# Patient Record
Sex: Male | Born: 1960 | Race: Black or African American | Hispanic: No | Marital: Married | Smoking: Never smoker
Health system: Southern US, Community
[De-identification: ages and names within clinical notes are randomized; demographics above are authoritative.]

## PROBLEM LIST (undated history)

## (undated) DIAGNOSIS — E785 Hyperlipidemia, unspecified: Secondary | ICD-10-CM

## (undated) DIAGNOSIS — R079 Chest pain, unspecified: Secondary | ICD-10-CM

## (undated) DIAGNOSIS — I5032 Chronic diastolic (congestive) heart failure: Secondary | ICD-10-CM

## (undated) DIAGNOSIS — N2581 Secondary hyperparathyroidism of renal origin: Secondary | ICD-10-CM

## (undated) DIAGNOSIS — J189 Pneumonia, unspecified organism: Secondary | ICD-10-CM

## (undated) DIAGNOSIS — I10 Essential (primary) hypertension: Secondary | ICD-10-CM

## (undated) DIAGNOSIS — J8489 Other specified interstitial pulmonary diseases: Secondary | ICD-10-CM

## (undated) DIAGNOSIS — G4733 Obstructive sleep apnea (adult) (pediatric): Secondary | ICD-10-CM

## (undated) DIAGNOSIS — D638 Anemia in other chronic diseases classified elsewhere: Secondary | ICD-10-CM

## (undated) DIAGNOSIS — N186 End stage renal disease: Secondary | ICD-10-CM

## (undated) DIAGNOSIS — E109 Type 1 diabetes mellitus without complications: Secondary | ICD-10-CM

## (undated) HISTORY — PX: OTHER SURGICAL HISTORY: SHX169

## (undated) HISTORY — PX: SHOULDER SURGERY: SHX246

## (undated) HISTORY — DX: Obstructive sleep apnea (adult) (pediatric): G47.33

## (undated) HISTORY — DX: End stage renal disease: N18.6

## (undated) HISTORY — DX: Hyperlipidemia, unspecified: E78.5

## (undated) HISTORY — PX: KNEE SURGERY: SHX244

## (undated) HISTORY — PX: GALLBLADDER SURGERY: SHX652

## (undated) HISTORY — PX: LUNG BIOPSY: SHX232

---

## 2004-04-21 ENCOUNTER — Other Ambulatory Visit: Payer: Self-pay

## 2006-03-08 ENCOUNTER — Emergency Department: Payer: Self-pay | Admitting: Emergency Medicine

## 2007-06-08 ENCOUNTER — Ambulatory Visit: Payer: Self-pay | Admitting: Internal Medicine

## 2007-06-13 ENCOUNTER — Emergency Department: Payer: Self-pay | Admitting: Internal Medicine

## 2007-06-15 ENCOUNTER — Inpatient Hospital Stay: Payer: Self-pay | Admitting: Surgery

## 2007-06-21 ENCOUNTER — Emergency Department: Payer: Self-pay | Admitting: Emergency Medicine

## 2008-06-15 ENCOUNTER — Ambulatory Visit: Payer: Self-pay | Admitting: Gastroenterology

## 2008-06-16 ENCOUNTER — Ambulatory Visit: Payer: Self-pay | Admitting: Gastroenterology

## 2008-12-13 ENCOUNTER — Observation Stay: Payer: Self-pay | Admitting: Internal Medicine

## 2009-01-09 ENCOUNTER — Ambulatory Visit: Payer: Self-pay | Admitting: Orthopedic Surgery

## 2009-12-20 ENCOUNTER — Observation Stay: Payer: Self-pay | Admitting: Internal Medicine

## 2010-09-06 ENCOUNTER — Emergency Department: Payer: Self-pay | Admitting: Emergency Medicine

## 2011-01-23 ENCOUNTER — Ambulatory Visit: Payer: Self-pay | Admitting: Sports Medicine

## 2011-11-25 ENCOUNTER — Ambulatory Visit: Payer: Self-pay | Admitting: Vascular Surgery

## 2011-11-25 LAB — BASIC METABOLIC PANEL
Anion Gap: 5 — ABNORMAL LOW (ref 7–16)
BUN: 54 mg/dL — ABNORMAL HIGH (ref 7–18)
Calcium, Total: 9.3 mg/dL (ref 8.5–10.1)
Co2: 28 mmol/L (ref 21–32)
EGFR (African American): 20 — ABNORMAL LOW
EGFR (Non-African Amer.): 16 — ABNORMAL LOW
Glucose: 239 mg/dL — ABNORMAL HIGH (ref 65–99)
Osmolality: 296 (ref 275–301)
Potassium: 4.7 mmol/L (ref 3.5–5.1)

## 2011-11-25 LAB — CBC
HGB: 12.2 g/dL — ABNORMAL LOW (ref 13.0–18.0)
MCH: 27.6 pg (ref 26.0–34.0)
MCV: 86 fL (ref 80–100)
RBC: 4.43 10*6/uL (ref 4.40–5.90)

## 2011-12-02 ENCOUNTER — Ambulatory Visit: Payer: Self-pay | Admitting: Vascular Surgery

## 2011-12-02 LAB — POTASSIUM: Potassium: 4.1 mmol/L (ref 3.5–5.1)

## 2011-12-07 ENCOUNTER — Emergency Department: Payer: Self-pay | Admitting: *Deleted

## 2011-12-07 LAB — CBC WITH DIFFERENTIAL/PLATELET
Basophil #: 0 10*3/uL (ref 0.0–0.1)
Basophil %: 0.1 %
Eosinophil #: 0.1 10*3/uL (ref 0.0–0.7)
HGB: 11.9 g/dL — ABNORMAL LOW (ref 13.0–18.0)
Lymphocyte %: 9.8 %
MCHC: 32.6 g/dL (ref 32.0–36.0)
MCV: 86 fL (ref 80–100)
Monocyte %: 3.4 %
Neutrophil #: 8.9 10*3/uL — ABNORMAL HIGH (ref 1.4–6.5)
Neutrophil %: 85.3 %
RBC: 4.25 10*6/uL — ABNORMAL LOW (ref 4.40–5.90)
RDW: 17 % — ABNORMAL HIGH (ref 11.5–14.5)

## 2011-12-07 LAB — COMPREHENSIVE METABOLIC PANEL
Alkaline Phosphatase: 145 U/L — ABNORMAL HIGH (ref 50–136)
BUN: 51 mg/dL — ABNORMAL HIGH (ref 7–18)
Bilirubin,Total: 0.2 mg/dL (ref 0.2–1.0)
Chloride: 103 mmol/L (ref 98–107)
Co2: 27 mmol/L (ref 21–32)
EGFR (African American): 23 — ABNORMAL LOW
Potassium: 5.1 mmol/L (ref 3.5–5.1)
SGOT(AST): 23 U/L (ref 15–37)
SGPT (ALT): 26 U/L
Sodium: 140 mmol/L (ref 136–145)
Total Protein: 7.1 g/dL (ref 6.4–8.2)

## 2011-12-13 LAB — CULTURE, BLOOD (SINGLE)

## 2012-05-04 ENCOUNTER — Ambulatory Visit: Payer: Self-pay | Admitting: Vascular Surgery

## 2012-06-11 ENCOUNTER — Ambulatory Visit: Payer: Self-pay | Admitting: Vascular Surgery

## 2012-08-12 ENCOUNTER — Ambulatory Visit: Payer: Self-pay | Admitting: Sports Medicine

## 2012-10-26 ENCOUNTER — Inpatient Hospital Stay: Payer: Self-pay | Admitting: Internal Medicine

## 2012-10-26 LAB — CBC WITH DIFFERENTIAL/PLATELET
Basophil %: 0.9 %
Eosinophil #: 0.2 10*3/uL (ref 0.0–0.7)
Lymphocyte #: 1.7 10*3/uL (ref 1.0–3.6)
Lymphocyte %: 14.7 %
MCH: 29.1 pg (ref 26.0–34.0)
Monocyte %: 3.6 %
Neutrophil #: 9 10*3/uL — ABNORMAL HIGH (ref 1.4–6.5)
RBC: 3.69 10*6/uL — ABNORMAL LOW (ref 4.40–5.90)
WBC: 11.4 10*3/uL — ABNORMAL HIGH (ref 3.8–10.6)

## 2012-10-26 LAB — URINALYSIS, COMPLETE
Bacteria: NONE SEEN
Bilirubin,UR: NEGATIVE
Glucose,UR: >=500 mg/dL (ref 0–75)
Hyaline Cast: 1
Leukocyte Esterase: NEGATIVE
Ph: 6 (ref 4.5–8.0)
RBC,UR: NONE SEEN /HPF (ref 0–5)
Specific Gravity: 1.013 (ref 1.003–1.030)
Squamous Epithelial: NONE SEEN
WBC UR: <1 /HPF (ref 0–5)

## 2012-10-26 LAB — COMPREHENSIVE METABOLIC PANEL
Albumin: 3.7 g/dL (ref 3.4–5.0)
Alkaline Phosphatase: 197 U/L — ABNORMAL HIGH (ref 50–136)
Anion Gap: 13 (ref 7–16)
BUN: 68 mg/dL — ABNORMAL HIGH (ref 7–18)
Co2: 19 mmol/L — ABNORMAL LOW (ref 21–32)
Creatinine: 5.31 mg/dL — ABNORMAL HIGH (ref 0.60–1.30)
Glucose: 778 mg/dL (ref 65–99)
Osmolality: 316 (ref 275–301)
Potassium: 5.8 mmol/L — ABNORMAL HIGH (ref 3.5–5.1)
SGOT(AST): 19 U/L (ref 15–37)
SGPT (ALT): 25 U/L (ref 12–78)

## 2012-10-26 LAB — BASIC METABOLIC PANEL
Anion Gap: 7 (ref 7–16)
BUN: 72 mg/dL — ABNORMAL HIGH (ref 7–18)
BUN: 74 mg/dL — ABNORMAL HIGH (ref 7–18)
Calcium, Total: 9 mg/dL (ref 8.5–10.1)
Calcium, Total: 8.9 mg/dL (ref 8.5–10.1)
Chloride: 105 mmol/L (ref 98–107)
Co2: 19 mmol/L — ABNORMAL LOW (ref 21–32)
Co2: 25 mmol/L (ref 21–32)
Creatinine: 5.14 mg/dL — ABNORMAL HIGH (ref 0.60–1.30)
EGFR (African American): 14 — ABNORMAL LOW
EGFR (African American): 14 — ABNORMAL LOW
EGFR (Non-African Amer.): 12 — ABNORMAL LOW
EGFR (Non-African Amer.): 12 — ABNORMAL LOW
Glucose: 186 mg/dL — ABNORMAL HIGH (ref 65–99)
Potassium: 5.6 mmol/L — ABNORMAL HIGH (ref 3.5–5.1)
Potassium: 4.8 mmol/L (ref 3.5–5.1)
Sodium: 137 mmol/L (ref 136–145)

## 2012-10-26 LAB — LIPASE, BLOOD: Lipase: 114 U/L (ref 73–393)

## 2012-10-27 LAB — BASIC METABOLIC PANEL
Anion Gap: 9 (ref 7–16)
BUN: 73 mg/dL — ABNORMAL HIGH (ref 7–18)
Calcium, Total: 8.6 mg/dL (ref 8.5–10.1)
EGFR (African American): 15 — ABNORMAL LOW
EGFR (Non-African Amer.): 13 — ABNORMAL LOW
Glucose: 227 mg/dL — ABNORMAL HIGH (ref 65–99)
Osmolality: 301 (ref 275–301)
Potassium: 4.8 mmol/L (ref 3.5–5.1)

## 2013-01-18 ENCOUNTER — Ambulatory Visit: Payer: Self-pay | Admitting: Vascular Surgery

## 2013-04-19 ENCOUNTER — Ambulatory Visit: Payer: Self-pay | Admitting: Vascular Surgery

## 2013-10-18 DIAGNOSIS — E119 Type 2 diabetes mellitus without complications: Secondary | ICD-10-CM | POA: Diagnosis not present

## 2013-11-01 DIAGNOSIS — K5289 Other specified noninfective gastroenteritis and colitis: Secondary | ICD-10-CM | POA: Diagnosis not present

## 2013-11-01 DIAGNOSIS — R509 Fever, unspecified: Secondary | ICD-10-CM | POA: Diagnosis not present

## 2013-11-01 DIAGNOSIS — R197 Diarrhea, unspecified: Secondary | ICD-10-CM | POA: Diagnosis not present

## 2013-11-01 DIAGNOSIS — R112 Nausea with vomiting, unspecified: Secondary | ICD-10-CM | POA: Diagnosis not present

## 2013-11-10 DIAGNOSIS — R439 Unspecified disturbances of smell and taste: Secondary | ICD-10-CM | POA: Diagnosis not present

## 2013-11-10 DIAGNOSIS — E669 Obesity, unspecified: Secondary | ICD-10-CM | POA: Diagnosis not present

## 2013-11-10 DIAGNOSIS — R262 Difficulty in walking, not elsewhere classified: Secondary | ICD-10-CM | POA: Diagnosis not present

## 2013-11-10 DIAGNOSIS — R51 Headache: Secondary | ICD-10-CM | POA: Diagnosis not present

## 2013-12-10 ENCOUNTER — Emergency Department: Payer: Self-pay | Admitting: Emergency Medicine

## 2013-12-10 DIAGNOSIS — G4730 Sleep apnea, unspecified: Secondary | ICD-10-CM | POA: Diagnosis not present

## 2013-12-10 DIAGNOSIS — I10 Essential (primary) hypertension: Secondary | ICD-10-CM | POA: Diagnosis not present

## 2013-12-10 DIAGNOSIS — N189 Chronic kidney disease, unspecified: Secondary | ICD-10-CM | POA: Diagnosis not present

## 2013-12-10 DIAGNOSIS — Z9889 Other specified postprocedural states: Secondary | ICD-10-CM | POA: Diagnosis not present

## 2013-12-10 DIAGNOSIS — Z794 Long term (current) use of insulin: Secondary | ICD-10-CM | POA: Diagnosis not present

## 2013-12-10 DIAGNOSIS — E1142 Type 2 diabetes mellitus with diabetic polyneuropathy: Secondary | ICD-10-CM | POA: Diagnosis not present

## 2013-12-10 DIAGNOSIS — I509 Heart failure, unspecified: Secondary | ICD-10-CM | POA: Diagnosis not present

## 2013-12-10 DIAGNOSIS — J029 Acute pharyngitis, unspecified: Secondary | ICD-10-CM | POA: Diagnosis not present

## 2013-12-10 DIAGNOSIS — Z8249 Family history of ischemic heart disease and other diseases of the circulatory system: Secondary | ICD-10-CM | POA: Diagnosis not present

## 2013-12-10 DIAGNOSIS — I2510 Atherosclerotic heart disease of native coronary artery without angina pectoris: Secondary | ICD-10-CM | POA: Diagnosis not present

## 2013-12-10 DIAGNOSIS — J019 Acute sinusitis, unspecified: Secondary | ICD-10-CM | POA: Diagnosis not present

## 2013-12-10 DIAGNOSIS — R509 Fever, unspecified: Secondary | ICD-10-CM | POA: Diagnosis not present

## 2013-12-10 DIAGNOSIS — Z833 Family history of diabetes mellitus: Secondary | ICD-10-CM | POA: Diagnosis not present

## 2013-12-10 DIAGNOSIS — E1149 Type 2 diabetes mellitus with other diabetic neurological complication: Secondary | ICD-10-CM | POA: Diagnosis not present

## 2013-12-10 DIAGNOSIS — Z9089 Acquired absence of other organs: Secondary | ICD-10-CM | POA: Diagnosis not present

## 2013-12-11 ENCOUNTER — Emergency Department: Payer: Self-pay | Admitting: Emergency Medicine

## 2013-12-11 DIAGNOSIS — Z992 Dependence on renal dialysis: Secondary | ICD-10-CM | POA: Diagnosis not present

## 2013-12-11 DIAGNOSIS — J029 Acute pharyngitis, unspecified: Secondary | ICD-10-CM | POA: Diagnosis not present

## 2013-12-11 DIAGNOSIS — I2510 Atherosclerotic heart disease of native coronary artery without angina pectoris: Secondary | ICD-10-CM | POA: Diagnosis not present

## 2013-12-11 DIAGNOSIS — E119 Type 2 diabetes mellitus without complications: Secondary | ICD-10-CM | POA: Diagnosis not present

## 2013-12-11 DIAGNOSIS — I10 Essential (primary) hypertension: Secondary | ICD-10-CM | POA: Diagnosis not present

## 2013-12-11 DIAGNOSIS — N186 End stage renal disease: Secondary | ICD-10-CM | POA: Diagnosis not present

## 2013-12-11 LAB — COMPREHENSIVE METABOLIC PANEL
ALT: 170 U/L — AB (ref 12–78)
AST: 159 U/L — AB (ref 15–37)
Albumin: 2.5 g/dL — ABNORMAL LOW (ref 3.4–5.0)
Alkaline Phosphatase: 308 U/L — ABNORMAL HIGH
Anion Gap: 10 (ref 7–16)
BUN: 38 mg/dL — AB (ref 7–18)
Bilirubin,Total: 1.1 mg/dL — ABNORMAL HIGH (ref 0.2–1.0)
CALCIUM: 8.6 mg/dL (ref 8.5–10.1)
CO2: 28 mmol/L (ref 21–32)
Chloride: 93 mmol/L — ABNORMAL LOW (ref 98–107)
Creatinine: 7.35 mg/dL — ABNORMAL HIGH (ref 0.60–1.30)
EGFR (Non-African Amer.): 8 — ABNORMAL LOW
GFR CALC AF AMER: 9 — AB
GLUCOSE: 375 mg/dL — AB (ref 65–99)
OSMOLALITY: 287 (ref 275–301)
Potassium: 4.2 mmol/L (ref 3.5–5.1)
Sodium: 131 mmol/L — ABNORMAL LOW (ref 136–145)
Total Protein: 7 g/dL (ref 6.4–8.2)

## 2013-12-11 LAB — CBC WITH DIFFERENTIAL/PLATELET
BASOS ABS: 0.1 10*3/uL (ref 0.0–0.1)
Basophil %: 0.5 %
EOS PCT: 0 %
Eosinophil #: 0 10*3/uL (ref 0.0–0.7)
HCT: 31.8 % — AB (ref 40.0–52.0)
HGB: 10.3 g/dL — AB (ref 13.0–18.0)
LYMPHS PCT: 2.3 %
Lymphocyte #: 0.6 10*3/uL — ABNORMAL LOW (ref 1.0–3.6)
MCH: 28.5 pg (ref 26.0–34.0)
MCHC: 32.3 g/dL (ref 32.0–36.0)
MCV: 88 fL (ref 80–100)
Monocyte #: 1.8 x10 3/mm — ABNORMAL HIGH (ref 0.2–1.0)
Monocyte %: 7.1 %
NEUTROS ABS: 22.7 10*3/uL — AB (ref 1.4–6.5)
NEUTROS PCT: 90.1 %
Platelet: 303 10*3/uL (ref 150–440)
RBC: 3.61 10*6/uL — ABNORMAL LOW (ref 4.40–5.90)
RDW: 16.5 % — ABNORMAL HIGH (ref 11.5–14.5)
WBC: 25.2 10*3/uL — ABNORMAL HIGH (ref 3.8–10.6)

## 2013-12-12 ENCOUNTER — Inpatient Hospital Stay: Payer: Self-pay | Admitting: Internal Medicine

## 2013-12-12 DIAGNOSIS — E559 Vitamin D deficiency, unspecified: Secondary | ICD-10-CM | POA: Diagnosis present

## 2013-12-12 DIAGNOSIS — R7881 Bacteremia: Secondary | ICD-10-CM | POA: Diagnosis not present

## 2013-12-12 DIAGNOSIS — H409 Unspecified glaucoma: Secondary | ICD-10-CM | POA: Diagnosis present

## 2013-12-12 DIAGNOSIS — T827XXA Infection and inflammatory reaction due to other cardiac and vascular devices, implants and grafts, initial encounter: Secondary | ICD-10-CM | POA: Diagnosis present

## 2013-12-12 DIAGNOSIS — N186 End stage renal disease: Secondary | ICD-10-CM | POA: Diagnosis present

## 2013-12-12 DIAGNOSIS — R079 Chest pain, unspecified: Secondary | ICD-10-CM | POA: Diagnosis not present

## 2013-12-12 DIAGNOSIS — R5383 Other fatigue: Secondary | ICD-10-CM | POA: Diagnosis not present

## 2013-12-12 DIAGNOSIS — E1142 Type 2 diabetes mellitus with diabetic polyneuropathy: Secondary | ICD-10-CM | POA: Diagnosis present

## 2013-12-12 DIAGNOSIS — E11319 Type 2 diabetes mellitus with unspecified diabetic retinopathy without macular edema: Secondary | ICD-10-CM | POA: Diagnosis present

## 2013-12-12 DIAGNOSIS — R5381 Other malaise: Secondary | ICD-10-CM | POA: Diagnosis not present

## 2013-12-12 DIAGNOSIS — F329 Major depressive disorder, single episode, unspecified: Secondary | ICD-10-CM | POA: Diagnosis present

## 2013-12-12 DIAGNOSIS — A4102 Sepsis due to Methicillin resistant Staphylococcus aureus: Secondary | ICD-10-CM | POA: Diagnosis present

## 2013-12-12 DIAGNOSIS — I509 Heart failure, unspecified: Secondary | ICD-10-CM | POA: Diagnosis present

## 2013-12-12 DIAGNOSIS — F3289 Other specified depressive episodes: Secondary | ICD-10-CM | POA: Diagnosis present

## 2013-12-12 DIAGNOSIS — R509 Fever, unspecified: Secondary | ICD-10-CM | POA: Diagnosis not present

## 2013-12-12 DIAGNOSIS — J392 Other diseases of pharynx: Secondary | ICD-10-CM | POA: Diagnosis not present

## 2013-12-12 DIAGNOSIS — G4733 Obstructive sleep apnea (adult) (pediatric): Secondary | ICD-10-CM | POA: Diagnosis present

## 2013-12-12 DIAGNOSIS — J984 Other disorders of lung: Secondary | ICD-10-CM | POA: Diagnosis not present

## 2013-12-12 DIAGNOSIS — I5030 Unspecified diastolic (congestive) heart failure: Secondary | ICD-10-CM | POA: Diagnosis present

## 2013-12-12 DIAGNOSIS — F411 Generalized anxiety disorder: Secondary | ICD-10-CM | POA: Diagnosis present

## 2013-12-12 DIAGNOSIS — I2510 Atherosclerotic heart disease of native coronary artery without angina pectoris: Secondary | ICD-10-CM | POA: Diagnosis present

## 2013-12-12 DIAGNOSIS — J390 Retropharyngeal and parapharyngeal abscess: Secondary | ICD-10-CM | POA: Diagnosis present

## 2013-12-12 DIAGNOSIS — Z538 Procedure and treatment not carried out for other reasons: Secondary | ICD-10-CM | POA: Diagnosis not present

## 2013-12-12 DIAGNOSIS — K208 Other esophagitis without bleeding: Secondary | ICD-10-CM | POA: Diagnosis not present

## 2013-12-12 DIAGNOSIS — K219 Gastro-esophageal reflux disease without esophagitis: Secondary | ICD-10-CM | POA: Diagnosis present

## 2013-12-12 DIAGNOSIS — A419 Sepsis, unspecified organism: Secondary | ICD-10-CM | POA: Diagnosis not present

## 2013-12-12 DIAGNOSIS — R6520 Severe sepsis without septic shock: Secondary | ICD-10-CM | POA: Diagnosis present

## 2013-12-12 DIAGNOSIS — R404 Transient alteration of awareness: Secondary | ICD-10-CM | POA: Diagnosis not present

## 2013-12-12 DIAGNOSIS — M109 Gout, unspecified: Secondary | ICD-10-CM | POA: Diagnosis present

## 2013-12-12 DIAGNOSIS — M542 Cervicalgia: Secondary | ICD-10-CM | POA: Diagnosis not present

## 2013-12-12 DIAGNOSIS — I120 Hypertensive chronic kidney disease with stage 5 chronic kidney disease or end stage renal disease: Secondary | ICD-10-CM | POA: Diagnosis present

## 2013-12-12 DIAGNOSIS — F40298 Other specified phobia: Secondary | ICD-10-CM | POA: Diagnosis present

## 2013-12-12 DIAGNOSIS — G9341 Metabolic encephalopathy: Secondary | ICD-10-CM | POA: Diagnosis not present

## 2013-12-12 DIAGNOSIS — E785 Hyperlipidemia, unspecified: Secondary | ICD-10-CM | POA: Diagnosis present

## 2013-12-12 DIAGNOSIS — J029 Acute pharyngitis, unspecified: Secondary | ICD-10-CM | POA: Diagnosis not present

## 2013-12-12 DIAGNOSIS — R1310 Dysphagia, unspecified: Secondary | ICD-10-CM | POA: Diagnosis not present

## 2013-12-12 DIAGNOSIS — E039 Hypothyroidism, unspecified: Secondary | ICD-10-CM | POA: Diagnosis present

## 2013-12-12 DIAGNOSIS — R112 Nausea with vomiting, unspecified: Secondary | ICD-10-CM | POA: Diagnosis not present

## 2013-12-12 DIAGNOSIS — T8571XA Infection and inflammatory reaction due to peritoneal dialysis catheter, initial encounter: Secondary | ICD-10-CM | POA: Diagnosis not present

## 2013-12-12 LAB — COMPREHENSIVE METABOLIC PANEL
ALK PHOS: 282 U/L — AB
Albumin: 2.3 g/dL — ABNORMAL LOW (ref 3.4–5.0)
Anion Gap: 6 — ABNORMAL LOW (ref 7–16)
BILIRUBIN TOTAL: 0.5 mg/dL (ref 0.2–1.0)
BUN: 28 mg/dL — ABNORMAL HIGH (ref 7–18)
Calcium, Total: 9 mg/dL (ref 8.5–10.1)
Chloride: 96 mmol/L — ABNORMAL LOW (ref 98–107)
Co2: 33 mmol/L — ABNORMAL HIGH (ref 21–32)
Creatinine: 5.03 mg/dL — ABNORMAL HIGH (ref 0.60–1.30)
EGFR (African American): 14 — ABNORMAL LOW
GFR CALC NON AF AMER: 12 — AB
Glucose: 284 mg/dL — ABNORMAL HIGH (ref 65–99)
Osmolality: 286 (ref 275–301)
POTASSIUM: 3.8 mmol/L (ref 3.5–5.1)
SGOT(AST): 48 U/L — ABNORMAL HIGH (ref 15–37)
SGPT (ALT): 121 U/L — ABNORMAL HIGH (ref 12–78)
SODIUM: 135 mmol/L — AB (ref 136–145)
Total Protein: 7.5 g/dL (ref 6.4–8.2)

## 2013-12-12 LAB — URINALYSIS, COMPLETE
Bilirubin,UR: NEGATIVE
Granular Cast: 6
Ketone: NEGATIVE
LEUKOCYTE ESTERASE: NEGATIVE
NITRITE: NEGATIVE
PH: 5 (ref 4.5–8.0)
Protein: >=500
RBC,UR: 8 /HPF (ref 0–5)
Specific Gravity: 1.029 (ref 1.003–1.030)
Squamous Epithelial: NONE SEEN

## 2013-12-12 LAB — CBC
HCT: 31.6 % — ABNORMAL LOW (ref 40.0–52.0)
HGB: 10.4 g/dL — AB (ref 13.0–18.0)
MCH: 28.8 pg (ref 26.0–34.0)
MCHC: 33 g/dL (ref 32.0–36.0)
MCV: 87 fL (ref 80–100)
PLATELETS: 308 10*3/uL (ref 150–440)
RBC: 3.62 10*6/uL — ABNORMAL LOW (ref 4.40–5.90)
RDW: 17.3 % — ABNORMAL HIGH (ref 11.5–14.5)
WBC: 32.1 10*3/uL — AB (ref 3.8–10.6)

## 2013-12-13 LAB — BASIC METABOLIC PANEL
Anion Gap: 12 (ref 7–16)
BUN: 55 mg/dL — AB (ref 7–18)
CALCIUM: 8.7 mg/dL (ref 8.5–10.1)
CO2: 27 mmol/L (ref 21–32)
CREATININE: 7.73 mg/dL — AB (ref 0.60–1.30)
Chloride: 91 mmol/L — ABNORMAL LOW (ref 98–107)
EGFR (African American): 9 — ABNORMAL LOW
EGFR (Non-African Amer.): 7 — ABNORMAL LOW
Glucose: 498 mg/dL — ABNORMAL HIGH (ref 65–99)
OSMOLALITY: 298 (ref 275–301)
Potassium: 4.7 mmol/L (ref 3.5–5.1)
Sodium: 130 mmol/L — ABNORMAL LOW (ref 136–145)

## 2013-12-13 LAB — CBC WITH DIFFERENTIAL/PLATELET
Basophil #: 0.1 10*3/uL (ref 0.0–0.1)
Basophil %: 0.3 %
EOS ABS: 0 10*3/uL (ref 0.0–0.7)
EOS PCT: 0 %
HCT: 29.7 % — AB (ref 40.0–52.0)
HGB: 9.7 g/dL — ABNORMAL LOW (ref 13.0–18.0)
Lymphocyte #: 0.9 10*3/uL — ABNORMAL LOW (ref 1.0–3.6)
Lymphocyte %: 2.8 %
MCH: 29 pg (ref 26.0–34.0)
MCHC: 32.5 g/dL (ref 32.0–36.0)
MCV: 89 fL (ref 80–100)
MONOS PCT: 4.7 %
Monocyte #: 1.5 x10 3/mm — ABNORMAL HIGH (ref 0.2–1.0)
NEUTROS ABS: 29.3 10*3/uL — AB (ref 1.4–6.5)
Neutrophil %: 92.2 %
Platelet: 329 10*3/uL (ref 150–440)
RBC: 3.33 10*6/uL — AB (ref 4.40–5.90)
RDW: 16.7 % — ABNORMAL HIGH (ref 11.5–14.5)
WBC: 31.7 10*3/uL — ABNORMAL HIGH (ref 3.8–10.6)

## 2013-12-13 LAB — BETA STREP CULTURE(ARMC)

## 2013-12-14 DIAGNOSIS — R079 Chest pain, unspecified: Secondary | ICD-10-CM

## 2013-12-14 LAB — CBC WITH DIFFERENTIAL/PLATELET
Basophil #: 0.1 10*3/uL (ref 0.0–0.1)
Basophil %: 0.4 %
EOS PCT: 0.3 %
Eosinophil #: 0 10*3/uL (ref 0.0–0.7)
HCT: 36.3 % — ABNORMAL LOW (ref 40.0–52.0)
HGB: 12 g/dL — ABNORMAL LOW (ref 13.0–18.0)
LYMPHS ABS: 1 10*3/uL (ref 1.0–3.6)
LYMPHS PCT: 5.5 %
MCH: 27.6 pg (ref 26.0–34.0)
MCHC: 33 g/dL (ref 32.0–36.0)
MCV: 84 fL (ref 80–100)
MONO ABS: 1.8 x10 3/mm — AB (ref 0.2–1.0)
MONOS PCT: 9.9 %
NEUTROS ABS: 15 10*3/uL — AB (ref 1.4–6.5)
Neutrophil %: 83.9 %
Platelet: 265 10*3/uL (ref 150–440)
RBC: 4.34 10*6/uL — AB (ref 4.40–5.90)
RDW: 15.6 % — ABNORMAL HIGH (ref 11.5–14.5)
WBC: 17.9 10*3/uL — ABNORMAL HIGH (ref 3.8–10.6)

## 2013-12-14 LAB — URINE CULTURE

## 2013-12-14 LAB — PHOSPHORUS: Phosphorus: 6.7 mg/dL — ABNORMAL HIGH (ref 2.5–4.9)

## 2013-12-14 LAB — TROPONIN I: Troponin-I: 0.09 ng/mL — ABNORMAL HIGH

## 2013-12-15 LAB — CBC WITH DIFFERENTIAL/PLATELET
BASOS ABS: 0 10*3/uL (ref 0.0–0.1)
Basophil %: 0.2 %
Eosinophil #: 0 10*3/uL (ref 0.0–0.7)
Eosinophil %: 0.2 %
HCT: 31 % — ABNORMAL LOW (ref 40.0–52.0)
HGB: 10.2 g/dL — AB (ref 13.0–18.0)
LYMPHS PCT: 7.8 %
Lymphocyte #: 1.6 10*3/uL (ref 1.0–3.6)
MCH: 28.9 pg (ref 26.0–34.0)
MCHC: 32.8 g/dL (ref 32.0–36.0)
MCV: 88 fL (ref 80–100)
MONO ABS: 2.3 x10 3/mm — AB (ref 0.2–1.0)
MONOS PCT: 11.2 %
Neutrophil #: 16.7 10*3/uL — ABNORMAL HIGH (ref 1.4–6.5)
Neutrophil %: 80.6 %
Platelet: 386 10*3/uL (ref 150–440)
RBC: 3.51 10*6/uL — ABNORMAL LOW (ref 4.40–5.90)
RDW: 16.9 % — AB (ref 11.5–14.5)
WBC: 20.8 10*3/uL — ABNORMAL HIGH (ref 3.8–10.6)

## 2013-12-15 LAB — PHOSPHORUS: Phosphorus: 7.1 mg/dL — ABNORMAL HIGH (ref 2.5–4.9)

## 2013-12-16 LAB — CBC WITH DIFFERENTIAL/PLATELET
BANDS NEUTROPHIL: 2 %
Comment - H1-Com4: NORMAL
Eosinophil: 1 %
HCT: 30.1 % — AB (ref 40.0–52.0)
HGB: 9.8 g/dL — ABNORMAL LOW (ref 13.0–18.0)
Lymphocytes: 16 %
MCH: 29.2 pg (ref 26.0–34.0)
MCHC: 32.7 g/dL (ref 32.0–36.0)
MCV: 89 fL (ref 80–100)
MONOS PCT: 12 %
Metamyelocyte: 1 %
Myelocyte: 1 %
NRBC/100 WBC: 1 /
PLATELETS: 381 10*3/uL (ref 150–440)
RBC: 3.37 10*6/uL — AB (ref 4.40–5.90)
RDW: 16.9 % — AB (ref 11.5–14.5)
Segmented Neutrophils: 67 %
WBC: 19.3 10*3/uL — ABNORMAL HIGH (ref 3.8–10.6)

## 2013-12-16 LAB — CULTURE, BLOOD (SINGLE)

## 2013-12-16 LAB — PHOSPHORUS: Phosphorus: 6.9 mg/dL — ABNORMAL HIGH (ref 2.5–4.9)

## 2013-12-17 LAB — CULTURE, BLOOD (SINGLE)

## 2013-12-18 LAB — CBC WITH DIFFERENTIAL/PLATELET
BASOS ABS: 0.1 10*3/uL (ref 0.0–0.1)
BASOS PCT: 0.5 %
Eosinophil #: 0.4 10*3/uL (ref 0.0–0.7)
Eosinophil %: 2.3 %
HCT: 30.2 % — ABNORMAL LOW (ref 40.0–52.0)
HGB: 9.6 g/dL — ABNORMAL LOW (ref 13.0–18.0)
LYMPHS ABS: 2.1 10*3/uL (ref 1.0–3.6)
Lymphocyte %: 11.6 %
MCH: 28.2 pg (ref 26.0–34.0)
MCHC: 31.8 g/dL — AB (ref 32.0–36.0)
MCV: 89 fL (ref 80–100)
Monocyte #: 1.5 x10 3/mm — ABNORMAL HIGH (ref 0.2–1.0)
Monocyte %: 8.3 %
NEUTROS ABS: 14 10*3/uL — AB (ref 1.4–6.5)
Neutrophil %: 77.3 %
Platelet: 520 10*3/uL — ABNORMAL HIGH (ref 150–440)
RBC: 3.4 10*6/uL — ABNORMAL LOW (ref 4.40–5.90)
RDW: 17.1 % — AB (ref 11.5–14.5)
WBC: 18.1 10*3/uL — ABNORMAL HIGH (ref 3.8–10.6)

## 2013-12-18 LAB — VANCOMYCIN, TROUGH: Vancomycin, Trough: 13 ug/mL (ref 10–20)

## 2013-12-19 LAB — RENAL FUNCTION PANEL
Albumin: 2 g/dL — ABNORMAL LOW (ref 3.4–5.0)
Anion Gap: 12 (ref 7–16)
BUN: 74 mg/dL — ABNORMAL HIGH (ref 7–18)
CHLORIDE: 94 mmol/L — AB (ref 98–107)
CREATININE: 11.76 mg/dL — AB (ref 0.60–1.30)
Calcium, Total: 8.8 mg/dL (ref 8.5–10.1)
Co2: 28 mmol/L (ref 21–32)
GFR CALC AF AMER: 5 — AB
GFR CALC NON AF AMER: 4 — AB
Glucose: 277 mg/dL — ABNORMAL HIGH (ref 65–99)
Osmolality: 300 (ref 275–301)
Phosphorus: 7.9 mg/dL — ABNORMAL HIGH (ref 2.5–4.9)
Potassium: 3.9 mmol/L (ref 3.5–5.1)
SODIUM: 134 mmol/L — AB (ref 136–145)

## 2013-12-19 LAB — CBC WITH DIFFERENTIAL/PLATELET
BASOS ABS: 0.1 10*3/uL (ref 0.0–0.1)
Basophil %: 0.4 %
Eosinophil #: 0.3 10*3/uL (ref 0.0–0.7)
Eosinophil %: 1.9 %
HCT: 27.9 % — ABNORMAL LOW (ref 40.0–52.0)
HGB: 9.3 g/dL — ABNORMAL LOW (ref 13.0–18.0)
LYMPHS PCT: 9.2 %
Lymphocyte #: 1.2 10*3/uL (ref 1.0–3.6)
MCH: 29.5 pg (ref 26.0–34.0)
MCHC: 33.5 g/dL (ref 32.0–36.0)
MCV: 88 fL (ref 80–100)
Monocyte #: 0.9 x10 3/mm (ref 0.2–1.0)
Monocyte %: 6.6 %
NEUTROS ABS: 10.6 10*3/uL — AB (ref 1.4–6.5)
Neutrophil %: 81.9 %
Platelet: 521 10*3/uL — ABNORMAL HIGH (ref 150–440)
RBC: 3.17 10*6/uL — ABNORMAL LOW (ref 4.40–5.90)
RDW: 16.5 % — AB (ref 11.5–14.5)
WBC: 13 10*3/uL — ABNORMAL HIGH (ref 3.8–10.6)

## 2013-12-19 LAB — TSH: Thyroid Stimulating Horm: 0.017 u[IU]/mL — ABNORMAL LOW

## 2013-12-20 LAB — CBC WITH DIFFERENTIAL/PLATELET
BASOS PCT: 0.7 %
Basophil #: 0.1 10*3/uL (ref 0.0–0.1)
Eosinophil #: 0.2 10*3/uL (ref 0.0–0.7)
Eosinophil %: 1.5 %
HCT: 30.8 % — AB (ref 40.0–52.0)
HGB: 9.5 g/dL — ABNORMAL LOW (ref 13.0–18.0)
Lymphocyte #: 1.4 10*3/uL (ref 1.0–3.6)
Lymphocyte %: 10.7 %
MCH: 27.5 pg (ref 26.0–34.0)
MCHC: 31 g/dL — AB (ref 32.0–36.0)
MCV: 89 fL (ref 80–100)
MONO ABS: 0.9 x10 3/mm (ref 0.2–1.0)
Monocyte %: 6.9 %
NEUTROS PCT: 80.2 %
Neutrophil #: 10.7 10*3/uL — ABNORMAL HIGH (ref 1.4–6.5)
PLATELETS: 589 10*3/uL — AB (ref 150–440)
RBC: 3.47 10*6/uL — AB (ref 4.40–5.90)
RDW: 17 % — AB (ref 11.5–14.5)
WBC: 13.3 10*3/uL — AB (ref 3.8–10.6)

## 2013-12-20 LAB — COMPREHENSIVE METABOLIC PANEL
ALK PHOS: 293 U/L — AB
AST: 29 U/L (ref 15–37)
Albumin: 2 g/dL — ABNORMAL LOW (ref 3.4–5.0)
Anion Gap: 10 (ref 7–16)
BUN: 40 mg/dL — ABNORMAL HIGH (ref 7–18)
Bilirubin,Total: 0.5 mg/dL (ref 0.2–1.0)
Calcium, Total: 8.9 mg/dL (ref 8.5–10.1)
Chloride: 99 mmol/L (ref 98–107)
Co2: 29 mmol/L (ref 21–32)
Creatinine: 8.5 mg/dL — ABNORMAL HIGH (ref 0.60–1.30)
EGFR (African American): 8 — ABNORMAL LOW
EGFR (Non-African Amer.): 7 — ABNORMAL LOW
GLUCOSE: 207 mg/dL — AB (ref 65–99)
OSMOLALITY: 291 (ref 275–301)
Potassium: 3.9 mmol/L (ref 3.5–5.1)
SGPT (ALT): 29 U/L (ref 12–78)
SODIUM: 138 mmol/L (ref 136–145)
TOTAL PROTEIN: 7.6 g/dL (ref 6.4–8.2)

## 2013-12-20 LAB — MISC AER/ANAEROBIC CULT.

## 2013-12-20 LAB — PROTIME-INR
INR: 1.1
Prothrombin Time: 13.6 secs (ref 11.5–14.7)

## 2013-12-21 LAB — PHOSPHORUS: Phosphorus: 8.3 mg/dL — ABNORMAL HIGH (ref 2.5–4.9)

## 2013-12-21 LAB — VANCOMYCIN, TROUGH: Vancomycin, Trough: 25 ug/mL (ref 10–20)

## 2013-12-22 LAB — CBC WITH DIFFERENTIAL/PLATELET
BASOS PCT: 1 %
Basophil #: 0.1 10*3/uL (ref 0.0–0.1)
EOS PCT: 1.6 %
Eosinophil #: 0.2 10*3/uL (ref 0.0–0.7)
HCT: 28.6 % — ABNORMAL LOW (ref 40.0–52.0)
HGB: 9.2 g/dL — AB (ref 13.0–18.0)
LYMPHS ABS: 1.5 10*3/uL (ref 1.0–3.6)
LYMPHS PCT: 12.6 %
MCH: 29 pg (ref 26.0–34.0)
MCHC: 32.2 g/dL (ref 32.0–36.0)
MCV: 90 fL (ref 80–100)
MONO ABS: 1 x10 3/mm (ref 0.2–1.0)
MONOS PCT: 8.3 %
Neutrophil #: 8.9 10*3/uL — ABNORMAL HIGH (ref 1.4–6.5)
Neutrophil %: 76.5 %
Platelet: 507 10*3/uL — ABNORMAL HIGH (ref 150–440)
RBC: 3.18 10*6/uL — ABNORMAL LOW (ref 4.40–5.90)
RDW: 16.3 % — ABNORMAL HIGH (ref 11.5–14.5)
WBC: 11.7 10*3/uL — ABNORMAL HIGH (ref 3.8–10.6)

## 2013-12-23 DIAGNOSIS — M542 Cervicalgia: Secondary | ICD-10-CM | POA: Diagnosis not present

## 2013-12-23 DIAGNOSIS — J390 Retropharyngeal and parapharyngeal abscess: Secondary | ICD-10-CM | POA: Diagnosis not present

## 2013-12-23 DIAGNOSIS — R112 Nausea with vomiting, unspecified: Secondary | ICD-10-CM | POA: Diagnosis not present

## 2013-12-23 DIAGNOSIS — R5381 Other malaise: Secondary | ICD-10-CM | POA: Diagnosis not present

## 2013-12-23 DIAGNOSIS — R509 Fever, unspecified: Secondary | ICD-10-CM | POA: Diagnosis not present

## 2013-12-23 LAB — POTASSIUM: Potassium: 3.5 mmol/L (ref 3.5–5.1)

## 2013-12-24 DIAGNOSIS — E1010 Type 1 diabetes mellitus with ketoacidosis without coma: Secondary | ICD-10-CM | POA: Diagnosis not present

## 2013-12-24 DIAGNOSIS — Z452 Encounter for adjustment and management of vascular access device: Secondary | ICD-10-CM | POA: Diagnosis not present

## 2013-12-24 DIAGNOSIS — J390 Retropharyngeal and parapharyngeal abscess: Secondary | ICD-10-CM | POA: Diagnosis not present

## 2013-12-24 DIAGNOSIS — E109 Type 1 diabetes mellitus without complications: Secondary | ICD-10-CM | POA: Diagnosis not present

## 2013-12-24 DIAGNOSIS — R7881 Bacteremia: Secondary | ICD-10-CM | POA: Diagnosis not present

## 2013-12-24 DIAGNOSIS — I517 Cardiomegaly: Secondary | ICD-10-CM | POA: Diagnosis not present

## 2013-12-24 DIAGNOSIS — R918 Other nonspecific abnormal finding of lung field: Secondary | ICD-10-CM | POA: Diagnosis not present

## 2013-12-25 DIAGNOSIS — R7881 Bacteremia: Secondary | ICD-10-CM | POA: Diagnosis not present

## 2013-12-25 DIAGNOSIS — I517 Cardiomegaly: Secondary | ICD-10-CM | POA: Diagnosis not present

## 2013-12-25 DIAGNOSIS — Z452 Encounter for adjustment and management of vascular access device: Secondary | ICD-10-CM | POA: Diagnosis not present

## 2013-12-25 DIAGNOSIS — E1010 Type 1 diabetes mellitus with ketoacidosis without coma: Secondary | ICD-10-CM | POA: Diagnosis not present

## 2013-12-25 DIAGNOSIS — R918 Other nonspecific abnormal finding of lung field: Secondary | ICD-10-CM | POA: Diagnosis not present

## 2013-12-25 DIAGNOSIS — E109 Type 1 diabetes mellitus without complications: Secondary | ICD-10-CM | POA: Diagnosis not present

## 2013-12-25 DIAGNOSIS — J390 Retropharyngeal and parapharyngeal abscess: Secondary | ICD-10-CM | POA: Diagnosis not present

## 2013-12-26 DIAGNOSIS — N186 End stage renal disease: Secondary | ICD-10-CM | POA: Diagnosis not present

## 2013-12-26 DIAGNOSIS — E109 Type 1 diabetes mellitus without complications: Secondary | ICD-10-CM | POA: Diagnosis not present

## 2013-12-26 DIAGNOSIS — A4902 Methicillin resistant Staphylococcus aureus infection, unspecified site: Secondary | ICD-10-CM | POA: Diagnosis not present

## 2013-12-26 DIAGNOSIS — N184 Chronic kidney disease, stage 4 (severe): Secondary | ICD-10-CM | POA: Diagnosis not present

## 2013-12-26 DIAGNOSIS — J390 Retropharyngeal and parapharyngeal abscess: Secondary | ICD-10-CM | POA: Diagnosis not present

## 2013-12-26 DIAGNOSIS — R7881 Bacteremia: Secondary | ICD-10-CM | POA: Diagnosis not present

## 2013-12-27 DIAGNOSIS — N184 Chronic kidney disease, stage 4 (severe): Secondary | ICD-10-CM | POA: Diagnosis not present

## 2013-12-27 DIAGNOSIS — Z992 Dependence on renal dialysis: Secondary | ICD-10-CM | POA: Diagnosis not present

## 2013-12-27 DIAGNOSIS — J390 Retropharyngeal and parapharyngeal abscess: Secondary | ICD-10-CM | POA: Diagnosis not present

## 2013-12-27 DIAGNOSIS — E1010 Type 1 diabetes mellitus with ketoacidosis without coma: Secondary | ICD-10-CM | POA: Diagnosis not present

## 2013-12-27 DIAGNOSIS — R1312 Dysphagia, oropharyngeal phase: Secondary | ICD-10-CM | POA: Diagnosis not present

## 2013-12-27 DIAGNOSIS — N186 End stage renal disease: Secondary | ICD-10-CM | POA: Diagnosis not present

## 2013-12-27 DIAGNOSIS — R7881 Bacteremia: Secondary | ICD-10-CM | POA: Diagnosis not present

## 2013-12-27 DIAGNOSIS — E109 Type 1 diabetes mellitus without complications: Secondary | ICD-10-CM | POA: Diagnosis not present

## 2013-12-28 DIAGNOSIS — A4902 Methicillin resistant Staphylococcus aureus infection, unspecified site: Secondary | ICD-10-CM | POA: Diagnosis not present

## 2013-12-28 DIAGNOSIS — R1312 Dysphagia, oropharyngeal phase: Secondary | ICD-10-CM | POA: Diagnosis not present

## 2013-12-28 DIAGNOSIS — R0989 Other specified symptoms and signs involving the circulatory and respiratory systems: Secondary | ICD-10-CM | POA: Diagnosis not present

## 2013-12-28 DIAGNOSIS — I10 Essential (primary) hypertension: Secondary | ICD-10-CM | POA: Diagnosis not present

## 2013-12-28 DIAGNOSIS — E109 Type 1 diabetes mellitus without complications: Secondary | ICD-10-CM | POA: Diagnosis not present

## 2013-12-28 DIAGNOSIS — R0609 Other forms of dyspnea: Secondary | ICD-10-CM | POA: Diagnosis not present

## 2013-12-28 DIAGNOSIS — R7881 Bacteremia: Secondary | ICD-10-CM | POA: Diagnosis not present

## 2013-12-28 DIAGNOSIS — J390 Retropharyngeal and parapharyngeal abscess: Secondary | ICD-10-CM | POA: Diagnosis not present

## 2013-12-28 DIAGNOSIS — N186 End stage renal disease: Secondary | ICD-10-CM | POA: Diagnosis not present

## 2013-12-29 DIAGNOSIS — R0609 Other forms of dyspnea: Secondary | ICD-10-CM | POA: Diagnosis not present

## 2013-12-29 DIAGNOSIS — A4902 Methicillin resistant Staphylococcus aureus infection, unspecified site: Secondary | ICD-10-CM | POA: Diagnosis not present

## 2013-12-29 DIAGNOSIS — R7881 Bacteremia: Secondary | ICD-10-CM | POA: Diagnosis not present

## 2013-12-29 DIAGNOSIS — E109 Type 1 diabetes mellitus without complications: Secondary | ICD-10-CM | POA: Diagnosis not present

## 2013-12-29 DIAGNOSIS — J390 Retropharyngeal and parapharyngeal abscess: Secondary | ICD-10-CM | POA: Diagnosis not present

## 2013-12-29 DIAGNOSIS — N186 End stage renal disease: Secondary | ICD-10-CM | POA: Diagnosis not present

## 2013-12-29 DIAGNOSIS — I10 Essential (primary) hypertension: Secondary | ICD-10-CM | POA: Diagnosis not present

## 2013-12-30 DIAGNOSIS — R7881 Bacteremia: Secondary | ICD-10-CM | POA: Diagnosis not present

## 2013-12-30 DIAGNOSIS — A4902 Methicillin resistant Staphylococcus aureus infection, unspecified site: Secondary | ICD-10-CM | POA: Diagnosis not present

## 2013-12-30 DIAGNOSIS — R0609 Other forms of dyspnea: Secondary | ICD-10-CM | POA: Diagnosis not present

## 2013-12-30 DIAGNOSIS — N186 End stage renal disease: Secondary | ICD-10-CM | POA: Diagnosis not present

## 2013-12-30 DIAGNOSIS — J392 Other diseases of pharynx: Secondary | ICD-10-CM | POA: Diagnosis not present

## 2013-12-30 DIAGNOSIS — I10 Essential (primary) hypertension: Secondary | ICD-10-CM | POA: Diagnosis not present

## 2013-12-31 DIAGNOSIS — J390 Retropharyngeal and parapharyngeal abscess: Secondary | ICD-10-CM | POA: Diagnosis not present

## 2013-12-31 DIAGNOSIS — N186 End stage renal disease: Secondary | ICD-10-CM | POA: Diagnosis not present

## 2013-12-31 DIAGNOSIS — E109 Type 1 diabetes mellitus without complications: Secondary | ICD-10-CM | POA: Diagnosis not present

## 2013-12-31 DIAGNOSIS — R7881 Bacteremia: Secondary | ICD-10-CM | POA: Diagnosis not present

## 2013-12-31 DIAGNOSIS — I10 Essential (primary) hypertension: Secondary | ICD-10-CM | POA: Diagnosis not present

## 2013-12-31 DIAGNOSIS — R0609 Other forms of dyspnea: Secondary | ICD-10-CM | POA: Diagnosis not present

## 2013-12-31 DIAGNOSIS — A4902 Methicillin resistant Staphylococcus aureus infection, unspecified site: Secondary | ICD-10-CM | POA: Diagnosis not present

## 2014-01-01 DIAGNOSIS — N186 End stage renal disease: Secondary | ICD-10-CM | POA: Diagnosis not present

## 2014-01-01 DIAGNOSIS — I10 Essential (primary) hypertension: Secondary | ICD-10-CM | POA: Diagnosis not present

## 2014-01-01 DIAGNOSIS — J390 Retropharyngeal and parapharyngeal abscess: Secondary | ICD-10-CM | POA: Diagnosis not present

## 2014-01-01 DIAGNOSIS — E109 Type 1 diabetes mellitus without complications: Secondary | ICD-10-CM | POA: Diagnosis not present

## 2014-01-02 DIAGNOSIS — I10 Essential (primary) hypertension: Secondary | ICD-10-CM | POA: Diagnosis not present

## 2014-01-02 DIAGNOSIS — E109 Type 1 diabetes mellitus without complications: Secondary | ICD-10-CM | POA: Diagnosis not present

## 2014-01-02 DIAGNOSIS — J390 Retropharyngeal and parapharyngeal abscess: Secondary | ICD-10-CM | POA: Diagnosis not present

## 2014-01-02 DIAGNOSIS — N186 End stage renal disease: Secondary | ICD-10-CM | POA: Diagnosis not present

## 2014-01-03 DIAGNOSIS — J390 Retropharyngeal and parapharyngeal abscess: Secondary | ICD-10-CM | POA: Diagnosis not present

## 2014-01-03 DIAGNOSIS — I10 Essential (primary) hypertension: Secondary | ICD-10-CM | POA: Diagnosis not present

## 2014-01-03 DIAGNOSIS — N186 End stage renal disease: Secondary | ICD-10-CM | POA: Diagnosis not present

## 2014-01-03 DIAGNOSIS — E109 Type 1 diabetes mellitus without complications: Secondary | ICD-10-CM | POA: Diagnosis not present

## 2014-01-09 DIAGNOSIS — Z4889 Encounter for other specified surgical aftercare: Secondary | ICD-10-CM | POA: Diagnosis not present

## 2014-01-09 DIAGNOSIS — I10 Essential (primary) hypertension: Secondary | ICD-10-CM | POA: Diagnosis not present

## 2014-01-09 DIAGNOSIS — N186 End stage renal disease: Secondary | ICD-10-CM | POA: Diagnosis not present

## 2014-01-09 DIAGNOSIS — N184 Chronic kidney disease, stage 4 (severe): Secondary | ICD-10-CM | POA: Diagnosis not present

## 2014-01-09 DIAGNOSIS — I2690 Septic pulmonary embolism without acute cor pulmonale: Secondary | ICD-10-CM | POA: Diagnosis not present

## 2014-01-09 DIAGNOSIS — J390 Retropharyngeal and parapharyngeal abscess: Secondary | ICD-10-CM | POA: Diagnosis not present

## 2014-01-09 DIAGNOSIS — E109 Type 1 diabetes mellitus without complications: Secondary | ICD-10-CM | POA: Diagnosis not present

## 2014-01-09 DIAGNOSIS — A4902 Methicillin resistant Staphylococcus aureus infection, unspecified site: Secondary | ICD-10-CM | POA: Diagnosis not present

## 2014-01-24 DIAGNOSIS — J329 Chronic sinusitis, unspecified: Secondary | ICD-10-CM | POA: Diagnosis not present

## 2014-02-07 DIAGNOSIS — N186 End stage renal disease: Secondary | ICD-10-CM | POA: Diagnosis not present

## 2014-02-07 DIAGNOSIS — I10 Essential (primary) hypertension: Secondary | ICD-10-CM | POA: Diagnosis not present

## 2014-02-07 DIAGNOSIS — E109 Type 1 diabetes mellitus without complications: Secondary | ICD-10-CM | POA: Diagnosis not present

## 2014-02-14 DIAGNOSIS — R9431 Abnormal electrocardiogram [ECG] [EKG]: Secondary | ICD-10-CM | POA: Diagnosis not present

## 2014-02-14 DIAGNOSIS — E1142 Type 2 diabetes mellitus with diabetic polyneuropathy: Secondary | ICD-10-CM | POA: Diagnosis not present

## 2014-02-14 DIAGNOSIS — Z713 Dietary counseling and surveillance: Secondary | ICD-10-CM | POA: Diagnosis not present

## 2014-02-14 DIAGNOSIS — N186 End stage renal disease: Secondary | ICD-10-CM | POA: Diagnosis not present

## 2014-02-14 DIAGNOSIS — E1049 Type 1 diabetes mellitus with other diabetic neurological complication: Secondary | ICD-10-CM | POA: Diagnosis not present

## 2014-02-14 DIAGNOSIS — Z992 Dependence on renal dialysis: Secondary | ICD-10-CM | POA: Diagnosis not present

## 2014-02-14 DIAGNOSIS — E1029 Type 1 diabetes mellitus with other diabetic kidney complication: Secondary | ICD-10-CM | POA: Diagnosis not present

## 2014-02-14 DIAGNOSIS — R0789 Other chest pain: Secondary | ICD-10-CM | POA: Diagnosis not present

## 2014-02-14 DIAGNOSIS — Z79899 Other long term (current) drug therapy: Secondary | ICD-10-CM | POA: Diagnosis not present

## 2014-02-14 DIAGNOSIS — E1065 Type 1 diabetes mellitus with hyperglycemia: Secondary | ICD-10-CM | POA: Diagnosis not present

## 2014-02-14 DIAGNOSIS — E119 Type 2 diabetes mellitus without complications: Secondary | ICD-10-CM | POA: Diagnosis not present

## 2014-02-14 DIAGNOSIS — N058 Unspecified nephritic syndrome with other morphologic changes: Secondary | ICD-10-CM | POA: Diagnosis not present

## 2014-02-28 ENCOUNTER — Ambulatory Visit: Payer: Self-pay | Admitting: Vascular Surgery

## 2014-03-10 DIAGNOSIS — E11311 Type 2 diabetes mellitus with unspecified diabetic retinopathy with macular edema: Secondary | ICD-10-CM | POA: Diagnosis not present

## 2014-03-10 DIAGNOSIS — E1139 Type 2 diabetes mellitus with other diabetic ophthalmic complication: Secondary | ICD-10-CM | POA: Diagnosis not present

## 2014-03-10 DIAGNOSIS — H35379 Puckering of macula, unspecified eye: Secondary | ICD-10-CM | POA: Diagnosis not present

## 2014-03-10 DIAGNOSIS — E11359 Type 2 diabetes mellitus with proliferative diabetic retinopathy without macular edema: Secondary | ICD-10-CM | POA: Diagnosis not present

## 2014-03-14 ENCOUNTER — Ambulatory Visit: Payer: Self-pay | Admitting: Vascular Surgery

## 2014-03-24 DIAGNOSIS — E11311 Type 2 diabetes mellitus with unspecified diabetic retinopathy with macular edema: Secondary | ICD-10-CM | POA: Diagnosis not present

## 2014-03-24 DIAGNOSIS — Z961 Presence of intraocular lens: Secondary | ICD-10-CM | POA: Diagnosis not present

## 2014-03-24 DIAGNOSIS — E11359 Type 2 diabetes mellitus with proliferative diabetic retinopathy without macular edema: Secondary | ICD-10-CM | POA: Diagnosis not present

## 2014-03-24 DIAGNOSIS — E1139 Type 2 diabetes mellitus with other diabetic ophthalmic complication: Secondary | ICD-10-CM | POA: Diagnosis not present

## 2014-04-06 DIAGNOSIS — M542 Cervicalgia: Secondary | ICD-10-CM | POA: Diagnosis not present

## 2014-04-06 DIAGNOSIS — E039 Hypothyroidism, unspecified: Secondary | ICD-10-CM | POA: Diagnosis not present

## 2014-04-06 DIAGNOSIS — E109 Type 1 diabetes mellitus without complications: Secondary | ICD-10-CM | POA: Diagnosis not present

## 2014-05-16 DIAGNOSIS — M542 Cervicalgia: Secondary | ICD-10-CM | POA: Diagnosis not present

## 2014-05-25 DIAGNOSIS — N058 Unspecified nephritic syndrome with other morphologic changes: Secondary | ICD-10-CM | POA: Diagnosis not present

## 2014-05-25 DIAGNOSIS — E1029 Type 1 diabetes mellitus with other diabetic kidney complication: Secondary | ICD-10-CM | POA: Diagnosis not present

## 2014-06-22 DIAGNOSIS — E1029 Type 1 diabetes mellitus with other diabetic kidney complication: Secondary | ICD-10-CM | POA: Diagnosis not present

## 2014-06-22 DIAGNOSIS — R946 Abnormal results of thyroid function studies: Secondary | ICD-10-CM | POA: Diagnosis not present

## 2014-07-11 DIAGNOSIS — I10 Essential (primary) hypertension: Secondary | ICD-10-CM | POA: Diagnosis not present

## 2014-07-11 DIAGNOSIS — E119 Type 2 diabetes mellitus without complications: Secondary | ICD-10-CM | POA: Diagnosis not present

## 2014-07-11 DIAGNOSIS — E785 Hyperlipidemia, unspecified: Secondary | ICD-10-CM | POA: Diagnosis not present

## 2014-07-13 DIAGNOSIS — F329 Major depressive disorder, single episode, unspecified: Secondary | ICD-10-CM | POA: Diagnosis not present

## 2014-07-13 DIAGNOSIS — E8779 Other fluid overload: Secondary | ICD-10-CM | POA: Diagnosis not present

## 2014-07-13 DIAGNOSIS — F410 Panic disorder [episodic paroxysmal anxiety] without agoraphobia: Secondary | ICD-10-CM | POA: Diagnosis not present

## 2014-07-13 DIAGNOSIS — F54 Psychological and behavioral factors associated with disorders or diseases classified elsewhere: Secondary | ICD-10-CM | POA: Diagnosis not present

## 2014-07-13 DIAGNOSIS — Z01818 Encounter for other preprocedural examination: Secondary | ICD-10-CM | POA: Diagnosis not present

## 2014-07-13 DIAGNOSIS — Z008 Encounter for other general examination: Secondary | ICD-10-CM | POA: Diagnosis not present

## 2014-07-13 DIAGNOSIS — Z01810 Encounter for preprocedural cardiovascular examination: Secondary | ICD-10-CM | POA: Diagnosis not present

## 2014-08-22 DIAGNOSIS — N186 End stage renal disease: Secondary | ICD-10-CM | POA: Diagnosis not present

## 2014-08-22 DIAGNOSIS — I509 Heart failure, unspecified: Secondary | ICD-10-CM | POA: Diagnosis not present

## 2014-08-22 DIAGNOSIS — I150 Renovascular hypertension: Secondary | ICD-10-CM | POA: Diagnosis not present

## 2014-08-22 DIAGNOSIS — E1021 Type 1 diabetes mellitus with diabetic nephropathy: Secondary | ICD-10-CM | POA: Diagnosis not present

## 2014-08-24 DIAGNOSIS — R319 Hematuria, unspecified: Secondary | ICD-10-CM | POA: Diagnosis not present

## 2014-08-24 DIAGNOSIS — N529 Male erectile dysfunction, unspecified: Secondary | ICD-10-CM | POA: Diagnosis not present

## 2014-08-24 DIAGNOSIS — Z125 Encounter for screening for malignant neoplasm of prostate: Secondary | ICD-10-CM | POA: Diagnosis not present

## 2014-08-31 DIAGNOSIS — R319 Hematuria, unspecified: Secondary | ICD-10-CM | POA: Diagnosis not present

## 2014-08-31 DIAGNOSIS — N529 Male erectile dysfunction, unspecified: Secondary | ICD-10-CM | POA: Diagnosis not present

## 2014-08-31 DIAGNOSIS — E669 Obesity, unspecified: Secondary | ICD-10-CM | POA: Diagnosis not present

## 2014-08-31 DIAGNOSIS — E663 Overweight: Secondary | ICD-10-CM | POA: Diagnosis not present

## 2014-08-31 DIAGNOSIS — I10 Essential (primary) hypertension: Secondary | ICD-10-CM | POA: Diagnosis not present

## 2014-09-21 DIAGNOSIS — E1021 Type 1 diabetes mellitus with diabetic nephropathy: Secondary | ICD-10-CM | POA: Diagnosis not present

## 2014-09-21 DIAGNOSIS — I150 Renovascular hypertension: Secondary | ICD-10-CM | POA: Diagnosis not present

## 2014-09-21 DIAGNOSIS — F411 Generalized anxiety disorder: Secondary | ICD-10-CM | POA: Diagnosis not present

## 2014-09-21 DIAGNOSIS — F331 Major depressive disorder, recurrent, moderate: Secondary | ICD-10-CM | POA: Diagnosis not present

## 2014-09-21 DIAGNOSIS — I120 Hypertensive chronic kidney disease with stage 5 chronic kidney disease or end stage renal disease: Secondary | ICD-10-CM | POA: Diagnosis not present

## 2014-09-21 DIAGNOSIS — N186 End stage renal disease: Secondary | ICD-10-CM | POA: Diagnosis not present

## 2014-09-21 DIAGNOSIS — T827XXA Infection and inflammatory reaction due to other cardiac and vascular devices, implants and grafts, initial encounter: Secondary | ICD-10-CM | POA: Diagnosis not present

## 2014-10-26 DIAGNOSIS — E669 Obesity, unspecified: Secondary | ICD-10-CM | POA: Diagnosis not present

## 2014-10-26 DIAGNOSIS — E785 Hyperlipidemia, unspecified: Secondary | ICD-10-CM | POA: Diagnosis not present

## 2014-10-26 DIAGNOSIS — T859XXA Unspecified complication of internal prosthetic device, implant and graft, initial encounter: Secondary | ICD-10-CM | POA: Diagnosis not present

## 2014-10-26 DIAGNOSIS — Y841 Kidney dialysis as the cause of abnormal reaction of the patient, or of later complication, without mention of misadventure at the time of the procedure: Secondary | ICD-10-CM | POA: Diagnosis not present

## 2014-10-26 DIAGNOSIS — N186 End stage renal disease: Secondary | ICD-10-CM | POA: Diagnosis not present

## 2014-10-26 DIAGNOSIS — I10 Essential (primary) hypertension: Secondary | ICD-10-CM | POA: Diagnosis not present

## 2014-11-19 DIAGNOSIS — R05 Cough: Secondary | ICD-10-CM | POA: Diagnosis not present

## 2014-11-19 DIAGNOSIS — N186 End stage renal disease: Secondary | ICD-10-CM | POA: Diagnosis not present

## 2014-11-19 DIAGNOSIS — E118 Type 2 diabetes mellitus with unspecified complications: Secondary | ICD-10-CM | POA: Diagnosis not present

## 2014-11-19 DIAGNOSIS — J209 Acute bronchitis, unspecified: Secondary | ICD-10-CM | POA: Diagnosis not present

## 2014-11-26 DIAGNOSIS — G4730 Sleep apnea, unspecified: Secondary | ICD-10-CM | POA: Diagnosis present

## 2014-11-26 DIAGNOSIS — I5032 Chronic diastolic (congestive) heart failure: Secondary | ICD-10-CM | POA: Diagnosis not present

## 2014-11-26 DIAGNOSIS — E785 Hyperlipidemia, unspecified: Secondary | ICD-10-CM | POA: Diagnosis present

## 2014-11-26 DIAGNOSIS — R140 Abdominal distension (gaseous): Secondary | ICD-10-CM | POA: Diagnosis not present

## 2014-11-26 DIAGNOSIS — G629 Polyneuropathy, unspecified: Secondary | ICD-10-CM | POA: Diagnosis present

## 2014-11-26 DIAGNOSIS — R918 Other nonspecific abnormal finding of lung field: Secondary | ICD-10-CM | POA: Diagnosis not present

## 2014-11-26 DIAGNOSIS — M109 Gout, unspecified: Secondary | ICD-10-CM | POA: Diagnosis present

## 2014-11-26 DIAGNOSIS — E1021 Type 1 diabetes mellitus with diabetic nephropathy: Secondary | ICD-10-CM | POA: Diagnosis present

## 2014-11-26 DIAGNOSIS — D649 Anemia, unspecified: Secondary | ICD-10-CM | POA: Diagnosis present

## 2014-11-26 DIAGNOSIS — K219 Gastro-esophageal reflux disease without esophagitis: Secondary | ICD-10-CM | POA: Diagnosis present

## 2014-11-26 DIAGNOSIS — E1042 Type 1 diabetes mellitus with diabetic polyneuropathy: Secondary | ICD-10-CM | POA: Diagnosis present

## 2014-11-26 DIAGNOSIS — I120 Hypertensive chronic kidney disease with stage 5 chronic kidney disease or end stage renal disease: Secondary | ICD-10-CM | POA: Diagnosis present

## 2014-11-26 DIAGNOSIS — I272 Other secondary pulmonary hypertension: Secondary | ICD-10-CM | POA: Diagnosis present

## 2014-11-26 DIAGNOSIS — R05 Cough: Secondary | ICD-10-CM | POA: Diagnosis not present

## 2014-11-26 DIAGNOSIS — Z794 Long term (current) use of insulin: Secondary | ICD-10-CM | POA: Diagnosis not present

## 2014-11-26 DIAGNOSIS — N529 Male erectile dysfunction, unspecified: Secondary | ICD-10-CM | POA: Diagnosis present

## 2014-11-26 DIAGNOSIS — Z992 Dependence on renal dialysis: Secondary | ICD-10-CM | POA: Diagnosis not present

## 2014-11-26 DIAGNOSIS — R0602 Shortness of breath: Secondary | ICD-10-CM | POA: Diagnosis not present

## 2014-11-26 DIAGNOSIS — J189 Pneumonia, unspecified organism: Secondary | ICD-10-CM | POA: Diagnosis not present

## 2014-11-26 DIAGNOSIS — E10359 Type 1 diabetes mellitus with proliferative diabetic retinopathy without macular edema: Secondary | ICD-10-CM | POA: Diagnosis present

## 2014-11-26 DIAGNOSIS — N186 End stage renal disease: Secondary | ICD-10-CM | POA: Diagnosis present

## 2014-12-11 ENCOUNTER — Emergency Department: Payer: Self-pay | Admitting: Emergency Medicine

## 2014-12-11 DIAGNOSIS — A084 Viral intestinal infection, unspecified: Secondary | ICD-10-CM | POA: Diagnosis not present

## 2014-12-11 DIAGNOSIS — E119 Type 2 diabetes mellitus without complications: Secondary | ICD-10-CM | POA: Diagnosis not present

## 2014-12-11 DIAGNOSIS — I10 Essential (primary) hypertension: Secondary | ICD-10-CM | POA: Diagnosis not present

## 2014-12-12 ENCOUNTER — Inpatient Hospital Stay: Payer: Self-pay | Admitting: Internal Medicine

## 2014-12-12 ENCOUNTER — Encounter: Payer: Self-pay | Admitting: Internal Medicine

## 2014-12-12 DIAGNOSIS — R1310 Dysphagia, unspecified: Secondary | ICD-10-CM | POA: Diagnosis present

## 2014-12-12 DIAGNOSIS — I776 Arteritis, unspecified: Secondary | ICD-10-CM | POA: Diagnosis present

## 2014-12-12 DIAGNOSIS — E1142 Type 2 diabetes mellitus with diabetic polyneuropathy: Secondary | ICD-10-CM | POA: Diagnosis present

## 2014-12-12 DIAGNOSIS — I517 Cardiomegaly: Secondary | ICD-10-CM | POA: Diagnosis not present

## 2014-12-12 DIAGNOSIS — Z8249 Family history of ischemic heart disease and other diseases of the circulatory system: Secondary | ICD-10-CM | POA: Diagnosis not present

## 2014-12-12 DIAGNOSIS — E785 Hyperlipidemia, unspecified: Secondary | ICD-10-CM | POA: Diagnosis present

## 2014-12-12 DIAGNOSIS — N2581 Secondary hyperparathyroidism of renal origin: Secondary | ICD-10-CM | POA: Diagnosis present

## 2014-12-12 DIAGNOSIS — I2510 Atherosclerotic heart disease of native coronary artery without angina pectoris: Secondary | ICD-10-CM | POA: Diagnosis present

## 2014-12-12 DIAGNOSIS — I5032 Chronic diastolic (congestive) heart failure: Secondary | ICD-10-CM | POA: Diagnosis present

## 2014-12-12 DIAGNOSIS — E11319 Type 2 diabetes mellitus with unspecified diabetic retinopathy without macular edema: Secondary | ICD-10-CM | POA: Diagnosis present

## 2014-12-12 DIAGNOSIS — Z992 Dependence on renal dialysis: Secondary | ICD-10-CM | POA: Diagnosis not present

## 2014-12-12 DIAGNOSIS — Z6838 Body mass index (BMI) 38.0-38.9, adult: Secondary | ICD-10-CM | POA: Diagnosis not present

## 2014-12-12 DIAGNOSIS — G4733 Obstructive sleep apnea (adult) (pediatric): Secondary | ICD-10-CM | POA: Diagnosis present

## 2014-12-12 DIAGNOSIS — Z7982 Long term (current) use of aspirin: Secondary | ICD-10-CM | POA: Diagnosis not present

## 2014-12-12 DIAGNOSIS — R197 Diarrhea, unspecified: Secondary | ICD-10-CM | POA: Diagnosis not present

## 2014-12-12 DIAGNOSIS — E11649 Type 2 diabetes mellitus with hypoglycemia without coma: Secondary | ICD-10-CM | POA: Diagnosis present

## 2014-12-12 DIAGNOSIS — J189 Pneumonia, unspecified organism: Secondary | ICD-10-CM | POA: Diagnosis not present

## 2014-12-12 DIAGNOSIS — K219 Gastro-esophageal reflux disease without esophagitis: Secondary | ICD-10-CM | POA: Diagnosis present

## 2014-12-12 DIAGNOSIS — E46 Unspecified protein-calorie malnutrition: Secondary | ICD-10-CM | POA: Diagnosis present

## 2014-12-12 DIAGNOSIS — F329 Major depressive disorder, single episode, unspecified: Secondary | ICD-10-CM | POA: Diagnosis present

## 2014-12-12 DIAGNOSIS — E039 Hypothyroidism, unspecified: Secondary | ICD-10-CM | POA: Diagnosis present

## 2014-12-12 DIAGNOSIS — R112 Nausea with vomiting, unspecified: Secondary | ICD-10-CM | POA: Diagnosis not present

## 2014-12-12 DIAGNOSIS — I120 Hypertensive chronic kidney disease with stage 5 chronic kidney disease or end stage renal disease: Secondary | ICD-10-CM | POA: Diagnosis not present

## 2014-12-12 DIAGNOSIS — N186 End stage renal disease: Secondary | ICD-10-CM | POA: Diagnosis not present

## 2014-12-12 DIAGNOSIS — Z794 Long term (current) use of insulin: Secondary | ICD-10-CM | POA: Diagnosis not present

## 2014-12-12 DIAGNOSIS — R0602 Shortness of breath: Secondary | ICD-10-CM | POA: Diagnosis not present

## 2014-12-13 DIAGNOSIS — I517 Cardiomegaly: Secondary | ICD-10-CM

## 2014-12-14 ENCOUNTER — Encounter: Payer: Self-pay | Admitting: Internal Medicine

## 2014-12-20 ENCOUNTER — Encounter: Payer: Self-pay | Admitting: Internal Medicine

## 2014-12-26 DIAGNOSIS — E1021 Type 1 diabetes mellitus with diabetic nephropathy: Secondary | ICD-10-CM | POA: Diagnosis not present

## 2014-12-26 DIAGNOSIS — I509 Heart failure, unspecified: Secondary | ICD-10-CM | POA: Diagnosis not present

## 2014-12-26 DIAGNOSIS — F331 Major depressive disorder, recurrent, moderate: Secondary | ICD-10-CM | POA: Diagnosis not present

## 2014-12-26 DIAGNOSIS — J189 Pneumonia, unspecified organism: Secondary | ICD-10-CM | POA: Diagnosis not present

## 2015-01-02 ENCOUNTER — Encounter: Payer: Self-pay | Admitting: Internal Medicine

## 2015-01-02 DIAGNOSIS — F331 Major depressive disorder, recurrent, moderate: Secondary | ICD-10-CM | POA: Diagnosis not present

## 2015-01-07 ENCOUNTER — Inpatient Hospital Stay
Admit: 2015-01-07 | Disposition: A | Discharge status: Home / Self Care | Payer: Self-pay | Attending: Internal Medicine | Admitting: Internal Medicine

## 2015-01-07 LAB — CBC
HCT: 32.2 % — ABNORMAL LOW (ref 40.0–52.0)
HGB: 10.3 g/dL — ABNORMAL LOW (ref 13.0–18.0)
MCH: 28 pg (ref 26.0–34.0)
MCHC: 32 g/dL (ref 32.0–36.0)
MCV: 88 fL (ref 80–100)
PLATELETS: 381 10*3/uL (ref 150–440)
RBC: 3.68 10*6/uL — ABNORMAL LOW (ref 4.40–5.90)
RDW: 17.3 % — AB (ref 11.5–14.5)
WBC: 9.6 10*3/uL (ref 3.8–10.6)

## 2015-01-07 LAB — BASIC METABOLIC PANEL
Anion Gap: 9 (ref 7–16)
BUN: 58 mg/dL — AB
CREATININE: 8.02 mg/dL — AB
Calcium, Total: 8.2 mg/dL — ABNORMAL LOW
Chloride: 96 mmol/L — ABNORMAL LOW
Co2: 27 mmol/L
EGFR (Non-African Amer.): 7 — ABNORMAL LOW
GFR CALC AF AMER: 8 — AB
Glucose: 555 mg/dL
Potassium: 4.9 mmol/L
SODIUM: 132 mmol/L — AB

## 2015-01-07 LAB — TROPONIN I: TROPONIN-I: 0.03 ng/mL

## 2015-01-08 LAB — LIPID PANEL
Cholesterol: 124 mg/dL
HDL Cholesterol: 49 mg/dL
Ldl Cholesterol, Calc: 42 mg/dL
Triglycerides: 167 mg/dL — ABNORMAL HIGH
VLDL Cholesterol, Calc: 33 mg/dL

## 2015-01-08 LAB — HEMOGLOBIN A1C: Hemoglobin A1C: 7.8 % — ABNORMAL HIGH

## 2015-01-08 LAB — TSH: Thyroid Stimulating Horm: 6.226 u[IU]/mL — ABNORMAL HIGH

## 2015-01-10 LAB — VANCOMYCIN, TROUGH: Vancomycin, Trough: 11 ug/mL

## 2015-01-10 LAB — PHOSPHORUS: Phosphorus: 5.6 mg/dL — ABNORMAL HIGH

## 2015-01-10 LAB — POTASSIUM: Potassium: 4.9 mmol/L

## 2015-01-10 LAB — HEMOGLOBIN: HGB: 9.2 g/dL — ABNORMAL LOW (ref 13.0–18.0)

## 2015-01-11 LAB — PLATELET COUNT: Platelet: 247 10*3/uL (ref 150–440)

## 2015-01-12 LAB — CULTURE, BLOOD (SINGLE)

## 2015-01-13 DIAGNOSIS — G4733 Obstructive sleep apnea (adult) (pediatric): Secondary | ICD-10-CM | POA: Diagnosis not present

## 2015-01-13 LAB — CULTURE, FUNGUS WITHOUT SMEAR

## 2015-01-15 ENCOUNTER — Ambulatory Visit
Admit: 2015-01-15 | Disposition: A | Discharge status: Home / Self Care | Payer: Self-pay | Attending: Cardiothoracic Surgery | Admitting: Cardiothoracic Surgery

## 2015-01-15 ENCOUNTER — Ambulatory Visit (INDEPENDENT_AMBULATORY_CARE_PROVIDER_SITE_OTHER): Payer: BC Managed Care – PPO | Admitting: Internal Medicine

## 2015-01-15 ENCOUNTER — Encounter: Payer: Self-pay | Admitting: Internal Medicine

## 2015-01-15 VITALS — BP 118/60 | HR 80 | Temp 98.7°F | Ht 74.0 in | Wt 295.0 lb

## 2015-01-15 DIAGNOSIS — J189 Pneumonia, unspecified organism: Secondary | ICD-10-CM | POA: Insufficient documentation

## 2015-01-15 DIAGNOSIS — R0600 Dyspnea, unspecified: Secondary | ICD-10-CM

## 2015-01-15 DIAGNOSIS — G4733 Obstructive sleep apnea (adult) (pediatric): Secondary | ICD-10-CM

## 2015-01-15 NOTE — Progress Notes (Signed)
Date: 01/15/2015  MRN# [id number] Ryan Arnold 25-Dec-1960  Referring Physician: hospital followup PMD - Dr. Natale Milch [phone], [phone])  Ryan Arnold is a 54 y.o. old male seen in consultation for hospital follow up of recurrent pneumonia  CC:  Chief Complaint  Ryan Arnold presents with  . Advice Only    Hosp f/u recurrent pneumonia. Pt was last d/c on 02/10/15, Pt says he has non-productive cough, wheezing,sob.Pt is on 2L prn 02. no chest tightness  synopsis: 54 year old male past medical history of end-stage renal disease on dialysis, uncontrolled diabetes, hypertension, former retired Emergency planning/management officer, seen in consultation for recurrent pneumonias. Multiple hospitalizations at [facility] and at [facility] over the past one year for bilateral groundglass opacities throughout entire lung fields requiring multiple rounds of antibiotics and steroids. Had a bronchoscopy done at [facility] and within the last 2 years no acute findings from the BAL. Ryan Arnold with recent bronchoscopy, March 2016, at [facility] by Dr. Dema Severin, BAL  with no acute findings.  HPI:  Ryan Arnold is a pleasant 53 year old male presents today for hospital followup. Ryan Arnold has a past medical history of end stage renal disease on dialysis, diabetes, hypertension, renal osteodystrophy, diastolic heart failure. Ryan Arnold was seen in the hospital for recurrent pneumonias from March 1 to March 7, and during that time he had a bronchoscopy with BAL. Given his recurrent bouts of pneumonia over the past one year and being seen at [facility] and by [facility] over the past one year with noted ilateral groundglass opacities throughout the entire lung fields on chest CT and he was further evaluated by Dr. Thelma Barge ([facility]) for surgical lung biopsy, however given the Ryan Arnold was improving Ryan Arnold declined further surgical lung biopsy at that time. See below for full details from the hospital consult note on 12/13/2014.  During this hospitalization Ryan Arnold also  had a vasculitis panel which was negative. Ryan Arnold does was readmitted since I last saw him at the beginning of March, again to [facility] last week for hypoxia shortness of breath and atypical chest pain.  He was again diagnosed with recurrent bilateral pneumonia (HCAP), acute on chronic diastolic heart failure.  During this recent hospitalization he was treated for fluid overload, pneumonia, after receiving BiPAP and dialysis his breathing improved, he was prophylactically placed on antibiotics given the results of his CT scan which again showed bilateral upper lobe pneumonia.  Off note, he has had a bronchoscopy at [facility] in past 1.5 years, per the Ryan Arnold he said that there was no acute findings from that. Today Ryan Arnold states that he still has a mild dry cough, his shortness of breath has improved but he is concerned that since being off antibiotics his "pneumonia" may return. He was discharged this time from [facility] with 2 L of oxygen as needed, currently only wearing oxygen when needed - if out walking, or performing chores, which he subjectively judges his O2 needs.  His PMD ([facility] - Dr. Arty Baumgartner) ordered a sleep study for him, which he performed on 01/13/15, per Ryan Arnold he will require CPAP therapy.   [facility] Hospitalization Review DATE OF ADMISSION:  01/07/2015 DATE OF DISCHARGE:  01/11/2015  ADMITTING DIAGNOSES: Hypoxia, shortness of breath, atypical chest pain.   DISCHARGE DIAGNOSES: 1. Acute respiratory failure due to recurrent bilateral pneumonia, felt to be due to hospital acquired pneumonia, as well as acute on chronic diastolic congestive heart failure.  2. Acute on chronic diastolic congestive heart failure.  3. Healthcare associated pneumonia.  4. Chest pain, felt to  be due to cough and pulmonary symptoms.  5. End-stage renal disease on hemodialysis.  6. Renal osteodystrophy.  7. Diabetes type 2, with labile blood glucose with hypoglycemia.  8. Hypertension.  9. Hyperlipidemia.   10. Depression.  11. Morbid obesity, weight loss recommended.  12. Status post fistula placement in the left arm.  13. Status post bilateral cataract removal.  14. Status post lens placement in his eyes bilaterally.  15. Status post laser retinal repair.  16. Status post right patellar tendon repair.  17. Status post left shoulder repair.  HOSPITAL COURSE: Please refer to the history and physical done by the admitting physician. The Ryan Arnold is a 54 year old, African American male, who presented to the ED with complaint of shortness of breath. He was recently hospitalized with pneumonia. The Ryan Arnold initially had to be placed on BiPAP. He was seen by nephrology, and also underwent dialysis. After dialysis, his breathing improved. To ensure that his symptoms were just not due to acute diastolic congestive heart failure, he underwent a CT per PE protocol, which confirmed findings of bilateral upper lobe pneumonia. The Ryan Arnold was continued on broad-spectrum antibiotics, and now his breathing has improved and he is doing much better. He will need home oxygen therapy. At this time, he is stable for discharge.   DISCHARGE MEDICATIONS: Aspirin 81 mg 1 tablet p.o. daily, atorvastatin 20 at bedtime, Rena-Vite 1 tablet p.o. daily, gabapentin 100 mg 1 tablet p.o. daily, amlodipine 10 mg daily, duloxetine 60 two capsules daily, EMLA topical cream affected area prior to dialysis, magnesium hydroxide 400 daily, sevelamer 800 mg 1 tablet p.o. b.i.d. with snacks, Protonix 40 mg 1 tablet p.o. b.i.d., lisinopril 20 daily, allopurinol 200 daily, clonazepam 0.5 once in the evening as needed, Lantus 74 units at bedtime, Lasix 40 daily, metoprolol tartrate 25 mg 1 tablet p.o. b.i.d., Sensipar 30 daily, bupropion 75 one tablet p.o. b.i.d., Epogen with dialysis, ketoconazole topically to affected area b.i.d., Nitrostat 0.4 sublingual p.r.n., Senokot 2 tablets b.i.d., NovoLog sliding scale as previously,  2 tablets t.i.d. with  meals, albuterol 2 puffs 4 times a day, levofloxacin 500 mg 1 tablet  p.o. q.48 h.   HOME OXYGEN: O2 at 2-liter nasal cannula.   [facility] Hospital review 12/12/2014 to 12/18/2014 Initial Consult note by Dr. Dema Severin [facility]).  12/13/2014 54 year old male who has history of end-stage renal disease, on hemodialysis, type 2 diabetes, hypertension, hyperlipidemia, coronary artery disease, morbidly obese, and CPAP use seen in consultation today for evaluation of recurrent pneumonia. He was recently admitted at [facility] 2 weeks ago where he was treated for pneumonia that time, he was admitted for 4 days. Ryan Arnold was treated for pneumonia with triple antibiotics, and on discharge he was given some oral antibiotic tablets, which he finished 6 days ago. Even after finishing antibiotic, he continued to have fever, chills, and cough with brownish- producing sputum. Ryan Arnold initially presented to [facility] with the same symptoms of cough, fever, chills, body aches, productive sputum (brownish in color), upon discharge she was still having cough but it was getting better. He presented to [facility] again 4 days ago, with symptoms of mild sore throat, cough, fever and was told that he has viral illness and sent home without any intervention or antibiotic. Off note, after he left the ED he went to dialysis and had a partial dialysis session due to being late.  His normal dialysis days are Monday, Wednesday, Friday. Ryan Arnold reported to the [facility] ED on 12/12/2014 with similar complaints as  prior, repeat chest x-ray this time showed right lower lobe/right middle lobe possible pneumonia. Today, on further questioning, he denies any worsening shortness of breath or cough, his legs are always swollen, but tend to go down after dialysis sessions. He denies any other complaint.  PLAN: 54 year old male past medical history of ESRD on HD ([facility], [facility] diabetes, hypertension, hyperlipidemia, status post left upper extremity AV  fistula, admitted for possible recurrent pneumonia/fever/cough/shortness of breath. Of note, had a recent admission at [facility] for 4 days with similar complaints treated with reported triple antibiotics.  Right middle lobe/right lower lobe opacities -Differential includes new onset infiltrate possible pneumonia versus basilar congestion versus fluid overload - White cell count initially noted to be about 11 K. now down to 9.8, no reported fevers during this hospitalization, currently at 6 L oxygen with a saturation of 93%. -Recommend to obtain records from [facility] (bronchoscopy reports, CT chest report, chest x-ray reports, discharge summary) -Continue treating as hospital-acquired pneumonia given his recent history of admission to previous institution -Followup respiratory cultures -Followup blood cultures -Given the elevated BNP would recommend 2-D echo -May give supplemental oxygen to maintain O2 saturations greater than 88%. -Doing nebs every 4 hours while awake for next 2 days, then every 4 hours as needed for shortness of breath/wheezing - Check viral panel -Noted to have infected left upper extremity AV fistula March 2015 with MRSA, recommend evaluating AV fistula and following blood cultures closely along with 2-D echo. -Ryan Arnold has had a bronchoscopy and [facility] in March 2015, will await current records from [facility] before a decision bronchoscopy is made for this hospitalization. - if Ryan Arnold did not have a recent CT at [facility], then recommend repeating CT chest with contrast given his multiple admissions for recurrent pneumonia and various institutions  hypoxia -Multifactorial: Underlying infection, possible pneumonia, deconditioning -Supplemental oxygen to maintain saturation greater than 88% -Incentive spirometry, duo nebs -Ambulation      PMHX:   Past Medical History  Diagnosis Date  . 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 Hypertension   .  Hyperlipemia   . End stage renal disease    Surgical Hx:  Past Surgical History  Procedure Laterality Date  . Cataract    . Shoulder surgery Left   . Knee surgery Right   . Gallbladder surgery     Family Hx:  Family History  Problem Relation Age of Onset  . Diabetes Mother   . Hypertension Mother    Social Hx:   History  Substance Use Topics  . Smoking status: Never Smoker   . Smokeless tobacco: Never Used  . Alcohol Use: No   Medication:   Current Outpatient Rx  Name  Route  Sig  Dispense  Refill  . albuterol (PROVENTIL HFA;VENTOLIN HFA) 108 (90 BASE) MCG/ACT inhaler   Inhalation   Inhale 108 mcg into the lungs as needed.         [name] allopurinol (ZYLOPRIM) 100 MG tablet   Oral   Take 100 mg by mouth daily.         [name] amLODipine (NORVASC) 5 MG tablet   Oral   Take 5 mg by mouth daily.      1   . atorvastatin (LIPITOR) 20 MG tablet   Oral   Take 20 mg by mouth at bedtime.         [name] buPROPion (WELLBUTRIN) 75 MG tablet   Oral   Take 75 mg by mouth 2 (two)  times daily.      0   . clonazePAM (KLONOPIN) 0.5 MG tablet   Oral   Take 0.5 mg by mouth at bedtime as needed.         . DULoxetine (CYMBALTA) 60 MG capsule   Oral   Take 120 mg by mouth daily.      0   . epoetin alfa (EPOGEN,PROCRIT) 4000 UNIT/ML injection   Subcutaneous   Inject 0.55 mLs into the skin 3 (three) times a week.         . furosemide (LASIX) 40 MG tablet   Oral   Take 40 mg by mouth 2 (two) times daily.         . furosemide (LASIX) 80 MG tablet   Oral   Take 80 mg by mouth 3 (three) times a week.      0   . gabapentin (NEURONTIN) 100 MG capsule   Oral   Take 100 mg by mouth daily.      1   . insulin aspart (NOVOLOG) 100 UNIT/ML FlexPen   Subcutaneous   Inject 25 Units into the skin 3 (three) times daily - between meals and at bedtime.         [name] LANTUS SOLOSTAR 100 UNIT/ML Solostar Pen   Subcutaneous   Inject 50 Units into the skin daily.      1      Dispense as written.   [name] lisinopril (PRINIVIL,ZESTRIL) 20 MG tablet   Oral   Take 20 mg by mouth daily.         . magnesium oxide (MAG-OX) 400 MG tablet   Oral   Take 400 mg by mouth daily.         . metoprolol (LOPRESSOR) 50 MG tablet   Oral   Take 50 mg by mouth 2 (two) times daily.         . multivitamin (RENA-VIT) TABS tablet   Oral   Take 1 tablet by mouth daily.         . nitroGLYCERIN (NITROSTAT) 0.4 MG SL tablet   Sublingual   Place 0.4 mg under the tongue as needed.         . pantoprazole (PROTONIX) 40 MG tablet   Oral   Take 40 mg by mouth daily.         . SENSIPAR 30 MG tablet   Oral   Take 30 mg by mouth daily.           Dispense as written.   . sevelamer carbonate (RENVELA) 800 MG tablet   Oral   Take 800 mg by mouth 3 (three) times daily.             Allergies:  Oxycodone-acetaminophen  Review of Systems: Gen:  Denies  fever, sweats, chills HEENT: Denies blurred vision, double vision, ear pain, eye pain, hearing loss, nose bleeds, sore throat Cvc:  No dizziness, chest pain or heaviness Resp:   Sob, mild dry cough, persistent DOE Gi: Denies swallowing difficulty, stomach pain, nausea or vomiting, diarrhea, constipation, bowel incontinence Gu:  Denies bladder incontinence, burning urine Ext:   No Joint pain. Leg swelling Skin: No skin rash, easy bruising or bleeding or hives Endoc:  No polyuria, polydipsia , polyphagia or weight change Psych: No depression, insomnia or hallucinations  Other:  All other systems negative  Physical Examination:   VS: BP 118/60 mmHg  Pulse 80  Temp(Src) 98.7 F (37.1 C) (Oral)  Ht 6\' 2"  (1.88 m)  Wt 295 lb (133.811 kg)  BMI 37.86 kg/m2  SpO2 92%  General Appearance: No distress  Neuro:without focal findings, mental status, speech normal, alert and oriented, cranial nerves 2-12 intact, reflexes normal and symmetric, sensation grossly normal  HEENT: PERRLA, EOM intact, no ptosis, no other  lesions noticed; Mallampati 4 Pulmonary: fine crackles on the right lower lobe, no wheezes, no rales. Sputum Production:  none CardiovascularNormal S1,S2.  No m/r/g.  Abdominal aorta pulsation normal.    Abdomen: Benign, Soft, non-tender, No masses, hepatosplenomegaly, No lymphadenopathy Renal:  No costovertebral tenderness  GU:  No performed at this time. Endoc: No evident thyromegaly, no signs of acromegaly or Cushing features Skin:   warm, no rashes, no ecchymosis  Extremities: normal, no cyanosis, clubbing, no edema, warm with normal capillary refill. Other findings:+2 pitting LE edema bilaterally   Labs results: bronchoscopy with BAL (right middle lobe, left lingula) and transbronchial biopsy 12/14/2014 BAL fluid color-cloudy, white Anti-GMFcsf Ab from BAL - pending Fungal culture negative to date Microbiology culture-normal flora  AFB smear-negative AFB culture-negative to date Viral culture-negative to date Pneumocystis smear-negative to date  Blood culture-no growth x5 days  Vasculitis workup 12/14/2014 ANA-negative ANCA negative Antiglomerular basement membrane-negative Complement C3-168 (upper limit of normal) Complement C4-41 ( upper limit of normal)  IgG-649 (low) IgA-273 (normal) IgM-40 (normal) IgE-29 (normal)     Rad results: (The following images and results were reviewed by Dr. Dema SeverinMungal). CT Chest 01/08/15 FINDINGS: Angiographic study: No evidence of a pulmonary embolus. No aortic dissection.  Mediastinum and hila: Heart is normal in size and configuration. Great vessels normal in caliber. No mediastinal or hilar masses. Mildly enlarged right subcarinal lymph node measuring 15 mm in short axis. No other enlarged lymph nodes. There are scattered sub cm nodes. No hilar adenopathy.  Lungs and pleural: Patchy areas of hazy ground-glass opacity are noted mostly in the upper lobes, right greater than left. Minimal left pleural effusion. No lung mass or  suspicious nodule. No pneumothorax.  Limited upper abdomen: Status post cholecystectomy. No acute findings.  Musculoskeletal:  No significant abnormality.  Review of the MIP images confirms the above findings.  IMPRESSION: 1. No evidence of a pulmonary embolus. 2. Patchy areas of ground-glass opacity in both lungs, predominating in the upper lobes, right greater than left. Associated minimal left pleural effusion. No convincing pulmonary edema. Findings consistent with multifocal pneumonia.   ECHO:  12/13/2014 Summary:  1. Left ventricular ejection fraction, by visual estimation, is 50 to  55%.  2. Low normal global left ventricular systolic function.  3. Mild concentric left ventricular hypertrophy.  4. Mildly dilated left atrium.  5. Mildly increased left ventricular internal cavity size.   Assessment and Plan:54 year old male past medical history of end-stage renal disease on hemodialysis, hypertension, diabetes, obesity, suspect sleep apnea, seen as a hospital followup for recurrent pneumonias (4-5 episodes) over the past one year, with persistent bilateral upper and lower lobe opacities. Recurrent pneumonia Ryan Arnold will return pneumonia over the past one year, 4-5 episodes, [facility] which was nonyielding, [facility] was nonyielding. Review of CAT scan shows persistent bilateral upper and lower lobe ground glass opacities, multiple CAT scans of the past 1.5 years. Ryan Arnold with normal echo. Ryan Arnold with normal vasculitis workup. He does have an AV graft for his end-stage renal disease, spoke with nephrology, flows are within the normal limits, putting high output cardiac failure lower on the differential. He did have a previous staph infection in his graft (AV  graft), that was revised and treated appropriately over a year ago.  Given the persistent episodes of bilateral groundglass opacities being treated as recurrent pneumonia I believe at this  time a surgical lung biopsy is warranted, for which I have spoken to Dr. Thelma Barge about and he will see the Ryan Arnold in consultation this week.  Plan: -Continue with supplemental oxygen as needed -Continue to avoid sick contacts -Immunizations up-to-date -followup with cardiothoracic surgery for open lung biopsy evaluation   Dyspnea Multifactorial: Obesity, suspect obstructive sleep apnea, recurrent pneumonias, end-stage renal disease on hemodialysis, hypertension, diastolic heart failure.  Plan: -For his recurrent pneumonia, he was treated for a HCAP, and completed another course of empiric about exerting his inpatient stay. He was discharged on supplemental oxygen to be used as needed fossa saturations less than 88%, for which he is using a portable pulse oximeter or grading his dyspnea subjectively. -his primary care physician has ordered a overnight sleep study, for which Ryan Arnold has completed on 01/13/2015, he'll followup with his primary care physician. -Currently following with nephrology for his end-stage renal disease on hemodialysis. -Optimization of his end-stage renal disease, obstructive sleep apnea, and diastolic heart failure, will be beneficial in treating his dyspnea. -Ryan Arnold is known to have bilateral groundglass opacities which have been persistent on radiographic findings of the past 1.5 years, he has had 2 bronchoscopies done in the last 1.5 years which have not shown any acute cause via microbiology/virology/vasculitis for his groundglass opacities and recurrent pneumonias. He'll be referred for surgical lung biopsy by cardiothoracic surgery (Dr. Thelma Barge).    OSA (obstructive sleep apnea) Currently being worked up by primary care physician. Completed overnight polysomnography on 01/13/2015. Plan-follow with primary care physician for results on OSA study and further management.     Updated Medication List Outpatient Encounter Prescriptions as of 01/15/2015  Medication Sig   . albuterol (PROVENTIL HFA;VENTOLIN HFA) 108 (90 BASE) MCG/ACT inhaler Inhale 108 mcg into the lungs as needed.  [name] allopurinol (ZYLOPRIM) 100 MG tablet Take 100 mg by mouth daily.  [name] amLODipine (NORVASC) 5 MG tablet Take 5 mg by mouth daily.  [name] atorvastatin (LIPITOR) 20 MG tablet Take 20 mg by mouth at bedtime.  [name] buPROPion (WELLBUTRIN) 75 MG tablet Take 75 mg by mouth 2 (two) times daily.  . clonazePAM (KLONOPIN) 0.5 MG tablet Take 0.5 mg by mouth at bedtime as needed.  . DULoxetine (CYMBALTA) 60 MG capsule Take 120 mg by mouth daily.  [name] epoetin alfa (EPOGEN,PROCRIT) 4000 UNIT/ML injection Inject 0.55 mLs into the skin 3 (three) times a week.  . furosemide (LASIX) 40 MG tablet Take 40 mg by mouth 2 (two) times daily.  . furosemide (LASIX) 80 MG tablet Take 80 mg by mouth 3 (three) times a week.  . gabapentin (NEURONTIN) 100 MG capsule Take 100 mg by mouth daily.  . insulin aspart (NOVOLOG) 100 UNIT/ML FlexPen Inject 25 Units into the skin 3 (three) times daily - between meals and at bedtime.  [name] LANTUS SOLOSTAR 100 UNIT/ML Solostar Pen Inject 50 Units into the skin daily.  [name] lisinopril (PRINIVIL,ZESTRIL) 20 MG tablet Take 20 mg by mouth daily.  . magnesium oxide (MAG-OX) 400 MG tablet Take 400 mg by mouth daily.  . metoprolol (LOPRESSOR) 50 MG tablet Take 50 mg by mouth 2 (two) times daily.  . multivitamin (RENA-VIT) TABS tablet Take 1 tablet by mouth daily.  . nitroGLYCERIN (NITROSTAT) 0.4 MG SL tablet Place 0.4 mg under the tongue as needed.  . pantoprazole (PROTONIX)  40 MG tablet Take 40 mg by mouth daily.  . SENSIPAR 30 MG tablet Take 30 mg by mouth daily.  . sevelamer carbonate (RENVELA) 800 MG tablet Take 800 mg by mouth 3 (three) times daily.    Orders for this visit: No orders of the defined types were placed in this encounter.     Thank  you for the consultation and for allowing [facility] to assist in the care of your Ryan Arnold. Our recommendations are  noted above.  Please contact us if we can be of further service.   Stephanie Acre, MD [facility] Office Number: [phone]

## 2015-01-15 NOTE — Patient Instructions (Addendum)
Follow up with Dr. Dema SeverinMungal in 6 weeks - pulmonary function testing and 6 minute walk test prior to follow up - follow up with Dr. [name] as discussed for evaluation of lung biopsy - Dr. [name] Office number is [phone], your appointment is Thursday 01/18/15 at 3.30pm, [facility].  - cont with CPAP at night as directed by your PMD ([facility]).

## 2015-01-18 DIAGNOSIS — G4733 Obstructive sleep apnea (adult) (pediatric): Secondary | ICD-10-CM | POA: Insufficient documentation

## 2015-01-18 DIAGNOSIS — R0600 Dyspnea, unspecified: Secondary | ICD-10-CM | POA: Insufficient documentation

## 2015-01-18 NOTE — Assessment & Plan Note (Signed)
Currently being worked up by primary care physician. Completed overnight polysomnography on 01/13/2015. Plan-follow with primary care physician for results on OSA study and further management.   

## 2015-01-18 NOTE — Assessment & Plan Note (Signed)
Patient will return pneumonia over the past one year, 4-5 episodes, [facility], [facility] was nonyielding. Review of CAT scan shows persistent bilateral upper and lower lobe ground glass opacities, multiple CAT scans of the past 1.5 years. Patient with normal echo. Patient with normal vasculitis workup. He does have an AV graft for his end-stage renal disease, spoke with nephrology, flows are within the normal limits, putting high output cardiac failure lower on the differential. He did have a previous staph infection in his graft (AV graft), that was revised and treated appropriately over a year ago.  Given the persistent episodes of bilateral groundglass opacities being treated as recurrent pneumonia I believe at this time a surgical lung biopsy is warranted, for which I have spoken to Dr. [name] about and he will see the patient in consultation this week.  Plan: -Continue with supplemental oxygen as needed -Continue to avoid sick contacts -Immunizations up-to-date -followup with cardiothoracic surgery for open lung biopsy evaluation

## 2015-01-18 NOTE — Assessment & Plan Note (Signed)
Multifactorial: Obesity, suspect obstructive sleep apnea, recurrent pneumonias, end-stage renal disease on hemodialysis, hypertension, diastolic heart failure.  Plan: -For his recurrent pneumonia, he was treated for a HCAP, and completed another course of empiric about exerting his inpatient stay. He was discharged on supplemental oxygen to be used as needed fossa saturations less than 88%, for which he is using a portable pulse oximeter or grading his dyspnea subjectively. -his primary care physician has ordered a overnight sleep study, for which patient has completed on 01/13/2015, he'll followup with his primary care physician. -Currently following with nephrology for his end-stage renal disease on hemodialysis. -Optimization of his end-stage renal disease, obstructive sleep apnea, and diastolic heart failure, will be beneficial in treating his dyspnea. -Patient is known to have bilateral groundglass opacities which have been persistent on radiographic findings of the past 1.5 years, he has had 2 bronchoscopies done in the last 1.5 years which have not shown any acute cause via microbiology/virology/vasculitis for his groundglass opacities and recurrent pneumonias. He'll be referred for surgical lung biopsy by cardiothoracic surgery (Dr. [name]).

## 2015-01-25 DIAGNOSIS — Y841 Kidney dialysis as the cause of abnormal reaction of the patient, or of later complication, without mention of misadventure at the time of the procedure: Secondary | ICD-10-CM | POA: Diagnosis not present

## 2015-01-25 DIAGNOSIS — I10 Essential (primary) hypertension: Secondary | ICD-10-CM | POA: Diagnosis not present

## 2015-01-25 DIAGNOSIS — T859XXA Unspecified complication of internal prosthetic device, implant and graft, initial encounter: Secondary | ICD-10-CM | POA: Diagnosis not present

## 2015-01-25 DIAGNOSIS — N186 End stage renal disease: Secondary | ICD-10-CM | POA: Diagnosis not present

## 2015-01-25 DIAGNOSIS — E669 Obesity, unspecified: Secondary | ICD-10-CM | POA: Diagnosis not present

## 2015-01-25 DIAGNOSIS — E785 Hyperlipidemia, unspecified: Secondary | ICD-10-CM | POA: Diagnosis not present

## 2015-01-30 ENCOUNTER — Other Ambulatory Visit: Payer: Medicare Other

## 2015-01-30 ENCOUNTER — Other Ambulatory Visit: Payer: Self-pay | Admitting: Cardiothoracic Surgery

## 2015-01-30 ENCOUNTER — Inpatient Hospital Stay
Admit: 2015-01-30 | Disposition: A | Discharge status: Home / Self Care | Payer: Self-pay | Attending: Cardiothoracic Surgery | Admitting: Cardiothoracic Surgery

## 2015-01-30 LAB — COMPREHENSIVE METABOLIC PANEL
ALBUMIN: 3.6 g/dL
ANION GAP: 12 (ref 7–16)
Alkaline Phosphatase: 181 U/L — ABNORMAL HIGH
BILIRUBIN TOTAL: 0.5 mg/dL
BUN: 40 mg/dL — ABNORMAL HIGH
CHLORIDE: 97 mmol/L — AB
CREATININE: 6.96 mg/dL — AB
Calcium, Total: 8 mg/dL — ABNORMAL LOW
Co2: 28 mmol/L
EGFR (African American): 10 — ABNORMAL LOW
EGFR (Non-African Amer.): 8 — ABNORMAL LOW
Glucose: 174 mg/dL — ABNORMAL HIGH
Potassium: 5.2 mmol/L — ABNORMAL HIGH
SGOT(AST): 31 U/L
SGPT (ALT): 38 U/L
SODIUM: 137 mmol/L
TOTAL PROTEIN: 7 g/dL

## 2015-01-30 LAB — BASIC METABOLIC PANEL
Anion Gap: 6 — ABNORMAL LOW (ref 7–16)
BUN: 43 mg/dL — ABNORMAL HIGH
CHLORIDE: 99 mmol/L — AB
Calcium, Total: 7.5 mg/dL — ABNORMAL LOW
Co2: 27 mmol/L
Creatinine: 7.32 mg/dL — ABNORMAL HIGH
EGFR (African American): 9 — ABNORMAL LOW
EGFR (Non-African Amer.): 8 — ABNORMAL LOW
Glucose: 302 mg/dL — ABNORMAL HIGH
POTASSIUM: 6.6 mmol/L — AB
Sodium: 132 mmol/L — ABNORMAL LOW

## 2015-01-30 LAB — CBC WITH DIFFERENTIAL/PLATELET
Basophil #: 0.1 10*3/uL (ref 0.0–0.1)
Basophil #: 0.1 10*3/uL (ref 0.0–0.1)
Basophil %: 1.3 %
Basophil %: 0.9 %
EOS ABS: 0.3 10*3/uL (ref 0.0–0.7)
EOS ABS: 0.1 10*3/uL (ref 0.0–0.7)
EOS PCT: 0.9 %
Eosinophil %: 3.8 %
HCT: 31 % — ABNORMAL LOW (ref 40.0–52.0)
HCT: 29 % — AB (ref 40.0–52.0)
HGB: 10.1 g/dL — ABNORMAL LOW (ref 13.0–18.0)
HGB: 9.2 g/dL — AB (ref 13.0–18.0)
LYMPHS PCT: 31.8 %
LYMPHS PCT: 10.4 %
Lymphocyte #: 2.4 10*3/uL (ref 1.0–3.6)
Lymphocyte #: 1.6 10*3/uL (ref 1.0–3.6)
MCH: 29 pg (ref 26.0–34.0)
MCH: 28.3 pg (ref 26.0–34.0)
MCHC: 32.6 g/dL (ref 32.0–36.0)
MCHC: 31.6 g/dL — ABNORMAL LOW (ref 32.0–36.0)
MCV: 89 fL (ref 80–100)
MCV: 89 fL (ref 80–100)
MONO ABS: 0.6 x10 3/mm (ref 0.2–1.0)
MONOS PCT: 5.6 %
Monocyte #: 0.9 x10 3/mm (ref 0.2–1.0)
Monocyte %: 7.4 %
NEUTROS ABS: 12.7 10*3/uL — AB (ref 1.4–6.5)
Neutrophil #: 4.2 10*3/uL (ref 1.4–6.5)
Neutrophil %: 55.7 %
Neutrophil %: 82.2 %
PLATELETS: 414 10*3/uL (ref 150–440)
Platelet: 377 10*3/uL (ref 150–440)
RBC: 3.49 10*6/uL — ABNORMAL LOW (ref 4.40–5.90)
RBC: 3.24 10*6/uL — AB (ref 4.40–5.90)
RDW: 18.2 % — AB (ref 11.5–14.5)
RDW: 18.7 % — AB (ref 11.5–14.5)
WBC: 7.5 10*3/uL (ref 3.8–10.6)
WBC: 15.5 10*3/uL — AB (ref 3.8–10.6)

## 2015-01-30 LAB — PROTIME-INR
INR: 0.9
Prothrombin Time: 12.7 secs

## 2015-01-30 LAB — APTT: Activated PTT: 32.3 secs (ref 23.6–35.9)

## 2015-01-30 LAB — PHOSPHORUS: PHOSPHORUS: 7 mg/dL — AB

## 2015-01-30 NOTE — Op Note (Signed)
PATIENT NAME:  Ryan Arnold, Ryan Arnold MR#:  [id number] DATE OF BIRTH:  05/07/1961  DATE OF PROCEDURE:  05/04/2012  PREOPERATIVE DIAGNOSES:  1. Complication AV dialysis device.  2. Stage IV renal insufficiency.   POSTOPERATIVE DIAGNOSES:    1. Complication AV dialysis device.  2. Stage IV renal insufficiency.   PROCEDURES PERFORMED:  1. Contrast injection left arm brachiocephalic fistula.  2. Percutaneous transluminal angioplasty to 4 mm, left arm arterial anastomosis.  3. Percutaneous transluminal angioplasty to 8 mm, venous cannulation site.  4. Percutaneous transluminal angioplasty to 8 mm, venous outflow at the level of the deltoid muscle.   SURGEON: Renford Dills, M.D.   SEDATION: Precedex drip with fentanyl IV. Continuous ECG, pulse oximetry, and cardiopulmonary monitoring was performed throughout the entire procedure by the interventional radiology nurse. Total sedation time was one hour.   ACCESS:  1. 6 French sheath, antegrade direction, left arm brachiocephalic fistula.  2. 6 French sheath, retrograde direction, left arm brachiocephalic fistula.   CONTRAST USED: Isovue 30 mL.   FLUOROSCOPY TIME: 4.4 minutes.   INDICATIONS: Ryan Arnold is a 54 year old gentleman who is experiencing a steady decline in his renal function. He has undergone creation of an AV fistula left arm brachiocephalic, but ultrasound surveillance has demonstrated several problems. The risks and benefits of contrast injection and subsequent treatment were reviewed. All questions answered. The patient agrees to proceed.   DESCRIPTION OF PROCEDURE: The patient is taken to Special Procedures and placed in the supine position with his left arm extended palm upward. The left arm is prepped and draped in sterile fashion. 1% lidocaine is infiltrated into the soft tissues overlying the fistula. Because it is up high in the arm at the level of the deltoid in a retrograde direction, ultrasound is used. Ultrasound is  placed in a sterile sleeve. The vein is identified. It is echolucent and easily compressible, indicating patency. Image is recorded and with real-time ultrasound visualization, microneedle is inserted into the vein. Microwire followed by micro sheath, J-wire, and a 6 French sheath. Using a floppy Glidewire and a Kumpe catheter, the wire and catheter are negotiated into the brachial artery. Hand injection of contrast is then used to demonstrate the fistula. Narrowing is noted at the arterial anastomosis. There also is correlation with what appears to be a frozen valve on ultrasound. There is a large side branch and there is also at the level of the deltoid above the area that has been accessed a smooth tapered narrowing. 3000 units of heparin are given. Magic torque wire is advanced into the brachial artery and a 4 x 4 balloon is advanced across the anastomosis and inflated to 12 atmospheres for one minute. Follow-up angiography demonstrates significant improvement in the size of the anastomosis. This appears to have improved inflow and adequately treated moderate to severe narrowing.   1% lidocaine is infiltrated into the soft tissues overlying the fistula in an antegrade direction. Access is obtained with a micropuncture needle, microwire followed by micro sheath, J-wire followed by a 6 French sheath. Magic torque wire is then advanced through the 6 French sheath and first a 7 x 4 balloon is used to angioplasty the frozen valve and then it is advanced and the second separate and distinct lesion is angioplastied. Follow-up imaging demonstrates that these two areas are still quite undersized, and an 8 x 4 balloon is utilized. The frozen valve is well treated with Rival balloon; however, the more proximal lesion does not fully expand and an  8 x 4 Dorado balloon is advanced across this area and inflated to 24 atmospheres. Follow-up angiography demonstrates there is still some mild narrowing at this more proximal  area, but there is rapid flow of contrast and the lumen now measures 7 mm in diameter. However, given this lesion and the potential for it to recur, I did not feel that the side branch should be embolized at this time. We will follow this area with ultrasound and should it continue to be a problem, the side branch could end up being the dominant venous outflow. There does appear to be adequate length of fistula from the antecubital fossa to the level of the side branch that would allow for two buttonholes to be created.   Pursestring sutures of 4-0 Monocryl are placed around both sheaths. Sheaths are removed, light pressure is held, and there are no immediate complication. The patient tolerated the procedure well.   INTERPRETATION: As noted, the fistula is largely of a nice caliber, measuring around 7 to 8 mm in diameter; however, at the anastomosis the anastomosis itself just appears to be somewhat small. This is dilated with great success to 4 mm. Two separate and distinct lesions within the venous portion are dilated ultimately to 8 mm with significant improvement, although the more proximal one at the level of the deltoid still demonstrates a smooth tapering that does not appear to be flow limiting. However, because of this location this side branch aforementioned is going to be left as possible backup outflow.    ____________________________ Renford DillsGregory G. Delfina Schreurs, MD ggs:bjt D: 05/04/2012 14:08:49 ET T: 05/04/2012 14:35:23 ET JOB#: [id number]  cc: Renford DillsGregory G. Addie Alonge, MD, <Dictator> Teena Iraniavid M. Terance HartBronstein, MD Munsoor Lizabeth LeydenN. Lateef, MD Renford DillsGREGORY G Lyniah Fujita MD ELECTRONICALLY SIGNED 05/06/2012 12:26

## 2015-01-31 LAB — BASIC METABOLIC PANEL
Anion Gap: 9 (ref 7–16)
BUN: 28 mg/dL — ABNORMAL HIGH
CALCIUM: 8 mg/dL — AB
CHLORIDE: 100 mmol/L — AB
CO2: 28 mmol/L
Creatinine: 5.29 mg/dL — ABNORMAL HIGH
EGFR (African American): 13 — ABNORMAL LOW
EGFR (Non-African Amer.): 12 — ABNORMAL LOW
GLUCOSE: 202 mg/dL — AB
POTASSIUM: 5.9 mmol/L — AB
SODIUM: 137 mmol/L

## 2015-01-31 LAB — CBC WITH DIFFERENTIAL/PLATELET
Basophil #: 0.1 10*3/uL (ref 0.0–0.1)
Basophil %: 0.8 %
EOS PCT: 0.6 %
Eosinophil #: 0.1 10*3/uL (ref 0.0–0.7)
HCT: 27.7 % — ABNORMAL LOW (ref 40.0–52.0)
HGB: 9.1 g/dL — AB (ref 13.0–18.0)
Lymphocyte #: 2.1 10*3/uL (ref 1.0–3.6)
Lymphocyte %: 17.1 %
MCH: 28.8 pg (ref 26.0–34.0)
MCHC: 32.8 g/dL (ref 32.0–36.0)
MCV: 88 fL (ref 80–100)
MONOS PCT: 10 %
Monocyte #: 1.3 x10 3/mm — ABNORMAL HIGH (ref 0.2–1.0)
Neutrophil #: 9 10*3/uL — ABNORMAL HIGH (ref 1.4–6.5)
Neutrophil %: 71.5 %
PLATELETS: 339 10*3/uL (ref 150–440)
RBC: 3.17 10*6/uL — ABNORMAL LOW (ref 4.40–5.90)
RDW: 18.5 % — ABNORMAL HIGH (ref 11.5–14.5)
WBC: 12.6 10*3/uL — ABNORMAL HIGH (ref 3.8–10.6)

## 2015-01-31 LAB — POTASSIUM: Potassium: 4.9 mmol/L

## 2015-02-02 LAB — CBC WITH DIFFERENTIAL/PLATELET
Basophil #: 0.1 10*3/uL (ref 0.0–0.1)
Basophil %: 0.6 %
Eosinophil #: 0.7 10*3/uL (ref 0.0–0.7)
Eosinophil %: 5.2 %
HCT: 28.6 % — ABNORMAL LOW (ref 40.0–52.0)
HGB: 9.2 g/dL — ABNORMAL LOW (ref 13.0–18.0)
LYMPHS ABS: 1.3 10*3/uL (ref 1.0–3.6)
Lymphocyte %: 10.2 %
MCH: 28.5 pg (ref 26.0–34.0)
MCHC: 32.2 g/dL (ref 32.0–36.0)
MCV: 89 fL (ref 80–100)
MONO ABS: 1.4 x10 3/mm — AB (ref 0.2–1.0)
Monocyte %: 10.5 %
Neutrophil #: 9.7 10*3/uL — ABNORMAL HIGH (ref 1.4–6.5)
Neutrophil %: 73.5 %
Platelet: 387 10*3/uL (ref 150–440)
RBC: 3.23 10*6/uL — ABNORMAL LOW (ref 4.40–5.90)
RDW: 18.2 % — AB (ref 11.5–14.5)
WBC: 13.3 10*3/uL — AB (ref 3.8–10.6)

## 2015-02-02 LAB — POTASSIUM: POTASSIUM: 5 mmol/L

## 2015-02-02 LAB — PHOSPHORUS: Phosphorus: 8.2 mg/dL — ABNORMAL HIGH

## 2015-02-02 NOTE — Op Note (Signed)
PATIENT NAME:  [name], Makael W MR#:  [id number] DATE OF BIRTH:  1961-08-24  DATE OF PROCEDURE:  01/18/2013  PREOPERATIVE DIAGNOSES:  1.  Complication AV fistula, left arm.  2.  End-stage renal disease requiring hemodialysis.   POSTOPERATIVE DIAGNOSES:  1.  Complication AV fistula, left arm.  2.  End-stage renal disease requiring hemodialysis.   PROCEDURES PERFORMED:  1.  Contrast injection, left brachiocephalic fistula.  2.  Percutaneous transluminal angioplasty to 8 mm venous access zone, left brachiocephalic fistula.  3.  Percutaneous transluminal angioplasty and stent placement for failed angioplasty, subclavian-cephalic vein confluence.   SURGEON: Renford DillsGregory G. Schnier, M.D.   SEDATION: Precedex drip.   ACCESS: 8 French sheath antegrade direction, left arm AV fistula.   CONTRAST USED: Isovue 37 mL.  FLUOROSCOPY TIME: 2.8 minutes.   INDICATIONS: The patient is a 54 year old gentleman who has now been started on dialysis and is having increasing problems with his access. The risks and benefits were reviewed. All questions were answered. The patient agrees to proceed with angiography and possible intervention.   DESCRIPTION OF PROCEDURE: The patient is taken to special procedures and placed in the supine position. After adequate sedation is achieved and the left arm is extended palm upward, the left arm is prepped and draped in sterile fashion. Lidocaine 1% is infiltrated in the soft tissues and with a micropuncture needle, access is obtained of the fistula in an antegrade direction. Microwire followed by micro sheath, J-wire followed by a 6 French sheath. Hand injection of contrast is used to demonstrate the fistula. Two high-grade stenoses are noted. One is in the vein. It does appear to be consistent in its morphology with a frozen or sclerotic valve. This is in the zone of venous cannulation. There is a separate and distinct lesion at the cephalic vein confluence. There is a greater  than 80% stenosis extending to the subclavian vein. Heparin 3000 units is given.   Magic Torque wire is then advanced through the lesions and an 8 x 4 Dorado balloon is advanced over the wire. Separate and distinct lesions as described above are treated individually, each with inflations to 14 to 16 atmospheres for 1 minute. Followup angiography demonstrates the sclerotic valve in the venous portion. It is well treated with less than 5% residual stenosis. However, there is minimal improvement at the cephalic confluence. After review of these images, a 10 x 4 mm balloon is advanced across the cephalic confluence and subsequently angioplasty is performed. Again, this does not yield an adequate result with greater than 60% residual stenosis and therefore, a 10 mm x 5 cm Viabahn is deployed across this and postdilated with a 10 balloon. Results following this are excellent with less than 5% residual stenosis. Central veins are then imaged and are noted to be widely patent.   Pursestring suture is placed around the sheath and the sheath is removed. Light pressure is held. There are no immediate complications.    INTERPRETATION: Initial views of the fistula demonstrate 2 tandem lesions, 1 at the cephalic confluence and 1 in the midportion of the fistula. They are separate and distinct. They are treated as noted above with excellent result, although the more proximal one at the cephalic confluence required stenting with a Viabahn stent. There were reflux images done and the arterial portion appears widely patent. The anastomosis is patent.    ____________________________ Renford DillsGregory G. Schnier, MD ggs:jm D: 01/19/2013 12:26:25 ET T: 01/19/2013 13:33:46 ET JOB#: [id number]  cc: Renford DillsGregory G. Schnier, MD, <  Dictator> Teena Irani. Terance Hart, MD [facility] Renford Dills MD ELECTRONICALLY SIGNED 02/21/2013 13:43

## 2015-02-02 NOTE — Consult Note (Signed)
Brief Consult Note: Diagnosis: Major Depressive Disorder, Recurrent, Moderate.   Patient was seen by consultant.   Consult note dictated.   Comments: Discussed with pt and his wife about the risks, benefits and alternative related to Cymbalta. However, he does not want to switch and want to continue the same medication as he feels that he is ready to start the dialysis.He has already tried other meds which were not as effective in the past. He understands the consequences and is capable of making decision.  Electronic Signatures: Rhunette CroftFaheem, Cecilia Vancleve S (MD)  (Signed 14-Jan-14 14:25)  Authored: Brief Consult Note   Last Updated: 14-Jan-14 14:25 by Rhunette CroftFaheem, Kallon Caylor S (MD)

## 2015-02-02 NOTE — Discharge Summary (Signed)
PATIENT NAME:  Ryan Arnold, Ryan W MR#:  Arnold DATE OF BIRTH:  1961-02-06  DATE OF ADMISSION:  10/26/2012 DATE OF DISCHARGE:    PRESENTING COMPLAINT: Uncontrolled sugars, nausea, vomiting.   DISCHARGE DIAGNOSES: 1.  Nonketotic hyperosmolar, uncontrolled diabetes.  2.  Type 1 diabetes on insulin pump.  3.  Right otitis externa/otitis media.  4.  Chronic kidney disease, stage 5, now to be started on hemodialysis.  5.  Morbid obesity.  6.  Depression.   CODE STATUS: Full code.   MEDICATIONS:   1.  Resume medications insulin pump with Humalog.  2.  Lasix 40 mg b.i.d.  3.  Protonix 40 mg b.i.d.  4.  Metoprolol 25 mg b.i.d.  5.  Atorvastatin 20 mg in the morning.  6.  Rena-Vite p.o. daily.  7.  Diovan 160 p.o. daily.  8.  Cymbalta 30 mg delayed release 3 capsules once a day; that comes to 90 mg.  9.  Allopurinol 200 mg p.o. daily.  10.  Ofloxacin 0.3% otic solution 1 drop to affected ears 2 times a day.   DIET: Low sodium, low fat, low cholesterol, carbohydrate controlled diet.   FOLLOWUP:  1.  Follow up with Dr. Terance HartBronstein in 1 to 2 of weeks.  2.  Follow up with Dr. Wynelle LinkKolluru in 1 to 2 weeks.  LABORATORY AND DIAGNOSTIC DATA:  Blood glucose at discharge was 134, 153, 138. BUN 73, creatinine 4.98, sodium 136, potassium 4.8, chloride 105, bicarbonate is 22, calcium 8.6.  Troponin is negative. EKG normal sinus rhythm. Lipase is 114. LFTs within normal limits, except alkaline phosphatase of 197. Urinalysis negative for UTI.  Urine culture no growth in 18 to 24 hours.    CONSULTATIONS: 1.  Renal consult, Dr. Thedore MinsSingh. 2.  Behavioral medicine consult, Dr. Garnetta BuddyFaheem.  BRIEF SUMMARY OF HOSPITAL COURSE:  The patient is a 54 year old morbidly obese African American gentleman with history of type 1 diabetes on insulin pump complicated with diabetic retinopathy, nephropathy and neuropathy with history of hypertension, morbid obesity who comes in with:  1.  Nonketotic hyperosmolar uncontrolled  diabetes   The patient was started on insulin pump, given IV fluids.  His sugars are well under control now. We will turn off insulin drip.  The patient will initiate his insulin pump at home because he does not want to interrupt the calibration and the readings. He is tolerating a diet. No nausea, vomiting.  2.  Right otitis externa/otitis media.  Prophylaxis and ear drops were given.  The pain is much better. The patient is asked to see ENT if symptoms do not get any better.  3.  CKD, nearing end-stage lung disease with creatinine clearance 5.  The patient already has a left arm fistula placed.  The patient wants to start hemodialysis as an outpatient; Dr. Thedore MinsSingh is agreeable with that. The patient will follow up with Dr. Wynelle LinkKolluru as an outpatient.  4.  Hypokalemia resolved after insulin drip was started.  5.  Hypertension continue Diovan.  6.  Anxiety and depression. The patient was recommended to switch his Cymbalta, however, he did not want to.  He understands his risk being on high-dose Cymbalta which is not recommended in renal failure patients.  He was seen by Dr. Garnetta BuddyFaheem from psychiatry.  7.  Sleep apnea. Continue CPAP machine at home.  8.  Hospital stay otherwise remained stable.   CODE STATUS: The patient remained a full code.   TIME SPENT: 40 minutes.     ____________________________ Wylie HailSona A.  Allena Katz, MD sap:ct D: 10/27/2012 13:01:33 ET T: 10/27/2012 14:11:15 ET JOB#: [id number]  cc: Calena Salem A. Allena Katz, MD, <Dictator> Teena Irani. Terance Hart, MD Laverda Sorenson, MD Willow Ora MD ELECTRONICALLY SIGNED 11/05/2012 21:31

## 2015-02-02 NOTE — Op Note (Signed)
PATIENT NAME:  Ryan Arnold, Ryan Arnold MR#:  [id number] DATE OF BIRTH:  Sep 03, 1961  DATE OF PROCEDURE:  04/19/2013  PREOPERATIVE DIAGNOSES: 1.  Complication of arteriovenous dialysis device with poor flow in the arteriovenous fistula. 2.  End-stage renal disease requiring hemodialysis.   POSTOPERATIVE DIAGNOSES:  1.  Complication of arteriovenous dialysis device with poor flow in the arteriovenous fistula. 2.  End-stage renal disease requiring hemodialysis.   PROCEDURES PERFORMED: 1.  Contrast injection, left arm brachiocephalic fistula.  2.  Percutaneous transluminal angioplasty of the arterial anastomosis, left arm brachiocephalic fistula.   SURGEON: Renford DillsGregory G Dearius Hoffmann, M.D.   SEDATION:  Precedex drip with 200 mcg of fentanyl administered IV. Continuous ECG, pulse oximetry and cardiopulmonary monitoring is performed throughout the entire procedure by the interventional radiology nurse. Total sedation time was 1 hour.   ACCESS:   1.  A 5 French micro sheath, antegrade direction.  2.  A 6 French sheath, retrograde direction, both placed directly into the AV fistula.   CONTRAST USED:  Isovue 28 mL.   FLUOROSCOPY TIME:  3.3 minutes.   INDICATIONS: Ryan Arnold is a [age] year old gentleman who presents with poor function of his AV fistula. Risks and benefits for angiography and intervention were reviewed. All questions answered. The patient agrees to proceed.   PROCEDURE: The patient is placed on a Precedex drip then after adequate sedation is achieved, he is brought to the special procedure suite, placed in the supine position and his left arm is extended palm upward. The left arm is prepped and draped in sterile fashion. Lidocaine 1% is infiltrated into the soft tissues near the arterial anastomosis in an antegrade direction, and access to the AV fistula is obtained with a micropuncture needle, microwire followed by micro sheath. Hand injection through the micro sheath is used to demonstrate the  fistula, as well as the central veins. With compression, reflux imaging is utilized to show the arterial anastomosis. After review of the images, there are no lesions more proximally on the arm or within the central veins. The  reflux image with compression does show greater than 80% narrowing at the level of the arterial anastomosis. Therefore, pursestring suture is placed around the micro sheath. This is removed and a 6 [place] sheath is placed in a retrograde direction, accessing the fistula directly more proximally on the arm. Floppy Glidewire and a KMP catheter are then used to negotiate the catheter into the brachial artery, and magnified oblique images are obtained for the arterial anastomosis; 4000 units of heparin are given. Magic Torque wire is advanced and serial dilatation of this stricture is performed going with a 5, a 6 and ultimately a 7 x 2 balloon. Inflations were to 14 atmospheres for 1 minute. Following the 7 mm balloon, there is significant improvement, although there is still modest stenosis at this level. The diameter measurement is approximately 4.5 mm and should, therefore, be adequate to support the fistula. It would be very difficult to place a Viabahn stent at this level, considering how the involvement of the stricture is almost at the level of the suture line.   Having improved the fistula's inflow significantly, pursestring suture is placed around the sheath. The sheath is removed, light pressure is held. There are no immediate complications.   INTERPRETATION: As noted above, the more proximal fistula in the area that is being accessed and the central veins are all widely patent. There is a narrowing noted at the arterial anastomosis, and this is treated with a 7  mm balloon with excellent luminal gain to just under 5 mm in diameter.   SUMMARY: Successful salvage of left arm brachiocephalic fistula.   ____________________________ Renford Dills, MD ggs:dp D: 04/20/2013  13:43:00 ET T: 04/20/2013 14:52:41 ET JOB#: [id number]  cc: Renford Dills, MD, <Dictator>   Renford Dills MD ELECTRONICALLY SIGNED 04/30/2013 11:12

## 2015-02-02 NOTE — H&P (Signed)
PATIENT NAME:  Ryan Arnold, Ryan Arnold MR#:  [id number] DATE OF BIRTH:  1961-03-15  DATE OF ADMISSION:  10/26/2012  PRIMARY CARE PHYSICIAN: Dr. Terance HartBronstein.  NEPHROLOGIST: Dr. Wynelle LinkKolluru.  CHIEF COMPLAINT: "My sugars are uncontrolled."  HISTORY OF PRESENT ILLNESS: The patient is a 54 year old, African American gentleman with multiple medical problems. He has long-standing history of diabetes with diabetic neuropathy and retinopathy on insulin pump for about 2 years, history of congestive heart failure, glaucoma, hypertension and CKD, stage V. He comes to the Emergency Room after he started having nausea and vomiting, not feeling well and his sugars are recorded to be critically high at more than 600. He came to the Emergency Room and was found to have uncontrolled diabetes with sugars in the 600s to 700s and is being admitted for nonketotic hyperosmolar diabetes, uncontrolled diabetes. The patient also was experiencing some right ear pain. On exam of the ear, it looks inflamed and probably has some right external otitis media. The patient is being admitted for further evaluation and management. He is started on IV fluids and insulin pump.   PAST MEDICAL HISTORY:  1.  Diabetes complicated with retinopathy and neuropathy.  2.  Glaucoma.  3.  History of congestive heart failure.  4.  CAD.  5.  CKD, stage V. The patient has a left arm fistula and is nearing hemodialysis.  6.  Sleep apnea, on CPAP.  7.  Hyperlipidemia.  8.  GERD.  9.  Hypertension.  10. Morbid obesity.  11. Depression.   PAST SURGICAL HISTORY:  1.  Right knee surgery.  2.  Gallbladder surgery.  3.  Right shoulder surgery.   MEDICATIONS:  1.  Renovite 1 tablet p.o. daily.  2.  Protonix 40 mg b.i.d.  3.  Metoprolol 25 mg b.i.d.  4.  Humalog insulin pump.  5.  Lasix 40 mg b.i.d.  6.  Diovan 160 p.o. daily.  7.  Cymbalta 90 mg daily.  8.  Atorvastatin 20 mg daily.  9.  Allopurinol 300 mg p.o. daily.  10. Abilify, the patient takes  it daily. He does not remember the dose.   ALLERGIES: PERCOCET.   FAMILY HISTORY: Positive for diabetes and hypertension.   SOCIAL HISTORY:  The patient denies any history of alcohol or tobacco use.   REVIEW OF SYSTEMS:   CONSTITUTIONAL: No fever. Positive for fatigue and weakness.  EYES: No blurred or double vision. Positive for glaucoma.  ENT: Positive for right ear pain. No discharge or hearing loss.  RESPIRATORY: No cough, wheeze, hemoptysis or COPD.  CARDIOVASCULAR: No chest pain, orthopnea or edema.  GASTROINTESTINAL: Positive for nausea and vomiting. No diarrhea. Positive for GERD. No hematemesis.  GENITOURINARY: No dysuria or hematuria.  ENDOCRINE: No polyuria, nocturia or thyroid problems.  HEMATOLOGY: No anemia or easy bruising.  SKIN: No acne or rash.  MUSCULOSKELETAL: Positive for arthritis.  NEUROLOGIC: No CVA or TIA.  PSYCH: Positive for anxiety and depression. All other systems reviewed and negative.   PHYSICAL EXAMINATION:  GENERAL: The patient is awake, alert, oriented x 3.  VITAL SIGNS: Afebrile, pulse 88, blood pressure 136/59 and sats are 96% on room air.  HEENT: Atraumatic, normocephalic. Pupils are PERLA. EOMI intact. Oral mucosa is moist. Right ear canal inflammation present. Given significant pain, could not complete ear exam. Left ear with minimal present.  NECK: Supple. No JVD. No carotid bruit.  LUNGS: Clear to auscultation bilaterally. No rales, rhonchi, respiratory distress or labored breathing.  CARDIOVASCULAR: Both the heart sounds are normal.  Rate and rhythm is regular. PMI not lateralized. Chest is nontender.  EXTREMITIES: Good pedal pulses. Good femoral pulses. No lower extremity edema.  ABDOMEN: Obese, soft and nontender. No organomegaly. Positive bowel sounds.  NEUROLOGIC: Grossly intact cranial nerves II-XII. Peripheral sensory deficits due to diabetic neuropathy.  SKIN: Warm and dry.  PSYCHIATRIC: The patient is awake, alert, oriented x  3.  LABORATORIES: BUN 72, glucose 600, creatinine 5.14, sodium 131, potassium of 5.6, chloride 100, bicarbonate 19. Anion gap is within normal limits, which is 13. The patient's lipase is 114. White count 11.4, hemoglobin and hematocrit 10.7 and 33.2, platelet count 304. Urine is positive 1+, trace ketones, no bacteria or WBCs seen. EKG shows normal sinus rhythm.   ASSESSMENT: A 54 year old patient with history of type 1 diabetes on insulin pump complicated with diabetic retinopathy, neuropathy and nephropathy with history of hypertension, morbid obesity, anxiety and depression comes in with: 1.  Non-ketotic hyperosmolar, uncontrolled diabetes. The patient  will be admitted in the intensive care unit. He is already started on insulin drip. We will continue intravenous fluids. Monitor glucose closely and adjust insulin drip accordingly. The patient will be on the hyperglycemia protocol in the CCU. He will be started on renal, ADA, 2000 calorie diet. Likely etiology for elevated sugars is probably his right otitis externa. The patient likely could have some otitis media too; however, could not visualize this properly secondary to ear pain. Once her sugars are better controlled, we will switch him back to his insulin pump.  2.  Right otitis externa/otitis media. We will start the patient on ofloxacin ear drops. White count is stable. Monitor fever curve. Consider ENT consultation if not any better.  3.  Chronic kidney disease, stage V nearing end-stage renal disease. The patient already has left arm fistula placed. The case was discussed with Dr. Thedore Mins, who will see the patient in consultation. The patient has mild pseudohyponatremia secondary to elevated sugars.  4.  Hyperkalemia in the setting of renal failure and dehydration. Will continue to monitor and give Kayexalate as needed. EKG does not show any elevated peak T waves. We will repeat maybe around 2:00. If potassium remains elevated, give some  Kayexalate. Hopefully by that time, several hours of insulin drip would trend potassium down.  5.  Hypertension. Continue Diovan.  6.  Hyperlipidemia. On atorvastatin.  7.  Anxiety and depression. Continue Cymbalta. The patient is also on Abilify, do not know the dose. We will resume that as well.  8.  Deep vein thrombosis prophylaxis with subcutaneous heparin.  9.  Sleep apnea. Continue CPAP machine. The patient's wife was recommended to bring his CPAP machine. Further workup according to the patient's clinical course. Hospital admission plan was discussed with patient and the patient's wife, who is agreeable to it.   CRITICAL TIME SPENT: 50 minutes.  ____________________________ Wylie Hail Allena Katz, MD sap:aw D: 10/26/2012 08:07:00 ET T: 10/26/2012 08:30:34 ET JOB#: [id number]  cc: Brenden Rudman A. Allena Katz, MD, <Dictator> Teena Irani. Terance Hart, MD Laverda Sorenson, MD Willow Ora MD ELECTRONICALLY SIGNED 11/05/2012 21:31

## 2015-02-02 NOTE — Consult Note (Signed)
PATIENT NAME:  Ryan Arnold, Ryan Arnold MR#:  [id number] DATE OF BIRTH:  1961-03-29  DATE OF CONSULTATION:  10/26/2012  CONSULTING PHYSICIAN:  Jonesha Tsuchiya S. Garnetta Buddy, MD  REASON FOR CONSULTATION: Adjustment of psychotropic medications.   HISTORY OF PRESENT ILLNESS: The patient is a 54 year old large-built African-American male with multiple medical problems including uncontrolled diabetes, was evaluated in the presence of his wife.  He was admitted to the hospital after he was having nausea and vomiting, not feeling well, and his sugars were recorded to be critically high with more than 600 level.  When he came to the Emergency Room, he was found to have uncontrolled diabetes and was admitted for nonketotic hyperosmolar diabetes.  He was also experiencing right ear pain.  His ear looks inflamed, and he was having some right external otitis media. He was admitted to the critical care unit.  He was started on IV fluids and an insulin pump.  The patient's medications were evaluated at that time, and it was noted that the patient is currently taking Cymbalta 90 mg daily for his depression.  Due to his history of chronic kidney disease, the pharmacist suggested that the patient's medications should be adjusted as Cymbalta is not a good medication for him at this time.  Psychiatric consult was placed for the same.   During my interview with the patient, he reported that he has been taking Cymbalta for at least 1 year.  He reported that he has tried several other depression medications in the past.  He reported that none of them were as effective as Cymbalta.  He has tried Zoloft, Prozac, Paxil, Effexor, Lexapro, mirtazapine, and none of them helped.  He reported that since he has been taking Cymbalta his symptoms have improved.  He reported that he was involved in some job-related stress in which he was placed on suspension, and he lost his job, and then he has to restart his job again.  He reported that he has to fight with the  Worker's Compensation to get his job started again.  The patient reported that he has been feeling very depressed for a period of time, but since he has been taking the Cymbalta his symptoms are gradually getting better.  He reported that now his kidneys are getting worse, and the doctors have already told him that his kidney is only functioning at 14%.  He is ready to go for dialysis, and it does not matter if he is taking Cymbalta or any other medications since he will be going for dialysis in either case.  He is not willing to switch the Cymbalta to any other medications.  He discussed at length about other psychotropic medications and reported that when he tried the medications his symptoms got worse.  He reported that he is not having any thoughts to hurt himself.  He reported that he wants to continue functioning as his company is trying to get rid of him, as he costs a lot in medical insurance.  He does not want to get worse after switching his medications.  He currently denied having any sleep issues or any other problems  with his depressive symptoms.  PAST PSYCHIATRIC HISTORY: The patient reported that he has been following with Dr. Imogene Burn for his depressive medications.  He reported that he has tried him on several psychotropic medications, but he has been feeling good on the Cymbalta.  He is not interested in switching his medications.   PAST SURGICAL HISTORY:  1. Right knee surgery.  2. Gallbladder surgery. 3. Right shoulder surgery.  ALLERGIES: PERCOCET.   FAMILY HISTORY: Positive for diabetes and hypertension.   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  He lost a job previously but then is currently back at his work.  He is currently married, and his wife was present in the room.    REVIEW OF SYSTEMS:  CONSTITUTIONAL: The patient is a large-built male who appeared his stated age.  EYES: He currently denies having any problems with his  vision but has history of glaucoma. EARS, NOSE AND THROAT: Positive for right ear pain but no discharge or hearing loss.  RESPIRATORY: He denied any cough, wheezing or COPD. CARDIOVASCULAR: He denied having any chest pain, orthopnea or edema. GASTROINTESTINAL: Positive for nausea and vomiting but no diarrhea.  Positive for GERD. GENITOURINARY: No hematuria or dysuria.  ENDOCRINE: no polyuria, nocturia or thyroid problems.  SKIN: No acne or rash.  MUSCULOSKELETAL: Positive for arthritis, especially in the right knee.  NEUROLOGICAL:  No CVA or TIA. PSYCHIATRIC: Positive for anxiety and depression which is stable at this time.  MENTAL STATUS EXAM: The patient is a large-built male who appeared his stated age.  He was calm and cooperative.  His mood was depressed.  Affect was congruent.  Thought process was logical, goal-directed.  Thought content was non-delusional. He currently denied having any suicidal ideations or plans.  He currently denies having any perceptual disturbances.  He demonstrated fair insight and judgment. His memory was intact.    DIAGNOSTIC IMPRESSION: AXIS I:   1. Major depressive disorder, recurrent, moderate 2. Anxiety disorder, not otherwise specified.   AXIS II:  None.   AXIS III:    1. Diabetes, complicated with retinopathy and neuropathy. 2. Glaucoma. 3. History of congestive heart failure. 4. Coronary artery disease. 5. Chronic kidney disease, stage V. 6. Sleep apnea.  7. Hyperlipidemia. 8. Gastroesophageal reflux disease. 9. Hypertension.  10. Morbid obesity. 11. Depression.  TREATMENT PLAN: I discussed with the patient and his wife at length about the medication, treatment risks, benefits and alternatives. The patient reported that he is already taking Cymbalta and is ready to start the dialysis any time.  It does not matter if he is taking Cymbalta or any other medication since he already has stage V kidney disease.  He does not want to switch the  Cymbalta at this time because he has already tried several other psychotropic medications in the past, and they were not as effective as Cymbalta. He is willing to continue the Cymbalta as prescribed, and he is capable of making decisions.  No medication changes are made at this time.   Thank you for allowing me to participate in the care of this patient.  ____________________________ Ardeen FillersUzma S. Garnetta BuddyFaheem, MD usf:cb D: 10/26/2012 14:35:42 ET T: 10/26/2012 17:20:49 ET JOB#: [id number]  cc: Ardeen FillersUzma S. Garnetta BuddyFaheem, MD, <Dictator> Rhunette CroftUZMA S Margerie Fraiser MD ELECTRONICALLY SIGNED 10/28/2012 11:14

## 2015-02-03 LAB — MISC AER/ANAEROBIC CULT.

## 2015-02-03 NOTE — Consult Note (Signed)
Brief Consult Note: Diagnosis: oropharyngeal dysphagia.   Patient was seen by consultant.   Consult note dictated.   Recommend further assessment or treatment.   Comments: 1.) Dysphagia - clinically and based on swallow eval today, this appears to be OP dysphagia.  - barium swallow reasonable to r/o esophageal pathology but unlikely - no indication for EGD currently and benefit would not outweigh risk - may need PEG if unable to take in nutrition in next few days - not drinking fluid, will need IV hydration.  Electronic Signatures: Dow Adolphein, Jamika Sadek (MD)  (Signed 07-Mar-15 18:24)  Authored: Brief Consult Note   Last Updated: 07-Mar-15 18:24 by Dow Adolphein, Plez Belton (MD)

## 2015-02-03 NOTE — H&P (Signed)
PATIENT NAME:  Ryan Arnold, Ryan Arnold MR#:  [id number] DATE OF BIRTH:  1960/12/09  DATE OF ADMISSION:  12/12/2013  REFERRING PHYSICIAN: Governor Rooks, MD  PRIMARY CARE PHYSICIAN: Dr. Terance Hart  PRIMARY NEPHROLOGIST: Dr. Wynelle Link   CHIEF COMPLAINT: Positive blood cultures, trouble swallowing, sore throat, fever and malaise.   HISTORY OF PRESENT ILLNESS: This is a very nice 54 year old gentleman who has a history of end-stage renal disease, diabetes, coronary artery disease, GERD, hypertension, morbid obesity, hyperlipidemia, and sleep apnea, on CPAP. The patient comes yesterday with the chief complaint of sore throat. The patient had an evaluation by the ER physician. At that moment, his white count was 25,000. He had a temperature of 102 on the 28th; today it is 98.3. He has been tachycardic, a little tachypneic, meeting criteria for sepsis. The patient was yesterday discharged with antibiotics. He had a CT scan of the neck that showed prevertebral ill defined fluid without well defined fluid collection to suggest abscess. This was most consistent with pharyngitis. There was some fullness of the palatine tonsils soft tissue, also related to pharyngitis. The patient was sent home. Today he comes back from the dialysis center because  of positive blood cultures of gram positive cocci in clusters and increased lethargy. For the past 2 weeks the patient says that he has been having a sore throat, getting worse every day to the point that on the 28th it was really bad with a fever of 102. Yesterday was having significant increase in difficulty swallowing. He has not had any chills. He is having now some sputum and cough. For the past couple of days, he has been very lethargic, as per the wife. He has been feeling very short of breath as well. On my examination, the patient has crackles and rales in his lungs. There is no report for x-ray done today. The patient is admitted due to sepsis and bacteremia with  gram-positive cocci.   REVIEW OF SYSTEMS: CONSTITUTIONAL: Positive to fever, fatigue, and weakness. No weight loss or weight gain.  EYES: No blurry vision, double vision. He has diabetic retinopathy.  EARS, NOSE, THROAT: Positive difficulty swallowing. Positive sore throat. Positive snoring. Positive sleep apnea. No sinus pain. Positive postnasal discharge. No tinnitus.  RESPIRATORY: Positive cough. Positive sputum. No wheezing. No hemoptysis. Positive dyspnea.  CARDIOVASCULAR: No chest pain, orthopnea or syncope.  GASTROINTESTINAL: No nausea, vomiting or diarrhea. GENITOURINARY: No dysuria or hematuria. The patient makes good amount of urine. Actually he is on Lasix.  ENDOCRINE: No polyuria, polydipsia, polyphagia, cold or heat intolerance.  HEMATOLOGIC AND LYMPHATIC: No anemia, easy bruising or bleeding.  SKIN: No rashes or petechiae.  MUSCULOSKELETAL: No muscular neck pain. No swelling of joints. Positive gout but no acute exacerbation.  NEUROLOGIC: No CVA or TIA. PSYCHIATRIC: No insomnia. He has depression but it is well controlled.  PAST MEDICAL HISTORY: 1.  Type 2 insulin-dependent diabetes.  2.  Diabetic retinopathy. 3.  Diabetic neuropathy.  4.  Glaucoma.  5.  Congestive heart failure, diastolic, compensated.  6.  Coronary artery disease.  7.  End-stage renal disease, on dialysis Monday, Wednesday, and Friday.  8.  Sleep apnea, on CPAP.  9.  GERD.  10.  Hyperlipidemia.  11.  Morbid obesity.  12.  Hypertension.  13.  Well controlled depression.   PAST SURGICAL HISTORY: 1.  Two AV fistulas. 2.  Right knee surgery.  3.  Cholecystectomy.  4.  Right shoulder surgery.   ALLERGIES: PERCOCET.   FAMILY HISTORY: Positive for  diabetes and hypertension in his mother. No coronary artery disease. No cancer.   SOCIAL HISTORY: The patient has never smoked. He does not drink. He does not use drugs. He lives with his wife.   CURRENT MEDICATIONS: Zofran as needed for nausea, vitamin  D3 50,000 units once a month, Viibryd 40 mg once a day, Rena-Vite once a day, promethazine as needed for nausea, Protonix 40 mg twice daily, metoprolol 50 mg twice daily, ibuprofen 800 mg 3 times daily, Humalog pen for insulin sliding scale, Humalog solution for insulin pump, furosemide 40 mg twice daily, fexofenadine twice daily, FeroSul 325 mg daily, Cymbalta 30 mg daily, Bactrim DS 800/160 mg twice daily, atorvastatin 20 mg daily, aspirin 81 mg daily, amoxicillin with clavulanic once every 12 hours, allopurinol 300 mg daily, acetaminophen with hydrocodone every 6 hours.   PHYSICAL EXAMINATION: VITAL SIGNS: Blood pressure 128/51, pulse in between 100 and 116. Temperature was 102 on February 28th, today is 98.3. Respirations 23. Oxygen saturation 92% on room air.  GENERAL: The patient is alert and oriented x3, in no severe distress, hemodynamically stable.  HEENT: Pupils are equal and reactive. Extraocular movements are intact. Mucosa is moist. Anicteric sclerae. Pink conjunctivae. No oral lesions. No oropharyngeal exudates. There is no deviation of the uvula, no deviation of the soft palate. There is significant erythema at the level of the tonsils with edema. No exudates seen at this moment, but positive mucus from postnasal drip.  NECK: Supple. No JVD. Positive lymphadenopathy at the level of the right neck, anterior triangle. No carotid bruits. Tenderness to palpation at the level of the tonsils. Trachea central.  CARDIOVASCULAR: Regular rate and rhythm, tachycardic. No murmurs, rubs, or gallops are appreciated. No displacement of PMI.  LUNGS: Positive crackles and rhonchi, diffuse in both respiratory fields. No wheezing. No use of accessory muscles.  ABDOMEN: Soft, obese, nontender, nondistended. No hepatosplenomegaly. No masses. No rebound.  GENITALIA: Deferred.  EXTREMITIES: Positive edema +3 of the lower extremities, which is chronic on this patient. He says it is unchanged.  VASCULAR: Pulses  +2. Capillary refill less than 3.  MUSCULOSKELETAL: No joint effusions or joint swelling. No signs of gout exacerbations.  NEUROLOGIC: Cranial nerves II through XII is intact. Strength is 5/5 in all 4 extremities. DTRs +2.  PSYCHIATRIC: No agitation, alert and oriented x3. LYMPHATIC: Negative for lymphadenopathy in supraclavicular area. Positive for lymphadenopathy at the level of the right anterior triangle of the neck.  SKIN: No rash or petechiae.   DIAGNOSTIC DATA: Glucose 284, BUN 28, creatinine 5.03, sodium 135. Alkaline phosphatase 282, AST 48, and ALT 121, which is chronic elevation. White count is 32, hemoglobin 10, and platelet count 308,000.   As a correction, please note that the LFTs were elevated yesterday, but not previously on previous tests, they were normal, so this is new elevation.   Chest x-ray: Pending at this moment.   CT of the neck: As mentioned above.   White blood count is 32,000 and hemoglobin is 10. Blood cultures from yesterday: Positive for gram-positive cocci in clusters. Beta strep throat was negative.   ASSESSMENT AND PLAN: [name] with sepsis, end-stage renal disease, diabetes, hypertension, and coronary artery disease who comes today with worsening symptoms.  1.  Sepsis. The patient comes with source as pharyngitis. No abscess seen on CT scan done yesterday for what no need to repeat it at this moment. The patient has tachycardia, tachypnea, increased white blood cells, and today is afebrile but he had  a fever of 102 on Saturday. Blood cultures are growing gram positive cocci in clusters for what we are going to cover him for possible methicillin-resistant Staphylococcus aureus since the patient has history of dialysis and he has been in the hospital, in and out. Continue vancomycin and Unasyn. Follow up cultures in the morning. If there is worsening pain and worsening swelling consider to repeat a CT scan. For now we are going to give him 1 single  dose of dexamethasone to help with the inflammation, but avoid of steroids due to his diabetes.  2.  End-stage renal disease. Continue dialysis as needed.  3.  Gram-positive cocci bacteremia. The patient is on antibiotics IV. consider PICC line for prolonged antibiotics if this is methicillin-resistant Staphylococcus aureus. If it is streptococcosis from pharyngitis, probably he just needs to switch to oral as soon as his blood cultures are negative. We are going to repeat blood cultures today.  4.  Diabetes. The patient was given 1 dose of steroids. Blood sugars have come in control. Continue insulin sliding scale. Continue insulin pump.  5.  Hypertension. Seems to be stable.  6.  Sleep apnea. Continue CPAP.  7.  Deep vein thrombosis prophylaxis with Heparin.  8.  Gastrointestinal prophylaxis with Protonix.  TIME SPENT: About 50 minutes with this patient today. ____________________________ Felipa Furnaceoberto Sanchez Gutierrez, MD rsg:sb D: 12/12/2013 13:51:25 ET T: 12/12/2013 14:19:49 ET JOB#: [id number]  cc: Felipa Furnaceoberto Sanchez Gutierrez, MD, <Dictator> Khamya Topp Juanda ChanceSANCHEZ GUTIERRE MD ELECTRONICALLY SIGNED 12/13/2013 13:19

## 2015-02-03 NOTE — Discharge Summary (Signed)
PATIENT NAME:  Ryan Arnold, Ryan Arnold MR#:  [id number] DATE OF BIRTH:  09/11/1961  DATE OF ADMISSION:  12/12/2013 DATE OF DISCHARGE:  12/23/2013  ADDENDUM  For a full course and admission, please see earlier dictated discharge summary.    HOSPITAL COURSE:  [facility], but we are unable to transfer the patient because of no availability of beds.  Repeated MRI was done which showed somewhat decrease in the size of the swelling so [facility] specialist suggested to continue the therapy we are giving right now with vancomycin and clindamycin.  The patient was still feeling severe dysphagia and was not able to eat or drink anything.  We called GI to help placement of Dobbhoff tube to help feeding the patient, but the trial failed because of the patient's severe secretions.  The next plan was to try it under general anesthesia by ENT, but the patient was very frustrated and did not want to stay for that and finally he signed against medical advice.   Total time spent in this discharge 35 minutes.    ____________________________ Hope PigeonVaibhavkumar G. Elisabeth PigeonVachhani, MD vgv:ea D: 12/27/2013 20:12:31 ET T: 12/28/2013 01:37:13 ET JOB#: [id number]  cc: Hope PigeonVaibhavkumar G. Elisabeth PigeonVachhani, MD, <Dictator> Altamese DillingVAIBHAVKUMAR Roddrick Sharron MD ELECTRONICALLY SIGNED 01/09/2014 21:55

## 2015-02-03 NOTE — Consult Note (Signed)
Details:   - GI Note:  EGD with dubhoff placement attempted today.   Pt tolerated anesthesia poorly ( hypoxia, excessive secretions, etc) and unable to place dubhoff due to this along with resistance and poor visualization at the UES.    He would be high risk for any more anesthesia.    I think the next best option would be  temporary TPN until he is able to swallow again.  This should be cleared with Dr Sampson GoonFitzgerald or ID since he is being treated for active infection.   Electronic Signatures: Dow Adolphein, [place] Mielke (MD)  (Signed 13-Mar-15 15:37)  Authored: Details   Last Updated: 13-Mar-15 15:37 by Dow Adolphein, Londen Lorge (MD)

## 2015-02-03 NOTE — Consult Note (Signed)
I have seen and examined Ms. Ryan Arnold and agree with Ryan Arnold's a/p.  Ms. Ryan Arnold is reporting severe abdominal pain along with the n/v and is screaming out for pain medication.   She is extremely tender to even light palpiaton in epigastrim, RUQ, LUQ  although this may be sowewhat exaggerated.   has had several similar presentations and had a CT 09/2013 which was unremarkable.     she is currently in severe pain and is very tender to palpation,  I believe it is necessary to proceed with another CT a/p to r/o severe pathology.   this CT is negative, this raises the suspicion that this pain, n/v may be more psychogenic in nature.  cont to follow.    Electronic Signatures: Dow Adolphein, Matthew (MD)  (Signed on 06-Mar-15 18:07)  Authored  Last Updated: 06-Mar-15 18:07 by Dow Adolphein, Matthew (MD)

## 2015-02-03 NOTE — Op Note (Signed)
PATIENT NAME:  Ryan Arnold, Ryan Arnold MR#:  [id number] DATE OF BIRTH:  02-26-1961  DATE OF PROCEDURE:  12/16/2013  PREOPERATIVE DIAGNOSES: Dysphagia and possible posterior pharyngeal abscess.   POSTOPERATIVE DIAGNOSES: Dysphagia with increased secretions in the hypopharynx, but no evidence for lesions or infection.   OPERATIVE PROCEDURE: Flexible laryngoscopy.   SURGEON: Cammy CopaPaul H. Grayden Burley, MD   ANESTHESIA: Topical.   TOTAL ESTIMATED BLOOD LOSS: None.  COMPLICATIONS: None.   DESCRIPTION OF PROCEDURE: The patient was seen at the bedside and gave verbal consent for evaluating his hypopharynx and larynx through flexible laryngoscopy. His nose was sprayed with 4% Xylocaine mixed with Afrin. The flexible scope was lubricated and was passed through his right nostril, as this appeared to be more open. There was mild deviation of the septum, but there was no redness or signs of inflammation on either side. He had just clear mucus in both sides of his nose. The scope was passed easily through his right nostril into the nasopharynx. There was lots of redundant tissue here typical of sleep apnea. Lateral pharyngeal walls collapsed inward and easily closed his nasopharynx. The hypopharynx showed hypertrophied posterior pharyngeal walls from the posterior tonsillar pillar. These were prominent on both sides. The midline raphe was not swollen at all. There was no redness of the hypopharynx or larynx at all. The epiglottis looked normal. There was no swelling of the larynx. Vocal cords were pearly white. False cords were not swollen. Minimal edema of the arytenoids, but he had vocal cord mobility. He could vocalize well. He had a lot of watery saliva secretions in his hypopharynx, but not at the laryngeal inlet. I asked him to swallow and he could swallow voluntarily. His epiglottis flips well, but he does not seem to clear the saliva very well and could have something in the cervical esophagus that is causing the problem  here. There were no other lesions of the larynx or hypopharynx that I could see, and everything else looked good, although there was not a lot of room here typical of sleep apnea.   The patient tolerated the procedure well. This was done at the bedside. There were no operative complications. There was no blood loss.     ____________________________ Cammy CopaPaul H. Juergen Hardenbrook, MD phj:jcm D: 12/16/2013 14:05:00 ET T: 12/16/2013 21:08:15 ET JOB#: [id number]  cc: Cammy CopaPaul H. Paras Kreider, MD, <Dictator> Cammy CopaPAUL H Hanley Rispoli MD ELECTRONICALLY SIGNED 12/26/2013 7:35

## 2015-02-03 NOTE — Op Note (Signed)
PATIENT NAME:  Ryan Arnold, Ryan Arnold DATE OF BIRTH:  Jul 04, 1961  DATE OF PROCEDURE:  03/14/2014  PREOPERATIVE DIAGNOSES:  1.  Complication of left arm arteriovenous fistula.  2.  End-stage renal disease requiring hemodialysis.  3.  Morbid obesity.  4.  Hypertension.   POSTOPERATIVE DIAGNOSES: 1.  Complication of left arm arteriovenous fistula.  2.  End-stage renal disease requiring hemodialysis.  3.  Morbid obesity.  4.  Hypertension.   PROCEDURES PERFORMED:  1.  Contrast injection, left arm brachiocephalic fistula.  2.  Percutaneous transluminal angioplasty of the subclavian cephalic confluence to 10 mm.   SURGEON: Renford DillsGregory G Khup Sapia, M.D.   SEDATION: Precedex drip with IV fentanyl. Continuous ECG, pulse oximetry and cardiopulmonary monitoring is performed throughout the entire procedure by the interventional radiology nurse. Total sedation time is 1 hour.   ACCESS: A 6-French sheath antegrade direction, left brachiocephalic fistula.   CONTRAST USED: Isovue 45 mL.   FLUOROSCOPY TIME: 2.3 minutes.   INDICATIONS: [name] is a 54 year old gentleman, who presents with worsening problems at dialysis. Noninvasive studies as well as physical examination have demonstrated significant stenosis predominantly in the more central area and the patient is undergoing angiography with the hope for intervention. The risks and benefits are reviewed. All questions answered. The patient agrees to proceed.   DESCRIPTION OF PROCEDURE: The patient is taken to special procedures and placed in the supine position. After adequate sedation is achieved, he is positioned supine with his left arm extended palm upward. The left arm is prepped and draped in sterile fashion. Lidocaine 1% is infiltrated in the soft tissues and access to the fistula is obtained in an antegrade direction with a microneedle, microwire followed by micro sheath, J-wire followed 6-French sheath.   Hand injection of contrast  demonstrates the fistula and indeed there are several areas of critical stenosis associated with the subclavian cephalic confluence. Heparin 3000 units are given and a Magic torque wire is advanced. Initially, a 5 x 4 Dorado balloon is used to angioplasty the lesions. Then, a 6 x 100 Lutonix balloon is used to angioplasty the lesions. These inflations are for 1 minute. Subsequently, an 8 x 4 Dorado is used and angioplasties are performed for 2 minutes at 18 to 20 atmospheres. Finally, a 10 x 4 Dorado is utilized, and again angioplasty is performed from 16 to 18 atmospheres each inflation for approximately 2 minutes. Followup imaging demonstrates there is less than 10% residual stenosis throughout the confluence with rapid flow and clinically the fistula now has much less pulsatility and a more continuous thrill. With the 8 mm balloon inflated, contrast was refluxed and the arterial portion and visualized portions of the brachial artery appear smooth and widely patent.   Pursestring suture is placed and the sheath is removed. There is no immediate complications.   INTERPRETATION: Initially, there are 2 areas within the subclavian cephalic confluence, one just at the confluence itself and one associated with the previously placed Viabahn stent. These are both greater than 90%. Following angioplasty in a serial fashion, there is essentially resolution of the lesions. The mid and proximal portions of the fistula are widely patent in the arterial portion, although somewhat tapered at the anastomosis appears to be 4 to 5 mm in diameter and smooth and widely patent. Visualized portions of the brachial artery are widely patent. The central veins are widely patent.   SUMMARY: Successful salvage of left arm brachiocephalic fistula as described above.  ____________________________ Renford DillsGregory G. Decorian Schuenemann,  MD ggs:aw D: 03/15/2014 09:43:05 ET T: 03/15/2014 09:51:25 ET JOB#: [id number]  cc: Renford Dills, MD,  <Dictator> Renford Dills MD ELECTRONICALLY SIGNED 03/28/2014 9:17

## 2015-02-03 NOTE — Consult Note (Signed)
Brief Consult Note: Diagnosis: Retropharyngeal abscess.   Comments: I have reviewed the CT scans of the neck and chest remotely but have not seen the patient.  Discussed by phone with Dr. Elisabeth PigeonVachhani.  CT of the chest done 3 days ago does not demonstrate any intrathoracic extension of retropharyngeal process.  There are scattered nodules in the lung that should be followed in several weeks with repeat CT of the chest.  I do not see any intrathoracic process that would require thoracic surgery intervention.  Electronic Signatures: Jasmine Decemberaks, Neftaly Inzunza E (MD)  (Signed 09-Mar-15 16:38)  Authored: Brief Consult Note   Last Updated: 09-Mar-15 16:38 by Jasmine Decemberaks, Markas Aldredge E (MD)

## 2015-02-03 NOTE — Consult Note (Signed)
PATIENT NAME:  Ryan Arnold, Ryan Arnold MR#:  [id number] DATE OF BIRTH:  1961-04-21  DATE OF CONSULTATION:  12/20/2013  REFERRING PHYSICIAN: Dr. Elisabeth PigeonVachhani  CONSULTING PHYSICIAN:  Sheppard Plumberimothy E. Tremaine Earwood, MD  REASON FOR CONSULTATION: Retropharyngeal abscess.   HISTORY: I have personally seen and examined Mr. Ryan OxfordMyron Arnold. I have interviewed him and his wife. I have discussed his care with Dr. Elisabeth PigeonVachhani.   This gentleman is a 54 year old African American man, who was admitted to the hospital with an altered mental status on 12/12/2013. At the time, he was noted to have difficulty swallowing, fever, chills and malaise. He was admitted with positive blood cultures for MRSA and ultimately was found to have an infected AV fistula in his left upper extremity as well as a severe pharyngitis. Blood cultures grew methicillin-resistant staph aureus. The patient was admitted to the hospital where he underwent multiple evaluations over the course of the last week. These have included CT scans of his neck, chest and abdomen. These have shown a retropharyngeal soft tissue swelling with perhaps some fluid collection, but no evidence of intrathoracic spread along the prevertebral space. The CT scans did reveal multiple bilateral pulmonary nodules with some cavitation and a few of these suggesting an infectious or inflammatory process.   The patient has been seen by our ear, nose, and throat specialist and they have recommended continuation of his intravenous vancomycin. There is the possibility of performing a percutaneous drain, although the area in question does not appear to be amenable to that approach.   The patient states that he does have some pain on swallowing. He did undergo a modified barium swallow and was unable to swallow sufficiently because of some extrinsic compression on the esophagus. There is evidence of aspiration on the CT scan. Since he has been here, he thinks that his sore throat has improved. He does complain  of pain with swallowing his secretions. He thinks that this may actually be improving.   PAST MEDICAL HISTORY: Significant for end-stage renal disease on dialysis. He is currently being treated with a right internal jugular catheter. He has a history of diabetes, coronary disease, reflux, hypertension, morbid obesity, hyperlipidemia and sleep apnea.   PHYSICAL EXAMINATION:  GENERAL: He is awake, alert and appropriate. He does not appear to be toxic or septic.  NECK: Supple. There is mild tenderness to palpation. There are no masses. He has a well-healed scar from his right internal jugular catheter.  LUNGS: Clear, but distant bilaterally.  HEART: Regular.  EXTREMITIES: He has a dressing applied to his AV fistula on his left upper extremity.   ASSESSMENT AND PLAN: I have independently reviewed the patient's CT scans. I do see the retropharyngeal inflammatory process. I did not see any extension into the mediastinum. There is no evidence of mediastinitis. There are multiple bilateral pulmonary lesions that are consistent with either inflammatory or infectious etiologies.   IMPRESSION/RECOMMENDATIONS: I have explained to the patient that I did not feel that there was any need for any surgical intervention from my standpoint. I will allow the ear, nose and throat specialist to determine whether or not he needs his neck drained. I did give the patient my business card and told him I would like to see him back in approximately 6 weeks for another CT scan of the chest. The patient will contact our office to arrange for that. I think that these should be followed until they  completely resolve. He understands my concerns.   At the present time,  it would appear that the patient is going to be perhaps scheduled for transfer to an outside facility. If that does occur, I would be happy to see the patient as an outpatient in my clinic. Please arrange for appropriate followup.    ____________________________ Sheppard Plumber. Thelma Barge, MD teo:aw D: 12/21/2013 08:09:44 ET T: 12/21/2013 08:35:15 ET JOB#: [id number]  cc: Marcial Pacas E. Thelma Barge, MD, <Dictator> Jasmine December MD ELECTRONICALLY SIGNED 12/26/2013 8:23

## 2015-02-03 NOTE — Op Note (Signed)
PATIENT NAME:  Ryan Arnold, Narciso W MR#:  [id number] DATE OF BIRTH:  [birth year]  DATE OF PROCEDURE:  12/14/2013  PREOPERATIVE DIAGNOSES: 1.  Complication arteriovenous dialysis device with infection and hematoma of left arm fistula.  2.  End-stage renal disease requiring hemodialysis.  3.  Hypertension.  4.  Obesity.   POSTOPERATIVE DIAGNOSES:  1.  Complication arteriovenous dialysis device with infection and hematoma of left arm fistula.  2.  End-stage renal disease requiring hemodialysis.  3.  Hypertension.  4.  Obesity.   PROCEDURE PERFORMED: Insertion of right internal jugular cuffed tunneled dialysis catheter.   SURGEON: Renford DillsGregory G. Schnier, M.D.   SEDATION: Versed 4 mg plus fentanyl 150 mcg administered IV. Continuous ECG, pulse oximetry and cardiopulmonary monitoring is performed throughout the entire procedure by the interventional radiology nurse. Total sedation time is 40 minutes.   ACCESS: Right IJ.   FLUOROSCOPY TIME: Less than 1 minute.   CONTRAST USED: None.   INDICATIONS: [name] is a 54 year old gentleman, maintained on hemodialysis, who sustained a hematoma of his left arm fistula. He has subsequently developed some purulent drainage as well. This therefore will require prolonged antibiotic therapy and rest of the access until it is healed. The risks and benefits for insertion of a catheter were reviewed. The patient has agreed to proceed.   DESCRIPTION OF PROCEDURE: The patient is taken to the cardiac catheterization suite and  placed in the supine position. After adequate sedation is achieved, the right neck is prepped and draped in sterile fashion. Ultrasound is placed in a sterile sleeve. Ultrasound is utilized secondary to lack of appropriate landmarks and to avoid vascular injury. Under direct visualization jugular vein is identified. It is echolucent and compressible indicating patency. Image is recorded for the permanent record.   Then 1% lidocaine is infiltrated  in the soft tissues, at the base of the neck, as well as the chest wall. Access to the vein is obtained with a micropuncture needle, microwire followed by microsheath. Microwire is then positioned with its tip at the atriocaval junction and then measured. It comes out to be 21 cm and therefore a 23 cm tip-to-cuff catheter is opened onto the field. J-wire is advanced under fluoroscopy into the inferior vena cava. Counterincision is created at the wire insertion site. Dilator is passed over the wire, and the dilator and peel-away sheath is inserted. Wire and dilator are removed and the 23 cm tip-to-cuff Cannon catheter is advanced through the peel-away sheath. The peel-away sheath is removed. The catheter is positioned so that the tips are at the atriocaval junction. It is then approximated to the chest wall. Exit site is selected. A small incision is made with the 11 blade. Tunneling device is passed subcutaneously from the exit site to the neck counterincision and then the tract is dilated. The catheter is pulled subcutaneously and positioned so that the tips are at the proper location under fluoroscopic guidance. The catheter is then transected and the hub is connected without difficulty. Both lumens aspirate and flush easily and under fluoroscopy the catheter has a smooth contour, free of kinks, with the tips in good position. Then 5000 units of heparin per lumen is then packed. Catheter is secured to the chest wall with 0 silk. The neck counterincision is closed with 4-0 Monocryl and Dermabond. Sterile dressing is applied. The patient tolerated the procedure well. There were no immediate complications.  ____________________________ Renford DillsGregory G. Schnier, MD ggs:sb D: 12/16/2013 09:46:00 ET T: 12/16/2013 10:17:35 ET JOB#: [id number]  cc: Renford Dills, MD, <Dictator> Munsoor Lizabeth Leyden, MD Renford Dills MD ELECTRONICALLY SIGNED 01/03/2014 18:47

## 2015-02-03 NOTE — Consult Note (Signed)
PATIENT NAME:  Ryan Arnold, Ryan W MR#:  [id number] DATE OF BIRTH:  Aug 01, 1961  DATE OF CONSULTATION:  12/16/2013  REFERRING PHYSICIAN:  Dr. Elisabeth PigeonVachhani  CONSULTING PHYSICIAN: Cammy CopaPaul H. Adamaris King, M.D.   REASON FOR CONSULTATION: Inflammation of pharynx and swelling in the prevertebral area.   HISTORY OF PRESENT ILLNESS: The patient is a 54 year old African American male who had end-stage renal disease on hemodialysis with a shunt in his left arm. He has got diabetes, coronary artery disease, GERD, hypertension, obesity and sleep apnea on CPAP. Over the last 3 weeks, he has complained of a little bit of soreness in his throat, almost like a mild sore throat or infection. He did not mention it at first because it was not much and he thought it would go away on its own. Over the last couple of weeks, it seems like it has worsened. He had a CT scan done a week ago that showed a little bit of prevertebral swelling, but nothing else. He has had  increase in white count and fever, was noted to have a staph bacteremia that is felt to be coming from an infected left upper arm shunt for dialysis. He has been on IV vancomycin, and his fever and temperature has been coming down. He still complains of soreness in the throat and dysphagia. He is drinking mostly liquids, not getting solid food down much. Even liquids sometimes feel like he is choking himself. He constantly sounds like he is kind of wet in his throat. He has never had any major throat problems in the past.     PAST MEDICAL HISTORY: Significant for type 2 diabetes mellitus, diabetic retinopathy, diabetic neuropathy, glaucoma, congestive heart failure, diastolic, compensated; coronary artery disease, end-stage renal disease on dialysis Monday, Wednesday, Friday; sleep apnea on CPAP, GERD, hyperlipidemia, morbid obesity, hypertension and well-controlled depression.   DRUG ALLERGIES:  PERCOCET.   FAMILY HISTORY: Positive for diabetes and hypertension in his mother.  No coronary artery disease. No cancer.   SOCIAL HISTORY: The patient has never smoked. Does not drink alcohol or use drugs.   CURRENT MEDICATIONS: As noted in the chart and reviewed.   PHYSICAL EXAMINATION: GENERAL: The patient is awake and alert and very cooperative.  HEENT:  He describes the pain in his neck as being deep-seated, kind of deep to his larynx. There is no pinpoint localization. He has no redness of his neck and there is no neck nodes palpable. The oropharynx shows a normal, soft palate but is very long, and he has got very little room in his throat, typical of obstructive sleep apnea. He had no redness in his nose and no sign of purulence on either side. No redness in the back of his throat or any deviation. He still has tonsils evident, but they are relatively flat. No exudate.   Flexible laryngoscopy is done and this is dictated in detail elsewhere. His nose was sprayed and passed through the right nostril as this was more open. There is no sign of any purulence in the nose or sinusitis. The nasopharynx has lots of redundant tissue typical of sleep apnea. You can see hypertrophied posterior tonsillar pillars and lateral pharyngeal walls that tend to collapse inward. The epiglottis is normal size and shape, sits in its normal position at the base of tongue.  His vocal cords are pearly white, move well. There is no obvious signs of redness or infection anywhere. There is no unusual swelling in the posterior pharyngeal wall in the midline, although  his posterior tonsillar pillars are thickened, more likely because of sleep apnea than anything else. He has got a lot of secretions around his hypopharynx. He can swallow on command and his epiglottis flips well; however, he is not clearing his mucus very well and could certainly have something in the cervical esophagus just behind the larynx causing some of these symptoms. No lesions are noted anywhere in the larynx or hypopharynx. His airway is  clear.   The patient tolerated the procedure well. This was done at the bedside.    IMPRESSION: The patient has dysphasia and some swelling noted in the prevertebral area and some discomfort here. I do not see any signs of obvious infection or redness or cellulitis anywhere. I do not see any lesions anywhere either.  He is having some difficulty getting his food down and he seems to drink more liquids and not solids.  I think he should have a modified barium swallow to see what is going down and see if this will show the cervical esophageal area and whether there is potentially some swelling or challenges here that are leading to some of the dysphagia. He has very hypertrophied muscles in the back of his throat because of the obstructive sleep apnea, and this may lead to suspicion of thicker tissues on CT scan. He needs to remain on his CPAP long term as well, as he has almost no room in his nasopharynx and hypopharynx that has led to the sleep apnea.  There is no specific medication recommended at this time, as I do not see any obvious source of discomfort in his throat right now.    ____________________________ Cammy Copa, MD phj:dmm D: 12/16/2013 14:01:00 ET T: 12/16/2013 20:50:35 ET JOB#: [id number]  cc: Cammy Copa, MD, <Dictator>   Cammy Copa MD ELECTRONICALLY SIGNED 12/26/2013 7:35

## 2015-02-03 NOTE — Consult Note (Signed)
PATIENT NAME:  Ryan Arnold, Ryan Arnold MR#:  [id number] DATE OF BIRTH:  1961-02-21  DATE OF CONSULTATION:  12/17/2013  REFERRING PHYSICIAN:  Dr. Wynelle LinkKolluru CONSULTING PHYSICIAN:  Dow AdolphMatthew Kimba Lottes, MD  REASON FOR THE CONSULTATION:  Dysphagia.   HISTORY OF PRESENT ILLNESS:  [name] is a very sick 54 year old male with a past medical history notable for end-stage renal disease, congestive heart failure, morbid obesity, type 2 diabetes who initially presented to the hospital for evaluation of dysphagia, sore throat, fevers and chills.  During the course of his hospitalization he was found to be positive for MRSA in his blood.  In addition, he was found to have some fluid collection in his neck in the prevertebral area along with several spots in his lungs suggestive of infectious foci.   GI is currently consulted for evaluation of dysphagia.  [name] reports that whenever he tries to swallow anything including liquids he starts coughing and choking.  He did undergo a swallow evaluation today and did have confirmed aspiration and what appeared to be oropharyngeal dysphagia on this study.   He has not had any issues with food getting stuck in his chest or with liquids getting stuck in his chest.  He has not had any bleeding.  Prior to this he has not had any trouble with dysphagia and does not believe he has ever had an upper endoscopy.   PAST MEDICAL HISTORY: 1.  DM 2.  2.  CHF.  3.  Coronary disease.  4.  End-stage renal disease.  5.  Sleep apnea.  6.  GERD.  7.  Morbid obesity.  8.  Hypertension.  9.  Depression.   HOME MEDICATIONS:  He is on: 1.  Zofran as needed.  2.  Viibryd 40 mg daily.  3.  Rena-Vite daily.  4.  Promethazine as needed. 5.  Protonix 40 mg twice a day.  6.  Metoprolol 50 mg daily.  7.  Humalog pen sliding scale.  8.  Furosemide 40 mg twice a day.  9.  Fexofenadine twice a day.  10.  FeroSul 325 mg daily.  11.  Cymbalta 30 mg daily.  12.  Bactrim double strength 800/160  twice a day.  13.  Atorvastatin 20 mg daily.  14.  Aspirin 81 mg daily.  15.  Augmentin q. 12 hours.  16.  Allopurinol 300 mg daily.   REVIEW OF SYSTEMS:  A 10 system review was conducted.  It is negative except as stated in the HPI.   FAMILY HISTORY:  He denies any history of GI malignancy.   SOCIAL HISTORY:  He denies smoking or alcohol.  He lives at home with his wife.   PHYSICAL EXAMINATION: VITAL SIGNS:  His current temperature is 97.6, pulse is 91, respirations are 19, blood pressure is 176/81, pulse ox is 92% on room air.   GENERAL:  A chronically ill-appearing male, overweight, somewhat somnolent. HEENT:  Normocephalic/atraumatic. Extraocular movements are intact. Anicteric. NECK:  Soft, supple. JVP appears normal. No adenopathy. CHEST:  Clear to auscultation. No wheeze or crackle. Respirations unlabored. HEART:  Regular. No murmur, rub, or gallop.  Normal S1 and S2. ABDOMEN:  Obese abdomen, some distention, although certainly not taut.  Normoactive bowel sounds.  No rebound or guarding. EXTREMITIES:  2+ edema bilaterally. SKIN:  No rash or lesion. Skin color, texture, turgor normal. NEUROLOGICAL:  Grossly intact. PSYCHIATRIC:  Normal tone and affect. MUSCULOSKELETAL:  No joint swelling or erythema.   LABORATORY DATA:  See HPI.  ASSESSMENT AND PLAN:  Dysphagia:  Based on the description of choking and coughing even with liquids and also the modified barium swallow study this is very likely to be oropharyngeal dysphagia.  I suspect this is a combination of both the paravertebral fluid collection along with just chronically ill and acutely ill state of this patient.   RECOMMENDATIONS:  I would not perform an upper endoscopy for diagnostic purposes at this time.  This is because this seems all to be consistent with oropharyngeal dysphagia.  I do think a barium swallow is reasonable to rule out any potential esophageal etiology although I think this is very unlikely.   If he  continues to be unable to eat or drink anything over the next few days, we will likely need to consider PEG placement.  His wife seems to be quite anxious about him not eating and seems she would be in favor of proceeding with PEG placement.  He will continue to work with speech and swallow and we will see how he progresses.  He may need some IV fluid in the meantime.   Thank you for this consultation.    ____________________________ Dow Adolph, MD mr:ea D: 12/17/2013 21:07:53 ET T: 12/18/2013 03:29:09 ET JOB#: [id number]  cc: Dow Adolph, MD, <Dictator> Kathalene Frames MD ELECTRONICALLY SIGNED 01/05/2014 9:34

## 2015-02-03 NOTE — Consult Note (Signed)
Brief Consult Note: Diagnosis: Dysphagia, sepsis, bacteremia.   Comments: Went to see patient, but he is in the middle of dialysis. I reviewed his hospital course thus far, including ENT eval, labs, imaging. Patient has ongoing dysphagia/odynophagia with gram+cocci bacteremia/sepsis. He has been receiving IV antibiotics which have corrected his fever, but has not improved his swallowing troubles.  CT chest notes b/l pneumonitis with several small non-specific nodules. US abdomen notes fatty infiltration of the liver/hepatocellular disease CT neck notes progressive pre-vertebral fluid without definite abscess, suggestive of possible pharygitis. ?  WBC elevated but slightly improving/at least staying stable. afebrile. hyperglycemic.  laryngoscopy negative for erythema, but there was evidence of ineffective swallowing. MBS ordered.  We will await MBS findings and with exam the patient once he returns from dialysis with further recommendations.  will follow. Dictation to follow once patient is seen.  Electronic Signatures: Ashok Cordiaarle, Kayly Kriegel M (PA-C)  (Signed 06-Mar-15 15:56)  Authored: Brief Consult Note   Last Updated: 06-Mar-15 15:56 by Ashok CordiaEarle, Cristen Bredeson M (PA-C)

## 2015-02-03 NOTE — Consult Note (Signed)
PATIENT NAME:  Ryan Arnold, Ryan Arnold MR#:  [id number] DATE OF BIRTH:  November 12, 1960  DATE OF CONSULTATION:  12/14/2013  REFERRING PHYSICIAN:  Dr. Luberta Mutter CONSULTING PHYSICIAN:  Stann Mainland. Sampson Goon, MD  REASON FOR CONSULTATION:  Staphylococcus aureus bacteremia and infected AV fistula.   HISTORY OF PRESENT ILLNESS:  This is a pleasant 54 year old gentleman with end-stage renal disease on dialysis through a left upper extremity AV fistula.  He was admitted March 1st with positive blood cultures for gram-positive cocci.  The cultures were done on February 28th when the patient presented to the Emergency Room with complaints of a sore throat over several days.  He had a CT scan of his neck that showed evidence of pharyngitis with possible early abscess, but negative strep test.  He was discharged on oral antibiotics.  He was called once his blood cultures were noted to be positive.  He had repeat blood cultures March 2nd that have been no growth to date.  He also had been complaining of some pain and drainage from his left AV fistula.  He had been getting dialysis through a buttonhole at that site.   Since admission, the patient has had a new temporary hemodialysis catheter placed.  He was treated at dialysis.  He currently denies any fevers or chills.  He does have some pain at the AV fistula site.   PAST MEDICAL HISTORY: 1.  End-stage renal disease.  2.  Morbid obesity.  3.  OSA.  4.  Coronary artery disease.  5.  Type 2 diabetes.  6.  GERD.  7.  Hypertension.  8.  Hyperlipidemia.  9.  CHF. 10.  Glaucoma.   PAST SURGICAL HISTORY:  AV fistula surgery, right knee surgery, cholecystectomy, right shoulder surgery.   ALLERGIES:  THE PATIENT IS ALLERGIC TO PERCOCET.   FAMILY HISTORY:  Positive for diabetes and hypertension.   SOCIAL HISTORY:  The patient lives with his wife.  He does not smoke, drink or use drugs.    ANTIBIOTICS SINCE ADMISSION:  Include vancomycin begun on March 2nd.  He also has  received a dose of Ancef for placement of his temporary hemodialysis catheter.   OTHER MEDICATIONS:  Include Tylenol, Vicodin, allopurinol, aspirin, atorvastatin, vitamin D3, Colace, Cymbalta, ferrous sulfate, Lasix, heparin, Dilaudid, metoprolol, Zofran, pantoprazole, senna, alprazolam, Abilify, insulin sliding scale, Benadryl, hydralazine, lorazepam, nitroglycerin, PHYSICAL EXAMINATION: VITAL SIGNS:  T-max since admission on March 1st is 98, pulse is 79, blood pressure 150/78, pulse ox 90%.  GENERAL:  He is morbidly obese sitting up in bed in no acute distress.  HEENT:  Pupils equal, round, reactive to light and accommodation.  Extraocular movements are intact.  Sclerae are anicteric.  His oropharynx is clear.  NECK:  Supple.  HEART:  Has distant heart sounds, but regular.  LUNGS:  Clear.  ABDOMEN:  Obese, soft, nontender.  EXTREMITIES:  1+ edema bilateral lower extremities, left upper extremity AV fistula has a buttonhole area that is draining some purulence that I can express.  There is warmth and some mild tenderness.  NEUROLOGIC:  He is alert and oriented x 3.  Grossly nonfocal neuro exam.   LABORATORY DATA:  Labs are reviewed.  Blood cultures March 1st growing MRSA two of two bottles.  Blood cultures from March 2nd are no growth to date.  Urine culture is negative.  Beta hemolytic strep throat culture is negative.  White blood count on admission was 25 on the 1st.  It peaked at 32 on March 2nd and is  currently down to 17.9, hemoglobin 12.0, platelets 265.  Renal function is consistent with end-stage renal disease.  Urinalysis showed 38 white cells.   IMAGING STUDIES:  X-ray of the neck showed moderate retropharyngeal soft tissue swelling.  CT of the neck March 1st shows mild soft tissue fullness of the palate, tongue and tonsils.  There is an ill-defined fluid beginning and within the prevertebral airspace at the level of the low oral pharynx just below the piriform sinus at C6 to 7.  There is  no evidence of a drainable abscess.  Chest x-ray March 2nd revealed atelectasis right infrahilar region.  Abdominal ultrasound for elevated LFTs shows coarse echotexture of hepatic parenchyma noted suggested fatty infiltrate or diffuse hepatocellular disease.   IMPRESSION:  A 54 year old end-stage dialysis patient who has methicillin resistant Staphylococcus aureus bacteremia and a left AV fistula, cellulitis and drainage from a buttonhole site.  He has negative blood cultures from March 2nd and has had a temporary catheter placed.  He does continue to have some drainage from his AV fistula.  Clinically, he is improving since admission.   RECOMMENDATIONS: 1.  Continue vancomycin.  2.  Stop Unasyn.  3.  Continue to evaluate the left AV fistula.  He may need ultrasound to make sure there is no drainable abscess.  I have deferred to vascular surgery and renal for further evaluation of the site.  4.  He will likely need at least four weeks of IV vancomycin which he can get at hemodialysis.  His total duration will depend on the response at the fistula site as well as any other interventions for the fistula.   Thank you for the consultation.  Would be glad to follow with you.    ____________________________ Stann Mainlandavid P. Sampson GoonFitzgerald, MD dpf:ea D: 12/14/2013 22:13:25 ET T: 12/14/2013 23:20:40 ET JOB#: [id number]  cc: Stann Mainlandavid P. Sampson GoonFitzgerald, MD, <Dictator> DAVID Sampson GoonFITZGERALD MD ELECTRONICALLY SIGNED 12/19/2013 19:14

## 2015-02-03 NOTE — Discharge Summary (Signed)
Dates of Admission and Diagnosis:  Date of Admission 12-Dec-2013   Date of Discharge 20-Dec-2013   Admitting Diagnosis Sepsis   Final Diagnosis MRSA bacteremia- Infected AV fistula- no localized abscess around fistula- presented with sepsis on 12/11/13, blood cx negative from 12/12/13- advised to have 6 weeks vancomycin from then. ESRD on HD- now with permacath Fluid collection prevertebral space- at c6-7 level- causing severe dysphagia and odynophasia- Need further management at tertiary care center- likely drainage DM Htn hyperlipidemia    Chief Complaint/History of Present Illness Positive blood cultures, trouble swallowing, sore throat, fever and malaise.  HISTORY OF PRESENT ILLNESS: This is a very nice 54 year old gentleman who has a history of end-stage renal disease, diabetes, coronary artery disease, GERD, hypertension, morbid obesity, hyperlipidemia, and sleep apnea, on CPAP. The patient comes yesterday with the chief complaint of sore throat. The patient had an evaluation by the ER physician. At that moment, his white count was 25,000. He had a temperature of 102 on the 28th; today it is 98.3. He has been tachycardic, a little tachypneic, meeting criteria for sepsis. The patient was yesterday discharged with antibiotics. He had a CT scan of the neck that showed prevertebral ill defined fluid without well defined fluid collection to suggest abscess. This was most consistent with pharyngitis. There was some fullness of the palatine tonsils soft tissue, also related to pharyngitis. The patient was sent home. Today he comes back from the dialysis center because  of positive blood cultures of gram positive cocci in clusters and increased lethargy. For the past 2 weeks the patient says that he has been having a sore throat, getting worse every day to the point that on the 28th it was really bad with a fever of 102. Yesterday was having significant increase in difficulty swallowing. He has not had  any chills. He is having now some sputum and cough. For the past couple of days, he has been very lethargic, as per the wife. He has been feeling very short of breath as well. On my examination, the patient has crackles and rales in his lungs. There is no report for x-ray done today. The patient is admitted due to sepsis and bacteremia with gram-positive cocci.   Allergies:  Percocet 2.5/325: N/V/Diarrhea  Other -Explain in Comment Field: Other  Adhesive: Other  LabObservation:  06-Mar-15 10:01   OBSERVATION Reason for Test  Hepatic:  10-Mar-15 03:43   Bilirubin, Total 0.5  Alkaline Phosphatase  293 (45-117 NOTE: New Reference Range 09/02/13)  SGPT (ALT) 29  SGOT (AST) 29  Total Protein, Serum 7.6  Albumin, Serum  2.0  Routine Micro:  02-Mar-15 12:26   Micro Text Report BLOOD CULTURE   COMMENT                   NO GROWTH AEROBICALLY/ANAEROBICALLY IN 5 DAYS   ANTIBIOTIC                       Micro Text Report BLOOD CULTURE   COMMENT                   NO GROWTH AEROBICALLY/ANAEROBICALLY IN 5 DAYS   ANTIBIOTIC                       Culture Comment NO GROWTH AEROBICALLY/ANAEROBICALLY IN 5 DAYS  Result(s) reported on 17 Dec 2013 at 12:00PM.  Culture Comment NO GROWTH AEROBICALLY/ANAEROBICALLY IN 5 DAYS  Result(s) reported on 17 Dec 2013 at 12:00PM.    21:51   Micro Text Report URINE CULTURE   COMMENT                   NO GROWTH IN 36 HOURS   ANTIBIOTIC                       Specimen Source CLEAN CATCH  Culture Comment NO GROWTH IN 36 HOURS  Result(s) reported on 14 Dec 2013 at 11:33AM.  06-Mar-15 09:06   Organism Name Ryan Arnold.AUREUS  Organism Quantity LIGHT GROWTH  Clindamycin Sensitivity S  Oxacillin Sensitivity R  Ciprofloxacin Sensitivity R  Gentamicin Sensitivity S  Erythromycin Sensitivity S  Vancomycin Sensitivity S  Levofloxacin Sensitivity I  Linezolid Sensitivity S  Tigecycline Sensitivity S  Trimethoprim/Sulfamethoxazole Sensitivty R   Cefoxitin Scrn. POSITIVE  Micro Text Report MISC AER/ANAEROBIC CULT.   ORGANISM 1                LIGHT GROWTH METHICILLIN RESISTANT STAPH.AUREUS   COMMENT                   NO ANAEROBES ISOLATED IN 4 DAYS   GRAM STAIN                FEW WHITE BLOOD CELLS   GRAM STAIN                FEW REDBLOOD CELLS   GRAM STAIN                NO ORGANISMS SEEN   ANTIBIOTIC                    ORG#1     CIPROFLOXACIN                 R         CLINDAMYCIN                   S         ERYTHROMYCIN                  S         GENTAMICIN      S         LEVOFLOXACIN                  I         LINEZOLID                     S         OXACILLIN                     R         TIGECYCLINE                   S         VANCOMYCIN                    S         CEFOXITIN SCREEN POSITIVE  INDUCIBLE CLINDAMYCIN RESISTANNEGATIVE  TRIMETHOPRIM/SULFAMETHOXAZOLE R  Specimen Source L AV FIST ABSCESS  Organism 1 LIGHT GROWTH METHICILLIN RESISTANT STAPH.AUREUS  Culture Comment NO ANAEROBES ISOLATED IN 4 DAYS  Gram Stain 1 FEW WHITE BLOOD CELLS  Gram Stain 2 FEW RED BLOOD CELLS  Gram Stain 3 NO ORGANISMS SEEN  Cardiology:  06-Mar-15  10:01   Echo Doppler REASON FOR EXAM:     COMMENTS:     PROCEDURE: [name] - ECHO DOPPLER COMPLETE(TRANSTHOR)  - Dec 16 2013 10:01AM   RESULT: Echocardiogram Report  Patient Name:   Ryan Arnold Date of Exam: 12/16/2013 Medical Rec #:  [id number]             Custom1: Date of Birth:  17-Apr-1961          Height:       74.0 in Patient Age:    5 years           Weight:       289.0 lb Patient Gender: M                  BSA:          2.54 m??  Indications: Endocarditis Sonographer:    Ryan Arnold RDCS Referring Phys: Ryan Arnold, Ryan Arnold  Sonographer Comments: Suboptimal apical window.  Summary:  1. Left ventricular ejection fraction, by visual estimation, is 50 to  55%.  2. Moderate left ventricular hypertrophy.  3. Severely increased left ventricular posterior wall thickness. 2D  AND M-MODE MEASUREMENTS (normal ranges within parentheses): Left Ventricle:          Normal IVSd (2D):      1.36 cm (0.7-1.1) LVPWd (2D):     1.53 cm (0.7-1.1) Aorta/LA:                  Normal LVIDd (2D):     4.50 cm (3.4-5.7) Aortic Root (2D): 3.40 cm (2.4-3.7) LVIDs (2D):     3.37 cm           Left Atrium (2D): 4.00 cm (1.9-4.0) LV FS (2D):     25.1 %   (>25%) LV EF (2D):     49.8 %   (>50%)                                   Right Ventricle:                           RVd (2D):        0.09 cm LV DIASTOLIC FUNCTION: MV Peak E: 1.36 m/s E/e' Ratio: 12.30 MV Peak A: 1.11 m/s Decel Time: 162 msec E/A Ratio: 1.23 SPECTRAL DOPPLER ANALYSIS (where applicable): Mitral Valve: MV P1/2 Time: 46.98 msec MV Area, PHT: 4.68 cm?? Aortic Valve: AoV Max Vel: 1.60 m/s AoV Peak PG: 10.3 mmHg AoV Mean PG: LVOT Vmax: 1.05 m/s LVOT VTI:  LVOT Diameter: 2.10 cm AoV Area, Vmax: 2.27 cm?? AoV Area, VTI:  AoV Area, Vmn: Tricuspid Valve and PA/RV Systolic Pressure: TR Max Velocity: 2.12 m/s RA  Pressure: 5 mmHg RVSP/PASP: 23.0 mmHg Pulmonic Valve: PV Max Velocity: 1.38 m/s PV Max PG: 7.6 mmHg PV Mean PG:  PHYSICIAN INTERPRETATION: Left Ventricle: The left ventricular internal cavity size wasnormal. LV  posterior wall thickness was severely increased. Moderate left  ventricular hypertrophy. Left ventricular ejection fraction, by visual  estimation, is 50 to 55%. Spectral Doppler shows normal pattern of LV  diastolic filling. Right Ventricle: The right ventricular size is normal. Left Atrium: The left atrium is normal in size. Right Atrium: The right atrium is normal in size. Pericardium: There is no evidence of pericardial effusion. Mitral Valve: Trace mitral valve regurgitation is seen. Tricuspid Valve: Trivial tricuspid regurgitation is  visualized. The  tricuspid regurgitant velocity is 2.12 m/s, and with an assumed right  atrial pressure of 5 mmHg, the estimated right ventricular systolic  pressure  is normal at 23.0 mmHg. Aortic Valve: The aortic valve is normal. Aorta: The aortic root is normal in size and structure.  Roberts MD Electronically signed by [id number] Isaias Cowman MD Signature Date/Time: 12/16/2013/6:17:47 PM  *** Final ***  IMPRESSION: .  Verified BySheppard Coil . PARASCHOS, M.D., MD  Routine Chem:  10-Mar-15 03:43   Glucose, Serum  207  BUN  40  Creatinine (comp)  8.50  Sodium, Serum 138  Potassium, Serum 3.9  Chloride, Serum 99  CO2, Serum 29  Calcium (Total), Serum 8.9  Osmolality (calc) 291  eGFR (African American)  8  eGFR (Non-African American)  7 (eGFR values <58m/min/1.73 m2 may be an indication of chronic kidney disease (CKD). Calculated eGFR is useful in patients with stable renal function. The eGFR calculation will not be reliable in acutely ill patients when serum creatinine is changing rapidly. It is not useful in  patients on dialysis. The eGFR calculation may not be applicable to patients at the low and high extremes of body sizes, pregnant women, and vegetarians.)  Anion Gap 10  Routine Coag:  10-Mar-15 03:43   Prothrombin 13.6  INR 1.1 (INR reference interval applies to patients on anticoagulant therapy. A single INR therapeutic range for coumarins is not optimal for all indications; however, the suggested range for most indications is 2.0 - 3.0. Exceptions to the INR Reference Range may include: Prosthetic heart valves, acute myocardial infarction, prevention of myocardial infarction, and combinations of aspirin and anticoagulant. The need for a higher or lower target INR must be assessed individually. Reference: The Pharmacology and Management of the Vitamin K  antagonists: the seventh ACCP Conference on Antithrombotic and Thrombolytic Therapy. CFFMBW.4665Sept:126 (3suppl): [id number] A HCT value >55% may artifactually increase the PT.  In one study,  the increase was an average of 25%. Reference:  "Effect on  Routine and Special Coagulation Testing Values of Citrate Anticoagulant Adjustment in Patients with High HCT Values." American Journal of Clinical Pathology 2006;126:400-405.)  Routine Hem:  10-Mar-15 03:43   WBC (CBC)  13.3  RBC (CBC)  3.47  Hemoglobin (CBC)  9.5  Hematocrit (CBC)  30.8  Platelet Count (CBC)  589  MCV 89  MCH 27.5  MCHC  31.0  RDW  17.0  Neutrophil % 80.2  Lymphocyte % 10.7  Monocyte % 6.9  Eosinophil % 1.5  Basophil % 0.7  Neutrophil #  10.7  Lymphocyte # 1.4  Monocyte # 0.9  Eosinophil # 0.2  Basophil # 0.1 (Result(s) reported on 20 Dec 2013 at 04:41AM.)   PERTINENT RADIOLOGY STUDIES: XRay:    01-Mar-15 08:36, Soft Tissue Neck  Soft Tissue Neck   REASON FOR EXAM:    odynophagia, fever  COMMENTS:       PROCEDURE: DXR - DXR SOFT TISSUE NECK  - Dec 11 2013  8:36AM     CLINICAL DATA:  Odynophagia, fever    EXAM:  NECK SOFT TISSUES - 1+ VIEW    COMPARISON:  None.    FINDINGS:  There is moderate prevertebral/retropharyngeal soft tissue swelling.  No retropharyngeal gas is evident. Aryepiglottic folds unremarkable.  There is mild leftward tracheal deviation and tapered narrowing just  above the thoracic inlet. Regional bones unremarkable.     IMPRESSION:  1. Moderate retropharyngeal soft tissue swelling. Consider CT neck  with contrast  if abscess is a clinical concern.      Electronically Signed    By: Arne Cleveland M.D.    On: 12/11/2013 08:40         Verified By: Kandis Cocking, M.D.,  [place]:    05-Mar-15 16:13, US Soft Tissue -Other Than Head or Neck  US Soft Tissue -Other Than Head or Neck   REASON FOR EXAM:    infected left upper extremity AV fistula, please   evaluate for underlying abscess  COMMENTS:       PROCEDURE: [place]  - US SOFT TISSUE, NOT NECK /  HEAD  - Dec 15 2013  4:13PM     CLINICAL DATA:  Clinical infection and edema at the level of a left  antecubital fossa brachiocephalic AV fistula.    EXAM:  ULTRASOUND LEFT  UPPER EXTREMITY LIMITED    TECHNIQUE:  Ultrasound examination of the upper extremity soft tissues was  performed in the area of clinical concern.  COMPARISON:  None    FINDINGS:  Soft tissue ultrasound evaluation at the level of the left  antecubital fossa shows a patent brachiocephalic AV fistula. No  abscess, pseudoaneurysm or mass is identified.     IMPRESSION:  No evidence of focal abscess at the level of the left antecubital  fossa and proximal brachiocephalic AV fistula.      Electronically Signed    By: Aletta Edouard M.D.    On: 12/15/2013 16:56     Verified By: Azzie Roup, M.D.,  [place]:    01-Mar-15 08:36, Soft Tissue Neck  PACS Image     01-Mar-15 09:54, CT Neck With Contrast  PACS Image     05-Mar-15 19:39, CT Neck With Contrast  PACS Image     05-Mar-15 19:39, CT Thoracic Spine Without Contrast  PACS Image     06-Mar-15 10:07, CT Chest Without Contrast  PACS Image     06-Mar-15 20:18, CT Abdomen and Pelvis With Contrast  PACS Image   CT:    01-Mar-15 09:54, CT Neck With Contrast  CT Neck With Contrast   REASON FOR EXAM:    fever, odynophagia, abnormal soft tissue neck X-ray  COMMENTS:       PROCEDURE: CT  - CT NECK WITH CONTRAST  - Dec 11 2013  9:54AM     CLINICAL DATA:  Recent diagnosis of bronchitis. Difficulty  swallowing and throat pain.    EXAM:  CT NECK WITH CONTRAST    TECHNIQUE:  Multidetector CT imaging of the neck was performed using the  standard protocol following the bolus administration of intravenous  contrast.  CONTRAST:  75 cc Isovue 300    COMPARISON:  Plain film of earlier today    FINDINGS:  Limited intracranial imaging is within normal limits. Normal orbits  and globes.    Normal nasopharynx. Mild soft tissue fullness of the palatine  tonsils bilaterally on image 70/series 2. This may represent  tonsillitis. There is ill-defined fluid beginning in within the  prevertebral space at the level of the low oral  pharynx. This  continues to just below of the level of the a piriform sinus cysts.  From approximately the C3 to C6-7 level. No evidence of drainable  abscess. Mild eccentric right, with mild displacement of the airway  to the left. Example image 97/series 2. The airway is patent. No  extension of edema into the mediastinum.    The thyroid gland, submandibular glands, and parotid glands enhance  symmetrically. Right maxillary sinus mucous retention cyst or polyp.  All vascular structures enhance normally.    No acute osseous abnormality.  Lower cervical spondylosis.     IMPRESSION:  Prevertebral ill-defined fluid, without well-defined fluid  collection to suggest abscess. This is most consistent with  pharyngitis.    Symmetric palatine tonsil soft tissue fullness, possibly related to  tonsillitis.      Electronically Signed    By: Abigail Miyamoto M.D.    On: 12/11/2013 10:20         Verified By: Areta Haber, M.D.,    05-Mar-15 19:39, CT Neck With Contrast  CT Neck With Contrast   REASON FOR EXAM:    odynophasia- compare- pharyngeal abscess??  COMMENTS:       PROCEDURE: CT  - CT NECK WITH CONTRAST  - Dec 15 2013  7:39PM     CLINICAL DATA:  Odynophagia.  Question pharyngeal abscess.    EXAM:  CT NECK WITH CONTRAST    TECHNIQUE:  Multidetector CT imaging of the neck was performed using the  standard protocol following the bolus administration of intravenous  contrast.  CONTRAST:  75 mL Isovue 370    COMPARISON:  CT neck with contrast 12/11/2013.    FINDINGS:  Extensive prevertebral fluid/edema is again noted. Edematous changes  extend anteriorly E asymmetrically on the right surrounding the  carotid space. Asymmetric hypoattenuation of the right lobe of the  thyroid is again seen as well.    Sub cm level 2 lymph nodes bilaterally are stable.    The fluid is limited to the C6-7 level without extension into the  danger space. Degenerative changes of the thoracic  spine are stable.  Edematous changes are present through the hypopharynx without a  discrete mass. The vocalcords are midline and symmetric. A right IJ  catheter is in place. There is no pneumothorax.    A cavitary nodule is present in the right upper lobe, measuring 17 x  14 mm. Patchy ground-glass attenuation is present bilaterally.     IMPRESSION:  1. Progressive prevertebral fluid without a definite abscess. A  fluid is is still limited at the C6-7 level without extension into  the danger space.  2. Stable appearance of reactive size level 2 lymph nodes.  3. Interval placement of right IJ catheter without pneumothorax.  4. Cavitary nodule in the right upper lobe is concerning for  atypical infection.  Electronically Signed    By: Lawrence Santiago M.D.    On: 12/15/2013 20:06         Verified By: Resa Miner. MATTERN, M.D.,    05-Mar-15 19:39, CT Thoracic Spine Without Contrast  CT Thoracic Spine Without Contrast   REASON FOR EXAM:    pain  COMMENTS:       PROCEDURE: CT  - CT THORACIC SPINE WO  - Dec 15 2013  7:39PM     CLINICAL DATA:  Upper back pain.    EXAM:  CT THORACIC SPINE WITHOUT CONTRAST    TECHNIQUE:  Multidetector CT imaging of the thoracic spine wasperformed without  intravenous contrast administration. Multiplanar CT image  reconstructions were also generated.  COMPARISON:  None available for comparison at time of study  interpretation.    FINDINGS:  Very mild wedging of vertebral body T6 suggest congenital variant,  thoracic vertebral bodies are intact and aligned and maintenance of  thoracic kyphosis. Mild T3-4 degenerative disc disease,  intervertebral disc heights generally preserved. No destructive bony  lesions. Right internal jugular central venous catheter in place.  Paraspinal soft tissues are nonsuspicious. Status post  cholecystectomy. Mild apparent pulmonary edema with patchy perihilar  and lung base ground-glass opacities, apparent  cavitating nodule in  right lower lobe. Mild cardiomegaly partially imaged. Large body  habitus. Inflammatory changes of the right neck.  No osseous canal stenosis or neural foraminal narrowing at any  level.     IMPRESSION:  No acute thoracic spine fracture nor malalignment.    Right neck inflammatory changes with internal jugular venous  catheter in place. In addition, apparent pulmonary edema with patchy  perihilar ground-glass opacities, partially imaged with suspected  cavitating nodule in right lung base. Findings would be better  characterized on dedicated CT of the chest with contrast.      Electronically Signed    By: Elon Alas    On: 12/15/2013 19:47         Verified By: Ricky Ala, M.D.,    06-Mar-15 10:07, CT Chest Without Contrast  CT Chest Without Contrast   REASON FOR EXAM:    abnml ct t spine with cavitary lesion, bacteremia,   possibel septic emobil  COMMENTS:       PROCEDURE: CT  - CT CHEST WITHOUT CONTRAST  - Dec 16 2013 10:07AM     CLINICAL DATA:  Sepsis.  Infection possible septic emboli    EXAM:  CT CHEST WITHOUT CONTRAST    TECHNIQUE:  Multidetector CT imaging of the chest was performed following the  standard protocol without IV contrast.  COMPARISON:  12/15/2013    FINDINGS:  There is no pleural effusion identified. Patchy areas of  ground-glass attenuation are scattered throughout both lungs. Multi  focal pulmonary nodules are identified predominantly involving the  right lung. Dominant nodule is in the superior segment of the right  lower lobe and has central area of cavitation measuring 1.3 cm,  image 29/series 3. Previously 1.7 cm. Anterior right upper lobe  nodule measures 4 mm, image 26/series 3. Ground-glass attenuating  nodule within the lateral right lung apex measures 1.4 cm, image  18/series 3.    The heart size appears normal. There is no pericardial effusion.  Small mediastinal and hilar lymph nodes are  identified. No  adenopathy. The esophagus appears patulous and mildly dilated.    There is no axillary or supraclavicular adenopathy. There is a right  internal jugular dual-lumen catheter with tips in the right atrium.    Incidental imaging through the upper abdomen is on unremarkable.  There is no aggressive lytic or sclerotic bone lesions. The adrenal  glands both appear normal. Prior cholecystectomy.     IMPRESSION:  1. Bilateral, multi focal areas of ground-glass attenuation  compatible with pneumonitis.  2. Cavitary nodule in the left lower lobe right lower lobe is  nonspecific but is slightly decreased in size from previous exam  suggesting infectiousetiology.  3. There are several other small nodules which are nonspecific and  do not exhibit cavitary features.      Electronically Signed    By: Kerby Moors M.D.    On: 12/16/2013 10:33         Verified By: Angelita Ingles, M.D.,    06-Mar-15 20:18, CT Abdomen and Pelvis With Contrast  CT Abdomen and Pelvis With Contrast   REASON FOR EXAM:    (1) severe epigastric abd pain; (2) same  COMMENTS:       PROCEDURE: CT  - CT ABDOMEN / PELVIS  W  - Dec 16 2013  8:18PM     CLINICAL DATA:  Significant abdominal pain    EXAM:  CT ABDOMEN AND PELVIS WITH CONTRAST    TECHNIQUE:  Multidetector CT imaging of the abdomen and pelvis was performed  using the standard protocol following bolus administration of  intravenous contrast.  CONTRAST:  100 mL Isovue 370.    COMPARISON:  None.    FINDINGS:  Lung bases are free of acute infiltrate or sizable effusion.    The liver demonstrates diffuse decreased attenuation consistent with  fatty infiltration. The gallbladder is been surgically removed. The  spleen, adrenal glands and pancreas are all normal in their CT  appearance. The kidneys demonstrate a normal enhancement pattern. No  renal calculi or urinary tract obstructive changes are seen.    The bladder is partially  distended with opacified urine. No pelvic  mass lesion or sidewall abnormality is noted. The appendix is  unremarkable.     IMPRESSION:  No acute abnormality noted.      Electronically Signed    By: Inez Catalina M.D.    On: 12/16/2013 21:04         Verified By: Everlene Farrier, M.D.,   Pertinent Past History:  Pertinent Past History PAST MEDICAL HISTORY: 1.  Type 2 insulin-dependent diabetes.  2.  Diabetic retinopathy. 3.  Diabetic neuropathy.  4.  Glaucoma.  5.  Congestive heart failure, diastolic, compensated.  6.  Coronary artery disease.  7.  End-stage renal disease, on dialysis Monday, Wednesday, and Friday.  8.  Sleep apnea, on CPAP.  9.  GERD.  10.  Hyperlipidemia.  11.  Morbid obesity.  12.  Hypertension.  65.  Well controlled depression.   PAST SURGICAL HISTORY: 1.  Two AV fistulas. 2.  Right knee surgery.  3.  Cholecystectomy.  4.  Right shoulder surgery.   ALLERGIES: PERCOCET.   FAMILY HISTORY: Positive for diabetes and hypertension in his mother. No coronary artery disease. No cancer.   SOCIAL HISTORY: The patient has never smoked. He does not drink. He does not use drugs. He lives with his wife.   Hospital Course:  Hospital Course 1 sepsis this was presented admission as there was blood culture done on the first March the previous day this presentation which was positive with MRSA bacteria in blood he was admitted with IV vancomycin. Being on hemodialysis he was receiving vancomycin with each hemodialysis. His  AV fistula on left arm looked infected, sonogram left extremity was done which did not  not show any free fluid collection . repeated blood culture on the second March remained negative. Infectious disease consult was called in they suggested to continue vancomycin with each hemodialysis six-weeks starting from March 2.permacath was placed to continue hemodialysis during this period.   #2 pharyngitis and retropharyngeal fluid collection It was  seen by CAT scan of neck on last March patient was continued on vancomycin which can also take care of his pharyngitis. The patient did not had improvement in his severe dysphagia and odinophagia. repeated CAT scan on March 5 soft tissue neck- assured slightly increased fluid collection so ENT consult was called in they agreed laryngoscopy and upper part of pharynx but they did not see any localized bulging so suggested to have modified barium swallow which was done on ninth March and it showed a significant swelling and posterior esophagus which was a high risk for aspiration suggested to keep NPO.    Spoke to ENT,  Gi, Thorasic surgery and Intervention radiology- but may be possibly need of complicated surgery/ procedure-  contacted [facility]- and transfering for further management.  3.  ESRD;on HD  4.  DMI;unable to use insulin pmp,continue sliding scale insulin. NPO  5.  Hypertension.  amlodipin,metoprolol and lasix - changed to IV.  6.  Hyperlipidemia. hold atorvastatin.   7.  Anxiety and depression. all meds on hold due to NPO, Ativan PRN, but not needing much.  8.  Deep vein thrombosis prophylaxis with subcutaneous heparin.   9.  Sleep apnea. Continue CPAP machine.   Condition on Discharge Stable   Code Status:  Code Status Full Code   DISCHARGE INSTRUCTIONS HOME MEDS:  Medication Reconciliation: Patient's Home Medications at Discharge:     Medication Instructions  ondansetron  4 milligram(s) injectable every 4 hours, As needed, Nausea/Vomiting   furosemide  40 milligram(s) injectable every 12 hours   pantoprazole  40 milligram(s) injectable once a day   hydromorphone  0.5 milligram(s) injectable every 4 hours, As needed, severe pain   insulin aspart 100 units/ml subcutaneous solution  2 unit(s) subcutaneous 4 times a day- after checking blood sugar.   vancomycin  1000 milligram(s) intravenous once- after each hemodialysis.   hydralazine  10 milligram(s) injectable every 6 hours,  As needed, htn    PRESCRIPTIONS: ELECTRONICALLY SUBMITTED  STOP TAKING THE FOLLOWING MEDICATION(S):    atorvastatin 20 mg oral tablet: 1 tab(s) orally once a day (at night) rena-vite oral tablet: 1 tab(s) orally once a day humalog 100 units/ml subcutaneous solution: Use with insulin pump cymbalta 30 mg oral delayed release capsule: 3 cap(s) orally once a day ferosul 325 mg oral tablet:  orally once a day viibryd 40 mg oral tablet: 1 tab(s) orally once a day aspirin 81 mg oral tablet: 1 tab(s) orally once a day humalog kwikpen 100 units/ml subcutaneous solution:  subcutaneous  as directed ibu 800 mg oral tablet: 1 tab(s) orally 3 times a day, As Needed - for Fever - for Headache  - for Pain  metoprolol 50 mg oral tablet: 1 tab(s) orally 2 times a day amoxicillin-clavulanate 875 mg-125 mg oral tablet: 1 tab(s) orally every 12 hours acetaminophen-hydrocodone 325 mg-5 mg oral tablet: 1 tab(s) orally every 6 hours, As Needed for pain that does not respond to ibuprofen allopurinol 100 mg oral tablet: 1 tab(s) orally 2 times a day. Comments:  gabapentin: 100 milligram(s) orally once a day, on dialysis days take after dialysis.. Comments: unknown strength cymbalta 60 mg oral delayed release capsule: 1 cap(s) orally once a day abilify 2 mg oral tablet: 1  orally once a day (at bedtime)  Physician's Instructions:  Diet NPO- except ice chips   Activity Limitations As tolerated   Return to Work after follow up visit with MD   Time frame for Follow Up Appointment 1-2 days   Other Comments Being transferred to [facility] for further surgical management, need to resume all oral meds , once he can tolerate.   Electronic Signatures: Ryan Arnold (MD)  (Signed 10-Mar-15 15:52)  Authored: ADMISSION DATE AND DIAGNOSIS, CHIEF COMPLAINT/HPI, Allergies, PERTINENT LABS, PERTINENT RADIOLOGY STUDIES, PERTINENT PAST HISTORY, HOSPITAL COURSE, DISCHARGE INSTRUCTIONS HOME MEDS, PATIENT  INSTRUCTIONS   Last Updated: 10-Mar-15 15:52 by Ryan Arnold (MD)

## 2015-02-04 LAB — BASIC METABOLIC PANEL
Anion Gap: 8 (ref 7–16)
BUN: 46 mg/dL — AB
Calcium, Total: 8.2 mg/dL — ABNORMAL LOW
Chloride: 94 mmol/L — ABNORMAL LOW
Co2: 31 mmol/L
Creatinine: 9.63 mg/dL — ABNORMAL HIGH
EGFR (African American): 6 — ABNORMAL LOW
EGFR (Non-African Amer.): 6 — ABNORMAL LOW
GLUCOSE: 89 mg/dL
Potassium: 4.1 mmol/L
SODIUM: 133 mmol/L — AB

## 2015-02-04 NOTE — Op Note (Signed)
PATIENT NAME:  Ryan Arnold, Ryan Arnold MR#:  [id number] DATE OF BIRTH:  07-25-1961  DATE OF PROCEDURE:  12/02/2011  PREOPERATIVE DIAGNOSIS: Stage IV renal insufficiency.   POSTOPERATIVE DIAGNOSIS: Stage IV renal insufficiency.  PROCEDURE PERFORMED:  Creation of a left brachiocephalic fistula.   SURGEON: Renford DillsGregory G. Schnier, MD   ANESTHESIA: General by LMA.   FLUIDS: Per anesthesia record.   ESTIMATED BLOOD LOSS: Minimal.   SPECIMEN: None.   INDICATIONS: [name] is a 54 year old gentleman who presents to the office with worsening renal insufficiency and, therefore, is undergoing creation of a fistula in preparation for dialysis. The risks and benefits were reviewed. All questions are answered. The patient agrees to proceed.   DESCRIPTION OF PROCEDURE: The patient is taken to the Operating Room and placed in the supine position. After adequate general anesthesia is induced and appropriate invasive monitors are placed, he is positioned supine with his left arm extended palm upward. The left arm is prepped and draped in sterile fashion. A curvilinear incision is created overlying the antecubital fossa and extending along the course of the brachial impulse. The dissection is carried through the soft tissues to expose the antecubital crossing vein which appears to be of nice size. Preoperative vein mapping demonstrated a cephalic vein that was nearly 4 mm in diameter. The dissection was then turned to the brachial artery which was exposed and looped proximally and distally with Silastic vessel loops. The vein itself was then readdressed, dissecting it again circumferentially proximally and distally, ligating side branches with 3-0 silk ties or 6-0 Prolene. Once adequate length had been achieved, the vein was transected and ligated with a silk tie and then dilated with [facility] dilators. The vein did dilate to 4 mm quite easily. It is flushed with heparinized saline. Maintaining proper orientation,  an end vein-to-side brachial artery anastomosis was fashioned with running 6-0 Prolene. Flushing maneuvers were performed and flow was established through the fistula. Excellent thrill was noted. Good distal pulse was maintained.   The patient tolerated the procedure well, and there were no immediate complications. Sponge and needle counts were correct. He was taken to the recovery area in excellent condition.   ____________________________ Renford DillsGregory G. Schnier, MD ggs:cbb D: 12/02/2011 13:03:33 ET T: 12/02/2011 13:22:29 ET JOB#: [id number]  cc: Renford DillsGregory G. Schnier, MD, <Dictator> Mosetta PigeonHarmeet Singh, MD Teena Iraniavid M. Terance HartBronstein, MD Renford DillsGREGORY G SCHNIER MD ELECTRONICALLY SIGNED 12/08/2011 10:33

## 2015-02-05 LAB — RENAL FUNCTION PANEL
Albumin: 2.6 g/dL — ABNORMAL LOW
Anion Gap: 14 (ref 7–16)
BUN: 59 mg/dL — ABNORMAL HIGH
CREATININE: 10.85 mg/dL — AB
Calcium, Total: 8.4 mg/dL — ABNORMAL LOW
Chloride: 91 mmol/L — ABNORMAL LOW
Co2: 28 mmol/L
EGFR (African American): 6 — ABNORMAL LOW
EGFR (Non-African Amer.): 5 — ABNORMAL LOW
Glucose: 173 mg/dL — ABNORMAL HIGH
POTASSIUM: 4.7 mmol/L
Phosphorus: 6 mg/dL — ABNORMAL HIGH
SODIUM: 133 mmol/L — AB

## 2015-02-07 DIAGNOSIS — J8489 Other specified interstitial pulmonary diseases: Secondary | ICD-10-CM | POA: Diagnosis not present

## 2015-02-07 DIAGNOSIS — J984 Other disorders of lung: Secondary | ICD-10-CM | POA: Diagnosis not present

## 2015-02-08 ENCOUNTER — Emergency Department
Admit: 2015-02-08 | Disposition: A | Discharge status: Home / Self Care | Payer: Self-pay | Admitting: Emergency Medicine

## 2015-02-11 NOTE — Discharge Summary (Signed)
PATIENT NAME:  Ryan Arnold, Ryan Arnold MR#:  [id number] DATE OF BIRTH:  [birth year]  DATE OF ADMISSION:  12/12/2014 DATE OF DISCHARGE:  12/18/2014  PRIMARY CARE PHYSICIAN:  Is at [facility].  NEPHROLOGIST: Laverda Sorenson, MD     DIALYSIS CENTER: [facility], [address]   FINAL DIAGNOSES: 1. Bilateral pneumonia.  2. End-stage renal disease on hemodialysis.  3. Hypertension.  4. Hyperlipidemia.  5. Sleep apnea.   MEDICATIONS ON DISCHARGE: Include: Aspirin 81 mg daily, atorvastatin 20 mg at bedtime, allopurinol 100 mg twice a day, Rena-Vite 1 tablet daily, metoprolol 50 mg twice a day, Abilify 2 mg at bedtime, gabapentin 100 mg daily, Sensipar 60 mg daily, amlodipine 5 mg 2 tablets once a day, BuSpar 5 mg twice a day, Cymbalta 60 mg 2 tablets daily, clonazepam 0.5 mg twice a day as needed, Emla 2.5% apply to affected area once prior to dialysis, magnesium hydroxide 400 mg daily, sevelamer carbonate 800 mg twice a day as needed with snacks, Protonix 40 mg twice a day, NovoLog FlexPen sliding scale, sevelamer  800 mg 3 tablets 3 times a day with meals, glargine insulin decreased to 20 units subcutaneous injection at bedtime, lisinopril 20 mg daily, Augmentin 500 mg at bedtime for 4 days, Levaquin 500 mg every 48 hours for 4 days, so 2 more pills. Lasix 80 mg on nondialysis days, orally.   DIET: Renal diet, regular consistency.   ACTIVITY: As tolerated.   FOLLOWUP CARE: Follow up dialysis, [address], Monday, Wednesday, Friday. Dr. Dema Severin in 1 month. Your medical doctor in 1-2 weeks.     HOSPITAL COURSE: The patient was admitted 12/12/2014, discharged 12/18/2014. He came in with a fever and cough, was admitted with healthcare associated pneumonia, was started on vancomycin, Zosyn, and Levaquin.   LABORATORY AND RADIOLOGICAL DATA DURING HOSPITAL COURSE: Included BNP 10,366, troponin borderline at 0.03, glucose 319, BUN 35, creatinine 7.94. Sodium 137, potassium 4.7, chloride 99, CO2 of 27, calcium 7.9.  Liver function tests: Alkaline phosphatase 227, ALT 55, AST 34, total protein 6.6, albumin 2.7. White blood cell count 11.6, hemoglobin 9.4, hematocrit 29.6, platelet count of 285,000. Blood culture negative. EKG: Sinus tachycardia, septal infarct. Chest x-ray PA and lateral: Shows extensive infiltrate on the right mid and lower lung zones, most likely representing pneumonia. Influenza: Negative Legionella antigen: Negative. ABG showed a pH of 7.41, pCO2 of 43, pO2 of 56. That was on 44% oxygen. Oxygen saturation of 91.2. Parathyroid hormone: Intact, 799. CT scan of the chest with contrast: Showed patchy airspace consolidation and ground glass involving all lobes of both lungs, likely due to pneumonia. Pulmonary hemorrhage is another consideration. Congestive heart failure is less likely. Echocardiogram showed ejection fraction of 50% to 55%, mild concentric left ventricular hypertrophy. Viral culture: Result pending. Sputum culture: Normal flora. HIV test: Negative. Pneumocystis silver stain: Negative. Bronchial washing: Normal flora. Fungus culture: Negative. Acid fast culture: Pending. Acid fast smear: Negative. Another bronchial washing: Normal flora, no yeast. C4: Normal range at 41. C3: One point higher than the normal range at 168. Antiglomerular basement membrane: Negative at 4 units. ANCA panel: Still pending at this time. ANA: Negative. Creatinine upon discharge 11.62, potassium 4.9, white blood cell count 9.3, hemoglobin 8.4.   HOSPITAL COURSE PER PROBLEM LIST:  1. For the patient's bilateral pneumonia: The patient also had acute respiratory failure requiring 6 liters of oxygen. The patient is now breathing comfortably on room air, saturating 97%. The patient was on triple antibiotic therapy during the entire hospital  course. Last dose of vancomycin on the day of discharge. The patient will be on oral Augmentin nightly once a day for 4 more days and oral Levaquin dosed every 48 hours for a total of 4  days, so another 2 pills. The patient will follow up with Dr. Dema SeverinMungal in 1 month for repeat imaging. Depending on what the repeat imaging looks like, Dr. Dema SeverinMungal may refer back to Dr. [name], who saw the patient in consultation for a lung biopsy. If the patient gets readmitted within this time frame with another pneumonia, will end up needing a lung biopsy. The patient's condition had improved greatly since coming into the hospital. The patient had a bronchoscopy with negative cultures during the hospital course.  2. End-stage renal disease on hemodialysis: Kept on usual dialysis schedule. Case discussed with Dr. Wynelle LinkKolluru to pull off more fluid with dialysis to keep on the drier side with a lower dry weight. I will give Lasix on nondialysis days.  3. Hypertension: Blood pressure is stable. Follow up as outpatient. I did write scripts for Lasix and lisinopril and kept his usual medications.  4. Hyperlipidemia: On atorvastatin.  5. Sleep apnea: Wear CPAP at night.  6. Diabetes: The patient did have hypoglycemic episodes here. I cut back on his glargine insulin to 20 units at bedtime and sliding scale only.   TIME SPENT ON DISCHARGE: 35 minutes.     ____________________________ Herschell Dimesichard J. Renae GlossWieting, MD rjw:mw D: 12/18/2014 15:56:54 ET T: 12/18/2014 18:40:48 ET JOB#: [id number]  cc: Herschell Dimesichard J. Renae GlossWieting, MD, <Dictator> Laverda SorensonSarath C. Kolluru, MD [facility] Duncan [name] Mungal, MD  Salley ScarletICHARD J Kamarius Buckbee MD ELECTRONICALLY SIGNED 12/20/2014 10:53

## 2015-02-11 NOTE — Consult Note (Signed)
Brief Consult Note: Diagnosis: hyperglycemia, leg swelling, essential HTn, hyperlipidemia, gout, CHF, s/p R thoracotomy 4.19.16.   Patient was seen by consultant.   Consult note dictated.   Recommend further assessment or treatment.   Orders entered.   Comments: 1. DM with hyperglycemia, resume lantus, novolog prior to  meals at lower doses than home as PO intake is still low 2. leg swelling,. lasix po, HD per schedulle 3. ESRD,  HD per nephrology 4. essential HTn, lasix, continue home meds 5. R thoracotomy for ILD, contineu O2 , pain meds prn Thanks for consult, well follow.  Electronic Signatures: Katharina CaperVaickute, Glenyce Randle (MD)  (Signed 21-Apr-16 13:22)  Authored: Brief Consult Note   Last Updated: 21-Apr-16 13:22 by Katharina CaperVaickute, Elfriede Bonini (MD)

## 2015-02-11 NOTE — H&P (Signed)
PATIENT NAME:  Ryan Arnold, [name] MR#:  [id number] DATE OF BIRTH:  05/13/1961  DATE OF ADMISSION:  01/07/2015  ADMITTING PHYSICIAN:  Lucrezia EuropeAllison Webster, M.D.   PRIMARY CARE DOCTOR:  Nonlocal.  ADMISSION DIAGNOSIS: Hypoxia, pneumonia, and atypical chest pain.   HISTORY OF PRESENT ILLNESS: This is a 54 year old African American male who presents to the Emergency Department complaining of shortness of breath. The patient states that he started feeling bad after his dialysis treatment 3 days ago. He states that he was unable to complete dialysis that day due to feeling badly.  He recalls leaving the treatment center 1 pound above his dry weight which is 134 kg.  He states that his chest began that day and has been intermittent, sharp, and brief on multiple occasions since then. He states that his left upper chest is actually sore to touch. He has experienced some nausea and had diarrhea for the last 3 days as well. In the Emergency Department, the patient was found to have pneumonia and required supplemental oxygen with BiPAP which prompted the Emergency Department to call for admission.   REVIEW OF SYSTEMS: CONSTITUTIONAL: The patient denies fever or weakness.  EYES: Denies blurred vision or inflammation.  EARS, NOSE AND THROAT: Denies tinnitus or sore throat.  RESPIRATORY: Admits to some cough and shortness of breath.  CARDIOVASCULAR: Admits to chest pain, but denies palpitations, orthopnea, or paroxysmal nocturnal dyspnea.  GASTROINTESTINAL: Admits to nausea and diarrhea, but denies vomiting or abdominal pain.  GENITOURINARY: Denies dysuria, increased frequency, or hesitancy of urination.  ENDOCRINE: Denies polyuria, polydipsia. HEMATOLOGIC AND LYMPHATIC:  Denies easy bruising or bleeding.  INTEGUMENTARY: Denies rashes or lesions.  MUSCULOSKELETAL: Denies arthralgias or myalgias.  NEUROLOGIC: Denies numbness in his extremities or dysarthria.  PSYCHIATRIC: Denies depression or suicidal ideation.    PAST MEDICAL HISTORY: End-stage renal disease, diabetes type 2, hypertension, hyperlipidemia, and depression.   PAST SURGICAL HISTORY: Fistula placement in the left arm, bilateral cataract removal, lens placements in his eyes bilaterally, laser retina repair, right patellar tendon repair, and left shoulder repair.   SOCIAL HISTORY: The patient lives with his wife. He does not smoke, drink, or do any drugs. He receives dialysis on Monday, Wednesday, and Friday.   FAMILY HISTORY: Significant for diabetes type 2 and hypertension.   MEDICATIONS:  1.  Albuterol 90 mcg/inhalations 2 puffs 4 times a day as needed for coughing, wheezing, or shortness of breath.  2.  Allopurinol 100 mg 2 tablets p.o. daily.  3.  Amlodipine 5 mg 2 tablets p.o. daily.  4.  Amoxicillin clavulanic acid 500 mg 1 tablet p.o. at bedtime. This is an old prescription and is now completed.  5.  Aspirin 81 mg 1 tablet p.o. daily.  6.  Atorvastatin 20 mg 1 tablet p.o. daily.  7.  Bupropion 75 mg 1 tablet p.o. b.i.d.  8.  Clonazepam 0.5 mg 1 tablet p.o. before bed as needed.  9.  Duloxetine 60 mg delayed release capsule 2 capsules p.o. daily.  10.  EMLA cream applied topically to affected area prior to dialysis.  11.  Epoetin alfa 4,000 units/mL 0.55 mL subcutaneously on dialysis days.  12.  Gabapentin 100 mg 1 capsule p.o. daily.  13.  Insulin glargine 74 units subcutaneously at bedtime.  14.  Ketoconazole topical ointment 2% apply to affected area 2 times a day as needed.  15.  Lasix 40 mg 1 tablet p.o. daily.  16.  Lisinopril 20 mg 1 tablet p.o. daily.  17.  Magnesium  oxide 400 mg 1 tablet p.o. daily.  18.  Metoprolol tartrate 25 mg 1 tablet p.o. b.i.d.  19.  Nitrostat 0.4 mg sublingual tablets 1 tablet sublingually every 5 minutes as needed for chest pain.  20.  NovoLog FlexPen per sliding scale 4 times a day before meals and at bedtime.  21.  NovoLog FlexPen 25 units subcutaneously 3 times a day before meals.  22.   Protonix 40 mg delayed release 1 tablet p.o. b.i.d.  23.  Rena-Vite B complex with vitamin C and folate acid 1 tablet p.o. daily.  24.  Senokot 50 mg/8.6 mg 2 tablets p.o. b.i.d.  25.  Sensipar 30 mg 1 tablet p.o. daily.  26.  Levemir 800 mg 1 tablet p.o. b.i.d. as needed with snacks.  26.  Levemir 800 mg 2 tablets p.o. t.i.d. with meals.   ALLERGIES: PERCOCET, ADHESIVE.  PERTINENT LABORATORY RESULTS AND RADIOGRAPHIC FINDINGS:  Serum glucose is 555, BUN 58, creatinine is 8.02, serum sodium 132, potassium 4.9, chloride 96, bicarbonate is 27, calcium is 8.2. Troponin is negative. White blood cell count is 9.6, hemoglobin is 10.3, hematocrit 32.2, platelet count is 381,000, MCV is 88. Chest x-ray shows worsening bibasilar airspace disease concerning for worsening pneumonia. There is mild vascular congestion and enlargement of the cardiac silhouette likely accentuated by technique.   PHYSICAL EXAMINATION:  VITAL SIGNS: Temperature is 98, pulse 80, respirations 24, blood pressure is 160/75, pulse oximetry is 99% on 35% FiO2 via BiPAP.  GENERAL: The patient is alert and oriented x 3 in no apparent distress. He is not using accessory muscles for work of breathing at this time.  HEENT: Normocephalic, atraumatic. Pupils equal, round, and reactive to light and accommodation. Extraocular movements are intact. Mucous membranes are moist.  NECK: Trachea is midline. No adenopathy. Thyroid is nonpalpable and nontender.  CHEST: Symmetric and atraumatic.  CARDIOVASCULAR: Regular rate and rhythm. Normal S1, S2. No rubs, clicks, or murmurs appreciated.  LUNGS: Clear to auscultation bilaterally.  The patient is wearing a BiPAP.  ABDOMEN: Positive bowel sounds. Soft, nontender, nondistended. No hepatosplenomegaly.  GENITOURINARY: Deferred.  MUSCULOSKELETAL: The patient moves all 4 extremities equally. There is 5/5 strength in upper lower extremities bilaterally. I have not observed his gait.  SKIN: Warm and dry. No  rashes or lesions.  EXTREMITIES: No clubbing, cyanosis, or edema.  NEUROLOGIC: Cranial nerves II-XII are grossly intact.  PSYCHIATRIC: Mood is normal. Affect is congruent. The patient has excellent judgment and insight into his medical condition.   ASSESSMENT AND PLAN: This is a 54 year old male admitted for hypoxia, pneumonia, and atypical chest pain. 1.  Hypoxia is likely secondary to fluid overload and pneumonia. The patient is breathing more comfortably on BiPAP requiring pressures of 10/5. He may need dialysis to completely resolve his respiratory distress.  2.  Healthcare-associated pneumonia. The patient was started on Levaquin and vancomycin in the Emergency Department. We will continue these antibiotics before switching to oral medications.  3.  Chest pain. It is atypical and reproducible.  It unlikely to be cardiac in origin. We will trend the patient's biomarkers expecting a slight elevation due to his chronic kidney disease and poor renal clearance. Cardiology evaluation at the discretion of the primary care team.  4.  End-stage renal disease. Will consult nephrology for possible dialysis.  5.  Renal osteodystrophy. Continue Sensipar and cinacalet.  6.  Diabetes type 2. The patient's glucose is currently more than 500 despite receiving 10 units of IV insulin in the Emergency Department. The  patient admits that he has forgotten to take his Lantus tonight. Will give him 15 units of Lantus subcutaneously for now and also give 20 units of insulin Aspart IV for now. Will start sliding scale insulin in addition to prandial insulin and basal insulin.  7.  Hypertension. Continue lisinopril.  8.  Hyperlipidemia. Continue statin therapy.  9.  Depression. Continue bupropion.  10.  Obesity. The patient's BMI is 37.6. I have encouraged a healthy diet and exercise.  11.  Deep vein thrombosis prophylaxis. Heparin.  12.  Gastrointestinal prophylaxis. None, as the patient is not critically ill.   CODE  STATUS: The patient is a full code.    Time spent on admission orders and patient care is approximately 40 minutes.   ____________________________ Kelton Pillar. Sheryle Hail, MD msd:sp D: 01/07/2015 08:25:18 ET T: 01/07/2015 09:01:41 ET JOB#: [id number]  cc: Kelton Pillar. Sheryle Hail, MD, <Dictator> Kelton Pillar Julicia Krieger MD ELECTRONICALLY SIGNED 01/08/2015 0:29

## 2015-02-11 NOTE — Consult Note (Signed)
PATIENT NAME:  Ryan Arnold, Mandell W MR#:  [id number] DATE OF BIRTH:  22-Feb-1961  DATE OF CONSULTATION:  12/14/2014  REFERRING PHYSICIAN:   CONSULTING PHYSICIAN:  Jerman Tinnon E. [name], MD  REQUESTING PHYSICIAN:  Stephanie AcreVishal Mungal, MD.  REASON FOR CONSULTATION: Bilateral pulmonary infiltrates, possible open lung biopsy.   I have personally seen and examined [name]. I have reviewed his films independently. I have discussed his care with Dr. Dema SeverinMungal.    HISTORY OF PRESENT ILLNESS: [name] is a 54 year old African American gentleman with end-stage renal disease on hemodialysis. He also has a history of diabetes, hypertension, hyperlipidemia, morbid obesity and congestive heart failure. The patient states that [facility] pulmonary infiltrates and a left-sided pneumonia. He was treated with antibiotics and discharged home, but over the course of the next several days he became more and more short of breath, had subjective fevers and was admitted through our Emergency Room with a diagnosis of pneumonia. Another chest x-ray and CT scan confirmed the presence of bilateral pulmonary infiltrates and he underwent bronchoscopy with bronchoalveolar lavage. The results of that are currently pending. According to the patient, he has been markedly short of breath for the last several weeks. He states that this has been going on for quite a while. Does not have any hemoptysis. He has not taken his temperature, but he has noted subjective fevers for the last several weeks.   PAST MEDICAL HISTORY: As outlined above. He has had multiple orthopedic surgeries on his knees and shoulders. He also has a history of cataracts and has undergone cataract surgery. He has a history of gallbladder surgery as well.   FAMILY HISTORY: Positive for diabetes and hypertension. There is no history of heart or lung disease.   SOCIAL HISTORY: He does not drink or smoke. He is a retired Nurse, mental healthpolice  policeman. He lives with his wife and children.   REVIEW OF SYSTEMS:  As per history of present illness and all other review of systems were asked and were negative.   PHYSICAL EXAMINATION:  GENERAL: Revealed a pleasant, obese gentleman in no distress. He was inspiring nasal cannula oxygen. He was able to speak in complete sentences.  LUNGS: Very distant and equal bilaterally. He had some coarse rhonchi.  HEART: Regular. There were no murmurs.  ABDOMEN: Obese, soft, nontender, nondistended. There were no palpable masses. There was no hepatosplenomegaly.  EXTREMITIES: Revealed 1+ pitting edema. He did have a functioning fistula in his left upper extremity.  NEUROLOGIC: Grossly unremarkable.   ASSESSMENT AND PLAN: I have independently reviewed the patient's CT scan. There is bilateral pulmonary infiltrates consistent with an interstitial lung process. He is currently being worked up for possible vasculitis. The results of the bronchoalveolar lavage are still pending. I did review with the patient in detail the indications and risks of thoracoscopy, possible thoracotomy with lung biopsy. He understands. We will await the results of all of his bronchoalveolar lavage and his vasculitis work-up.   Thank you very much for allowing me to participate in his care.     ____________________________ [name], MD teo:by D: 12/14/2014 17:12:19 ET T: 12/14/2014 20:35:19 ET JOB#: [id number]  cc: Marcial Pacasimothy E. [name], MD, <Dictator> Jasmine DecemberIMOTHY E Yaremi Stahlman MD ELECTRONICALLY SIGNED 12/15/2014 10:30

## 2015-02-11 NOTE — Discharge Summary (Signed)
PATIENT NAME:  Ryan Arnold, Ryan Arnold MR#:  [id number] DATE OF BIRTH:  Dec 08, 1960  DATE OF ADMISSION:  01/07/2015 DATE OF DISCHARGE:  01/11/2015  ADMITTING DIAGNOSES: Hypoxia, shortness of breath, atypical chest pain.   DISCHARGE DIAGNOSES: 1. Acute respiratory failure due to recurrent bilateral pneumonia, felt to be due to hospital acquired pneumonia, as well as acute on chronic diastolic congestive heart failure.  2. Acute on chronic diastolic congestive heart failure.  3. Healthcare associated pneumonia.  4. Chest pain, felt to be due to cough and pulmonary symptoms.  5. End-stage renal disease on hemodialysis.  6. Renal osteodystrophy.  7. Diabetes type 2, with labile blood glucose with hypoglycemia.  8. Hypertension.  9. Hyperlipidemia.  10. Depression.  11. Morbid obesity, weight loss recommended.  12. Status post fistula placement in the left arm.  13. Status post bilateral cataract removal.  14. Status post lens placement in his eyes bilaterally.  15. Status post laser retinal repair.  16. Status post right patellar tendon repair.  17. Status post left shoulder repair.   CONSULTANTS: Dr. Mady Haagensen, Dr. Thedore Mins.    PERTINENT LABORATORIES AND EVALUATIONS: Admitting glucose 555, BUN 58, creatinine 8.02, sodium 132, potassium 4.9, chloride 96, CO2 27. Troponin 0.03. TSH was slightly abnormal at 6.26. WBC 9.6, hemoglobin 10.3, platelet count was 381,000.   Blood cultures: No growth.   Immunoglobulin level is slightly decreased  CT per PE protocol, which showed no evidence of pulmonary embolus, patchy area of ground glass opacity in both lungs, predominantly upper lobes, right greater than left, felt to be due to multifocal pneumonia.   HOSPITAL COURSE: Please refer to the history and physical done by the admitting physician. The patient is a 54 year old, African American male, who presented to the ED with complaint of shortness of breath. He was recently hospitalized with pneumonia.  The patient initially had to be placed on BiPAP. He was seen by nephrology, and also underwent dialysis. After dialysis, his breathing improved. To ensure that his symptoms were just not due to acute diastolic congestive heart failure, he underwent a CT per PE protocol, which confirmed findings of bilateral upper lobe pneumonia. The patient was continued on broad-spectrum antibiotics, and now his breathing has improved and he is doing much better. He will need home oxygen therapy. At this time, he is stable for discharge.   DISCHARGE MEDICATIONS: Aspirin 81 mg 1 tablet p.o. daily, atorvastatin 20 at bedtime, Rena-Vite 1 tablet p.o. daily, gabapentin 100 mg 1 tablet p.o. daily, amlodipine 10 mg daily, duloxetine 60 two capsules daily, EMLA topical cream affected area prior to dialysis, magnesium hydroxide 400 daily, sevelamer 800 mg 1 tablet p.o. b.i.d. with snacks, Protonix 40 mg 1 tablet p.o. b.i.d., lisinopril 20 daily, allopurinol 200 daily, clonazepam 0.5 once in the evening as needed, Lantus 74 units at bedtime, Lasix 40 daily, metoprolol tartrate 25 mg 1 tablet p.o. b.i.d., Sensipar 30 daily, bupropion 75 one tablet p.o. b.i.d., Epogen with dialysis, ketoconazole topically to affected area b.i.d., Nitrostat 0.4 sublingual p.r.n., Senokot 2 tablets b.i.d., NovoLog sliding scale as previously,  2 tablets t.i.d. with meals, albuterol 2 puffs 4 times a day, levofloxacin 500 mg 1 tablet  p.o. q.48 h.   HOME OXYGEN: O2 at 2-liter nasal cannula.   DIET: Low fat, low cholesterol, renal, carbohydrate-controlled diet.   ACTIVITY: As tolerated.   FOLLOW-UP: With primary M.D. in 1 to 2 weeks, hemodialysis as doing previously. CPAP at bedtime, 2 liters of nasal cannula oxygen continuously.  TIME SPENT ON THIS DISCHARGE: 35 minutes.    ____________________________ Lacie ScottsShreyang H. Allena KatzPatel, MD shp:JT D: 01/12/2015 10:46:21 ET T: 01/12/2015 12:25:05 ET JOB#: [id number]  cc: Giliana Vantil H. Allena KatzPatel, MD,  <Dictator> Charise CarwinSHREYANG H Gio Janoski MD ELECTRONICALLY SIGNED 01/12/2015 14:20

## 2015-02-11 NOTE — Op Note (Signed)
PATIENT NAME:  Ryan Arnold, Ryan Arnold MR#:  [id number] DATE OF BIRTH:  10/02/1961  DATE OF PROCEDURE:  01/30/2015  SURGEON: [name], M.D.   ASSISTANT: [name], M.D. and [name], PAS    PREOPERATIVE DIAGNOSIS:  Interstitial lung disease.   POSTOPERATIVE DIAGNOSIS: Interstitial lung disease.   OPERATION PERFORMED:  Right thoracotomy with biopsy of right upper lobe and right lower lobe.   INDICATIONS FOR PROCEDURE:  [name] is a 54 year old gentleman with a history of recurrent pneumonitis.  He has required several hospitalizations for his underlying intrinsic lung disease and ultimately he was found to be a suitable candidate for an open lung biopsy.  The indications and risks were explained to the patient who gave his informed consent.   DESCRIPTION OF PROCEDURE:  The patient was brought to the operating suite and placed in the supine position.  General endotracheal anesthesia was given through a double-lumen tube. Preoperative bronchoscopy was performed and revealed no evidence of tumor or purulent secretions.  The tube was found to be in the correct position.  The patient was then turned for a right thoracotomy.  All pressure points were carefully padded.  The patient was prepped and draped in the usual sterile fashion.  We began by making a 4 cm to 5 cm incision.  The incision was extended from the tip of the scapula forward.   My intent was to use this as an access incision for a thoracoscopic approach, however because of the patient's very large size, this was not possible to do we had to extend the incision and divide a portion of the latissimus muscle. This raised muscle was not divided but was retracted anteriorly and we gained access to the chest.  Having done so, we then used the thoracoscope to make two small additional ports, one for the thoracoscope itself and one for the stapler.  We then used the access incision to place our various instruments to obtain appropriate material  from the lung.  A clamp was placed through our thoracotomy wound and we obtained an adequate sample of the upper lobe.  This was based upon the preoperative CT scan.  This was performed in the anteromedial fashion of the right upper lobe.  We then took a generous portion of the posterior right lower lobe.  Both of these were taken with an endoscopic stapler.  Portions were excised and sent for appropriate culture. Other pieces were sent for permanent analysis.  A single 32-French chest tube was inserted through our most anterior thoracoscopy port.  This was positioned to the apex of the lung and brought out through a separate stab wound.  The wounds were then all closed.  The ribs were reapproximated with four #1 Vicryl paracostal sutures.  The latissimus muscle was closed with #1 Vicryl, the subcutaneous tissues with 2-0 Vicryl and the skin with skin clips.  The remaining wounds were closed in multiple layers using running absorbable sutures and nylon on the skin. The patient tolerated the procedure well and was rolled in the supine position where he was extubated and taken to the recovery room in stable condition.     ____________________________ [name], MD [id number] D: 01/30/2015 16:58:03 ET T: 01/30/2015 17:28:44 ET JOB#: [id number]  cc: [name], MD, <Dictator> Jasmine DecemberIMOTHY E [name] MD ELECTRONICALLY SIGNED 01/31/2015 11:38

## 2015-02-11 NOTE — H&P (Signed)
PATIENT NAME:  Ryan Arnold, Ryan Arnold MR#:  [id number] DATE OF BIRTH:  [birth year]  DATE OF ADMISSION:  12/12/2014  PRIMARY CARE PHYSICIAN: At [facility], Dr. Selena Batten.  PRIMARY NEPHROLOGIST:   Laverda Sorenson, MD  REFERRING EMERGENCY ROOM PHYSICIAN: Su Ley, MD   CHIEF COMPLAINT: Fever and cough.   HISTORY OF PRESENTING ILLNESS: A 54 year old male who has history of end-stage renal disease, on hemodialysis, type 2 diabetes, hypertension, hyperlipidemia, coronary artery disease, morbidly obese, and CPAP use. He was recently admitted at [facility] 2 weeks ago. He was discharged from there. He was treated for pneumonia with triple antibiotics over there in the hospital, and on discharge he was given some oral antibiotic tablets, which he finished 6 days ago. Even after finishing antibiotic, he continued to have fever, chills, and cough with brownish- producing sputum. He came to the Emergency Room again 3 days ago, but he was told that he has viral illness and sent home without any intervention or antibiotic. Came back today again for the same complaint, and noted to have pneumonia, when it was compared to the x-ray, which was done 3 days ago.  This is new on the right side mid and lower lung, and he had fever in ER, so had admission to hospitalist team after starting broad-spectrum antibiotic. On further questioning, he denies any shortness of breath. His legs are always swollen. He denies any other complaint.  REVIEW OF SYSTEMS:  CONSTITUTIONAL: Positive for fever, negative for fatigue, weakness, pain, or weight loss.  EYES: No blurring, double vision, discharge, or redness.  EARS, NOSE, THROAT: No tinnitus, ear pain, or hearing loss.  RESPIRATORY: The patient has cough, wheezing, and shortness of breath.  CARDIOVASCULAR: No chest pain, orthopnea, edema, arrhythmia, or palpitations.  GASTROINTESTINAL: No nausea, vomiting, diarrhea, abdominal pain.  GENITOURINARY: No dysuria, hematuria, increased  frequency. ENDOCRINE: No heat or cold intolerance. No excessive sweating. SKIN:  No acne, rashes, or lesions. MUSCULOSKELETAL:  No pain, swelling in the joints. NEUROLOGICAL: No numbness, weakness, tremor, or vertigo. PSYCHIATRIC: No anxiety, insomnia, bipolar disorder.   PAST MEDICAL HISTORY: 1.  Type 2 diabetes.  2.  Diabetic retinopathy.  3.  Diabetic neuropathy.  4.  Glaucoma.  5.  Congestive heart failure, diastolic, compensated.  6.  Coronary artery disease.  7.  End-stage renal disease, on hemodialysis Monday, Wednesday, and Friday.  8.  Sleep apnea, on CPAP.  9.  Gastroesophageal reflux disease.  10.  Hyperlipidemia.  11.  Morbid obesity.  12.  Hypertension.  13.  Well-controlled depression.   PAST SURGICAL HISTORY:  1.  Two AV fistulas.  2.  Right knee surgery.  3.  Cholecystectomy.  4.  Right shoulder surgery.   FAMILY HISTORY: Positive for diabetes, hypertension in his mother, no coronary artery disease. No cancer.   SOCIAL HISTORY: The patient has never smoked, does not drink. He does not use drugs. He lives with his wife.   HOME MEDICATIONS: 1.  Sevelamer 3 tablets 3 times a day with meals.  2.  Sensipar 60 mg oral tablet once a day.  3.  Renal vitamin 1 tablet once a day.  4.  NovoLog 25 units subcutaneous 3 times a day before meals and NovoLog as per sliding scale also 3 times a day with meals after checking his sugar. 5.  Metoprolol 50 mg 2 times a day. 6.  Magnesium hydroxide 400 mg oral tablet once a day.  7.  Lantus 74 units subcutaneous once a day at bedtime.  8.  Gabapentin 100 mg oral once a day.  9.  Esomeprazole 20 mg oral once a day.  10.  Duloxetine 60 mg oral 2 capsules once a day.  11.  Clonazepam 0.5 mg oral 2 times a day.  12.  Buspirone 5 mg oral tablet 2 times a day.  13.  Atorvastatin 20 mg oral once a day.  14.  Aspirin 81 mg once a day.  15.  Amlodipine 5 mg oral 2 tablets once a day.  16.  Allopurinol 100 mg oral 2 times a day.  17.   Abilify 2 mg oral tablet once a day at bedtime.   PHYSICAL EXAMINATION: VITAL SIGNS: In ER, temperature 100.3, pulse rate 106, respirations 24, blood pressure 156/66, and oxygen 98 on 2 liters oxygen supplementation.  GENERAL: The patient is fully alert and oriented, not in acute distress. HEAD AND NECK:  Atraumatic. Conjunctivae pink. Sclerae anicteric. Oral mucosa moist.  NECK: Supple. No JVD. Thyroid nontender.  RESPIRATORY: Bilateral equal air entry, slight crepitation present. No wheezing.  CARDIOVASCULAR: No tenderness on local palpation. S1, S2 present, regular. No murmur.  ABDOMEN: Soft, nontender.  Bowel sounds present. No organomegaly.  SKIN: No acne, rashes, or lesions.  MUSCULOSKELETAL: No tenderness or swelling in the joints.  NEUROLOGICAL: No numbness, weakness. Power 5/5, follows commands. Sensation preserved.  No tremor or rigidity.  EXTREMITIES:  Bilateral edema present, which is chronic, as per him. AV fistula present in left arm. PSYCHIATRIC: Does not appear in any acute psychiatric illness at this time.   IMAGING: Chest x-ray, PA and lateral, shows extensive infiltrate in the right mid and lower lobe, most likely representing pneumonia.   LABORATORY DATA: Glucose level 319, BNP is 10,366, BUN 35, creatinine 7.94, sodium 137, potassium 4.7, chloride 99, CO2 of 27. Total protein is 6.6, albumin 2.7, bilirubin 0.5, alkaline phosphatase 227, SGOT 34, SGPT is 55. Troponin 0.13. WBC 11.6, platelet count is 285,000. Hemoglobin 9.4.   ASSESSMENT AND PLAN: A 54 year old male who has past history of end-stage renal disease, on hemodialysis, diabetes, hypertension, congestive heart failure, sleep apnea, and recently treated for pneumonia at [facility] 2 weeks ago. Came with again fever and cough and brownish sputum, found having pneumonia on chest x-ray, which is new compared to x-ray 3 days ago.  1.  Healthcare-associated pneumonia. As the patient was recently in hospital and he is a  dialysis patient, will treat him with vancomycin, Zosyn, and Levaquin for now and we will try to get a sputum culture for him.  Will also call pulmonary consult to rule out any other possibility of him having recurrent pneumonia. Meanwhile, will encourage incentive spirometry and ambulation as it might help.  2.  End-stage renal disease, on hemodialysis.  We will continue dialysis in hospital. Call consult.  3.  Elevated BNP, most likely this is secondary to infection. There is not much pulmonary edema on chest x-ray. The patient has history of diastolic heart failure, but this might be chronic.  4.  Diabetes.  We will give him insulin sliding scale coverage and Lantus in the hospital and we might have to adjust the doses depending on how his sugar stays in the hospital.  5.  Hypertension. We will continue home medication, blood pressure is stable.  6.  Morbid obesity, sleep apnea, and use of CPAP. We would continue his CPAP at nighttime.   CODE STATUS: Full code.   TOTAL TIME SPENT ON THIS ADMISSION: 50 minutes.   ____________________________ Ryan Arnold  Ryan PigeonVachhani, MD vgv:LT D: 12/12/2014 16:30:24 ET T: 12/12/2014 17:03:30 ET JOB#: [id number]  cc: Ryan PigeonVaibhavkumar G. Ryan PigeonVachhani, MD, <Dictator> Laverda SorensonSarath C. Kolluru, MD Altamese DillingVAIBHAVKUMAR Vang Kraeger MD ELECTRONICALLY SIGNED 12/28/2014 11:46

## 2015-02-11 NOTE — Consult Note (Addendum)
PATIENT NAME:  Ryan Arnold, Ryan Arnold MR#:  [id number] DATE OF BIRTH:  06-02-1961  DATE OF ADMISSION:  01/30/2015.  DATE OF CONSULTATION:  02/01/2015.  REFERRING PHYSICIAN:  Dr. [name].  CONSULTING PHYSICIAN:  Katharina Caperima Suliman Termini, MD.  REASON FOR CONSULTATION:  Consult was requested on 02/01/2015 by Dr. [name] in regard to hyperglycemia in a diabetic patient.   HISTORY OF PRESENT ILLNESS:  Patient is a 54 year old African American male who just recently underwent right thoracotomy for interstitial lung disease on 01/30/2015 and has been running high glucose levels today.  His glucose level is noted to be from 300s-450s, and Dr. [name] asked us to see the patient for consultation.  The patient does admit that he has been eating poorly, however, eats probably 50% or less of his offered meals.  He is having some nausea but not significant.  He did have some nausea and vomiting x 1 before surgery when he came into the hospital.  He has been also having some heartburn now but despite that his blood glucose levels are very high.  At home, he has been taking insulin Lantus 50 units subcutaneously at bedtime; however, this insulin is not ordered while he is in the hospital.  He is also on NovoLog at 29 units 3 times daily at home.  Again this medication is not ordered but just sliding scale.    PAST MEDICAL HISTORY:  Significant for history of right thoracotomy on 01/30/2015 by Dr. [name] with open lung biopsy of right upper lobe as well as right lower lobe due to interstitial lung disease; history of diabetes mellitus, type 2, with diabetic neuropathy and retinopathy; hypertension; hyperlipidemia; gout; CHF; hypothyroidism; vitamin D deficiency; peripheral neuropathy as mentioned above; obstructive sleep apnea, on CPAP at home, also chronic respiratory failure, on 2 liters of oxygen per nasal cannula 24/7; history of MRSA sepsis due to left upper extremity AV fistula, which was treated with antibiotic but no surgery; history of  peripharyngeal abscess; end-stage renal disease, on hemodialysis Mondays, Wednesdays, Fridays; history of anemia of chronic disease; secondary hyperparathyroidism; depression.   PAST SURGICAL HISTORY:  Fistula placement in left arm, bilateral cataract removal and lens placement in his eyes bilaterally, laser retinal repair, right patellar tendon repair, left shoulder repair.    SOCIAL HISTORY:  Lives with his wife.  No smoking, alcohol, or drug abuse.  Receives hemodialysis Mondays, Wednesdays, Fridays.    FAMILY HISTORY:  Diabetes mellitus, type 2, as well as hypertension.   MEDICATIONS AT HOME:  The patient is on allopurinol 200 mg p.o. daily; amlodipine 5 mg twice daily; aspirin 81 mg p.o. daily; atorvastatin 10 mg p.o. at bedtime; clonazepam 0.5 mg p.o. at bedtime as needed; duloxetine 60 mg p.o. 2 capsules once daily; erythropoietin 4000 units with hemodialysis; gabapentin 100 mg in the morning; Lantus 50 units subcutaneously at bedtime; Lasix 40 mg p.o. daily on Mondays, Wednesdays, Fridays, and 80 mg on other days; lisinopril 10 mg p.o. daily; magnesium hydroxide 400 mg p.o. daily; metoprolol 25 mg twice daily; Nexium 20 mg p.o. daily; Novolin R 29 units 3 times daily with meals; multivitamins once daily; Sensipar 30 mg p.o. daily; Wellbutrin 75 mg twice daily.    REVIEW OF SYSTEMS:  Pertinent for possible high fevers here in the hospital post surgery, his temperature was 100.1 today on 02/01/2015.  Also sinus pain and congestion, some dry cough, shortness of breath which seems to be chronic.  Also nausea, vomiting prior to surgery, however, nothing recently.   Some  heartburn after surgery.  Otherwise, denies any other symptoms such as fatigue or  weakness, weight loss or gain.  EYES:  Denies any blurry vision, double vision, or glaucoma.  EARS, NOSE AND THROAT:  Denies any tinnitus, allergies, epistaxis, sinus pain, difficulty swallowing.  RESPIRATORY:  Denies any cough, wheezes, asthma, or  COPD. CARDIOVASCULAR:  Denies chest pain, orthopnea, arrhythmias.  Admits to have chest pains on the right side where surgery was performed.     GASTROINTESTINAL:  Denies vomiting with diarrhea  recently.  No hematemesis or change in bowel habits. GENITOURINARY:  Denies dysuria, hematuria, frequency, incontinence.  Admits to making some urine. ENDOCRINE:  Denies any polydipsia, nocturia, thyroid problems, heat or cold intolerance, or thirst.   HEMATOLOGIC:  Denies anemia, easy bruising or bleeding, swollen glands.  SKIN:  Denies acne, rash, change in moles.  MUSCULOSKELETAL: Denise arthritis, cramps, swelling. No numbness, epilepsy, or tremors.  PSYCHIATRIC:   Denies anxiety, insomnia, depression.   PHYSICAL EXAMINATION:  VITAL SIGNS:  During my evaluation, temperature was 100.1.  Pulse was 94.  Respirations were 18-20.  Blood pressure was 166/75.  Saturation was 96% on 2 liters of oxygen per nasal cannula.  GENERAL:  He is a well-developed, well-nourished, obese, African American male sitting on the chair, somewhat somnolent.  HEENT:  His pupils are equal and reactive to light.  Extraocular movements intact.   No icterus.  No jaundice.  The patient does have some difficulty hearing.  No pharyngeal erythema.  Mucosa is moist.  NECK:  No masses.  Supple, nontender.  Thyroid is not enlarged.   No adenopathy.  No JVD  or carotid bruits bilaterally. Full range of motion.  LUNGS:  Clear to auscultation though somewhat diminished breath sounds on the right.  No rales, rhonchi, or wheezing.  No labored inspirations, increased effort, dullness to percussion, overt respiratory distress.  CARDIOVASCULAR:  S1, S2 appreciated.  Rhythm is regular.  PMI not lateralized. Chest is nontender to palpation.  EXTREMITIES:  1+ pedal pulses, 2+ lower extremity edema.  No calf tenderness or cyanosis was noted.  ABDOMEN:  Soft, nontender.  Bowel sounds are present.  No hepatosplenomegaly or masses were noted.  RECTAL:   Deferred.  MUSCULOSKELETAL:  Able to move all extremities.  No cyanosis, degenerative joint disease, or kyphosis. Gait not tested.   SKIN:  Did not reveal any rashes, lesions, erythema, nodularity, or induration.  It was warm and dry to palpation.  LYMPHATIC:  No adenopathy in the cervical region.  NEUROLOGIC:  Cranial nerves grossly intact.  Sensory is intact.  No dysarthria or aphasia.  The patient is alert; oriented to time, person and place; somewhat somnolent intermittently.  The patient is cooperative.  Good judgment.  Memory is good.  No significant confusion, agitation, or depression were noted.   LABORATORY DATA:  Most recent labs done on 01/31/2015 showed BUN and creatinine of 28 and 5.29, potassium 5.9, repeat potassium 4.9. The patient's bicarbonate level was 28. Estimated GFR for African American would be 13.  CBC: White blood cell count 12.6, hemoglobin 9.1, and platelet count was 339,000.  Telemetry revealed sinus rhythm with rate of 90s.  The patient had no EKG on this admission.   RADIOLOGIC STUDIES:  Chest x-ray, portable, single view, on 01/30/2015 showed interval placement of right chest tube without significant pneumothorax or pleural effusion, cardiac enlargement, and pulmonary vascular congestion.  Repeat chest x-ray, portable, single view, on 01/31/2015 revealed minimal interstitial density in her right lung base consistent  with subsegmental atelectasis.  No pneumothorax or significant pleural effusion.  Chest x-ray, portable, single view, on 02/01/2015 revealed postoperative changes on the right.  No pneumothorax or pleural effusion was seen.   ASSESSMENT AND PLAN:  1. Diabetes mellitus, type 2, with hyperglycemia to 400s.  Will resume patient's Lantus at this time.  Will initiate him on lower dose than he usually takes at home, at 20 units daily, dose to be advanced to 50 units as home dose, and we will also start him on NovoLog prior to his meals at lower doses.  Will watch his  p.o. intake and advance medications as needed.  2. Lower extremity swelling.  Will resume Lasix at lower doses at this time and will continue hemodialysis according to the schedule.  3. End-stage renal disease, on hemodialysis.  Per nephrology.  4. Essential hypertension.  Will continue home medications.  Will also resume Lasix, following his blood pressure readings.   Advance blood pressure medications if needed.  5. Right thoracotomy for suspected interstitial lung disease.  Will continue oxygen therapy as well as pain medications as needed.    Thank you for the consultation.  Will follow the patient while he is in the hospital.   TIME SPENT:  Fifty minutes.     ____________________________ Katharina Caper, MD rv:kc D: 02/01/2015 14:11:36 ET T: 02/01/2015 14:48:48 ET JOB#: [id number]  cc: Katharina Caper, MD, <Dictator> Cristian Grieves MD ELECTRONICALLY SIGNED 02/09/2015 19:13

## 2015-02-12 NOTE — Discharge Summary (Addendum)
PATIENT NAME:  Ryan Arnold, Ryan Arnold DATE OF BIRTH:  1961-01-08  DATE OF ADMISSION:  01/30/2015 DATE OF DISCHARGE:  02/05/2015  ADMITTING DIAGNOSIS:  Interstitial lung disease.   DISCHARGE DIAGNOSIS:  Interstitial lung disease.   OPERATION PERFORMED:  Right thoracoscopy with biopsy of right upper and right lower lobes.   HOSPITAL COURSE:  [name] is a 54 year old gentleman with end-stage renal disease on hemodialysis who has a history of multiple pneumonias admitted to the hospital on several occasions for treatment.  He has been extensively evaluated and was found to have an interstitial lung process and required an open lung biopsy for definitive diagnosis.  He was brought to the operating room on the day of admission where he underwent a small thoracotomy for an access incision and two small thoracoscopy sites.  Biopsies were obtained of the right upper and right lower lobes.  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  At the time of discharge he was unable to void on his own.  This was felt secondary to perhaps narcotic use and a Foley catheter was inserted and he was discharged with that.  His chest tube was removed prior to discharge after no air leak had been present for several days.  He was evaluated by our pulmonologist and our intensivist and appropriate modifications were made in his medications. At the time of discharge he was scheduled to follow up with his primary care physician as well as with Dr. Cherylann RatelLateef and myself.  He will be seen in one week for a repeat chest x-ray and a wound check.    ____________________________ [name], MD [id number] D: 02/09/2015 14:54:05 ET T: 02/09/2015 20:44:45 ET JOB#: [id number]  cc: Marcial Pacasimothy E. [name], MD, <Dictator> Jasmine DecemberIMOTHY E Tannon Peerson MD ELECTRONICALLY SIGNED 02/21/2015 16:13

## 2015-02-15 ENCOUNTER — Ambulatory Visit: Payer: Medicare Other | Admitting: Cardiothoracic Surgery

## 2015-02-20 LAB — CULTURE, FUNGUS WITHOUT SMEAR

## 2015-02-26 ENCOUNTER — Telehealth: Payer: Self-pay | Admitting: Emergency Medicine

## 2015-02-27 ENCOUNTER — Emergency Department
Admission: EM | Admit: 2015-02-27 | Discharge: 2015-02-27 | Discharge status: Against Medical Advice | Payer: BC Managed Care – PPO | Attending: Obstetrics and Gynecology | Admitting: Obstetrics and Gynecology

## 2015-02-27 ENCOUNTER — Encounter: Payer: Self-pay | Admitting: Emergency Medicine

## 2015-02-27 ENCOUNTER — Other Ambulatory Visit: Payer: Self-pay

## 2015-02-27 DIAGNOSIS — N186 End stage renal disease: Secondary | ICD-10-CM | POA: Insufficient documentation

## 2015-02-27 DIAGNOSIS — R0602 Shortness of breath: Secondary | ICD-10-CM | POA: Insufficient documentation

## 2015-02-27 DIAGNOSIS — R079 Chest pain, unspecified: Secondary | ICD-10-CM | POA: Diagnosis not present

## 2015-02-27 DIAGNOSIS — I120 Hypertensive chronic kidney disease with stage 5 chronic kidney disease or end stage renal disease: Secondary | ICD-10-CM | POA: Insufficient documentation

## 2015-02-27 DIAGNOSIS — Z992 Dependence on renal dialysis: Secondary | ICD-10-CM | POA: Diagnosis not present

## 2015-02-27 LAB — CBC
HCT: 28.6 % — ABNORMAL LOW (ref 40.0–52.0)
Hemoglobin: 9.5 g/dL — ABNORMAL LOW (ref 13.0–18.0)
MCH: 28.9 pg (ref 26.0–34.0)
MCHC: 33.3 g/dL (ref 32.0–36.0)
MCV: 86.7 fL (ref 80.0–100.0)
Platelets: 355 10*3/uL (ref 150–440)
RBC: 3.3 MIL/uL — AB (ref 4.40–5.90)
RDW: 18.5 % — ABNORMAL HIGH (ref 11.5–14.5)
WBC: 8.4 10*3/uL (ref 3.8–10.6)

## 2015-02-27 LAB — BASIC METABOLIC PANEL
Anion gap: 12 (ref 5–15)
BUN: 30 mg/dL — ABNORMAL HIGH (ref 6–20)
CALCIUM: 8.1 mg/dL — AB (ref 8.9–10.3)
CO2: 31 mmol/L (ref 22–32)
Chloride: 97 mmol/L — ABNORMAL LOW (ref 101–111)
Creatinine, Ser: 6.03 mg/dL — ABNORMAL HIGH (ref 0.61–1.24)
GFR calc Af Amer: 11 mL/min — ABNORMAL LOW (ref >60)
GFR, EST NON AFRICAN AMERICAN: 10 mL/min — AB (ref >60)
GLUCOSE: 156 mg/dL — AB (ref 65–99)
POTASSIUM: 4.4 mmol/L (ref 3.5–5.1)
SODIUM: 140 mmol/L (ref 135–145)

## 2015-02-27 LAB — BRAIN NATRIURETIC PEPTIDE: B Natriuretic Peptide: 544 pg/mL — ABNORMAL HIGH (ref 0.0–100.0)

## 2015-02-27 LAB — TROPONIN I: Troponin I: <0.03 ng/mL (ref <0.031)

## 2015-02-27 NOTE — ED Notes (Signed)
Patient to ED with c/o right side chest pain off and on for several days, patient reports history of lung biopsy 3 weeks ago, since that time he has had pain but it got worse over the last 3 days. Patient also reports worsening shortness of breath, has oxygen at home that he wears as needed and has to wear more often.

## 2015-02-28 ENCOUNTER — Telehealth: Payer: Self-pay | Admitting: Emergency Medicine

## 2015-03-01 ENCOUNTER — Other Ambulatory Visit: Payer: Self-pay | Admitting: *Deleted

## 2015-03-01 ENCOUNTER — Encounter: Payer: Self-pay | Admitting: Internal Medicine

## 2015-03-01 ENCOUNTER — Ambulatory Visit: Payer: Medicare Other

## 2015-03-01 ENCOUNTER — Ambulatory Visit
Admission: RE | Admit: 2015-03-01 | Discharge: 2015-03-01 | Disposition: A | Discharge status: Home / Self Care | Payer: BC Managed Care – PPO | Source: Ambulatory Visit | Attending: Internal Medicine | Admitting: Internal Medicine

## 2015-03-01 ENCOUNTER — Ambulatory Visit (INDEPENDENT_AMBULATORY_CARE_PROVIDER_SITE_OTHER): Payer: BC Managed Care – PPO | Admitting: Internal Medicine

## 2015-03-01 VITALS — BP 152/84 | HR 81 | Ht 73.0 in | Wt 300.0 lb

## 2015-03-01 DIAGNOSIS — J189 Pneumonia, unspecified organism: Secondary | ICD-10-CM | POA: Insufficient documentation

## 2015-03-01 DIAGNOSIS — R0789 Other chest pain: Secondary | ICD-10-CM | POA: Diagnosis not present

## 2015-03-01 DIAGNOSIS — J8489 Other specified interstitial pulmonary diseases: Secondary | ICD-10-CM | POA: Diagnosis not present

## 2015-03-01 DIAGNOSIS — G4733 Obstructive sleep apnea (adult) (pediatric): Secondary | ICD-10-CM | POA: Diagnosis not present

## 2015-03-01 DIAGNOSIS — Z7952 Long term (current) use of systemic steroids: Secondary | ICD-10-CM

## 2015-03-01 DIAGNOSIS — J90 Pleural effusion, not elsewhere classified: Secondary | ICD-10-CM | POA: Diagnosis not present

## 2015-03-01 DIAGNOSIS — J984 Other disorders of lung: Secondary | ICD-10-CM | POA: Diagnosis not present

## 2015-03-01 DIAGNOSIS — R0600 Dyspnea, unspecified: Secondary | ICD-10-CM

## 2015-03-01 MED ORDER — PREDNISONE 20 MG PO TABS: 80.0000 mg | ORAL_TABLET | Freq: Every day | ORAL | Status: DC

## 2015-03-01 NOTE — Progress Notes (Signed)
MRN# [id number] Ryan FellsMyron W Arnold 06/07/1961   CC: Short of breath Chief Complaint  Patient presents with  . Follow-up    Results; SOB w/activity; CP w/ stinging & sharp pain x 2 weeks; went to ED 5/17;       Brief History: synopsis: 54 year old male past medical history of end-stage renal disease on dialysis, uncontrolled diabetes, hypertension, former retired Emergency planning/management officerpolice officer, seen in consultation for recurrent pneumonias. [facility] and [facility] over the past one year for bilateral groundglass opacities throughout entire lung fields requiring multiple rounds of antibiotics and steroids. [facility] and within the last 2 years no acute findings from the BAL. Patient with recent bronchoscopy, March 2016, [facility] by Dr. Dema SeverinMungal, BAL with no acute findings.  April 2016 surgical lung bx (wedge) by Dr. [name], path confirms BOOP, started on high dose steroids.   HPI March 2016 Patient is a pleasant 54 year old male presents today for hospital followup. Patient has a past medical history of end stage renal disease on dialysis, diabetes, hypertension, renal osteodystrophy, diastolic heart failure. Patient was seen in the hospital for recurrent pneumonias from March 1 to March 7, and during that time he had a bronchoscopy with BAL. Given his recurrent bouts of pneumonia over the past one year and being seen at [facility] and [facility] over the past one year with noted ilateral groundglass opacities throughout the entire lung fields on chest CT and he was further evaluated by Dr. [name] ([facility]) for surgical lung biopsy, however given the patient was improving patient declined further surgical lung biopsy at that time. See below for full details from the hospital consult note on 12/13/2014. During this hospitalization patient also had a vasculitis panel which was negative. Patient does was readmitted since I last saw him at the beginning of March, again to [facility]  last week for hypoxia shortness of breath and atypical chest pain. He was again diagnosed with recurrent bilateral pneumonia (HCAP), acute on chronic diastolic heart failure. During this recent hospitalization he was treated for fluid overload, pneumonia, after receiving BiPAP and dialysis his breathing improved, he was prophylactically placed on antibiotics given the results of his CT scan which again showed bilateral upper lobe pneumonia.  Off note, he has had a [facility] in past 1.5 years, per the patient he said that there was no acute findings from that. Today patient states that he still has a mild dry cough, his shortness of breath has improved but he is concerned that since being off antibiotics his "pneumonia" may return. He was discharged [facility] with 2 L of oxygen as needed, currently only wearing oxygen when needed - if out walking, or performing chores, which he subjectively judges his O2 needs.  His PMD ([facility] - Dr. Arty BaumgartnerAlazea Kim) ordered a sleep study for him, which he performed on 01/13/15, per patient he will require CPAP therapy.  Plan - referral to Dr. [name] for surgical lung bx.   Events since last clinic visit: Patient resents today for follow-up visit of his recent open lung biopsy. He had a recent ED visit for chest pain and sob Prior to starting his 6 minute walk test today he was complaining of intense shortness of breath along with chest discomfort, he was sent for a chest x-ray would like to discuss the results of it. Today he is also accompanied by his wife. He also endorses cough.    Medication:  Current Outpatient Rx  Name  Route  Sig  Dispense  Refill  . albuterol (PROVENTIL HFA;VENTOLIN HFA) 108 (90 BASE) MCG/ACT inhaler   Inhalation   Inhale 108 mcg into the lungs as needed.         [name] allopurinol (ZYLOPRIM) 100 MG tablet   Oral   Take 100 mg by mouth daily.         [name] amLODipine (NORVASC) 5 MG tablet   Oral   Take 5 mg by  mouth daily.      1   . atorvastatin (LIPITOR) 20 MG tablet   Oral   Take 20 mg by mouth at bedtime.         [name] buPROPion (WELLBUTRIN) 75 MG tablet   Oral   Take 75 mg by mouth 2 (two) times daily.      0   . DULoxetine (CYMBALTA) 60 MG capsule   Oral   Take 120 mg by mouth daily.      0   . epoetin alfa (EPOGEN,PROCRIT) 4000 UNIT/ML injection   Subcutaneous   Inject 0.55 mLs into the skin 3 (three) times a week.         . furosemide (LASIX) 40 MG tablet   Oral   Take 40 mg by mouth 2 (two) times daily.         . furosemide (LASIX) 80 MG tablet   Oral   Take 80 mg by mouth 3 (three) times a week.      0   . gabapentin (NEURONTIN) 100 MG capsule   Oral   Take 100 mg by mouth daily.      1   . insulin aspart (NOVOLOG) 100 UNIT/ML FlexPen   Subcutaneous   Inject 25 Units into the skin 3 (three) times daily - between meals and at bedtime.         [name] LANTUS SOLOSTAR 100 UNIT/ML Solostar Pen   Subcutaneous   Inject 50 Units into the skin daily.      1     Dispense as written.   [name] lisinopril (PRINIVIL,ZESTRIL) 20 MG tablet   Oral   Take 20 mg by mouth daily.         . magnesium oxide (MAG-OX) 400 MG tablet   Oral   Take 400 mg by mouth daily.         . metoprolol (LOPRESSOR) 50 MG tablet   Oral   Take 50 mg by mouth 2 (two) times daily.         . multivitamin (RENA-VIT) TABS tablet   Oral   Take 1 tablet by mouth daily.         . nitroGLYCERIN (NITROSTAT) 0.4 MG SL tablet   Sublingual   Place 0.4 mg under the tongue as needed.         . pantoprazole (PROTONIX) 40 MG tablet   Oral   Take 40 mg by mouth daily.         . SENSIPAR 30 MG tablet   Oral   Take 30 mg by mouth daily.           Dispense as written.   . sevelamer carbonate (RENVELA) 800 MG tablet   Oral   Take 800 mg by mouth 3 (three) times daily.         [name] EXPIRED: clonazePAM (KLONOPIN) 0.5 MG tablet   Oral   Take 0.5 mg by mouth at bedtime as needed.  Review of Systems: Gen:  Denies  fever, sweats, chills HEENT: Denies blurred vision, double vision, ear pain, eye pain, hearing loss, nose bleeds, sore throat Cvc:  No dizziness, chest pain or heaviness Resp:   Admits to: Cough and shortness of breath. Gi: Denies swallowing difficulty, stomach pain, nausea or vomiting, diarrhea, constipation, bowel incontinence Gu:  Denies bladder incontinence, burning urine Ext:   No Joint pain, stiffness or swelling Skin: No skin rash, easy bruising or bleeding or hives Endoc:  No polyuria, polydipsia , polyphagia or weight change Other:  All other systems negative  Allergies:  Oxycodone-acetaminophen  Physical Examination:  VS: BP 152/84 mmHg  Pulse 81  Ht 6\' 1"  (1.854 m)  Wt 300 lb (136.079 kg)  BMI 39.59 kg/m2  SpO2 95%  General Appearance: No distress  HEENT: PERRLA, no ptosis, no other lesions noticed Pulmonary:normal breath sounds., mild dec BS at the bases, diaphragmatic excursion normal.No wheezing, No rales   Chest Wall - tender to point palpation at on the right, around ICS 4 (anteriorly).  Cardiovascular:  Normal S1,S2.  No m/r/g.     Abdomen:Exam: Benign, Soft, non-tender, No masses  Skin:   warm, no rashes, no ecchymosis  Extremities: normal, no cyanosis, clubbing, warm with normal capillary refill.      Rad results: (The following images and results were reviewed by Dr. Dema SeverinMungal).  CXR 03/01/2015    Surgical Lung Bx 01/2015 DIAGNOSIS:  A. LUNG, RIGHT UPPER LOBE; WEDGE BIOPSY:  - BRONCHIOLITIS OBLITERANS WITH ORGANIZING PNEUMONIA.  - SEE NOTE AND CONSULTATION REPORT FROM DR. Almyra [facility]OMAS [facility] REPORT [id number].   B. LUNG, RIGHT LOWER LOBE; WEDGE BIOPSY:  - MILD EMPHYSEMA, PATCHY PIGMENTED ALVEOLAR MACROPHAGES, AND FOCAL  MINIMAL FIBROSIS.   NOTE:  Dr. Laverna PeaceSporn includes the following comment in his report:  Sections of lung tissue demonstrate foci of proliferative  bronchiolitis  obliterans with plugs of edematous myxoid connective tissue (Masson  bodies) present in small airways and extending focally into alveolar  ducts and sacs. The alveolar tissue adjacent to these foci shows mild  chronic interstitial inflammation but otherwise shows patchy and  non-specific fibroinflammatory changes without evidence for a separate  component of diffuse interstitial disease. Stains to detect infection  with bacteria, fungi and acid fast bacilli (GMS, Brown-Brenn and AFB)  are negative. Bronchiolitis obliterans with organizing pneumonia (BOOP)  is a non-specific injury pattern which may develop as a post-infectious,  post-inflammatory or idiopathic process as well as complicating other  systemic inflammatory disorders. Clinical and radiologic correlation in  that regard is necessary.    Assessment and Plan: 54 year old male with recurrent pneumonia, status post surgical lung biopsy, now diagnosed with BOOP.  BOOP (bronchiolitis obliterans with organizing pneumonia) BOOP - surgical biopsy proven Unknown trigger.  Plan: - Prednisone 80mg  daily with breakfast until follow up visit - Anticipate steroid treatment for 6-9 months. If there is another relapse of BOOP, will have to consider another bronchoscopy to rule out atypical infections exacerbating BOOP. - continue with 3L O2 with exertion - Referral to see an Endocrinologist (Dr. Welford RochePhadke) to help with Diabetes control while on high dose steroids.     Dyspnea Secondary to BOOP. See plan for BOOP.   OSA (obstructive sleep apnea) Currently being worked up by primary care physician. Completed overnight polysomnography on 01/13/2015. Plan-follow with primary care physician for results on OSA study and further management.     Recurrent pneumonia Secondary to BOOP. See plan for BOOP.  Updated Medication List Outpatient Encounter Prescriptions as of 03/01/2015  Medication Sig  . albuterol  (PROVENTIL HFA;VENTOLIN HFA) 108 (90 BASE) MCG/ACT inhaler Inhale 108 mcg into the lungs as needed.  [name] allopurinol (ZYLOPRIM) 100 MG tablet Take 100 mg by mouth daily.  [name] amLODipine (NORVASC) 5 MG tablet Take 5 mg by mouth daily.  [name] atorvastatin (LIPITOR) 20 MG tablet Take 20 mg by mouth at bedtime.  [name] buPROPion (WELLBUTRIN) 75 MG tablet Take 75 mg by mouth 2 (two) times daily.  . DULoxetine (CYMBALTA) 60 MG capsule Take 120 mg by mouth daily.  [name] epoetin alfa (EPOGEN,PROCRIT) 4000 UNIT/ML injection Inject 0.55 mLs into the skin 3 (three) times a week.  . furosemide (LASIX) 40 MG tablet Take 40 mg by mouth 2 (two) times daily.  . furosemide (LASIX) 80 MG tablet Take 80 mg by mouth 3 (three) times a week.  . gabapentin (NEURONTIN) 100 MG capsule Take 100 mg by mouth daily.  . insulin aspart (NOVOLOG) 100 UNIT/ML FlexPen Inject 25 Units into the skin 3 (three) times daily - between meals and at bedtime.  [name] LANTUS SOLOSTAR 100 UNIT/ML Solostar Pen Inject 50 Units into the skin daily.  [name] lisinopril (PRINIVIL,ZESTRIL) 20 MG tablet Take 20 mg by mouth daily.  . magnesium oxide (MAG-OX) 400 MG tablet Take 400 mg by mouth daily.  . metoprolol (LOPRESSOR) 50 MG tablet Take 50 mg by mouth 2 (two) times daily.  . multivitamin (RENA-VIT) TABS tablet Take 1 tablet by mouth daily.  . nitroGLYCERIN (NITROSTAT) 0.4 MG SL tablet Place 0.4 mg under the tongue as needed.  . pantoprazole (PROTONIX) 40 MG tablet Take 40 mg by mouth daily.  . SENSIPAR 30 MG tablet Take 30 mg by mouth daily.  . sevelamer carbonate (RENVELA) 800 MG tablet Take 800 mg by mouth 3 (three) times daily.  . clonazePAM (KLONOPIN) 0.5 MG tablet Take 0.5 mg by mouth at bedtime as needed.   No facility-administered encounter medications on file as of 03/01/2015.    Orders for this visit: Orders Placed This Encounter  Procedures  . DG Chest 2 View    Standing Status: Future     Number of Occurrences: 1     Standing Expiration Date:  04/30/2016    Order Specific Question:  Reason for Exam (SYMPTOM  OR DIAGNOSIS REQUIRED)    Answer:  chest discomfort    Order Specific Question:  Preferred imaging location?    Answer:  [facility]    Thank  you for the visitation and for allowing  [facility] to assist in the care of your patient. Our recommendations are noted above.  Please contact us if we can be of further service.  Stephanie Acre, MD [facility] Office Number: [phone]

## 2015-03-01 NOTE — Patient Instructions (Signed)
Follow up with Dr. Dema SeverinMungal in 4-6wks - Prednisone 80mg  daily with breakfast until follow up visit - continue with 3L O2 with exertion - see information below for you diagnosis - you have an inflammatory lung disease called BOOP/COP (Crytogenic Organizing Pneumonia), that will require prolong treatment with steroids (6-609months).  If you get another relapse of BOOP/COP during treatment, then another bronchoscopy will be performed. - we will make a referral for you to see an Endocrinologist (Dr. Welford RochePhadke) to help with Diabetes control while you are on high dose steroids.      Bronchiolitis Obliterans Bronchiolitis obliterans is a disease that causes destruction and scarring of the small airways of the lungs. As the scarring and hardening of the lungs gets worse, it becomes harder to breathe. This disease is similar to emphysema, which can be caused by smoking. However, bronchiolitis obliterans patients usually have normal oxygen exchange in their lungs. Emphysema patients do not. This condition is essentially irreversible and treatment is mainly supportive. The lungs can never return to normal. There are measures you can take which will improve them and make you feel better. CAUSES  Irritant fume exposure, such as exposure to chlorine, ammonia, nitrogen oxides, or sulfur dioxide.  Respiratory infections.  Connective tissue disorders, such as rheumatoid arthritis or lupus.  Hormone producing lung diseases, such as neuroendocrine cell hyperplasia.  Medication reaction.  Bone marrow, lung, or heart-lung transplants.  Unknown. SYMPTOMS   Shortness of breath.  Chronic bronchitis.  Exercise intolerance, worsens with exertion.  Long-lasting cough.  Asthma. DIAGNOSIS   Breathing (pulmonary function) tests can show difficulty moving air in and out of the lungs. This is called lung obstruction. In the case of bronchiolitis obliterans, that obstruction is "fixed." This means it does not  respond to normal asthma medications.  A biopsy of lung tissue can be done to prove the diagnosis. This is taking out a small piece of lung tissue. It can then be looked at under a microscope by a specialist. HOME CARE INSTRUCTIONS   If you smoke, stop smoking. The carbon monoxide buildup in the blood robs you of your already short oxygen supply.  Take your antibiotics as directed. Finish them even if you start to feel better.  Avoid antihistamines and cough syrups. They dry your system up and slow down the elimination of secretions. This decreases respiratory capacity and may lead to infections.  Drink enough fluids to keep your urine clear or pale yellow. This loosens secretions.  Use humidifiers in your home and at bedside if they do not make breathing difficult.  Receive all protective vaccines your caregiver suggests, especially pneumococcal and influenza.  Use home oxygen as suggested. SEEK IMMEDIATE MEDICAL CARE IF:   Sputum becomes pus-like (purulent).  An oral temperature above 102 F (38.9 C) lasts 2 days or more, and is not controlled by medication.  Breathing is more labored or it becomes difficult to exercise. MAKE SURE YOU:   Understand these instructions.  Will watch your condition.  Will get help right away if you are not doing well or get worse. Document Released: 08/28/2004 Document Revised: 02/13/2014 Document Reviewed: 05/17/2008 [facility] Patient Information 2015 [employer] [place]. This information is not intended to replace advice given to you by your health care provider. Make sure you discuss any questions you have with your health care provider.

## 2015-03-07 DIAGNOSIS — G4733 Obstructive sleep apnea (adult) (pediatric): Secondary | ICD-10-CM | POA: Diagnosis not present

## 2015-03-07 DIAGNOSIS — F329 Major depressive disorder, single episode, unspecified: Secondary | ICD-10-CM | POA: Diagnosis not present

## 2015-03-08 ENCOUNTER — Ambulatory Visit: Payer: Medicare Other | Admitting: Cardiothoracic Surgery

## 2015-03-08 LAB — SURGICAL PATHOLOGY

## 2015-03-14 NOTE — Assessment & Plan Note (Signed)
Currently being worked up by primary care physician. Completed overnight polysomnography on 01/13/2015. Plan-follow with primary care physician for results on OSA study and further management.

## 2015-03-14 NOTE — Assessment & Plan Note (Signed)
Secondary to BOOP. See plan for BOOP.

## 2015-03-14 NOTE — Assessment & Plan Note (Signed)
Secondary to BOOP. See plan for BOOP. 

## 2015-03-14 NOTE — Assessment & Plan Note (Addendum)
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  Plan: - Prednisone 80mg  daily with breakfast until follow up visit - Anticipate steroid treatment for 6-9 months. If there is another relapse of BOOP, will have to consider another bronchoscopy to rule out atypical infections exacerbating BOOP. - continue with 3L O2 with exertion - Referral to see an Endocrinologist (Dr. Welford RochePhadke) to help with Diabetes control while on high dose steroids.

## 2015-03-15 ENCOUNTER — Encounter: Payer: Self-pay | Admitting: Endocrinology

## 2015-03-15 ENCOUNTER — Ambulatory Visit (INDEPENDENT_AMBULATORY_CARE_PROVIDER_SITE_OTHER): Payer: BC Managed Care – PPO | Admitting: Endocrinology

## 2015-03-15 ENCOUNTER — Encounter: Payer: Self-pay | Admitting: Cardiothoracic Surgery

## 2015-03-15 ENCOUNTER — Inpatient Hospital Stay: Payer: Medicare Other | Attending: Cardiothoracic Surgery | Admitting: Cardiothoracic Surgery

## 2015-03-15 ENCOUNTER — Encounter (INDEPENDENT_AMBULATORY_CARE_PROVIDER_SITE_OTHER): Payer: Self-pay

## 2015-03-15 VITALS — BP 138/76 | HR 76 | Resp 16 | Ht 73.0 in | Wt 305.5 lb

## 2015-03-15 VITALS — BP 144/77 | HR 75 | Temp 97.0°F | Resp 20 | Ht 73.0 in | Wt 304.5 lb

## 2015-03-15 DIAGNOSIS — H3523 Other non-diabetic proliferative retinopathy, bilateral: Secondary | ICD-10-CM | POA: Diagnosis not present

## 2015-03-15 DIAGNOSIS — N186 End stage renal disease: Secondary | ICD-10-CM | POA: Diagnosis not present

## 2015-03-15 DIAGNOSIS — H3520 Other non-diabetic proliferative retinopathy, unspecified eye: Secondary | ICD-10-CM | POA: Insufficient documentation

## 2015-03-15 DIAGNOSIS — E785 Hyperlipidemia, unspecified: Secondary | ICD-10-CM | POA: Diagnosis not present

## 2015-03-15 DIAGNOSIS — J849 Interstitial pulmonary disease, unspecified: Secondary | ICD-10-CM

## 2015-03-15 DIAGNOSIS — E103519 Type 1 diabetes mellitus with proliferative diabetic retinopathy with macular edema, unspecified eye: Secondary | ICD-10-CM

## 2015-03-15 DIAGNOSIS — Z992 Dependence on renal dialysis: Secondary | ICD-10-CM

## 2015-03-15 DIAGNOSIS — E10351 Type 1 diabetes mellitus with proliferative diabetic retinopathy with macular edema: Secondary | ICD-10-CM

## 2015-03-15 DIAGNOSIS — E108 Type 1 diabetes mellitus with unspecified complications: Secondary | ICD-10-CM | POA: Insufficient documentation

## 2015-03-15 LAB — AFB CULTURE WITH SMEAR (NOT AT ARMC)
ACID FAST CULTURE: NEGATIVE
ACID FAST SMEAR: NEGATIVE
Acid Fast Culture: NEGATIVE
Acid Fast Smear: NEGATIVE

## 2015-03-15 LAB — HEMOGLOBIN A1C: HEMOGLOBIN A1C: 7 % — AB (ref 4.6–6.5)

## 2015-03-15 NOTE — Assessment & Plan Note (Signed)
Is on statin, ?managed by PCP/nephrology

## 2015-03-15 NOTE — Progress Notes (Signed)
Pre visit review using our clinic review tool, if applicable. No additional management support is needed unless otherwise documented below in the visit note. 

## 2015-03-15 NOTE — Assessment & Plan Note (Signed)
Discussed with the patient regarding basal/bolus therapy and expected response to high dose steroids.  Due to multiple comorbidities, would aim for less stringent A1c. Suspect that recent night time lows, were related to his corrective boluses with Novolog and I have asked him not to do this. Follow Novolog scale at meal times only.   Patient Instructions   Check sugars 4 x daily ( before each meal and at bedtime).  Record them in a log book and bring that/meter to next appointment.   Dont give additional units for Novolog at bedtime.  Lets try to restrict Novolog to meal times only.   Split lantus dosing as below. Follow Novolog chart as below.    Blood Glucose (mg/dL)  Breakfast  (Units Novolog Insulin)  Lunch  (Units Novolog Insulin)  Supper  (Units Novolog Insulin)  Nighttime (Units Novolog Insulin)   <70 Treat the low blood sugar.  Recheck blood glucose  in 15 mins. If sugar is more than 70, then take the number of units of insulin in the 70-90 row, if before a meal.   70-90  12  12  12   0  91-130 (Base Dose)  20  20  20   0   131-150  21  21  21   0   151-200  22  22  22   0   201-250  23  23  23  0   251-300  24  24  24 1    301-350  25  25  25 2    351-400  26  26  26  3    401-450  27  27  27 4    >450  28  28  28  5    Lantus insulin 18 units morning and 10 units at night time units subcutaneous     Please come back for a follow-up appointment in 2 weeks Notify me sooner if FS are consistently over 250 or starting to go low.

## 2015-03-15 NOTE — Assessment & Plan Note (Signed)
Followed by nephrology. Dialysis schedule M/W/F

## 2015-03-15 NOTE — Progress Notes (Signed)
[facility]  Name: Ryan FellsMyron W Arnold Datee:03/15/2015 MRN: [id number] DOB:01/31/1961  Subjective: [name] returns today in follow-up. He continues to have some mild shortness of breath. He's been started on prednisone and feels that this may have made some improvement overall. He remains on dialysis and his only new problem is he's had some increase in his peripheral edema. States he's been to the endocrinologist today and they did check his hemoglobin A1c although he is on sure what that result was.  Objective: On physical exam he is an obese African-American male in no distress. His right thoracotomy wound is well healed. His lungs show bilateral basilar rhonchi. His heart regular. He does have a functioning AV fistula in the left arm.  Assessment: Overall I think that he is doing well from a surgical standpoint. I did not see any problems with his surgical procedure and believe that he can follow-up with me as needed  Plan: He may follow-up with me on an as-needed basis.

## 2015-03-15 NOTE — Assessment & Plan Note (Signed)
Has opthalmology follow up tomorrow.

## 2015-03-15 NOTE — Progress Notes (Signed)
Patient ID: Ryan Arnold, male   DOB: Mar 22, 1961, 54 y.o.   MRN: [id number]   Reason for visit-  Ryan Arnold is a 54 y.o.-year-old male, referred by his Pulmonologist, Dr Dema Severin,  for management of Type 1 diabetes, uncontrolled, with complications ( ESRD on dialysis, proliferative retinopathy, neuropathy, hypoglycemia unawareness).  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 Now on prednisone 80 mg daily in the morning, no taper till next few months. Follow up pulm at end of month.  PCP is Dr. Arty Baumgartner ( [facility])-Care everywhere reviewed. Was seeing Dr Ivar Drape at [facility], now her wait time is till Sept, and pulm wanted him to be seen sooner- hence the referral.    HPI- Patient has been diagnosed with Type 1 diabetes at age 15. Recalls being initially on lifestyle modifications.   he has  been on insulin since diagnosis. Has tried other insulin therapies in the past. Was on insulin pump 2 years ago, and then taken off pump during hospitalization and never put back on it. 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 "Now, it doesn't make a difference to him" re: pump vs MDI.   Pt is currently on a regimen of: - Lantus 25 units qhs ( adjusted down due to night time lows , was taking 30 units till recently) - Novolog  25 units qac TID plus scale ISF 25 Doesn't use the sliding scale with meals Has been correcting sugars at bedtime on following scale( 200: 10 units, 250 : 15 units, 500 : 20 units)   Last hemoglobin A1c was:  8.4% March 2016 9.4% Dec 2015 9.8% Sept 2015  Lab Results  Component Value Date   HGBA1C 7.0* 03/15/2015     Pt checks his sugars 2-3 a day . Uses one touch glucometer. By meter download they are:  PREMEAL Breakfast Lunch Dinner Bedtime Overall  Glucose range: 65-213  333-521 115-330   Mean/median:        POST-MEAL  PC Breakfast PC Lunch PC Dinner  Glucose range:     Mean/median:       Hypoglycemia-  No lows. Lowest sugar was 65 ; he hasn't hypoglycemia awareness.  Dietary habits- eats 2 times daily. Tries to limit carbs, sweetened beverages, sodas, desserts.  Eats out most of the time Sweet tea mixed with unsweet - 2 glasses daily  Exercise-  On continuous 3L oxygen, getting better with SOB Weight - going up Wt Readings from Last 3 Encounters:  03/15/15 305 lb 8 oz (138.574 kg)  03/01/15 300 lb (136.079 kg)  02/27/15 260 lb (117.935 kg)    Diabetes Complications-  Nephropathy- Yes, Stage5  CKD on HD since jan 2015, last BUN/creatinine- GFR 8-14. Followed by Dr Ronn Melena. Patient is on living donor transplant list. HD q M/W/F Lab Results  Component Value Date   BUN 30* 02/27/2015   CREATININE 6.03* 02/27/2015   No results found for: GFR   Retinopathy- Yes, Next DEE  is tomorrow morning Neuropathy- yes numbness and tingling in )his feet. known neuropathy. Rxed with gabapentin and cymbalta. Associated history - No CAD . No prior stroke. No hypothyroidism. his last TSH was No results found for: TSH  TSH 5.5 in October 2015. Prior was mildy elevated in presence of normal free T4 around May 2015.  Hyperlipidemia-  his last set of lipids were- Currently  on lipitor . Tolerating well.  No results found for: CHOL, HDL, LDLCALC, LDLDIRECT, TRIG, CHOLHDL   May 2015 TC 185,LDL85,HDL78,TG139  Blood Pressure/HTN- Patient's blood pressure is well controlled today on current regimen that includes ACE-I ( lisinopril).  Pt has FH of DM in mother, T1DM. Retired Nurse, adult.  I have reviewed the patient's past medical history, family and social history, surgical history, medications and allergies.  Past Medical History  Diagnosis Date  . 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 Hypertension   . Hyperlipemia   . End stage renal disease   . Chronic kidney disease (CKD), dialysis  modality undecided    Past Surgical History  Procedure Laterality Date  . Cataract    . Shoulder surgery Left   . Knee surgery Right   . Gallbladder surgery    . Lung biopsy     Family History  Problem Relation Age of Onset  . Diabetes Mother   . Hypertension Mother    History   Social History  . Marital Status: Married    Spouse Name: N/A  . Number of Children: N/A  . Years of Education: N/A   Occupational History  . Not on file.   Social History Main Topics  . Smoking status: Never Smoker   . Smokeless tobacco: Never Used  . Alcohol Use: No  . Drug Use: No  . Sexual Activity: Not on file   Other Topics Concern  . Not on file   Social History Narrative   Current Outpatient Prescriptions on File Prior to Visit  Medication Sig Dispense Refill  . albuterol (PROVENTIL HFA;VENTOLIN HFA) 108 (90 BASE) MCG/ACT inhaler Inhale 108 mcg into the lungs as needed.    [name] allopurinol (ZYLOPRIM) 100 MG tablet Take 100 mg by mouth daily.    [name] amLODipine (NORVASC) 5 MG tablet Take 5 mg by mouth daily.  1  . atorvastatin (LIPITOR) 20 MG tablet Take 20 mg by mouth at bedtime.    [name] buPROPion (WELLBUTRIN) 75 MG tablet Take 75 mg by mouth 2 (two) times daily.  0  . DULoxetine (CYMBALTA) 60 MG capsule Take 120 mg by mouth daily.  0  . epoetin alfa (EPOGEN,PROCRIT) 4000 UNIT/ML injection Inject 0.55 mLs into the skin 3 (three) times a week.    . furosemide (LASIX) 40 MG tablet Take 40 mg by mouth 2 (two) times daily.    . furosemide (LASIX) 80 MG tablet Take 80 mg by mouth 3 (three) times a week.  0  . gabapentin (NEURONTIN) 100 MG capsule Take 100 mg by mouth daily.  1  . insulin aspart (NOVOLOG) 100 UNIT/ML FlexPen Inject 25 Units into the skin 3 (three) times daily - between meals and at bedtime.    [name] LANTUS SOLOSTAR 100 UNIT/ML Solostar Pen Inject 30 Units into the skin daily.   1  . lisinopril (PRINIVIL,ZESTRIL) 20 MG tablet Take 20 mg by mouth daily.    . magnesium oxide (MAG-OX)  400 MG tablet Take 400 mg by mouth daily.    . metoprolol (LOPRESSOR) 50 MG tablet Take 50 mg by mouth 2 (two) times daily.    . multivitamin (RENA-VIT) TABS tablet Take 1 tablet by mouth daily.    . nitroGLYCERIN (NITROSTAT) 0.4 MG SL tablet Place 0.4 mg under the tongue as needed.    . pantoprazole (PROTONIX) 40 MG tablet Take 40 mg by mouth daily.    . predniSONE (DELTASONE) 20 MG tablet Take 4 tablets (80  mg total) by mouth daily with breakfast. 170 tablet 0  . SENSIPAR 30 MG tablet Take 30 mg by mouth daily.    . sevelamer carbonate (RENVELA) 800 MG tablet Take 800 mg by mouth 3 (three) times daily.    . clonazePAM (KLONOPIN) 0.5 MG tablet Take 0.5 mg by mouth at bedtime as needed.     No current facility-administered medications on file prior to visit.   Allergies  Allergen Reactions  . Oxycodone-Acetaminophen Nausea Only     Review of Systems:  complains of  [  ] denies General:   [ x ] Recent weight change [  ] Fatigue  [  ] Loss of appetite Eyes: [  ]  Vision Difficulty [  ]  Eye pain ENT: [  ]  Hearing difficulty [  ]  Difficulty Swallowing CVS: [  ] Chest pain [  ]  Palpitations/Irregular Heart beat [x  ]  Shortness of breath lying flat [ x ] Swelling of legs Resp: [  ] Frequent Cough [ x ] Shortness of Breath  [ x ]  Wheezing GI: [ x ] Heartburn  [  ] Nausea or Vomiting  [  ] Diarrhea [  ] Constipation  [  ] Abdominal Pain GU: [  ]  Polyuria  [  ]  nocturia Bones/joints:  [  ]  Muscle aches  [  ] Joint Pain  [  ] Bone pain Skin/Hair/Nails: [  ]  Rash  [  ] New stretch marks [  ]  Itching [  ] Hair loss [  ]  Excessive hair growth Reproduction: [  ] Low sexual desire , [  ]  Women: Menstrual cycle problems [  ]  Women: Breast Discharge [  ] Men: Difficulty with erections [  ]  Men: Enlarged Breasts CNS: [  ] Frequent Headaches [  ] Blurry vision [  ] Tremors [  ] Seizures [  ] Loss of consciousness [  ] Localized weakness Endocrine: [  ]  Excess thirst [  ]  Feeling  excessively hot [  ]  Feeling excessively cold Heme: [  ]  Easy bruising [  ]  Enlarged glands or lumps in neck Allergy: [  ]  Food allergies [  ] Environmental allergies  PE: BP 138/76 mmHg  Pulse 76  Resp 16  Ht  (1.854 m)  Wt 305 lb 8 oz (138.574 kg)  BMI 40.31 kg/m2  SpO2 99% Wt Readings from Last 3 Encounters:  03/15/15 305 lb 8 oz (138.574 kg)  03/01/15 300 lb (136.079 kg)  02/27/15 260 lb (117.935 kg)   GENERAL: No acute distress, well developed HEENT:  Eye exam shows normal external appearance. Oral exam shows normal mucosa .  NECK:   Neck exam shows no lymphadenopathy. No Carotids bruits. Thyroid is not enlarged and no nodules felt.  + acanthosis nigricans LUNGS:         Chest is symmetrical. Lungs are clear to auscultation.[name]   HEART:         Heart sounds:  S1 and S2 are normal. No murmurs or clicks heard. ABDOMEN:  No Distention present. Liver and spleen are not palpable. No other mass or tenderness present.  EXTREMITIES:     There is 2+ edema. 2+ DP pulses  NEUROLOGICAL:     Grossly intact.            MUSCULOSKELETAL:       There is  no enlargement or gross deformity of the joints.  SKIN:       No rash  ASSESSMENT AND PLAN: Problem List Items Addressed This Visit      Endocrine   Type 1 diabetes mellitus - Primary    Discussed with the patient regarding basal/bolus therapy and expected response to high dose steroids.  Due to multiple comorbidities, would aim for less stringent A1c. Suspect that recent night time lows, were related to his corrective boluses with Novolog and I have asked him not to do this. Follow Novolog scale at meal times only.   Patient Instructions   Check sugars 4 x daily ( before each meal and at bedtime).  Record them in a log book and bring that/meter to next appointment.   Dont give additional units for Novolog at bedtime.  Lets try to restrict Novolog to meal times only.   Split lantus dosing as below. Follow Novolog chart as  below.    Blood Glucose (mg/dL)  Breakfast  (Units Novolog Insulin)  Lunch  (Units Novolog Insulin)  Supper  (Units Novolog Insulin)  Nighttime (Units Novolog Insulin)   <70 Treat the low blood sugar.  Recheck blood glucose  in 15 mins. If sugar is more than 70, then take the number of units of insulin in the 70-90 row, if before a meal.   70-90  12  12  12   0  91-130 (Base Dose)  20  20  20   0   131-150  21  21  21   0   151-200  22  22  22   0   201-250  23  23  23  0   251-300  24  24  24 1    301-350  25  25  25 2    351-400  26  26  26  3    401-450  27  27  27 4    >450  28  28  28  5    Lantus insulin 18 units morning and 10 units at night time units subcutaneous     Please come back for a follow-up appointment in 2 weeks Notify me sooner if FS are consistently over 250 or starting to go low.           Relevant Orders   Hemoglobin A1c (Completed)     Genitourinary   CKD (chronic kidney disease) stage V requiring chronic dialysis    Followed by nephrology. Dialysis schedule M/W/F        Other   Proliferative retinopathy    Has opthalmology follow up tomorrow.       Hyperlipidemia    Is on statin, ?managed by PCP/nephrology            - Return to clinic in 2 weeks with sugar log/meter.  Zandon Talton [facility] 03/15/2015 11:57 AM

## 2015-03-15 NOTE — Patient Instructions (Signed)
Check sugars 4 x daily ( before each meal and at bedtime).  Record them in a log book and bring that/meter to next appointment.   Dont give additional units for Novolog at bedtime.  Lets try to restrict Novolog to meal times only.   Split lantus dosing as below. Follow Novolog chart as below.    Blood Glucose (mg/dL)  Breakfast  (Units Novolog Insulin)  Lunch  (Units Novolog Insulin)  Supper  (Units Novolog Insulin)  Nighttime (Units Novolog Insulin)   <70 Treat the low blood sugar.  Recheck blood glucose  in 15 mins. If sugar is more than 70, then take the number of units of insulin in the 70-90 row, if before a meal.   70-90  12  12  12   0  91-130 (Base Dose)  20  20  20   0   131-150  21  21  21   0   151-200  22  22  22   0   201-250  23  23  23  0   251-300  24  24  24 1    301-350  25  25  25 2    351-400  26  26  26  3    401-450  27  27  27 4    >450  28  28  28  5    Lantus insulin 18 units morning and 10 units at night time units subcutaneous     Please come back for a follow-up appointment in 2 weeks Notify me sooner if FS are consistently over 250 or starting to go low.

## 2015-03-16 DIAGNOSIS — E10351 Type 1 diabetes mellitus with proliferative diabetic retinopathy with macular edema: Secondary | ICD-10-CM | POA: Diagnosis not present

## 2015-03-16 DIAGNOSIS — E13351 Other specified diabetes mellitus with proliferative diabetic retinopathy with macular edema: Secondary | ICD-10-CM | POA: Diagnosis not present

## 2015-03-18 ENCOUNTER — Encounter: Payer: Self-pay | Admitting: Emergency Medicine

## 2015-03-18 ENCOUNTER — Inpatient Hospital Stay
Admission: EM | Admit: 2015-03-18 | Discharge: 2015-03-21 | DRG: 291 | Disposition: A | Discharge status: Home / Self Care | Payer: BC Managed Care – PPO | Attending: Internal Medicine | Admitting: Internal Medicine

## 2015-03-18 ENCOUNTER — Emergency Department: Payer: BC Managed Care – PPO

## 2015-03-18 DIAGNOSIS — F419 Anxiety disorder, unspecified: Secondary | ICD-10-CM | POA: Diagnosis present

## 2015-03-18 DIAGNOSIS — J8489 Other specified interstitial pulmonary diseases: Secondary | ICD-10-CM | POA: Diagnosis present

## 2015-03-18 DIAGNOSIS — Z7952 Long term (current) use of systemic steroids: Secondary | ICD-10-CM

## 2015-03-18 DIAGNOSIS — I120 Hypertensive chronic kidney disease with stage 5 chronic kidney disease or end stage renal disease: Secondary | ICD-10-CM | POA: Diagnosis present

## 2015-03-18 DIAGNOSIS — J80 Acute respiratory distress syndrome: Secondary | ICD-10-CM | POA: Diagnosis present

## 2015-03-18 DIAGNOSIS — D638 Anemia in other chronic diseases classified elsewhere: Secondary | ICD-10-CM | POA: Diagnosis present

## 2015-03-18 DIAGNOSIS — I10 Essential (primary) hypertension: Secondary | ICD-10-CM | POA: Diagnosis present

## 2015-03-18 DIAGNOSIS — J9811 Atelectasis: Secondary | ICD-10-CM | POA: Diagnosis present

## 2015-03-18 DIAGNOSIS — I509 Heart failure, unspecified: Secondary | ICD-10-CM

## 2015-03-18 DIAGNOSIS — Z79899 Other long term (current) drug therapy: Secondary | ICD-10-CM | POA: Diagnosis not present

## 2015-03-18 DIAGNOSIS — N2581 Secondary hyperparathyroidism of renal origin: Secondary | ICD-10-CM | POA: Diagnosis present

## 2015-03-18 DIAGNOSIS — E039 Hypothyroidism, unspecified: Secondary | ICD-10-CM | POA: Diagnosis present

## 2015-03-18 DIAGNOSIS — D631 Anemia in chronic kidney disease: Secondary | ICD-10-CM | POA: Diagnosis present

## 2015-03-18 DIAGNOSIS — Z992 Dependence on renal dialysis: Secondary | ICD-10-CM

## 2015-03-18 DIAGNOSIS — R079 Chest pain, unspecified: Secondary | ICD-10-CM | POA: Diagnosis present

## 2015-03-18 DIAGNOSIS — E785 Hyperlipidemia, unspecified: Secondary | ICD-10-CM | POA: Diagnosis present

## 2015-03-18 DIAGNOSIS — G4733 Obstructive sleep apnea (adult) (pediatric): Secondary | ICD-10-CM | POA: Diagnosis present

## 2015-03-18 DIAGNOSIS — E1042 Type 1 diabetes mellitus with diabetic polyneuropathy: Secondary | ICD-10-CM | POA: Diagnosis present

## 2015-03-18 DIAGNOSIS — E10319 Type 1 diabetes mellitus with unspecified diabetic retinopathy without macular edema: Secondary | ICD-10-CM | POA: Diagnosis present

## 2015-03-18 DIAGNOSIS — Z794 Long term (current) use of insulin: Secondary | ICD-10-CM

## 2015-03-18 DIAGNOSIS — I5033 Acute on chronic diastolic (congestive) heart failure: Secondary | ICD-10-CM

## 2015-03-18 DIAGNOSIS — E559 Vitamin D deficiency, unspecified: Secondary | ICD-10-CM | POA: Diagnosis present

## 2015-03-18 DIAGNOSIS — K920 Hematemesis: Secondary | ICD-10-CM | POA: Diagnosis present

## 2015-03-18 DIAGNOSIS — N186 End stage renal disease: Secondary | ICD-10-CM | POA: Diagnosis present

## 2015-03-18 DIAGNOSIS — R0603 Acute respiratory distress: Secondary | ICD-10-CM

## 2015-03-18 DIAGNOSIS — M109 Gout, unspecified: Secondary | ICD-10-CM | POA: Diagnosis present

## 2015-03-18 DIAGNOSIS — T380X5A Adverse effect of glucocorticoids and synthetic analogues, initial encounter: Secondary | ICD-10-CM | POA: Diagnosis present

## 2015-03-18 DIAGNOSIS — J189 Pneumonia, unspecified organism: Secondary | ICD-10-CM | POA: Diagnosis present

## 2015-03-18 DIAGNOSIS — E875 Hyperkalemia: Secondary | ICD-10-CM | POA: Diagnosis present

## 2015-03-18 DIAGNOSIS — E103519 Type 1 diabetes mellitus with proliferative diabetic retinopathy with macular edema, unspecified eye: Secondary | ICD-10-CM

## 2015-03-18 DIAGNOSIS — E108 Type 1 diabetes mellitus with unspecified complications: Secondary | ICD-10-CM | POA: Diagnosis present

## 2015-03-18 DIAGNOSIS — Z833 Family history of diabetes mellitus: Secondary | ICD-10-CM | POA: Diagnosis not present

## 2015-03-18 DIAGNOSIS — D72829 Elevated white blood cell count, unspecified: Secondary | ICD-10-CM

## 2015-03-18 DIAGNOSIS — F329 Major depressive disorder, single episode, unspecified: Secondary | ICD-10-CM | POA: Diagnosis present

## 2015-03-18 DIAGNOSIS — Z6839 Body mass index (BMI) 39.0-39.9, adult: Secondary | ICD-10-CM

## 2015-03-18 DIAGNOSIS — Z8249 Family history of ischemic heart disease and other diseases of the circulatory system: Secondary | ICD-10-CM | POA: Diagnosis not present

## 2015-03-18 HISTORY — DX: Chronic diastolic (congestive) heart failure: I50.32

## 2015-03-18 HISTORY — DX: Secondary hyperparathyroidism of renal origin: N25.81

## 2015-03-18 HISTORY — DX: Pneumonia, unspecified organism: J18.9

## 2015-03-18 HISTORY — DX: Chest pain, unspecified: R07.9

## 2015-03-18 HISTORY — DX: Other specified interstitial pulmonary diseases: J84.89

## 2015-03-18 HISTORY — DX: Essential (primary) hypertension: I10

## 2015-03-18 HISTORY — DX: Anemia in other chronic diseases classified elsewhere: D63.8

## 2015-03-18 HISTORY — DX: Morbid (severe) obesity due to excess calories: E66.01

## 2015-03-18 HISTORY — DX: Type 1 diabetes mellitus without complications: E10.9

## 2015-03-18 LAB — CBC
HCT: 34.1 % — ABNORMAL LOW (ref 40.0–52.0)
HEMATOCRIT: 33.4 % — AB (ref 40.0–52.0)
HEMOGLOBIN: 10.5 g/dL — AB (ref 13.0–18.0)
HEMOGLOBIN: 11.1 g/dL — AB (ref 13.0–18.0)
MCH: 27.7 pg (ref 26.0–34.0)
MCH: 28.5 pg (ref 26.0–34.0)
MCHC: 31.6 g/dL — AB (ref 32.0–36.0)
MCHC: 32.7 g/dL (ref 32.0–36.0)
MCV: 87.7 fL (ref 80.0–100.0)
MCV: 87.4 fL (ref 80.0–100.0)
Platelets: 400 10*3/uL (ref 150–440)
Platelets: 389 10*3/uL (ref 150–440)
RBC: 3.8 MIL/uL — ABNORMAL LOW (ref 4.40–5.90)
RBC: 3.9 MIL/uL — ABNORMAL LOW (ref 4.40–5.90)
RDW: 21.3 % — AB (ref 11.5–14.5)
RDW: 21.5 % — AB (ref 11.5–14.5)
WBC: 17.1 10*3/uL — ABNORMAL HIGH (ref 3.8–10.6)
WBC: 19.7 10*3/uL — ABNORMAL HIGH (ref 3.8–10.6)

## 2015-03-18 LAB — BASIC METABOLIC PANEL
Anion gap: 14 (ref 5–15)
BUN: 90 mg/dL — AB (ref 6–20)
CO2: 28 mmol/L (ref 22–32)
CREATININE: 7.47 mg/dL — AB (ref 0.61–1.24)
Calcium: 7.7 mg/dL — ABNORMAL LOW (ref 8.9–10.3)
Chloride: 99 mmol/L — ABNORMAL LOW (ref 101–111)
GFR calc Af Amer: 9 mL/min — ABNORMAL LOW (ref >60)
GFR, EST NON AFRICAN AMERICAN: 8 mL/min — AB (ref >60)
GLUCOSE: 79 mg/dL (ref 65–99)
Potassium: 5.1 mmol/L (ref 3.5–5.1)
Sodium: 141 mmol/L (ref 135–145)

## 2015-03-18 LAB — GLUCOSE, CAPILLARY: Glucose-Capillary: 139 mg/dL — ABNORMAL HIGH (ref 65–99)

## 2015-03-18 LAB — TROPONIN I
Troponin I: 0.05 ng/mL — ABNORMAL HIGH (ref <0.031)
Troponin I: 0.03 ng/mL (ref <0.031)

## 2015-03-18 LAB — CREATININE, SERUM
Creatinine, Ser: 7.44 mg/dL — ABNORMAL HIGH (ref 0.61–1.24)
GFR calc Af Amer: 9 mL/min — ABNORMAL LOW (ref >60)
GFR calc non Af Amer: 8 mL/min — ABNORMAL LOW (ref >60)

## 2015-03-18 MED ORDER — ASPIRIN 81 MG PO CHEW
CHEWABLE_TABLET | ORAL | Status: AC
  Filled 2015-03-18: qty 1

## 2015-03-18 MED ORDER — AMLODIPINE BESYLATE 5 MG PO TABS
5.0000 mg | ORAL_TABLET | Freq: Every day | ORAL | Status: DC
  Administered 2015-03-19: 5 mg via ORAL
  Filled 2015-03-18: qty 1

## 2015-03-18 MED ORDER — INSULIN ASPART 100 UNIT/ML ~~LOC~~ SOLN
0.0000 [IU] | Freq: Every day | SUBCUTANEOUS | Status: DC
  Administered 2015-03-19: 5 [IU] via SUBCUTANEOUS
  Administered 2015-03-20: 3 [IU] via SUBCUTANEOUS
  Filled 2015-03-18: qty 5
  Filled 2015-03-18: qty 3

## 2015-03-18 MED ORDER — FUROSEMIDE 10 MG/ML IJ SOLN
40.0000 mg | Freq: Two times a day (BID) | INTRAMUSCULAR | Status: DC
  Administered 2015-03-19 – 2015-03-21 (×4): 40 mg via INTRAVENOUS
  Filled 2015-03-18 (×4): qty 4

## 2015-03-18 MED ORDER — FUROSEMIDE 10 MG/ML IJ SOLN
40.0000 mg | Freq: Once | INTRAMUSCULAR | Status: AC
  Administered 2015-03-18: 40 mg via INTRAVENOUS

## 2015-03-18 MED ORDER — CLONAZEPAM 0.5 MG PO TABS
0.5000 mg | ORAL_TABLET | Freq: Every evening | ORAL | Status: DC | PRN
  Administered 2015-03-18 – 2015-03-20 (×3): 0.5 mg via ORAL
  Filled 2015-03-18 (×3): qty 1

## 2015-03-18 MED ORDER — ACETAMINOPHEN 650 MG RE SUPP: 650.0000 mg | Freq: Four times a day (QID) | RECTAL | Status: DC | PRN

## 2015-03-18 MED ORDER — ONDANSETRON HCL 4 MG PO TABS: 4.0000 mg | ORAL_TABLET | Freq: Four times a day (QID) | ORAL | Status: DC | PRN

## 2015-03-18 MED ORDER — ALUM & MAG HYDROXIDE-SIMETH 200-200-20 MG/5ML PO SUSP: 30.0000 mL | Freq: Four times a day (QID) | ORAL | Status: DC | PRN

## 2015-03-18 MED ORDER — ALLOPURINOL 100 MG PO TABS
100.0000 mg | ORAL_TABLET | Freq: Every day | ORAL | Status: DC
  Administered 2015-03-18 – 2015-03-21 (×4): 100 mg via ORAL
  Filled 2015-03-18 (×4): qty 1

## 2015-03-18 MED ORDER — FUROSEMIDE 40 MG PO TABS
80.0000 mg | ORAL_TABLET | ORAL | Status: DC
  Administered 2015-03-18 – 2015-03-20 (×2): 80 mg via ORAL
  Filled 2015-03-18 (×2): qty 2

## 2015-03-18 MED ORDER — FUROSEMIDE 10 MG/ML IJ SOLN
120.0000 mg | Freq: Once | INTRAVENOUS | Status: AC
  Administered 2015-03-19: 120 mg via INTRAVENOUS
  Filled 2015-03-18: qty 12

## 2015-03-18 MED ORDER — NITROGLYCERIN 0.1 MG/HR TD PT24
0.1000 mg | MEDICATED_PATCH | Freq: Every day | TRANSDERMAL | Status: DC
  Administered 2015-03-18 – 2015-03-21 (×4): 0.1 mg via TRANSDERMAL
  Filled 2015-03-18 (×4): qty 1

## 2015-03-18 MED ORDER — GABAPENTIN 100 MG PO CAPS
100.0000 mg | ORAL_CAPSULE | Freq: Every day | ORAL | Status: DC
  Administered 2015-03-19 – 2015-03-21 (×3): 100 mg via ORAL
  Filled 2015-03-18 (×3): qty 1

## 2015-03-18 MED ORDER — FUROSEMIDE 10 MG/ML IJ SOLN
INTRAMUSCULAR | Status: AC
  Administered 2015-03-18: 40 mg via INTRAVENOUS
  Filled 2015-03-18: qty 4

## 2015-03-18 MED ORDER — DULOXETINE HCL 60 MG PO CPEP
120.0000 mg | ORAL_CAPSULE | Freq: Every day | ORAL | Status: DC
  Administered 2015-03-19 – 2015-03-21 (×3): 120 mg via ORAL
  Filled 2015-03-18 (×3): qty 2

## 2015-03-18 MED ORDER — ALBUTEROL SULFATE (2.5 MG/3ML) 0.083% IN NEBU
3.0000 mL | INHALATION_SOLUTION | RESPIRATORY_TRACT | Status: DC
  Administered 2015-03-18 – 2015-03-21 (×14): 3 mL via RESPIRATORY_TRACT
  Filled 2015-03-18 (×15): qty 3

## 2015-03-18 MED ORDER — SODIUM CHLORIDE 0.9 % IJ SOLN: 3.0000 mL | INTRAMUSCULAR | Status: DC | PRN

## 2015-03-18 MED ORDER — BUPROPION HCL 75 MG PO TABS
75.0000 mg | ORAL_TABLET | Freq: Two times a day (BID) | ORAL | Status: DC
  Administered 2015-03-18 – 2015-03-21 (×6): 75 mg via ORAL
  Filled 2015-03-18 (×7): qty 1

## 2015-03-18 MED ORDER — ACETAMINOPHEN 325 MG PO TABS
650.0000 mg | ORAL_TABLET | Freq: Four times a day (QID) | ORAL | Status: DC | PRN
  Administered 2015-03-20: 650 mg via ORAL
  Filled 2015-03-18: qty 2

## 2015-03-18 MED ORDER — ATORVASTATIN CALCIUM 20 MG PO TABS
20.0000 mg | ORAL_TABLET | Freq: Every day | ORAL | Status: DC
  Administered 2015-03-18 – 2015-03-20 (×3): 20 mg via ORAL
  Filled 2015-03-18 (×3): qty 1

## 2015-03-18 MED ORDER — PREDNISONE 20 MG PO TABS
80.0000 mg | ORAL_TABLET | Freq: Every day | ORAL | Status: DC
  Administered 2015-03-19: 80 mg via ORAL
  Filled 2015-03-18: qty 4

## 2015-03-18 MED ORDER — IPRATROPIUM-ALBUTEROL 0.5-2.5 (3) MG/3ML IN SOLN
3.0000 mL | Freq: Once | RESPIRATORY_TRACT | Status: AC
  Administered 2015-03-18: 3 mL via RESPIRATORY_TRACT

## 2015-03-18 MED ORDER — INSULIN GLARGINE 100 UNIT/ML ~~LOC~~ SOLN
18.0000 [IU] | Freq: Every morning | SUBCUTANEOUS | Status: DC
  Filled 2015-03-18 (×3): qty 0.18

## 2015-03-18 MED ORDER — IPRATROPIUM-ALBUTEROL 0.5-2.5 (3) MG/3ML IN SOLN
RESPIRATORY_TRACT | Status: AC
  Administered 2015-03-18: 3 mL via RESPIRATORY_TRACT
  Filled 2015-03-18: qty 3

## 2015-03-18 MED ORDER — INSULIN GLARGINE 100 UNIT/ML ~~LOC~~ SOLN
11.0000 [IU] | Freq: Every day | SUBCUTANEOUS | Status: DC
  Administered 2015-03-18 – 2015-03-19 (×2): 11 [IU] via SUBCUTANEOUS
  Filled 2015-03-18 (×3): qty 0.11

## 2015-03-18 MED ORDER — RENA-VITE PO TABS
1.0000 | ORAL_TABLET | Freq: Every day | ORAL | Status: DC
  Administered 2015-03-18 – 2015-03-21 (×4): 1 via ORAL
  Filled 2015-03-18 (×4): qty 1

## 2015-03-18 MED ORDER — HEPARIN SODIUM (PORCINE) 5000 UNIT/ML IJ SOLN
5000.0000 [IU] | Freq: Three times a day (TID) | INTRAMUSCULAR | Status: DC
  Administered 2015-03-18 – 2015-03-21 (×8): 5000 [IU] via SUBCUTANEOUS
  Filled 2015-03-18 (×8): qty 1

## 2015-03-18 MED ORDER — CINACALCET HCL 30 MG PO TABS
30.0000 mg | ORAL_TABLET | Freq: Every day | ORAL | Status: DC
  Administered 2015-03-19 – 2015-03-21 (×2): 30 mg via ORAL
  Filled 2015-03-18 (×2): qty 1

## 2015-03-18 MED ORDER — SEVELAMER CARBONATE 800 MG PO TABS
800.0000 mg | ORAL_TABLET | Freq: Three times a day (TID) | ORAL | Status: DC
  Administered 2015-03-19 – 2015-03-21 (×7): 800 mg via ORAL
  Filled 2015-03-18 (×7): qty 1

## 2015-03-18 MED ORDER — ASPIRIN 81 MG PO CHEW
81.0000 mg | CHEWABLE_TABLET | Freq: Every day | ORAL | Status: DC
  Administered 2015-03-18 – 2015-03-21 (×4): 81 mg via ORAL
  Filled 2015-03-18 (×3): qty 1

## 2015-03-18 MED ORDER — METOPROLOL TARTRATE 50 MG PO TABS
50.0000 mg | ORAL_TABLET | Freq: Two times a day (BID) | ORAL | Status: DC
  Administered 2015-03-18 – 2015-03-21 (×6): 50 mg via ORAL
  Filled 2015-03-18 (×6): qty 1

## 2015-03-18 MED ORDER — INSULIN GLARGINE 100 UNIT/ML SOLOSTAR PEN: 11.0000 [IU] | PEN_INJECTOR | SUBCUTANEOUS | Status: DC

## 2015-03-18 MED ORDER — ONDANSETRON HCL 4 MG/2ML IJ SOLN: 4.0000 mg | Freq: Four times a day (QID) | INTRAMUSCULAR | Status: DC | PRN

## 2015-03-18 MED ORDER — BISACODYL 5 MG PO TBEC: 5.0000 mg | DELAYED_RELEASE_TABLET | Freq: Every day | ORAL | Status: DC | PRN

## 2015-03-18 MED ORDER — INSULIN ASPART 100 UNIT/ML ~~LOC~~ SOLN
22.0000 [IU] | Freq: Three times a day (TID) | SUBCUTANEOUS | Status: DC
  Administered 2015-03-19 – 2015-03-21 (×4): 22 [IU] via SUBCUTANEOUS
  Filled 2015-03-18 (×3): qty 22

## 2015-03-18 MED ORDER — INSULIN ASPART 100 UNIT/ML ~~LOC~~ SOLN
0.0000 [IU] | Freq: Three times a day (TID) | SUBCUTANEOUS | Status: DC
  Administered 2015-03-19: 5 [IU] via SUBCUTANEOUS
  Administered 2015-03-19 – 2015-03-20 (×2): 3 [IU] via SUBCUTANEOUS
  Administered 2015-03-20: 9 [IU] via SUBCUTANEOUS
  Administered 2015-03-21: 3 [IU] via SUBCUTANEOUS
  Filled 2015-03-18: qty 3
  Filled 2015-03-18: qty 5
  Filled 2015-03-18 (×2): qty 3
  Filled 2015-03-18: qty 9

## 2015-03-18 MED ORDER — PANTOPRAZOLE SODIUM 40 MG PO TBEC
40.0000 mg | DELAYED_RELEASE_TABLET | Freq: Every day | ORAL | Status: DC
  Administered 2015-03-18 – 2015-03-21 (×4): 40 mg via ORAL
  Filled 2015-03-18 (×4): qty 1

## 2015-03-18 NOTE — ED Notes (Signed)
Pt with shortness of breath started today about 4 hrs ago. Pt on home o2.

## 2015-03-18 NOTE — ED Provider Notes (Signed)
[facility] Emergency Department Provider Note  ____________________________________________  Time seen: 1630  I have reviewed the triage vital signs and the nursing notes.   HISTORY  Chief Complaint Shortness of Breath    HPI Ryan Arnold is a 54 y.o. male who has a history of congestive heart failure, renal failure with dialysis, and bronchiolitis obliterans organizing pneumonia. He had a biopsy forBOOP approximately one month ago.   The patient presents now with acute shortness of breath. He does use oxygen at home. He reports that he is needing to use more of it over the past 2-3 hours and he is in mild distress. He denies any chest pain. This isn't contrary to the nurse's note that reported right-sided chest pain. He denies any nausea vomiting or diaphoresis.    Past Medical History  Diagnosis Date  . 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 Hypertension   . Hyperlipemia   . End stage renal disease   . Chronic kidney disease (CKD), dialysis modality undecided     Patient Active Problem List   Diagnosis Date Noted  . Type 1 diabetes mellitus 03/15/2015  . Proliferative retinopathy 03/15/2015  . CKD (chronic kidney disease) stage V requiring chronic dialysis 03/15/2015  . Hyperlipidemia 03/15/2015  . BOOP (bronchiolitis obliterans with organizing pneumonia) 03/01/2015  . Dyspnea 01/18/2015  . OSA (obstructive sleep apnea) 01/18/2015  . Recurrent pneumonia 01/15/2015    Past Surgical History  Procedure Laterality Date  . Cataract    . Shoulder surgery Left   . Knee surgery Right   . Gallbladder surgery    . Lung biopsy      Current Outpatient Rx  Name  Route  Sig  Dispense  Refill  . albuterol (PROVENTIL HFA;VENTOLIN HFA) 108 (90 BASE) MCG/ACT inhaler   Inhalation   Inhale 108 mcg into the lungs as needed.         [name]. allopurinol (ZYLOPRIM) 100 MG tablet   Oral   Take 100 mg by mouth daily.         [name].  amLODipine (NORVASC) 5 MG tablet   Oral   Take 5 mg by mouth daily.      1   . atorvastatin (LIPITOR) 20 MG tablet   Oral   Take 20 mg by mouth at bedtime.         [name]. buPROPion (WELLBUTRIN) 75 MG tablet   Oral   Take 75 mg by mouth 2 (two) times daily.      0   . EXPIRED: clonazePAM (KLONOPIN) 0.5 MG tablet   Oral   Take 0.5 mg by mouth at bedtime as needed.         . DULoxetine (CYMBALTA) 60 MG capsule   Oral   Take 120 mg by mouth daily.      0   . epoetin alfa (EPOGEN,PROCRIT) 4000 UNIT/ML injection   Subcutaneous   Inject 0.55 mLs into the skin 3 (three) times a week.         . furosemide (LASIX) 40 MG tablet   Oral   Take 40 mg by mouth 2 (two) times daily.         . furosemide (LASIX) 80 MG tablet   Oral   Take 80 mg by mouth 3 (three) times a week.      0   . gabapentin (NEURONTIN) 100 MG capsule   Oral   Take 100 mg by mouth daily.  1   . insulin aspart (NOVOLOG) 100 UNIT/ML FlexPen   Subcutaneous   Inject 25 Units into the skin 3 (three) times daily - between meals and at bedtime.         [name] LANTUS SOLOSTAR 100 UNIT/ML Solostar Pen   Subcutaneous   Inject 30 Units into the skin daily.       1     Dispense as written.   [name] lisinopril (PRINIVIL,ZESTRIL) 20 MG tablet   Oral   Take 20 mg by mouth daily.         . magnesium oxide (MAG-OX) 400 MG tablet   Oral   Take 400 mg by mouth daily.         . metoprolol (LOPRESSOR) 50 MG tablet   Oral   Take 50 mg by mouth 2 (two) times daily.         . multivitamin (RENA-VIT) TABS tablet   Oral   Take 1 tablet by mouth daily.         . nitroGLYCERIN (NITROSTAT) 0.4 MG SL tablet   Sublingual   Place 0.4 mg under the tongue as needed.         . pantoprazole (PROTONIX) 40 MG tablet   Oral   Take 40 mg by mouth daily.         . predniSONE (DELTASONE) 20 MG tablet   Oral   Take 4 tablets (80 mg total) by mouth daily with breakfast.   170 tablet   0   . SENSIPAR 30 MG  tablet   Oral   Take 30 mg by mouth daily.           Dispense as written.   . sevelamer carbonate (RENVELA) 800 MG tablet   Oral   Take 800 mg by mouth 3 (three) times daily.           Allergies Oxycodone-acetaminophen  Family History  Problem Relation Age of Onset  . Diabetes Mother   . Hypertension Mother     Social History History  Substance Use Topics  . Smoking status: Never Smoker   . Smokeless tobacco: Never Used  . Alcohol Use: No    Review of Systems  Constitutional: Negative for fever. ENT: Negative for sore throat. Cardiovascular: Negative for chest pain. Respiratory: Positive for shortness of breath. See history of present illness Gastrointestinal: Negative for abdominal pain, vomiting and diarrhea. Genitourinary: Negative for dysuria. Renal: Incisional disease Musculoskeletal: Negative for back pain. Skin: Negative for rash. Neurological: Negative for headaches   10-point ROS otherwise negative.  ____________________________________________   PHYSICAL EXAM:  VITAL SIGNS: ED Triage Vitals  Enc Vitals Group     BP 03/18/15 1615 161/76 mmHg     Pulse Rate 03/18/15 1615 79     Resp 03/18/15 1615 22     Temp 03/18/15 1615 98.1 F (36.7 C)     Temp Source 03/18/15 1615 Oral     SpO2 03/18/15 1615 98 %     Weight 03/18/15 1615 305 lb (138.347 kg)     Height 03/18/15 1615  (1.854 m)     Head Cir --      Peak Flow --      Pain Score 03/18/15 1616 0     Pain Loc --      Pain Edu? --      Excl. in GC? --     Constitutional:  Alert and oriented. Mild distress with 5-6 word dyspnea. ENT   Head:  Normocephalic and atraumatic.   Nose: No congestion/rhinnorhea.   Mouth/Throat: Mucous membranes are moist. Cardiovascular: Normal rate, regular rhythm. Respiratory: Mild to almost moderate increased work of breathing. The patient looks slightly uncomfortable. He is communicative. The breath sounds are little coarse, worse on the  lower left. Gastrointestinal: Soft and nontender. No distention.  Back: No muscle spasm, no tenderness, no CVA tenderness. Musculoskeletal: Nontender with normal range of motion in all extremities.  Patient does have notable peripheral edema approximately 3+. Neurologic:  Normal speech and language. No gross focal neurologic deficits are appreciated.  Skin:  Skin is warm, dry. No rash noted. Psychiatric: Mood and affect are normal. Speech and behavior are normal.  ____________________________________________    LABS (pertinent positives/negatives)  White blood cell count 17, hemoglobin 10.5 Sodium 141 potassium 5.1, BUN of 90, creatinine of 7.47. Troponin 0.05  ____________________________________________   EKG  ED ECG REPORT I, Kaho Selle W, the attending physician, personally viewed and interpreted this ECG.   Date: 03/18/2015  EKG Time: 1637  Rate: 76  Rhythm: Normal sinus rhythm  Axis: -24 left axis  Intervals: Normal intervals  ST&T Change: Flat T in lead 3, somewhat peaked T waves in lead V3 through V5   ____________________________________________    RADIOLOGY  Chest x-ray: IMPRESSION: CHF with a right effusion and overlying atelectasis.  ____________________________________________   PROCEDURES  INITIAL IMPRESSION / ASSESSMENT AND PLAN / ED COURSE  Patient with a history of BOOP and CHF. Currently having moderate difficulty breathing. Treated him with Lasix, 40 mg IV, as he does make urine and takes Lasix at home as well. I also treated him with a DuoNeb to maximize his pulmonary function.  ----------------------------------------- 6:20 PM on 03/18/2015 -----------------------------------------  Reexam of the patient shows he still appears to uncomfortable and tachypnea. His chest x-ray does show some edema. We are not having any benefit so far from the 40 mg of Lasix. Patient does report he feels somewhat better though.   I will discuss admission  with the hospitalist team. I think it's best given the patient's degree of respiratory symptoms and complicated medical history with CHF, BOOP,  and end-stage renal disease to have him admitted and observed overnight.  ____________________________________________   FINAL CLINICAL IMPRESSION(S) / ED DIAGNOSES  Final diagnoses:  Acute respiratory distress  Acute on chronic congestive heart failure, unspecified congestive heart failure type  CKD (chronic kidney disease) stage V requiring chronic dialysis      Darien Ramus, MD 03/18/15 [phone]

## 2015-03-18 NOTE — H&P (Addendum)
[facility] - [place] at [facility]   PATIENT NAME: Ryan Arnold    MR#:  [id number]  DATE OF BIRTH:  October 16, 1960  DATE OF ADMISSION:  03/18/2015  PRIMARY CARE PHYSICIAN: Stephanie Acre, MD   REQUESTING/REFERRING PHYSICIAN: Dr. Carollee Massed  CHIEF COMPLAINT:   Shortness of breath HISTORY OF PRESENT ILLNESS:  Ryan Arnold  is a 54 y.o. male with a known history of diastolic heart failure,BOOP and end-stage renal disease on hemodialysis Monday, Wednesday and Friday who presents with above complaint. Over the past day patient's had increasing shortness of breath, dyspnea exertion and lower extremity edema. Infectious as his lower extremity edema has been persistent for the past several months. He also notices increasing abdominal girth. He had dialysis on Friday which is his normal scheduled day. Since he's been in the emergency department he is also complaining of chest tightness on the left side. He denies any alleviating or relieving factors. The chest pain is on and off. It lasts only seconds. On a scale of 1-10 it is 1 out of 10. He still makes urine, therefore he has received Lasix IV in the emergency department.  PAST MEDICAL HISTORY:   Past Medical History  Diagnosis Date  . 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 Hypertension   . Hyperlipemia   . End stage renal disease   . Chronic kidney disease (CKD), dialysis modality undecided    diastolic heart failure his last echocardiogram was in 2015 which showed normal ejection fraction with LVH. Obstructive sleep apnea on CPAP He wears oxygen when necessary.  PAST SURGICAL HISTORY:   Past Surgical History  Procedure Laterality Date  . Cataract    . Shoulder surgery Left   . Knee surgery Right   . Gallbladder surgery    . Lung biopsy      SOCIAL HISTORY:   History  Substance Use Topics  . Smoking status: Never Smoker   . Smokeless tobacco: Never Used  . Alcohol Use: No     FAMILY HISTORY:   Family History  Problem Relation Age of Onset  . Diabetes Mother   . Hypertension Mother     DRUG ALLERGIES:   Allergies  Allergen Reactions  . Oxycodone-Acetaminophen Nausea And Vomiting     REVIEW OF SYSTEMS:  CONSTITUTIONAL: No fever, fatigue or weakness.  EYES: No blurred or double vision.  EARS, NOSE, AND THROAT: No tinnitus or ear pain.  RESPIRATORY: Positive for shortness of breath and dyspnea exertion no wheezing or hemoptysis  CARDIOVASCULAR: He has chest tightness on the left side without radiation he has chronic lower extremity edema bilaterally GASTROINTESTINAL: No nausea, vomiting, diarrhea or abdominal pain.  GENITOURINARY: No dysuria, hematuria.  ENDOCRINE: No polyuria, nocturia,  HEMATOLOGY: No anemia, easy bruising or bleeding SKIN: No rash or lesion. MUSCULOSKELETAL: No joint pain or arthritis.   NEUROLOGIC: No tingling, numbness, weakness.  PSYCHIATRY: No anxiety or depression.   MEDICATIONS AT HOME:   Prior to Admission medications   Medication Sig Start Date End Date Taking? Authorizing Provider  allopurinol (ZYLOPRIM) 100 MG tablet Take 100 mg by mouth daily. 09/11/14  Yes Historical Provider, MD  amLODipine (NORVASC) 5 MG tablet Take 5 mg by mouth daily. 01/13/15  Yes Historical Provider, MD  atorvastatin (LIPITOR) 20 MG tablet Take 20 mg by mouth at bedtime. 08/17/14  Yes Historical Provider, MD  buPROPion (WELLBUTRIN) 75 MG tablet Take 75 mg by mouth 2 (two) times daily. 01/02/15  Yes Historical Provider, MD  clonazePAM (KLONOPIN) 0.5 MG tablet Take 0.5 mg by mouth at bedtime as needed. 01/02/15 03/18/15 Yes Historical Provider, MD  DULoxetine (CYMBALTA) 60 MG capsule Take 120 mg by mouth daily. 01/02/15  Yes Historical Provider, MD  furosemide (LASIX) 40 MG tablet Take 40 mg by mouth 2 (two) times daily. 01/12/15  Yes Historical Provider, MD  furosemide (LASIX) 80 MG tablet Take 80 mg by mouth 4 (four) times a week. Tuesday, Thursday,  Saturday, and Sunday. 12/18/14  Yes Historical Provider, MD  gabapentin (NEURONTIN) 100 MG capsule Take 100 mg by mouth daily. 12/26/14  Yes Historical Provider, MD  insulin aspart (NOVOLOG) 100 UNIT/ML FlexPen Inject 22 Units into the skin 3 (three) times daily with meals.  05/25/14  Yes Historical Provider, MD  LANTUS SOLOSTAR 100 UNIT/ML Solostar Pen Inject 11-18 Units into the skin See admin instructions. Inject 18 units subcutaneous every morning and Inject 11 units subcutaneous every night at bedtime. 12/18/14  Yes Historical Provider, MD  metoprolol (LOPRESSOR) 50 MG tablet Take 50 mg by mouth 2 (two) times daily. 12/23/14  Yes Historical Provider, MD  multivitamin (RENA-VIT) TABS tablet Take 1 tablet by mouth daily. 01/09/14  Yes Historical Provider, MD  predniSONE (DELTASONE) 20 MG tablet Take 4 tablets (80 mg total) by mouth daily with breakfast. 03/01/15  Yes [name] Mungal, MD  SENSIPAR 30 MG tablet Take 30 mg by mouth daily. 12/18/14  Yes Historical Provider, MD  sevelamer carbonate (RENVELA) 800 MG tablet Take 800 mg by mouth 3 (three) times daily. 03/29/14  Yes Historical Provider, MD      VITAL SIGNS:  Blood pressure 156/80, pulse 72, temperature 98.1 F (36.7 C), temperature source Oral, resp. rate 23, height 6\' 1"  (1.854 m), weight 138.347 kg (305 lb), SpO2 97 %.  PHYSICAL EXAMINATION:  GENERAL:  54 y.o.-year-old overweight patient lying in the bed with no acute distress.  EYES: Pupils equal, round, reactive to light and accommodation. No scleral icterus. Extraocular muscles intact.  HEENT: Head atraumatic, normocephalic. Oropharynx and nasopharynx clear.  NECK:  Supple, positive jugular venous distention. No thyroid enlargement, no tenderness.  LUNGS: He has crackles at the bases. No increased risk for effort. No rales or rhonchi are heard. CARDIOVASCULAR: Distant heart sounds with normal S1 and S2 No murmurs, rubs, or gallops.  ABDOMEN: Obese ,Soft, nontender,  Bowel sounds present.  Hard to appreciate organomegaly due to body habitus EXTREMITIES: 2+ pedal edema bilaterally to sinuses or clubbing NEUROLOGIC: Cranial nerves II through XII are intact. Muscle strength 5/5 in all extremities. Sensation intact. Gait not checked.  PSYCHIATRIC: The patient is alert and oriented x 3.  SKIN: No obvious rash, lesion, or ulcer.   LABORATORY PANEL:   CBC  Recent Labs Lab 03/18/15 1638  WBC 17.1*  HGB 10.5*  HCT 33.4*  PLT 400   ------------------------------------------------------------------------------------------------------------------  Chemistries   Recent Labs Lab 03/18/15 1638  NA 141  K 5.1  CL 99*  CO2 28  GLUCOSE 79  BUN 90*  CREATININE 7.47*  CALCIUM 7.7*   ------------------------------------------------------------------------------------------------------------------  Cardiac Enzymes  Recent Labs Lab 03/18/15 1638  TROPONINI 0.05*   ------------------------------------------------------------------------------------------------------------------  RADIOLOGY:  Dg Chest 1 View  03/18/2015    IMPRESSION: CHF with a right effusion and overlying atelectasis.   Electronically Signed   By: Rudie MeyerP.  Gallerani M.D.   On: 03/18/2015 16:57    EKG:  Normal sinus rhythm heart rate 76 no ST elevation or depression  IMPRESSION AND PLAN:  This is a very pleasant  54 year old male with a history of diastolic heart failure, End-stage renal disease on hemodialysis and diabetes who presents with CHF exacerbation and chest tightness.  1. Acute on chronic diastolic heart failure: Patient's chest x-rays consistent with congestive heart failure as are his symptoms. His last echocardiogram was in 2015 which showed a normal ejection fraction and diastolic dysfunction. Patient still makes urine. I will order Lasix 40 IV every 12 hours. We will will continue to monitor his I's and O's and daily weight.. I will continue with his outpatient medications including metoprolol  and lisinopril. I have also ordered nitroglycerin.  2. End-stage renal disease and hematemesis: I have consulted nephrology. Patient will require dialysis tomorrow. He will continue Renvela.  3. Juvenile diabetes: Patient is currently on insulin which I will continue. His hemoglobin A1c was performed a few days ago which was 7.0. He will be on ADA diet and sliding scale insulin.  4. Obstructive sleep apnea on CPAP machine: I have written for CPAP machine. He is also on oxygen when necessary.  5. BOOP: Patient follows with Dr. Dema Severin. At this time no signs of exacerbation.  6. Chest pain: Patient has a slightly elevated troponin and is complaining of chest tightness. His chest tightness may be related to his CHF exacerbation, however cannot completely rule out acute coronary syndrome. Patient will have serial troponin levels. I will continue atorvastatin, metoprolol and lisinopril. I have also ordered aspirin and nitroglycerin He has consented for anticoagulation if needed. I have reviewed the side effects, alternatives and risks of anticoagulation.  7. Leukocytosis: Of unclear etiology. I will repeat a CBC in a.m. I see no overt signs of infection.  All the records are reviewed and case discussed with ED provider. Management plans discussed with the patient and he is in agreement.  CODE STATUS: FULL  TOTAL TIME TAKING CARE OF THIS PATIENT: 45 minutes.    Nahome Bublitz M.D on 03/18/2015 at 7:14 PM  Between 7am to 6pm - Pager - [phone] After 6pm go to [email or web address] - password [email or web address]  [facility]  Office  [phone]  CC: Primary care physician; Stephanie Acre, MD

## 2015-03-18 NOTE — ED Notes (Signed)
Pt hs sob.  Sx began today.  Pt had dialysis 2 days ago. No chest pain.  No n/v/d.  No diaphoresis.  Pt had lung biopsy 1 month ago.  Pt has bilateral wheezing.  md at bedside.  Iv started stat.  Family with pt.

## 2015-03-19 ENCOUNTER — Inpatient Hospital Stay: Payer: BC Managed Care – PPO

## 2015-03-19 ENCOUNTER — Encounter: Payer: Self-pay | Admitting: Nurse Practitioner

## 2015-03-19 DIAGNOSIS — E1065 Type 1 diabetes mellitus with hyperglycemia: Secondary | ICD-10-CM | POA: Insufficient documentation

## 2015-03-19 DIAGNOSIS — N2581 Secondary hyperparathyroidism of renal origin: Secondary | ICD-10-CM | POA: Diagnosis present

## 2015-03-19 DIAGNOSIS — N186 End stage renal disease: Secondary | ICD-10-CM | POA: Diagnosis present

## 2015-03-19 DIAGNOSIS — I5032 Chronic diastolic (congestive) heart failure: Secondary | ICD-10-CM | POA: Insufficient documentation

## 2015-03-19 DIAGNOSIS — E1069 Type 1 diabetes mellitus with other specified complication: Secondary | ICD-10-CM

## 2015-03-19 DIAGNOSIS — I10 Essential (primary) hypertension: Secondary | ICD-10-CM | POA: Diagnosis present

## 2015-03-19 DIAGNOSIS — I5033 Acute on chronic diastolic (congestive) heart failure: Principal | ICD-10-CM

## 2015-03-19 DIAGNOSIS — R079 Chest pain, unspecified: Secondary | ICD-10-CM | POA: Diagnosis present

## 2015-03-19 DIAGNOSIS — E875 Hyperkalemia: Secondary | ICD-10-CM

## 2015-03-19 DIAGNOSIS — D638 Anemia in other chronic diseases classified elsewhere: Secondary | ICD-10-CM | POA: Diagnosis present

## 2015-03-19 DIAGNOSIS — E785 Hyperlipidemia, unspecified: Secondary | ICD-10-CM | POA: Diagnosis present

## 2015-03-19 LAB — CBC
HCT: 36.8 % — ABNORMAL LOW (ref 40.0–52.0)
Hemoglobin: 11.8 g/dL — ABNORMAL LOW (ref 13.0–18.0)
MCH: 27.9 pg (ref 26.0–34.0)
MCHC: 31.9 g/dL — ABNORMAL LOW (ref 32.0–36.0)
MCV: 87.4 fL (ref 80.0–100.0)
Platelets: 413 10*3/uL (ref 150–440)
RBC: 4.21 MIL/uL — ABNORMAL LOW (ref 4.40–5.90)
RDW: 21.2 % — AB (ref 11.5–14.5)
WBC: 22.5 10*3/uL — ABNORMAL HIGH (ref 3.8–10.6)

## 2015-03-19 LAB — URINALYSIS COMPLETE WITH MICROSCOPIC (ARMC ONLY)
BACTERIA UA: NONE SEEN
BILIRUBIN URINE: NEGATIVE
Hgb urine dipstick: NEGATIVE
Ketones, ur: NEGATIVE mg/dL
Leukocytes, UA: NEGATIVE
NITRITE: NEGATIVE
Specific Gravity, Urine: 1.008 (ref 1.005–1.030)
pH: 8 (ref 5.0–8.0)

## 2015-03-19 LAB — BASIC METABOLIC PANEL
Anion gap: 16 — ABNORMAL HIGH (ref 5–15)
BUN: 97 mg/dL — AB (ref 6–20)
CALCIUM: 7.7 mg/dL — AB (ref 8.9–10.3)
CO2: 24 mmol/L (ref 22–32)
Chloride: 98 mmol/L — ABNORMAL LOW (ref 101–111)
Creatinine, Ser: 7.65 mg/dL — ABNORMAL HIGH (ref 0.61–1.24)
GFR calc Af Amer: 8 mL/min — ABNORMAL LOW (ref >60)
GFR calc non Af Amer: 7 mL/min — ABNORMAL LOW (ref >60)
Glucose, Bld: 292 mg/dL — ABNORMAL HIGH (ref 65–99)
POTASSIUM: 6.1 mmol/L — AB (ref 3.5–5.1)
Sodium: 138 mmol/L (ref 135–145)

## 2015-03-19 LAB — GLUCOSE, CAPILLARY
Glucose-Capillary: 288 mg/dL — ABNORMAL HIGH (ref 65–99)
Glucose-Capillary: 70 mg/dL (ref 65–99)
Glucose-Capillary: 206 mg/dL — ABNORMAL HIGH (ref 65–99)
Glucose-Capillary: 412 mg/dL — ABNORMAL HIGH (ref 65–99)

## 2015-03-19 LAB — LIPID PANEL
Cholesterol: 284 mg/dL — ABNORMAL HIGH (ref 0–200)
TRIGLYCERIDES: 78 mg/dL (ref <150)
VLDL: 16 mg/dL (ref 0–40)

## 2015-03-19 LAB — TROPONIN I: TROPONIN I: 0.05 ng/mL — AB (ref <0.031)

## 2015-03-19 MED ORDER — SODIUM POLYSTYRENE SULFONATE 15 GM/60ML PO SUSP: 30.0000 g | Freq: Once | ORAL | Status: DC

## 2015-03-19 MED ORDER — VANCOMYCIN HCL IN DEXTROSE 1-5 GM/200ML-% IV SOLN
1000.0000 mg | Freq: Once | INTRAVENOUS | Status: AC
  Administered 2015-03-19: 1000 mg via INTRAVENOUS
  Filled 2015-03-19 (×2): qty 200

## 2015-03-19 MED ORDER — HEPARIN SODIUM (PORCINE) 1000 UNIT/ML IJ SOLN
50.0000 [IU]/kg | INTRAMUSCULAR | Status: DC | PRN
  Administered 2015-03-19: 7100 [IU]
  Filled 2015-03-19 (×2): qty 7.1

## 2015-03-19 MED ORDER — LEVOFLOXACIN 500 MG PO TABS
500.0000 mg | ORAL_TABLET | ORAL | Status: DC
  Administered 2015-03-19: 500 mg via ORAL
  Filled 2015-03-19: qty 1

## 2015-03-19 MED ORDER — AMLODIPINE BESYLATE 10 MG PO TABS
10.0000 mg | ORAL_TABLET | Freq: Every day | ORAL | Status: DC
  Administered 2015-03-20 – 2015-03-21 (×2): 10 mg via ORAL
  Filled 2015-03-19 (×2): qty 1

## 2015-03-19 MED ORDER — LEVOFLOXACIN 500 MG PO TABS: 500.0000 mg | ORAL_TABLET | Freq: Every day | ORAL | Status: DC

## 2015-03-19 NOTE — Progress Notes (Signed)
Initial Nutrition Assessment  DOCUMENTATION CODES:     INTERVENTION:   (Meals and snacks: Cater to pt prefences)  NUTRITION DIAGNOSIS:  Inadequate oral intake related to other (see comment) (poor po intake) as evidenced by per patient/family report.    GOAL:  Patient will meet greater than or equal to 90% of their needs    MONITOR:   (Energy intake, Electrolyte and renal profile, Anthropometric)  REASON FOR ASSESSMENT:  Other (Comment) (dialysis )    ASSESSMENT:  Pt admitted with shortness of breath, CHF, pulmonary edema, hyperkalmeia  Past Medical History  Diagnosis Date  . Essential hypertension   . Hyperlipemia   . End stage renal disease     a. on dialysis; still makes urine.  [name]. BOOP (bronchiolitis obliterans with organizing pneumonia)     a. 01/2018 s/p wedge resection/Bx confirming BOOP;  b. 02/2018 high dose steroids started.  . Chronic diastolic CHF (congestive heart failure)     a. 12/2014 Echo: EF 50-55%, mild conc LVH, mildly dil LA.  . Morbid obesity   . Secondary hyperparathyroidism   . Anemia of chronic disease   . Recurrent pneumonia     a. 2/2 BOOP.  [name]. Chest pain   . Type 1 diabetes mellitus 03/15/2015   Pt reports ate few bites of lunch today, tray observed.   Prior to admission good appetite, eating 3 meals per day  Electrolyte and Renal Profile:  Recent Labs Lab 03/18/15 1638 03/18/15 2019 03/19/15 0127  BUN 90*  --  97*  CREATININE 7.47* 7.44* 7.65*  NA 141  --  138  K 5.1  --  6.1*    Medications: renvela, sensipar, lasix, MVI, aspart  Height:  Ht Readings from Last 1 Encounters:  03/18/15 6\' 1"  (1.854 m)    Weight: Pt reports stable weight recently  Wt Readings from Last 1 Encounters:  03/19/15 298 lb 15.1 oz (135.6 kg)   Estimated energy needs:  Using IBW of 84kg BEE 1748 kcals (IF 1.0-1.3, AF 1.3) [id number] kcals/d.   Protein: (1.2-1.5 g/d) Using IBW of 84kg 101-126 g/d  Fluid (1000ml + urine  output)    BMI:  Body mass index is 39.45 kg/(m^2).   (1000ml + output)   Skin:  Reviewed, no issues  Diet Order:  Diet renal/carb modified with fluid restriction Diet-HS Snack?: Nothing; Room service appropriate?: Yes; Fluid consistency:: Thin  EDUCATION NEEDS:  No education needs identified at this time   Intake/Output Summary (Last 24 hours) at 03/19/15 1540 Last data filed at 03/19/15 1300  Gross per 24 hour  Intake      0 ml  Output  -2933 ml  Net   2933 ml    Last BM:  6/05  MODERATE Care Level  Ellesse Antenucci B. Freida BusmanAllen, RD, LDN [phone])

## 2015-03-19 NOTE — Progress Notes (Signed)
Patient is alert and oriented this shift. Denied any pain. Still on CPAP and his sat fluctuates between 95-99%. Resting comfortably at this time with his eyes closed, respirations even and unlabored.

## 2015-03-19 NOTE — Consult Note (Signed)
CARDIOLOGY CONSULT NOTE   Patient ID: Ryan FellsMyron W Arnold MRN: [id number], DOB/AGE: 52/04/1961   Admit date: 03/18/2015 Date of Consult: 03/19/2015   Primary Physician: [name],[name], MD  Primary Cardiologist: new - seen by M. Kirke CorinArida, MD   Pt. Profile  54 y/o male w/ a h/o ESRD, BOOP/dyspnea/recurrent pna, and diastolic CHF, who was admitted on the evening of 6/5 with dyspnea and volume overload.  Problem List  Past Medical History  Diagnosis Date  . Essential hypertension   . Hyperlipemia   . End stage renal disease     a. on dialysis; still makes urine.  [name]. BOOP (bronchiolitis obliterans with organizing pneumonia)     a. 01/2018 s/p R thoracoscopy/Bx confirming BOOP;  b. 02/2018 high dose steroids started.  . Chronic diastolic CHF (congestive heart failure)     a. 12/2014 Echo: EF 50-55%, mild conc LVH, mildly dil LA.  . Morbid obesity   . Secondary hyperparathyroidism   . Anemia of chronic disease   . Recurrent pneumonia     a. 2/2 BOOP.  [name]. Chest pain     a. 06/2014 Myoview ([facility]) no ischemia/infarct, nl EF.  . Type 1 diabetes mellitus     Past Surgical History  Procedure Laterality Date  . Cataract    . Shoulder surgery Left   . Knee surgery Right   . Gallbladder surgery    . Lung biopsy      Allergies  Allergies  Allergen Reactions  . Oxycodone-Acetaminophen Nausea And Vomiting   HPI   54 y/o male with the above complex problem list.  He has a h/o ESRD and is on MWF dialysis locally.  He also has a h/o recurrent PNA and earlier this year, was Dx with BOOP following R thoracoscopy and biopsy.  He is now followed closely by [facility] and has been on high dose steroids for BOOP since May.  He is very sedentary at baseline and reports a h/o DOE and chronic bilat lower ext edema.  He has had c/p in the past, most often occurring with emotional stress, and was evaluated @ [facility] in 06/2014 with a lexiscan myoview, which was negative for ischemia or infarct.  More  recently, an echo [facility] and showed nl LV fxn w/ mild concentric LVH in 12/2014.    Since being placed on steroids, he has noted an increase in abdominal bloating and lower extremity edema.  On his non-dialysis days, he feels that fluid is more quickly accumulating than what it used to, and over the past few weekends, he has noted dyspnea by Sunday PM as well.  On Friday 6/3, he underwent dialysis as usual.  He had to leave 45 mins early and so he was dialyzed down to 135 kg, instead of his usual dry weight of 134 kg.  He felt well Friday evening and Saturday.  He ate at the [employer] [place] on Saturday night.  He says that this is not uncommon for him.  By Sunday morning, he noted significant abdominal bloating, dyspnea, and wheezing.  He presented [facility] ED on the afternoon of 6/5, and was found to be markedly volume overloaded on exam.  CXR showed CHF and a right sided pleural effusion.  Potassium was mildly elevated @ 5.1 in the setting of a Creat of 7.47.  He was treated with lasix in the ED (still makes urine) with good response and symptomatic improvement.  He was admitted and placed on abx  in the setting of BOOP and leukocytosis (WBC 17.1  19.7  22.5).  He has been afebrile.  Wt was up 10 kg on admission (145 kg).  While in the ED, he developed mild and relatively fleeting left chest discomfort w/o associated Ss.  There were no specific aggravating or alleviating factors.  ECG was non acute.  Troponin was mildly elevated @ 0.05 and has since returned @ 0.03 and 0.05.  He underwent dialysis this AM and has been dialyzed down to 140 kg.  He is feeling better overall but cont to note fairly significant abd bloating/fullness.  He has continued to have intermittent left sided c/p since admission.  He denies pnd, orthopnea, n, v, dizziness, syncope, or early satiety.  He has chronic LEE despite dialysis.    Inpatient Medications  . albuterol  3 mL Inhalation Q4H  .  allopurinol  100 mg Oral Daily  . amLODipine  5 mg Oral Daily  . aspirin  81 mg Oral Daily  . atorvastatin  20 mg Oral QHS  . buPROPion  75 mg Oral BID  . cinacalcet  30 mg Oral Q breakfast  . DULoxetine  120 mg Oral Daily  . furosemide  40 mg Intravenous BID  . furosemide  80 mg Oral Once per day on Sun Tue Thu Sat  . gabapentin  100 mg Oral Daily  . heparin  5,000 Units Subcutaneous 3 times per day  . insulin aspart  0-5 Units Subcutaneous QHS  . insulin aspart  0-9 Units Subcutaneous TID WC  . insulin aspart  22 Units Subcutaneous TID WC  . insulin glargine  11 Units Subcutaneous QHS  . insulin glargine  18 Units Subcutaneous q morning - 10a  . levofloxacin  500 mg Oral Q48H  . metoprolol  50 mg Oral BID  . multivitamin  1 tablet Oral Daily  . nitroGLYCERIN  0.1 mg Transdermal Daily  . pantoprazole  40 mg Oral Daily  . predniSONE  80 mg Oral Q breakfast  . sevelamer carbonate  800 mg Oral TID WC  . vancomycin  1,000 mg Intravenous Once    Family History Family History  Problem Relation Age of Onset  . Diabetes Mother     died @ 64.  [name] Hypertension Mother   . Other Father     alive and well.     Social History History   Social History  . Marital Status: Married    Spouse Name: N/A  . Number of Children: N/A  . Years of Education: N/A   Occupational History  . Not on file.   Social History Main Topics  . Smoking status: Never Smoker   . Smokeless tobacco: Never Used  . Alcohol Use: No  . Drug Use: No  . Sexual Activity: Not on file   Other Topics Concern  . Not on file   Social History Narrative   Retired Emergency planning/management officer.  Does not routinely exercise.     Review of Systems  General:  No chills, fever, night sweats or weight changes.  Cardiovascular:  +++ intermittent chest pain since admission, +++ dyspnea on exertion, +++ bilat chronic LE edema, +++ orthopnea - now improving, no palpitations, paroxysmal nocturnal dyspnea. Dermatological: No rash,  lesions/masses Respiratory: +++ cough, +++ dyspnea Urologic: No hematuria, dysuria Abdominal:   No nausea, vomiting, diarrhea, bright red blood per rectum, melena, or hematemesis Neurologic:  No visual changes, wkns, changes in mental status. All other systems reviewed and are otherwise negative  except as noted above.  Physical Exam  Blood pressure 156/73, pulse 77, temperature 98 F (36.7 C), temperature source Oral, resp. rate 20, height 6\' 1"  (1.854 m), weight 298 lb 15.1 oz (135.6 kg), SpO2 98 %.  General: Pleasant, NAD Psych: Normal affect. Neuro: Alert and oriented X 3. Moves all extremities spontaneously. HEENT: Normal  Neck: Supple without bruits.  JVP ~ 12 cm.   Lungs:  Resp regular and unlabored, diminished breath sounds bilat with insp/exp crackles throughout. Heart: RRR no s3, s4, or murmurs. Abdomen: Firm, protuberant, non-tender, BS + x 4.  Extremities: No clubbing, cyanosis.  2-3+ bilat LE edema to knees. DP/PT/Radials 2+ and equal bilaterally.  Labs   Recent Labs  03/18/15 1638 03/18/15 2019 03/19/15 0127  TROPONINI 0.05* 0.03 0.05*   Lab Results  Component Value Date   WBC 22.5* 03/19/2015   HGB 11.8* 03/19/2015   HCT 36.8* 03/19/2015   MCV 87.4 03/19/2015   PLT 413 03/19/2015     Recent Labs Lab 03/19/15 0127  NA 138  K 6.1*  CL 98*  CO2 24  BUN 97*  CREATININE 7.65*  CALCIUM 7.7*  GLUCOSE 292*   Lab Results  Component Value Date   CHOL 284* 03/19/2015   HDL >40 03/19/2015   LDLCALC SEE COMMENTS 03/19/2015   TRIG 78 03/19/2015   Radiology/Studies  Dg Chest 1 View  03/18/2015   CLINICAL DATA:  For hour history of shortness of breath.  EXAM: CHEST  1 VIEW  COMPARISON:  03/01/2015  FINDINGS: The heart is enlarged. There is vascular congestion, interstitial pulmonary edema, right pleural effusion and overlying atelectasis.  IMPRESSION: CHF with a right effusion and overlying atelectasis.   Electronically Signed   By: Rudie Meyer M.D.   On:  03/18/2015 16:57   Dg Chest 2 View  03/19/2015   CLINICAL DATA:  Short of breath. Leukocytosis. Chronic renal failure with dialysis. 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  EXAM: CHEST  2 VIEW  COMPARISON:  03/18/2015  FINDINGS: Progression of bilateral airspace disease left greater than right. Cardiac enlargement. Right pleural effusion is small and unchanged. No significant effusion on the left. No pneumothorax.  IMPRESSION: Progression of bilateral airspace disease left greater than right. Differential includes pulmonary infection and pulmonary edema. Pulmonary hemorrhage also a possibility.   Electronically Signed   By: Marlan Palau M.D.   On: 03/19/2015 09:01   ECG  RSR 76, LAD, peaked t's in precordial leads.  ASSESSMENT AND PLAN  1.  Acute on chronic diastolic CHF:  Pt presented to the ED yesterday with marked dyspnea and volume overload with 10 kg wt gain over a 48 hr period since dialysis on 6/3.  He has noted that since being placed on prednisone for treatment of BOOP, that he has been retaining much more fluid and that he has been more likely to develop symptoms of dyspnea and abdominal bloating on his non-dialysis days, especially on weekends/Sundays, despite being dialyzed down to his dry weight of 134 kg on his regular dialysis days.  Dietary indiscretion is likely to be playing a role as well, as it appears that he frequents the [employer] in [place], and did so on Saturday 6/4 as well.  He had an echo in 12/2013 revealing nl LV fxn with mild concentric LVH.  BP has been high since admission despite diuresis and dialysis.  I will titrate his amlodipine to 10 mg daily though this may contribute to his lower  extremity edema, in which case, he may ultimately gain more benefit from addition and titration of hydralazine.  He is on metoprolol 50 bid.  Switching him to either labetalol or carvedilol for improved BP mgmt isn't likely to be  ideal 2/2 lung disease with a h/o wheezing.  Volume mgmt per nephrology with plan for repeat dialysis tomorrow.  2.  Precordial Chest Pain with elevated troponin:  Troponin elevation is mild and with a flat trend.  This does not represent ACS and is much more likely 2/2 CHF.  He did develop intermittent c/p in the ED and this has been on and off since admission.  ECG is non-acute.  He has a prior h/o chest pain as well as DOE and poor exercise tolerance overall.  Risk factors for CAD are numerous and include Type I DM (diagnosed @ age 41), HTN, HL, Obesity, and ESRD.  He had a negative myoview @ [facility] in 06/2014.  He would require a 2 day study to re-evaluate and this can likely be done as an outpatient once he has recovered from acute illness and volumes are stable as an outpt.  Cont asa, statin, bb.  3.  Essential HTN:  See #1.  BP's elevated since admission.  Will titrate amlodipine with low threshold to switch to hydralazine if lower extremity edema worsens from baseline (will likely require both).  Cont metoprolol - l;ung dzs likely prohibitive for using labetalol/carvedilol.  4.  HL:  TC 284 with HDL of 49 and LDL of 42 in March. Cont statin therapy.  5.  DM I:  Per IM.  Saw Endocrine on 6/2.  6.  ESRD:  MWF dialysis and diuretic therapy per nephrology.  7.  BOOP:  On high dose steroids with plan to continue therapy for several months.  He has outpt f/u with  Pulm.  8.  Morbid Obesity:  Would benefit from nutritional counseling.  ? pulm rehab.  Signed, Nicolasa Ducking, NP 03/19/2015, 4:21 PM

## 2015-03-19 NOTE — Progress Notes (Signed)
HD END 

## 2015-03-19 NOTE — Progress Notes (Signed)
[facility]  ROUNDING NOTE   Subjective:   Admitted for shortness of breath. Found to be 10 kg above his Estimated dry weight. Placed on nonrebreather.  Seen on hemodialysis. Prior to dialysis was having tachypnea.    Objective:  Vital signs in last 24 hours:  Temp:  [97.6 F (36.4 C)-98.1 F (36.7 C)] 97.8 F (36.6 C) (06/06 0915) Pulse Rate:  [70-84] 83 (06/06 1100) Resp:  [16-43] 22 (06/06 1100) BP: (136-174)/(70-140) 139/78 mmHg (06/06 1100) SpO2:  [92 %-100 %] 94 % (06/06 0917) FiO2 (%):  [35 %] 35 % (06/06 0459) Weight:  [138.347 kg (305 lb)-145.6 kg (320 lb 15.8 oz)] 145.6 kg (320 lb 15.8 oz) (06/06 0915)  Weight change:  Filed Weights   03/18/15 1615 03/18/15 2012 03/19/15 0915  Weight: 138.347 kg (305 lb) 141.658 kg (312 lb 4.8 oz) 145.6 kg (320 lb 15.8 oz)    Intake/Output: I/O last 3 completed shifts: In: -  Out: 250 [Urine:250]   Intake/Output this shift:  Total I/O In: -  Out: 225 [Urine:225]  Physical Exam: General: NAD, mild respiratory distress  Head: Normocephalic, atraumatic. Moist oral mucosal membranes +NRB  Eyes: Anicteric, PERRL  Neck: Supple, trachea midline  Lungs:  Clear to auscultation  Heart: Regular rate and rhythm  Abdomen:  Soft, nontender,   Extremities:  +++ peripheral edema.  Neurologic: Nonfocal, moving all four extremities  Skin: No lesions  Access: L arm AVF    Basic Metabolic Panel:  Recent Labs Lab 03/18/15 1638 03/18/15 2019 03/19/15 0127  NA 141  --  138  K 5.1  --  6.1*  CL 99*  --  98*  CO2 28  --  24  GLUCOSE 79  --  292*  BUN 90*  --  97*  CREATININE 7.47* 7.44* 7.65*  CALCIUM 7.7*  --  7.7*    Liver Function Tests: No results for input(s): AST, ALT, ALKPHOS, BILITOT, PROT, ALBUMIN in the last 168 hours. No results for input(s): LIPASE, AMYLASE in the last 168 hours. No results for input(s): AMMONIA in the last 168 hours.  CBC:  Recent Labs Lab 03/18/15 1638 03/18/15 2019  03/19/15 0127  WBC 17.1* 19.7* 22.5*  HGB 10.5* 11.1* 11.8*  HCT 33.4* 34.1* 36.8*  MCV 87.7 87.4 87.4  PLT 400 389 413    Cardiac Enzymes:  Recent Labs Lab 03/18/15 1638 03/18/15 2019 03/19/15 0127  TROPONINI 0.05* 0.03 0.05*    BNP: Invalid input(s): POCBNP  CBG:  Recent Labs Lab 03/18/15 2050 03/19/15 0725  GLUCAP 139* 288*    Microbiology: Results for orders placed or performed during the hospital encounter of 03/18/15  Culture, blood (routine x 2)     Status: None (Preliminary result)   Collection Time: 03/18/15  6:46 PM  Result Value Ref Range Status   Specimen Description BLOOD  Final   Special Requests Normal  Final   Culture NO GROWTH < 24 HOURS  Final   Report Status PENDING  Incomplete  Culture, blood (routine x 2)     Status: None (Preliminary result)   Collection Time: 03/18/15  6:46 PM  Result Value Ref Range Status   Specimen Description BLOOD  Final   Special Requests Normal  Final   Culture NO GROWTH < 24 HOURS  Final   Report Status PENDING  Incomplete    Coagulation Studies: No results for input(s): LABPROT, INR in the last 72 hours.  Urinalysis: No results for input(s): COLORURINE, LABSPEC, PHURINE, GLUCOSEU,  HGBUR, BILIRUBINUR, KETONESUR, PROTEINUR, UROBILINOGEN, NITRITE, LEUKOCYTESUR in the last 72 hours.  Invalid input(s): APPERANCEUR    Imaging: Dg Chest 1 View  03/18/2015   CLINICAL DATA:  For hour history of shortness of breath.  EXAM: CHEST  1 VIEW  COMPARISON:  03/01/2015  FINDINGS: The heart is enlarged. There is vascular congestion, interstitial pulmonary edema, right pleural effusion and overlying atelectasis.  IMPRESSION: CHF with a right effusion and overlying atelectasis.   Electronically Signed   By: Rudie Meyer M.D.   On: 03/18/2015 16:57   Dg Chest 2 View  03/19/2015   CLINICAL DATA:  Short of breath. Leukocytosis. Chronic renal failure with dialysis. 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  EXAM: CHEST  2 VIEW  COMPARISON:  03/18/2015  FINDINGS: Progression of bilateral airspace disease left greater than right. Cardiac enlargement. Right pleural effusion is small and unchanged. No significant effusion on the left. No pneumothorax.  IMPRESSION: Progression of bilateral airspace disease left greater than right. Differential includes pulmonary infection and pulmonary edema. Pulmonary hemorrhage also a possibility.   Electronically Signed   By: Marlan Palau M.D.   On: 03/19/2015 09:01     Medications:     . albuterol  3 mL Inhalation Q4H  . allopurinol  100 mg Oral Daily  . amLODipine  5 mg Oral Daily  . aspirin  81 mg Oral Daily  . atorvastatin  20 mg Oral QHS  . buPROPion  75 mg Oral BID  . cinacalcet  30 mg Oral Q breakfast  . DULoxetine  120 mg Oral Daily  . furosemide  40 mg Intravenous BID  . furosemide  80 mg Oral Once per day on Sun Tue Thu Sat  . gabapentin  100 mg Oral Daily  . heparin  5,000 Units Subcutaneous 3 times per day  . insulin aspart  0-5 Units Subcutaneous QHS  . insulin aspart  0-9 Units Subcutaneous TID WC  . insulin aspart  22 Units Subcutaneous TID WC  . insulin glargine  11 Units Subcutaneous QHS  . insulin glargine  18 Units Subcutaneous q morning - 10a  . metoprolol  50 mg Oral BID  . multivitamin  1 tablet Oral Daily  . nitroGLYCERIN  0.1 mg Transdermal Daily  . pantoprazole  40 mg Oral Daily  . predniSONE  80 mg Oral Q breakfast  . sevelamer carbonate  800 mg Oral TID WC   acetaminophen **OR** acetaminophen, alum & mag hydroxide-simeth, bisacodyl, clonazePAM, heparin, ondansetron **OR** ondansetron (ZOFRAN) IV, sodium chloride  Assessment/ Plan:  Mr. JAKEOB TULLIS is a 54 y.o. black male with diabetes type 1, hypertension, hyperlipidemia, gout, CHF, hypothyroidism, Vitamin D deficiency, peripheral neuropathy, diabetic retinopathy and obstructive sleep apnea on CPAP, ESRD first outpt HD 10/29/12, BOOP   [facility]  [address] MWF  1. End Stage Renal Disease: seen and examined on hemodialysis. 10 kg above EDW. UF goal today of 4.5 litres. Plan on second treatment tomorrow to get to his dry weight.   2. Hypertension: blood pressure elevated. Due to volume overload and systemic steroids.  - home regimen of amlodipine, furosemide, metoprolol.   3. Secondary Hyperparathyroidism:  Continue cinacalcet and sevelamer. Calcium low, will monitor.   4. Anemia of chronic kidney diease: hemoglobin 11.8. Continue to hold epo.    LOS: 1 Cherry Wittwer 6/6/201611:21 AM

## 2015-03-19 NOTE — Progress Notes (Signed)
PRE HD   

## 2015-03-19 NOTE — Progress Notes (Signed)
[facility] - [place] at [facility]   PATIENT NAME: Ryan Arnold    MR#:  [id number]  DATE OF BIRTH:  [birth year]  SUBJECTIVE:  CHIEF COMPLAINT:   Chief Complaint  Patient presents with  . Shortness of Breath   -Still feels short of breath today. Due for dialysis today. Potassium elevated at 6.1. Patient refused Kayexalate, as he is going for dialysis.  REVIEW OF SYSTEMS:  Review of Systems  Constitutional: Negative for fever and chills.  Respiratory: Positive for shortness of breath. Negative for cough and wheezing.   Cardiovascular: Positive for orthopnea and leg swelling. Negative for chest pain and palpitations.       3+ edema  Gastrointestinal: Negative for nausea, vomiting, abdominal pain, diarrhea and constipation.  Genitourinary: Negative for dysuria.  Neurological: Negative for dizziness, seizures and headaches.    DRUG ALLERGIES:   Allergies  Allergen Reactions  . Oxycodone-Acetaminophen Nausea And Vomiting    VITALS:  Blood pressure 150/86, pulse 79, temperature 97.8 F (36.6 C), temperature source Axillary, resp. rate 21, height  (1.854 m), weight 145.6 kg (320 lb 15.8 oz), SpO2 94 %.  PHYSICAL EXAMINATION:  Physical Exam  GENERAL:  54 y.o.-year-old patient lying in the bed with no acute distress.  EYES: Pupils equal, round, reactive to light and accommodation. No scleral icterus. Extraocular muscles intact.  HEENT: Head atraumatic, normocephalic. Oropharynx and nasopharynx clear.  NECK:  Supple, no jugular venous distention. No thyroid enlargement, no tenderness.  LUNGS: Normal breath sounds bilaterally, no wheezing, rales heard bibasilar. no use of accessory muscles for breathing CARDIOVASCULAR: S1, S2 normal. No murmurs, rubs, or gallops.  ABDOMEN: Soft, nontender, nondistended. Bowel sounds present. No organomegaly or mass.  EXTREMITIES: 3+ pedal edema present, no cyanosis, or clubbing.  NEUROLOGIC: Cranial nerves II  through XII are intact. Muscle strength 5/5 in all extremities. Sensation intact. Gait not checked.  PSYCHIATRIC: The patient is alert and oriented x 3.  SKIN: No obvious rash, lesion, or ulcer.    LABORATORY PANEL:   CBC  Recent Labs Lab 03/19/15 0127  WBC 22.5*  HGB 11.8*  HCT 36.8*  PLT 413   ------------------------------------------------------------------------------------------------------------------  Chemistries   Recent Labs Lab 03/19/15 0127  NA 138  K 6.1*  CL 98*  CO2 24  GLUCOSE 292*  BUN 97*  CREATININE 7.65*  CALCIUM 7.7*   ------------------------------------------------------------------------------------------------------------------  Cardiac Enzymes  Recent Labs Lab 03/19/15 0127  TROPONINI 0.05*   ------------------------------------------------------------------------------------------------------------------  RADIOLOGY:  Dg Chest 1 View  03/18/2015   CLINICAL DATA:  For hour history of shortness of breath.  EXAM: CHEST  1 VIEW  COMPARISON:  03/01/2015  FINDINGS: The heart is enlarged. There is vascular congestion, interstitial pulmonary edema, right pleural effusion and overlying atelectasis.  IMPRESSION: CHF with a right effusion and overlying atelectasis.   Electronically Signed   By: Rudie Meyer M.D.   On: 03/18/2015 16:57   Dg Chest 2 View  03/19/2015   CLINICAL DATA:  Short of breath. Leukocytosis. Chronic renal failure with dialysis. 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  EXAM: CHEST  2 VIEW  COMPARISON:  03/18/2015  FINDINGS: Progression of bilateral airspace disease left greater than right. Cardiac enlargement. Right pleural effusion is small and unchanged. No significant effusion on the left. No pneumothorax.  IMPRESSION: Progression of bilateral airspace disease left greater than right. Differential includes pulmonary infection and pulmonary edema. Pulmonary hemorrhage also a possibility.    Electronically Signed   By: Leonette Most  Chestine Sporelark M.D.   On: 03/19/2015 09:01    EKG:   Orders placed or performed during the hospital encounter of 03/18/15  . ED EKG (<7610mins upon arrival to the ED)  . ED EKG (<3910mins upon arrival to the ED)  . EKG 12-Lead  . EKG 12-Lead    ASSESSMENT AND PLAN:   54y/o M with ESRD on HD, BOOP on steroids orally, admitted for acute resp distress and noted to have pulmonary edema.  #52 acute pulmonary edema-probably has underlying diastolic CHF. Echo done in March 2016, report not seen. -Discussed with his pulmonologist Dr. Dema SeverinMungal, who said that he was trying to get patient to see a cardiologist. Maryclare LabradorWe'll get a cardiology consult in the hospital. -Continue Lasix and also patient on dialysis.  #2 end-stage renal disease on hemodialysis-patient on Monday Wednesday Friday hemodialysis. For dialysis today. Nephrology consulted.  #3 hyperkalemia-refused Kayexalate. Going for dialysis anyways today.  #4 BOOP- being managed by Dr. Dema SeverinMungal as outpatient. On high-dose oral steroids. Will be tapered as outpatient.  #5 Leukocytosis- likely pneumonia, as noted on CXR. Start levaquin today. 1 dose of vancomycin ordered as well  #6 Depression/anxiety- cont home meds  #7 HTN- home meds  #8 DVT prophylaxis- SQ Heparin   All the records are reviewed and case discussed with Care Management/Social Workerr. Management plans discussed with the patient, family and they are in agreement.  CODE STATUS: Full code  TOTAL TIME TAKING CARE OF THIS PATIENT: 38 minutes.   POSSIBLE D/C IN 2 DAYS, DEPENDING ON CLINICAL CONDITION.   Albi Rappaport M.D on 03/19/2015 at 1:00 PM  Between 7am to 6pm - Pager - [phone]  After 6pm go to [email or web address] - password [email or web address] [facility]  [facility]  Office  [phone]  CC: Primary care physician; [name],[name], MD

## 2015-03-19 NOTE — Progress Notes (Signed)
Post HD  

## 2015-03-19 NOTE — Progress Notes (Signed)
Vision niv substituted for cpap (not avail)

## 2015-03-19 NOTE — Progress Notes (Signed)
ANTIBIOTIC CONSULT NOTE - INITIAL  Pharmacy Consult for Levaquin Indication: elevated WBC, rule out infection  Allergies  Allergen Reactions  . Oxycodone-Acetaminophen Nausea And Vomiting    Patient Measurements: Height:  (185.4 cm) Weight: 298 lb 15.1 oz (135.6 kg) IBW/kg (Calculated) : 79.9 Adjusted Body Weight: n/z  Vital Signs: Temp: 98 F (36.7 C) (06/06 1345) Temp Source: Oral (06/06 1345) BP: 156/73 mmHg (06/06 1345) Pulse Rate: 77 (06/06 1345) Intake/Output from previous day: 06/05 0701 - 06/06 0700 In: -  Out: 250 [Urine:250] Intake/Output from this shift: Total I/O In: -  Out: -3183 [Urine:475]  Labs:  Recent Labs  03/18/15 1638 03/18/15 2019 03/19/15 0127  WBC 17.1* 19.7* 22.5*  HGB 10.5* 11.1* 11.8*  PLT 400 389 413  CREATININE 7.47* 7.44* 7.65*   Estimated Creatinine Clearance: 16.5 mL/min (by C-G formula based on Cr of 7.65). No results for input(s): VANCOTROUGH, VANCOPEAK, VANCORANDOM, GENTTROUGH, GENTPEAK, GENTRANDOM, TOBRATROUGH, TOBRAPEAK, TOBRARND, AMIKACINPEAK, AMIKACINTROU, AMIKACIN in the last 72 hours.   Microbiology: Recent Results (from the past 720 hour(s))  Culture, blood (routine x 2)     Status: None (Preliminary result)   Collection Time: 03/18/15  6:46 PM  Result Value Ref Range Status   Specimen Description BLOOD  Final   Special Requests Normal  Final   Culture NO GROWTH < 24 HOURS  Final   Report Status PENDING  Incomplete  Culture, blood (routine x 2)     Status: None (Preliminary result)   Collection Time: 03/18/15  6:46 PM  Result Value Ref Range Status   Specimen Description BLOOD  Final   Special Requests Normal  Final   Culture NO GROWTH < 24 HOURS  Final   Report Status PENDING  Incomplete    Medical History: Past Medical History  Diagnosis Date  . Essential hypertension   . Hyperlipemia   . End stage renal disease     a. on dialysis; still makes urine.  [name] BOOP (bronchiolitis obliterans with  organizing pneumonia)     a. 01/2018 s/p R thoracoscopy/Bx confirming BOOP;  b. 02/2018 high dose steroids started.  . Chronic diastolic CHF (congestive heart failure)     a. 12/2014 Echo: EF 50-55%, mild conc LVH, mildly dil LA.  . Morbid obesity   . Secondary hyperparathyroidism   . Anemia of chronic disease   . Recurrent pneumonia     a. 2/2 BOOP.  [name] Chest pain     a. 06/2014 Myoview ([facility]) no ischemia/infarct, nl EF.  . Type 1 diabetes mellitus     Medications:  Scheduled:  . albuterol  3 mL Inhalation Q4H  . allopurinol  100 mg Oral Daily  . amLODipine  5 mg Oral Daily  . aspirin  81 mg Oral Daily  . atorvastatin  20 mg Oral QHS  . buPROPion  75 mg Oral BID  . cinacalcet  30 mg Oral Q breakfast  . DULoxetine  120 mg Oral Daily  . furosemide  40 mg Intravenous BID  . furosemide  80 mg Oral Once per day on Sun Tue Thu Sat  . gabapentin  100 mg Oral Daily  . heparin  5,000 Units Subcutaneous 3 times per day  . insulin aspart  0-5 Units Subcutaneous QHS  . insulin aspart  0-9 Units Subcutaneous TID WC  . insulin aspart  22 Units Subcutaneous TID WC  . insulin glargine  11 Units Subcutaneous QHS  . insulin glargine  18 Units Subcutaneous q morning - 10a  .  levofloxacin  500 mg Oral Q48H  . metoprolol  50 mg Oral BID  . multivitamin  1 tablet Oral Daily  . nitroGLYCERIN  0.1 mg Transdermal Daily  . pantoprazole  40 mg Oral Daily  . predniSONE  80 mg Oral Q breakfast  . sevelamer carbonate  800 mg Oral TID WC  . vancomycin  1,000 mg Intravenous Once   Anti-infectives    Start     Dose/Rate Route Frequency Ordered Stop   03/19/15 2200  levofloxacin (LEVAQUIN) tablet 500 mg     500 mg Oral Every 48 hours 03/19/15 1607     03/19/15 1330  levofloxacin (LEVAQUIN) tablet 500 mg  Status:  Discontinued     500 mg Oral Daily 03/19/15 1316 03/19/15 1607   03/19/15 1330  vancomycin (VANCOCIN) IVPB 1000 mg/200 mL premix     1,000 mg 200 mL/hr over 60 Minutes Intravenous  Once 03/19/15  1316       Assessment: Patient is being started on Levaquin due to WBC continuing to trend upwards.  Goal of Therapy:  Resolution of symptoms  Plan:  Follow up culture results Patient is on dialysis, will dose Levaquin 500mg  PO q48h.  Clovia CuffLisa Alyza Artiaga, PharmD, BCPS 03/19/2015 4:11 PM

## 2015-03-19 NOTE — Progress Notes (Signed)
HD START 

## 2015-03-19 NOTE — Care Management Note (Signed)
Patient is active at [facility] MWF.  Met with patient while in dialysis. Ryan Arnold  [phone]

## 2015-03-20 LAB — BASIC METABOLIC PANEL
ANION GAP: 14 (ref 5–15)
BUN: 76 mg/dL — ABNORMAL HIGH (ref 6–20)
CO2: 28 mmol/L (ref 22–32)
Calcium: 7.7 mg/dL — ABNORMAL LOW (ref 8.9–10.3)
Chloride: 94 mmol/L — ABNORMAL LOW (ref 101–111)
Creatinine, Ser: 6.4 mg/dL — ABNORMAL HIGH (ref 0.61–1.24)
GFR calc Af Amer: 10 mL/min — ABNORMAL LOW (ref >60)
GFR, EST NON AFRICAN AMERICAN: 9 mL/min — AB (ref >60)
GLUCOSE: 408 mg/dL — AB (ref 65–99)
Potassium: 5.4 mmol/L — ABNORMAL HIGH (ref 3.5–5.1)
Sodium: 136 mmol/L (ref 135–145)

## 2015-03-20 LAB — CBC
HCT: 32.5 % — ABNORMAL LOW (ref 40.0–52.0)
HEMOGLOBIN: 10.2 g/dL — AB (ref 13.0–18.0)
MCH: 27.7 pg (ref 26.0–34.0)
MCHC: 31.5 g/dL — ABNORMAL LOW (ref 32.0–36.0)
MCV: 88 fL (ref 80.0–100.0)
Platelets: 320 10*3/uL (ref 150–440)
RBC: 3.69 MIL/uL — AB (ref 4.40–5.90)
RDW: 20.9 % — ABNORMAL HIGH (ref 11.5–14.5)
WBC: 13.9 10*3/uL — AB (ref 3.8–10.6)

## 2015-03-20 LAB — GLUCOSE, CAPILLARY
GLUCOSE-CAPILLARY: 206 mg/dL — AB (ref 65–99)
Glucose-Capillary: 363 mg/dL — ABNORMAL HIGH (ref 65–99)
Glucose-Capillary: 102 mg/dL — ABNORMAL HIGH (ref 65–99)
Glucose-Capillary: 264 mg/dL — ABNORMAL HIGH (ref 65–99)

## 2015-03-20 LAB — HEPATITIS B SURFACE ANTIGEN: Hepatitis B Surface Ag: NEGATIVE

## 2015-03-20 MED ORDER — INSULIN GLARGINE 100 UNIT/ML ~~LOC~~ SOLN
20.0000 [IU] | Freq: Two times a day (BID) | SUBCUTANEOUS | Status: DC
  Administered 2015-03-20: 20 [IU] via SUBCUTANEOUS
  Filled 2015-03-20 (×5): qty 0.2

## 2015-03-20 MED ORDER — PREDNISONE 50 MG PO TABS
60.0000 mg | ORAL_TABLET | Freq: Every day | ORAL | Status: DC
  Administered 2015-03-21: 60 mg via ORAL
  Filled 2015-03-20: qty 1

## 2015-03-20 NOTE — Progress Notes (Signed)
SUBJECTIVE:   He reports improvement in symptoms with less dyspnea and chest pain.     Filed Vitals:   03/20/15 1300 03/20/15 1308 03/20/15 1341 03/20/15 1551  BP: 184/79 168/77 163/71 163/67  Pulse:  83 85 85  Temp:  98 F (36.7 C) 98.4 F (36.9 C) 98.6 F (37 C)  TempSrc:  Oral Oral Oral  Resp: 35 30 20 20   Height:      Weight:  297 lb 2.9 oz (134.8 kg)    SpO2:   97% 94%    Intake/Output Summary (Last 24 hours) at 03/20/15 1754 Last data filed at 03/20/15 1500  Gross per 24 hour  Intake      0 ml  Output   4328 ml  Net  -4328 ml    LABS: Basic Metabolic Panel:  Recent Labs  16/07/9605/06/16 0127 03/20/15 0543  NA 138 136  K 6.1* 5.4*  CL 98* 94*  CO2 24 28  GLUCOSE 292* 408*  BUN 97* 76*  CREATININE 7.65* 6.40*  CALCIUM 7.7* 7.7*   Liver Function Tests: No results for input(s): AST, ALT, ALKPHOS, BILITOT, PROT, ALBUMIN in the last 72 hours. No results for input(s): LIPASE, AMYLASE in the last 72 hours. CBC:  Recent Labs  03/19/15 0127 03/20/15 0543  WBC 22.5* 13.9*  HGB 11.8* 10.2*  HCT 36.8* 32.5*  MCV 87.4 88.0  PLT 413 320   Cardiac Enzymes:  Recent Labs  03/18/15 1638 03/18/15 2019 03/19/15 0127  TROPONINI 0.05* 0.03 0.05*   BNP: Invalid input(s): POCBNP D-Dimer: No results for input(s): DDIMER in the last 72 hours. Hemoglobin A1C: No results for input(s): HGBA1C in the last 72 hours. Fasting Lipid Panel:  Recent Labs  03/19/15 0127  CHOL 284*  HDL >40  LDLCALC SEE COMMENTS  TRIG 78  CHOLHDL SEE COMMENTS   Thyroid Function Tests: No results for input(s): TSH, T4TOTAL, T3FREE, THYROIDAB in the last 72 hours.  Invalid input(s): FREET3 Anemia Panel: No results for input(s): VITAMINB12, FOLATE, FERRITIN, TIBC, IRON, RETICCTPCT in the last 72 hours.   PHYSICAL EXAM General: Well developed, well nourished, in no acute distress HEENT:  Normocephalic and atramatic Neck:  No JVD.  Lungs: Clear bilaterally to auscultation and  percussion. Heart: HRRR . Normal S1 and S2 without gallops or murmurs.  Abdomen: Bowel sounds are positive, abdomen soft and non-tender  Msk:  Back normal, normal gait. Normal strength and tone for age. Extremities: No clubbing, cyanosis or edema.   Neuro: Alert and oriented X 3. Psych:  Good affect, responds appropriately    ASSESSMENT AND PLAN:   1. Acute on chronic diastolic CHF: Dietary indiscretion is likely to be playing a role as well as steroid-induced. He reports significant improvement with fluid removal.  2. Precordial Chest Pain with elevated troponin: Troponin elevation is mild and with a flat trend. This does not represent ACS and is much more likely 2/2 CHF.  Cont asa, statin, bb. This is likely due to fluid overload. I recommend an outpatient nuclear stress test for evaluation.   3. Essential HTN: See #1. BP's elevated since admission.Continue metoprolol and amlodipine.   4. HL: TC 284 with HDL of 49 and LDL of 42 in March. Cont statin therapy.  5. DM I: Per IM. Saw Endocrine on 6/2.  6. ESRD: MWF dialysis and diuretic therapy per nephrology.  7. BOOP:On tapered dose of steroids.   8. Morbid Obesity: Would benefit from nutritional counseling. ? pulm rehab.  Lorine BearsMuhammad Arida,  MD, [facility] 03/20/2015 5:54 PM

## 2015-03-20 NOTE — Progress Notes (Addendum)
Pt CBG 412, Dr. Clint GuyHower, hospitalist, paged. Pt has 5u of novolog scheduled & 11u of lantus scheduled, MD made aware, & wants to continue with those, not adding anything else. Will continue to monitor. Shirley FriarAlexis Miller, RN

## 2015-03-20 NOTE — Progress Notes (Signed)
Hd tx completed. 

## 2015-03-20 NOTE — Progress Notes (Signed)
Hd tx start  

## 2015-03-20 NOTE — Progress Notes (Signed)
Inpatient Diabetes Program Recommendations  AACE/ADA: New Consensus Statement on Inpatient Glycemic Control (2013)  Target Ranges:  Prepandial:   less than 140 mg/dL      Peak postprandial:   less than 180 mg/dL (1-2 hours)      Critically ill patients:  140 - 180 mg/dL     Results for [name], Arcangel W (MRN [id number]) as of 03/20/2015 08:48  Ref. Range 03/19/2015 07:25 03/19/2015 13:41 03/19/2015 16:23 03/19/2015 20:05  Glucose-Capillary Latest Ref Range: 65-99 mg/dL 865288 (H) 70 784206 (H) 696412 (H)    Results for [name], Davante W (MRN [id number]) as of 03/20/2015 08:48  Ref. Range 03/20/2015 07:33  Glucose-Capillary Latest Ref Range: 65-99 mg/dL 440363 (H)    Home DM Meds: Lantus 18 units AM/ 11 units PM       Novolog 22 units tid with meals  Current DM Orders: Lantus 18 units AM/ 11 units PM            Novolog 22 units tid with meals            Novolog Sensitive SSI (0-9 units) tid ac + HS    MD- Patient's glucose levels have been very labile.  Fingerstick glucose yesterday AM was 288 mg/dl.  Patient received 5 units Novolog SSI and 22 units Novolog meal coverage for this glucose.  Glucose down to 70 mg/dl by lunch time.  As a result, 18 units Lantus was held per MD order.  Glucose by supper time was 206 mg/dl.  Patient only received 3 units Novolog SSI but did not get Novolog 22 units Meal coverage.  As a result, Glucose by bedtime was 412 mg/dl.     MD- Please consider the following in-hospital insulin adjustments:  1. Decrease Novolog Meal Coverage to 50% of home dose for now- Novolog 11 units tid with meals (Hold if patient NPO, Hold of patient eats <50% of meal)  2. Leave Lantus at current dosages- Do not recommend adjustment of Lantus yet since patient missed AM dose of Lantus yesterday     Will follow Ambrose FinlandJeannine Johnston Narjis Mira RN, MSN, CDE Diabetes Coordinator Inpatient Glycemic Control Team Team Pager: [phone] (8a-5p)

## 2015-03-20 NOTE — Progress Notes (Signed)
Discussed with Dr. Dema SeverinMungal- will decrease prednisone to 60mg  qdaily and further taper as outpatient. Also discussed with Dr. Kirke CorinArida- recommended ischemic work up as outpatient.

## 2015-03-20 NOTE — Progress Notes (Signed)
[facility] [place] [facility]   PATIENT NAME: Ryan OxfordMyron Arnold    MR#:  [id number]  DATE OF BIRTH:  07/22/1961  SUBJECTIVE:  CHIEF COMPLAINT:   Chief Complaint  Patient presents with  . Shortness of Breath   -Still feels short of breath today. Due for dialysis today. Potassium elevated at 6.1. Patient refused Kayexalate, as he is going for dialysis.  REVIEW OF SYSTEMS:  ROS  DRUG ALLERGIES:   Allergies  Allergen Reactions  . Oxycodone-Acetaminophen Nausea And Vomiting    VITALS:  Blood pressure 179/72, pulse 86, temperature 98.2 F (36.8 C), temperature source Oral, resp. rate 22, height 6\' 1"  (1.854 m), weight 135.6 kg (298 lb 15.1 oz), SpO2 90 %.  PHYSICAL EXAMINATION:  Physical Exam  GENERAL:  54 y.o.-year-old patient lying in the bed with no acute distress.  EYES: Pupils equal, round, reactive to light and accommodation. No scleral icterus. Extraocular muscles intact.  HEENT: Head atraumatic, normocephalic. Oropharynx and nasopharynx clear.  NECK:  Supple, no jugular venous distention. No thyroid enlargement, no tenderness.  LUNGS: Normal breath sounds bilaterally, no wheezing, rales heard bibasilar. no use of accessory muscles for breathing CARDIOVASCULAR: S1, S2 normal. No murmurs, rubs, or gallops.  ABDOMEN: Soft, nontender, nondistended. Bowel sounds present. No organomegaly or mass.  EXTREMITIES: 3+ pedal edema present, no cyanosis, or clubbing.  NEUROLOGIC: Cranial nerves II through XII are intact. Muscle strength 5/5 in all extremities. Sensation intact. Gait not checked.  PSYCHIATRIC: The patient is alert and oriented x 3.  SKIN: No obvious rash, lesion, or ulcer.    LABORATORY PANEL:   CBC  Recent Labs Lab 03/20/15 0543  WBC 13.9*  HGB 10.2*  HCT 32.5*  PLT 320   ------------------------------------------------------------------------------------------------------------------  Chemistries   Recent Labs Lab  03/20/15 0543  NA 136  K 5.4*  CL 94*  CO2 28  GLUCOSE 408*  BUN 76*  CREATININE 6.40*  CALCIUM 7.7*   ------------------------------------------------------------------------------------------------------------------  Cardiac Enzymes  Recent Labs Lab 03/19/15 0127  TROPONINI 0.05*   ------------------------------------------------------------------------------------------------------------------  RADIOLOGY:  Dg Chest 1 View  03/18/2015   CLINICAL DATA:  For hour history of shortness of breath.  EXAM: CHEST  1 VIEW  COMPARISON:  03/01/2015  FINDINGS: The heart is enlarged. There is vascular congestion, interstitial pulmonary edema, right pleural effusion and overlying atelectasis.  IMPRESSION: CHF with a right effusion and overlying atelectasis.   Electronically Signed   By: Rudie MeyerP.  Gallerani M.D.   On: 03/18/2015 16:57   Dg Chest 2 View  03/19/2015   CLINICAL DATA:  Short of breath. Leukocytosis. Chronic renal failure with dialysis. 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  EXAM: CHEST  2 VIEW  COMPARISON:  03/18/2015  FINDINGS: Progression of bilateral airspace disease left greater than right. Cardiac enlargement. Right pleural effusion is small and unchanged. No significant effusion on the left. No pneumothorax.  IMPRESSION: Progression of bilateral airspace disease left greater than right. Differential includes pulmonary infection and pulmonary edema. Pulmonary hemorrhage also a possibility.   Electronically Signed   By: Marlan Palauharles  Clark M.D.   On: 03/19/2015 09:01    EKG:   Orders placed or performed during the hospital encounter of 03/18/15  . ED EKG (<3910mins upon arrival to the ED)  . ED EKG (<410mins upon arrival to the ED)  . EKG 12-Lead  . 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  #1 acute pulmonary edema-acute on  chronic diastolic CHF.  -Echo done in March 2016, normal EF, diastolic dysfunction noted -appreciate cardiology consult - will discuss with Dr. Dema Severin to reduce/taper steroids- as causing worsening of fluid retention and heart failure -Continue Lasix and also patient on dialysis.  #2 end-stage renal disease on hemodialysis-patient on Monday Wednesday Friday hemodialysis.  Nephrology consulted. - Dialysis yesterday, again today and tomorrow  #3 Hyperkalemia-Going for dialysis today.  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 On high-dose oral steroids. Will se if steroids can be lowered as might be causing worsening of diastolic heart failure.  #5 Leukocytosis- secondary to high dose steroids, also likely pneumonia, as noted on CXR.could be from BOOP changes. Started levaquin. Will continue for 1 week total.  #6 Depression/anxiety- cont home meds  #7 HTN- home meds- BP elevated- metoprolol, norvasc  #8 DVT prophylaxis- SQ Heparin   All the records are reviewed and case discussed with Care Management/Social Workerr. Management plans discussed with the patient, family and they are in agreement.  CODE STATUS: Full code  TOTAL TIME TAKING CARE OF THIS PATIENT: 38 minutes.   POSSIBLE D/C TOMORROW AFTER DIALYSIS, DEPENDING ON CLINICAL CONDITION.   Enid Baas M.D on 03/20/2015 at 8:56 AM  Between 7am to 6pm - Pager - [phone]  After 6pm go to [email or web address] - password [email or web address] [facility]  [place] [facility]  Office  [phone]  CC: Primary care physician; Stephanie Acre, MD

## 2015-03-20 NOTE — Progress Notes (Signed)
Pre-hd tx 

## 2015-03-20 NOTE — Progress Notes (Signed)
Post hd tx 

## 2015-03-20 NOTE — Progress Notes (Signed)
[facility]  ROUNDING NOTE   Subjective:   Seen on hemodialysis. Tolerating procedure well. Second day in a row. UF goal of 5 litres.  Patient states he is breathing better.    Objective:  Vital signs in last 24 hours:  Temp:  [98 F (36.7 C)-98.2 F (36.8 C)] 98.2 F (36.8 C) (06/07 0900) Pulse Rate:  [76-88] 88 (06/07 1100) Resp:  [20-37] 36 (06/07 1100) BP: (140-198)/(72-88) 142/75 mmHg (06/07 1100) SpO2:  [88 %-98 %] 94 % (06/07 0900) FiO2 (%):  [40 %] 40 % (06/07 0505) Weight:  [135.6 kg (298 lb 15.1 oz)-137.4 kg (302 lb 14.6 oz)] 137.4 kg (302 lb 14.6 oz) (06/07 0900)  Weight change: 7.253 kg (15 lb 15.8 oz) Filed Weights   03/19/15 0915 03/19/15 1300 03/20/15 0900  Weight: 145.6 kg (320 lb 15.8 oz) 135.6 kg (298 lb 15.1 oz) 137.4 kg (302 lb 14.6 oz)    Intake/Output: I/O last 3 completed shifts: In: -  Out: -2133 [Urine:1525]   Intake/Output this shift:  Total I/O In: -  Out: 800 [Urine:800]  Physical Exam: General: NAD, mild respiratory distress  Head: Normocephalic, atraumatic. Moist oral mucosal membranes +NRB  Eyes: Anicteric, PERRL  Neck: Supple, trachea midline  Lungs:  Clear to auscultation, 5 Litres [place]   Heart: Regular rate and rhythm  Abdomen:  Soft, nontender,   Extremities:  ++ peripheral edema.  Neurologic: Nonfocal, moving all four extremities  Skin: No lesions  Access: L arm AVF    Basic Metabolic Panel:  Recent Labs Lab 03/18/15 1638 03/18/15 2019 03/19/15 0127 03/20/15 0543  NA 141  --  138 136  K 5.1  --  6.1* 5.4*  CL 99*  --  98* 94*  CO2 28  --  24 28  GLUCOSE 79  --  292* 408*  BUN 90*  --  97* 76*  CREATININE 7.47* 7.44* 7.65* 6.40*  CALCIUM 7.7*  --  7.7* 7.7*    Liver Function Tests: No results for input(s): AST, ALT, ALKPHOS, BILITOT, PROT, ALBUMIN in the last 168 hours. No results for input(s): LIPASE, AMYLASE in the last 168 hours. No results for input(s): AMMONIA in the last 168  hours.  CBC:  Recent Labs Lab 03/18/15 1638 03/18/15 2019 03/19/15 0127 03/20/15 0543  WBC 17.1* 19.7* 22.5* 13.9*  HGB 10.5* 11.1* 11.8* 10.2*  HCT 33.4* 34.1* 36.8* 32.5*  MCV 87.7 87.4 87.4 88.0  PLT 400 389 413 320    Cardiac Enzymes:  Recent Labs Lab 03/18/15 1638 03/18/15 2019 03/19/15 0127  TROPONINI 0.05* 0.03 0.05*    BNP: Invalid input(s): POCBNP  CBG:  Recent Labs Lab 03/19/15 0725 03/19/15 1341 03/19/15 1623 03/19/15 2005 03/20/15 0733  GLUCAP 288* 70 206* 412* 363*    Microbiology: Results for orders placed or performed during the hospital encounter of 03/18/15  Culture, blood (routine x 2)     Status: None (Preliminary result)   Collection Time: 03/18/15  6:46 PM  Result Value Ref Range Status   Specimen Description BLOOD  Final   Special Requests Normal  Final   Culture NO GROWTH 2 DAYS  Final   Report Status PENDING  Incomplete  Culture, blood (routine x 2)     Status: None (Preliminary result)   Collection Time: 03/18/15  6:46 PM  Result Value Ref Range Status   Specimen Description BLOOD  Final   Special Requests Normal  Final   Culture NO GROWTH 2 DAYS  Final  Report Status PENDING  Incomplete    Coagulation Studies: No results for input(s): LABPROT, INR in the last 72 hours.  Urinalysis:  Recent Labs  03/19/15 1815  COLORURINE YELLOW*  LABSPEC 1.008  PHURINE 8.0  GLUCOSEU >500*  HGBUR NEGATIVE  BILIRUBINUR NEGATIVE  KETONESUR NEGATIVE  PROTEINUR >500*  NITRITE NEGATIVE  LEUKOCYTESUR NEGATIVE      Imaging: Dg Chest 1 View  03/18/2015   CLINICAL DATA:  For hour history of shortness of breath.  EXAM: CHEST  1 VIEW  COMPARISON:  03/01/2015  FINDINGS: The heart is enlarged. There is vascular congestion, interstitial pulmonary edema, right pleural effusion and overlying atelectasis.  IMPRESSION: CHF with a right effusion and overlying atelectasis.   Electronically Signed   By: Rudie MeyerP.  Gallerani M.D.   On: 03/18/2015 16:57    Dg Chest 2 View  03/19/2015   CLINICAL DATA:  Short of breath. Leukocytosis. Chronic renal failure with dialysis. 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  EXAM: CHEST  2 VIEW  COMPARISON:  03/18/2015  FINDINGS: Progression of bilateral airspace disease left greater than right. Cardiac enlargement. Right pleural effusion is small and unchanged. No significant effusion on the left. No pneumothorax.  IMPRESSION: Progression of bilateral airspace disease left greater than right. Differential includes pulmonary infection and pulmonary edema. Pulmonary hemorrhage also a possibility.   Electronically Signed   By: Marlan Palauharles  Clark M.D.   On: 03/19/2015 09:01     Medications:     . albuterol  3 mL Inhalation Q4H  . allopurinol  100 mg Oral Daily  . amLODipine  10 mg Oral Daily  . aspirin  81 mg Oral Daily  . atorvastatin  20 mg Oral QHS  . buPROPion  75 mg Oral BID  . cinacalcet  30 mg Oral Q breakfast  . DULoxetine  120 mg Oral Daily  . furosemide  40 mg Intravenous BID  . furosemide  80 mg Oral Once per day on Sun Tue Thu Sat  . gabapentin  100 mg Oral Daily  . heparin  5,000 Units Subcutaneous 3 times per day  . insulin aspart  0-5 Units Subcutaneous QHS  . insulin aspart  0-9 Units Subcutaneous TID WC  . insulin aspart  22 Units Subcutaneous TID WC  . insulin glargine  11 Units Subcutaneous QHS  . insulin glargine  18 Units Subcutaneous q morning - 10a  . levofloxacin  500 mg Oral Q48H  . metoprolol  50 mg Oral BID  . multivitamin  1 tablet Oral Daily  . nitroGLYCERIN  0.1 mg Transdermal Daily  . pantoprazole  40 mg Oral Daily  . predniSONE  80 mg Oral Q breakfast  . sevelamer carbonate  800 mg Oral TID WC   acetaminophen **OR** acetaminophen, alum & mag hydroxide-simeth, bisacodyl, clonazePAM, heparin, ondansetron **OR** ondansetron (ZOFRAN) IV, sodium chloride  Assessment/ Plan:  Ryan Arnold is a 54 y.o. black male with diabetes  type 1, hypertension, hyperlipidemia, gout, CHF, hypothyroidism, Vitamin D deficiency, peripheral neuropathy, diabetic retinopathy and obstructive sleep apnea on CPAP, ESRD first outpt HD 10/29/12, BOOP   [facility] [address] MWF  1. End Stage Renal Disease: seen and examined on hemodialysis. UF goal today of 5 litres. Plan on third treatment tomorrow to get to his dry weight.   2. Hypertension: blood pressure elevated. Due to volume overload and systemic steroids.  - home regimen of amlodipine, furosemide, metoprolol.   3. Secondary Hyperparathyroidism:  Continue cinacalcet and sevelamer.  Calcium low, will monitor.   4. Anemia of chronic kidney diease: hemoglobin 10.2. Continue to hold epo.    LOS: 2 Caliah Kopke 6/7/201611:36 AM

## 2015-03-21 ENCOUNTER — Telehealth: Payer: Self-pay

## 2015-03-21 LAB — RENAL FUNCTION PANEL
ANION GAP: 15 (ref 5–15)
Albumin: 2.6 g/dL — ABNORMAL LOW (ref 3.5–5.0)
BUN: 51 mg/dL — ABNORMAL HIGH (ref 6–20)
CO2: 27 mmol/L (ref 22–32)
Calcium: 7.9 mg/dL — ABNORMAL LOW (ref 8.9–10.3)
Chloride: 95 mmol/L — ABNORMAL LOW (ref 101–111)
Creatinine, Ser: 5.74 mg/dL — ABNORMAL HIGH (ref 0.61–1.24)
GFR calc Af Amer: 12 mL/min — ABNORMAL LOW (ref >60)
GFR calc non Af Amer: 10 mL/min — ABNORMAL LOW (ref >60)
Glucose, Bld: 269 mg/dL — ABNORMAL HIGH (ref 65–99)
Phosphorus: 5.8 mg/dL — ABNORMAL HIGH (ref 2.5–4.6)
Potassium: 4.3 mmol/L (ref 3.5–5.1)
Sodium: 137 mmol/L (ref 135–145)

## 2015-03-21 LAB — CBC
HEMATOCRIT: 29.7 % — AB (ref 40.0–52.0)
Hemoglobin: 9.3 g/dL — ABNORMAL LOW (ref 13.0–18.0)
MCH: 27.8 pg (ref 26.0–34.0)
MCHC: 31.4 g/dL — AB (ref 32.0–36.0)
MCV: 88.3 fL (ref 80.0–100.0)
Platelets: 248 10*3/uL (ref 150–440)
RBC: 3.36 MIL/uL — AB (ref 4.40–5.90)
RDW: 21.3 % — AB (ref 11.5–14.5)
WBC: 12.8 10*3/uL — ABNORMAL HIGH (ref 3.8–10.6)

## 2015-03-21 LAB — BASIC METABOLIC PANEL
Anion gap: 14 (ref 5–15)
BUN: 49 mg/dL — ABNORMAL HIGH (ref 6–20)
CO2: 29 mmol/L (ref 22–32)
Calcium: 8 mg/dL — ABNORMAL LOW (ref 8.9–10.3)
Chloride: 98 mmol/L — ABNORMAL LOW (ref 101–111)
Creatinine, Ser: 5.51 mg/dL — ABNORMAL HIGH (ref 0.61–1.24)
GFR, EST AFRICAN AMERICAN: 13 mL/min — AB (ref >60)
GFR, EST NON AFRICAN AMERICAN: 11 mL/min — AB (ref >60)
GLUCOSE: 47 mg/dL — AB (ref 65–99)
Potassium: 4.2 mmol/L (ref 3.5–5.1)
SODIUM: 141 mmol/L (ref 135–145)

## 2015-03-21 LAB — GLUCOSE, CAPILLARY
GLUCOSE-CAPILLARY: 109 mg/dL — AB (ref 65–99)
Glucose-Capillary: 55 mg/dL — ABNORMAL LOW (ref 65–99)
Glucose-Capillary: 211 mg/dL — ABNORMAL HIGH (ref 65–99)

## 2015-03-21 MED ORDER — AMLODIPINE BESYLATE 10 MG PO TABS: 10.0000 mg | ORAL_TABLET | Freq: Every day | ORAL | Status: DC

## 2015-03-21 MED ORDER — PANTOPRAZOLE SODIUM 40 MG PO TBEC: 40.0000 mg | DELAYED_RELEASE_TABLET | Freq: Every day | ORAL | Status: DC

## 2015-03-21 MED ORDER — CLONAZEPAM 0.5 MG PO TABS: 0.5000 mg | ORAL_TABLET | Freq: Every evening | ORAL | Status: DC | PRN

## 2015-03-21 MED ORDER — LEVOFLOXACIN 500 MG PO TABS: 500.0000 mg | ORAL_TABLET | ORAL | Status: DC

## 2015-03-21 MED ORDER — PREDNISONE 20 MG PO TABS: 60.0000 mg | ORAL_TABLET | Freq: Every day | ORAL | Status: DC

## 2015-03-21 MED ORDER — HYDRALAZINE HCL 25 MG PO TABS: 25.0000 mg | ORAL_TABLET | Freq: Three times a day (TID) | ORAL | Status: DC

## 2015-03-21 MED ORDER — HYDRALAZINE HCL 25 MG PO TABS
25.0000 mg | ORAL_TABLET | Freq: Three times a day (TID) | ORAL | Status: DC
  Administered 2015-03-21: 25 mg via ORAL
  Filled 2015-03-21 (×2): qty 1

## 2015-03-21 MED ORDER — ALBUTEROL SULFATE (2.5 MG/3ML) 0.083% IN NEBU: 3.0000 mL | INHALATION_SOLUTION | Freq: Four times a day (QID) | RESPIRATORY_TRACT | Status: DC | PRN

## 2015-03-21 MED ORDER — ASPIRIN 81 MG PO CHEW: 81.0000 mg | CHEWABLE_TABLET | Freq: Every day | ORAL | Status: DC

## 2015-03-21 NOTE — Telephone Encounter (Signed)
TCM appointment with Dr. Kirke CorinArida July 11th at 3:15pm.

## 2015-03-21 NOTE — Progress Notes (Signed)
[facility]  ROUNDING NOTE   Subjective:   Seen on hemodialysis. Tolerating procedure well. Third day in a row. UF goal of 4.5 litres.  Patient states he is breathing better.    Objective:  Vital signs in last 24 hours:  Temp:  [98 F (36.7 C)-99.7 F (37.6 C)] 98.2 F (36.8 C) (06/08 0930) Pulse Rate:  [80-94] 89 (06/08 0930) Resp:  [20-36] 30 (06/08 1000) BP: (142-184)/(67-128) 172/128 mmHg (06/08 1000) SpO2:  [92 %-99 %] 97 % (06/08 0900) FiO2 (%):  [32 %-40 %] 32 % (06/08 0839) Weight:  [134.8 kg (297 lb 2.9 oz)-136.124 kg (300 lb 1.6 oz)] 136.1 kg (300 lb 0.7 oz) (06/08 0930)  Weight change: -8.2 kg (-18 lb 1.3 oz) Filed Weights   03/21/15 0622 03/21/15 0909 03/21/15 0930  Weight: 136.124 kg (300 lb 1.6 oz) 136.1 kg (300 lb 0.7 oz) 136.1 kg (300 lb 0.7 oz)    Intake/Output: I/O last 3 completed shifts: In: 240 [P.O.:240] Out: 4328 [Urine:2200; Other:2128]   Intake/Output this shift:  Total I/O In: -  Out: 400 [Urine:400]  Physical Exam: General: NAD, mild respiratory distress  Head: Normocephalic, atraumatic. Moist oral mucosal membranes +NRB  Eyes: Anicteric, PERRL  Neck: Supple, trachea midline  Lungs:  Clear to auscultation, 5 Litres [place]   Heart: Regular rate and rhythm  Abdomen:  Soft, nontender,   Extremities: + peripheral edema.  Neurologic: Nonfocal, moving all four extremities  Skin: No lesions  Access: L arm AVF    Basic Metabolic Panel:  Recent Labs Lab 03/18/15 1638 03/18/15 2019 03/19/15 0127 03/20/15 0543 03/21/15 0442  NA 141  --  138 136 141  K 5.1  --  6.1* 5.4* 4.2  CL 99*  --  98* 94* 98*  CO2 28  --  24 28 29   GLUCOSE 79  --  292* 408* 47*  BUN 90*  --  97* 76* 49*  CREATININE 7.47* 7.44* 7.65* 6.40* 5.51*  CALCIUM 7.7*  --  7.7* 7.7* 8.0*    Liver Function Tests: No results for input(s): AST, ALT, ALKPHOS, BILITOT, PROT, ALBUMIN in the last 168 hours. No results for input(s): LIPASE, AMYLASE in the last 168  hours. No results for input(s): AMMONIA in the last 168 hours.  CBC:  Recent Labs Lab 03/18/15 1638 03/18/15 2019 03/19/15 0127 03/20/15 0543  WBC 17.1* 19.7* 22.5* 13.9*  HGB 10.5* 11.1* 11.8* 10.2*  HCT 33.4* 34.1* 36.8* 32.5*  MCV 87.7 87.4 87.4 88.0  PLT 400 389 413 320    Cardiac Enzymes:  Recent Labs Lab 03/18/15 1638 03/18/15 2019 03/19/15 0127  TROPONINI 0.05* 0.03 0.05*    BNP: Invalid input(s): POCBNP  CBG:  Recent Labs Lab 03/20/15 1338 03/20/15 1635 03/20/15 2031 03/21/15 0721 03/21/15 0821  GLUCAP 102* 206* 264* 55* 109*    Microbiology: Results for orders placed or performed during the hospital encounter of 03/18/15  Culture, blood (routine x 2)     Status: None (Preliminary result)   Collection Time: 03/18/15  6:46 PM  Result Value Ref Range Status   Specimen Description BLOOD  Final   Special Requests Normal  Final   Culture NO GROWTH 3 DAYS  Final   Report Status PENDING  Incomplete  Culture, blood (routine x 2)     Status: None (Preliminary result)   Collection Time: 03/18/15  6:46 PM  Result Value Ref Range Status   Specimen Description BLOOD  Final   Special Requests Normal  Final   Culture NO GROWTH 3 DAYS  Final   Report Status PENDING  Incomplete    Coagulation Studies: No results for input(s): LABPROT, INR in the last 72 hours.  Urinalysis:  Recent Labs  03/19/15 1815  COLORURINE YELLOW*  LABSPEC 1.008  PHURINE 8.0  GLUCOSEU >500*  HGBUR NEGATIVE  BILIRUBINUR NEGATIVE  KETONESUR NEGATIVE  PROTEINUR >500*  NITRITE NEGATIVE  LEUKOCYTESUR NEGATIVE      Imaging: No results found.   Medications:     . albuterol  3 mL Inhalation Q4H  . allopurinol  100 mg Oral Daily  . amLODipine  10 mg Oral Daily  . aspirin  81 mg Oral Daily  . atorvastatin  20 mg Oral QHS  . buPROPion  75 mg Oral BID  . cinacalcet  30 mg Oral Q breakfast  . DULoxetine  120 mg Oral Daily  . furosemide  40 mg Intravenous BID  .  furosemide  80 mg Oral Once per day on Sun Tue Thu Sat  . gabapentin  100 mg Oral Daily  . heparin  5,000 Units Subcutaneous 3 times per day  . insulin aspart  0-5 Units Subcutaneous QHS  . insulin aspart  0-9 Units Subcutaneous TID WC  . insulin aspart  22 Units Subcutaneous TID WC  . insulin glargine  20 Units Subcutaneous BID  . levofloxacin  500 mg Oral Q48H  . metoprolol  50 mg Oral BID  . multivitamin  1 tablet Oral Daily  . nitroGLYCERIN  0.1 mg Transdermal Daily  . pantoprazole  40 mg Oral Daily  . predniSONE  60 mg Oral Q breakfast  . sevelamer carbonate  800 mg Oral TID WC   acetaminophen **OR** acetaminophen, alum & mag hydroxide-simeth, bisacodyl, clonazePAM, heparin, ondansetron **OR** ondansetron (ZOFRAN) IV, sodium chloride  Assessment/ Plan:  [name] is a 54 y.o. black male with diabetes type 1, hypertension, hyperlipidemia, gout, CHF, hypothyroidism, Vitamin D deficiency, peripheral neuropathy, diabetic retinopathy and obstructive sleep apnea on CPAP, ESRD first outpt HD 10/29/12, BOOP   [facility] [address] MWF  1. End Stage Renal Disease: seen and examined on hemodialysis. UF goal today of 4.5 litres.  third treatment tomorrow to get to his dry weight.  - Low salt diet discussed.  - steroids may be playing a role in fluid retention, now reduced to  PO.   2. Hypertension: blood pressure elevated. Due to volume overload and systemic steroids.  - home regimen of amlodipine, furosemide, metoprolol.   3. Secondary Hyperparathyroidism:  Continue cinacalcet and sevelamer. Calcium low, will monitor.   4. Anemia of chronic kidney diease: hemoglobin 10.2. Continue to hold epo.    LOS: 3 Ryan Arnold 6/8/201610:32 AM

## 2015-03-21 NOTE — Discharge Summary (Signed)
[facility] - [place] at [facility]   PATIENT NAME: Ryan OxfordMyron Arnold    MR#:  [id number]  DATE OF BIRTH:  08/14/1961  DATE OF ADMISSION:  03/18/2015 ADMITTING PHYSICIAN: Adrian SaranSital Mody, MD  DATE OF DISCHARGE: 03/21/15  PRIMARY CARE PHYSICIAN: [name],[name], MD    ADMISSION DIAGNOSIS:  Acute respiratory distress [J80] CKD (chronic kidney disease) stage V requiring chronic dialysis [N18.6, Z99.2] Acute on chronic congestive heart failure, unspecified congestive heart failure type [I50.9]  DISCHARGE DIAGNOSIS:  Principal Problem:   Acute on chronic diastolic CHF (congestive heart failure), NYHA class 3 Active Problems:   OSA (obstructive sleep apnea)   BOOP (bronchiolitis obliterans with organizing pneumonia)   Type 1 diabetes mellitus   Anemia of chronic disease   Secondary hyperparathyroidism   Morbid obesity   End stage renal disease   Hyperlipemia   Essential hypertension   Chest pain   Hyperkalemia   SECONDARY DIAGNOSIS:   Past Medical History  Diagnosis Date  . Essential hypertension   . Hyperlipemia   . End stage renal disease     a. on dialysis; still makes urine.  Ryan Arnold. BOOP (bronchiolitis obliterans with organizing pneumonia)     a. 01/2018 s/p R thoracoscopy/Bx confirming BOOP;  b. 02/2018 high dose steroids started.  . Chronic diastolic CHF (congestive heart failure)     a. 12/2014 Echo: EF 50-55%, mild conc LVH, mildly dil LA.  . Morbid obesity   . Secondary hyperparathyroidism   . Anemia of chronic disease   . Recurrent pneumonia     a. 2/2 BOOP.  Ryan Arnold. Chest pain     a. 06/2014 Myoview ([facility]) no ischemia/infarct, nl EF.  . Type 1 diabetes mellitus     HOSPITAL COURSE:   54y/o M with ESRD on HD, BOOP on steroids orally, admitted for acute resp distress and noted to have pulmonary edema.  #1 acute pulmonary edema-acute on chronic diastolic CHF.  -Echo done in March 2016, normal EF, diastolic dysfunction noted -appreciate cardiology  consult - Discussed with Dr. Dema SeverinMungal and reduced prednisone dose for now- as causing worsening of fluid retention and heart failure -Continue Lasix and also patient on dialysis. - outpatient ischemia work up per cardiology  #2 end-stage renal disease on hemodialysis-patient on Monday Wednesday Friday hemodialysis. Nephrology consulted. - Dialysis today per schedule.Ryan Arnold. Discharge today after dialysis.  # 3- OSA- CPAP at nights  #4 BOOP- being managed by Dr. Dema SeverinMungal as outpatient. On high-dose oral steroids. Steroids dose reduced for now as causing worsening of diastolic heart failure.  #5 Leukocytosis- secondary to high dose steroids, also likely pneumonia, as noted on CXR.could be from BOOP changes. Started levaquin x 7 days.   #6 Depression/anxiety- cont home meds  #7 HTN- home meds- BP elevated- metoprolol, norvasc  DISCHARGE CONDITIONS:   Stable  CONSULTS OBTAINED:  Treatment Team:  Mosetta PigeonHarmeet Singh, MD Iran OuchMuhammad A Arida, MD  DRUG ALLERGIES:   Allergies  Allergen Reactions  . Oxycodone-Acetaminophen Nausea And Vomiting    DISCHARGE MEDICATIONS:   Current Discharge Medication List    START taking these medications   Details  albuterol (PROVENTIL) (2.5 MG/3ML) 0.083% nebulizer solution Inhale 3 mLs into the lungs every 6 (six) hours as needed for wheezing or shortness of breath. Qty: 75 mL, Refills: 2    aspirin 81 MG chewable tablet Chew 1 tablet (81 mg total) by mouth daily. Qty: 30 tablet, Refills: 2    levofloxacin (LEVAQUIN) 500 MG tablet Take 1 tablet (500 mg total) by  mouth every other day. Qty: 3 tablet, Refills: 0      CONTINUE these medications which have CHANGED   Details  amLODipine (NORVASC) 10 MG tablet Take 1 tablet (10 mg total) by mouth daily. Qty: 30 tablet, Refills: 1    clonazePAM (KLONOPIN) 0.5 MG tablet Take 1 tablet (0.5 mg total) by mouth at bedtime as needed. Qty: 20 tablet, Refills: 0    pantoprazole (PROTONIX) 40 MG tablet Take 1 tablet  (40 mg total) by mouth daily. Qty: 30 tablet, Refills: 2    predniSONE (DELTASONE) 20 MG tablet Take 3 tablets (60 mg total) by mouth daily with breakfast. Qty: 90 tablet, Refills: 0      CONTINUE these medications which have NOT CHANGED   Details  allopurinol (ZYLOPRIM) 100 MG tablet Take 100 mg by mouth daily.    atorvastatin (LIPITOR) 20 MG tablet Take 20 mg by mouth at bedtime.    buPROPion (WELLBUTRIN) 75 MG tablet Take 75 mg by mouth 2 (two) times daily. Refills: 0    DULoxetine (CYMBALTA) 60 MG capsule Take 120 mg by mouth daily. Refills: 0    !! furosemide (LASIX) 40 MG tablet Take 40 mg by mouth 2 (two) times daily.    !! furosemide (LASIX) 80 MG tablet Take 80 mg by mouth 4 (four) times a week. Tuesday, Thursday, Saturday, and Sunday. Refills: 0    gabapentin (NEURONTIN) 100 MG capsule Take 100 mg by mouth daily. Refills: 1    insulin aspart (NOVOLOG) 100 UNIT/ML FlexPen Inject 22 Units into the skin 3 (three) times daily with meals.     LANTUS SOLOSTAR 100 UNIT/ML Solostar Pen Inject 11-18 Units into the skin See admin instructions. Inject 18 units subcutaneous every morning and Inject 11 units subcutaneous every night at bedtime. Refills: 1    metoprolol (LOPRESSOR) 50 MG tablet Take 50 mg by mouth 2 (two) times daily.    multivitamin (RENA-VIT) TABS tablet Take 1 tablet by mouth daily.    SENSIPAR 30 MG tablet Take 30 mg by mouth daily.    sevelamer carbonate (RENVELA) 800 MG tablet Take 800 mg by mouth 3 (three) times daily.     !! - Potential duplicate medications found. Please discuss with provider.    STOP taking these medications     albuterol (PROVENTIL HFA;VENTOLIN HFA) 108 (90 BASE) MCG/ACT inhaler          DISCHARGE INSTRUCTIONS:   1. PCP f/u in 1-2 weeks 2. Pulmonary f/u in 3 weeks 3. Cardiology f/u in 3 weeks 4. Endocrinology follow up for diabetes management in 1-2 weeks  If you experience worsening of your admission symptoms, develop  shortness of breath, life threatening emergency, suicidal or homicidal thoughts you must seek medical attention immediately by calling 911 or calling your MD immediately  if symptoms less severe.  You Must read complete instructions/literature along with all the possible adverse reactions/side effects for all the Medicines you take and that have been prescribed to you. Take any new Medicines after you have completely understood and accept all the possible adverse reactions/side effects.   Please note  You were cared for by a hospitalist during your hospital stay. If you have any questions about your discharge medications or the care you received while you were in the hospital after you are discharged, you can call the unit and asked to speak with the hospitalist on call if the hospitalist that took care of you is not available. Once you are discharged, your primary  care physician will handle any further medical issues. Please note that NO REFILLS for any discharge medications will be authorized once you are discharged, as it is imperative that you return to your primary care physician (or establish a relationship with a primary care physician if you do not have one) for your aftercare needs so that they can reassess your need for medications and monitor your lab values.    Today   CHIEF COMPLAINT:   Chief Complaint  Patient presents with  . Shortness of Breath    VITAL SIGNS:  Blood pressure 172/128, pulse 89, temperature 98.2 F (36.8 C), temperature source Oral, resp. rate 30, height  (1.854 m), weight 136.1 kg (300 lb 0.7 oz), SpO2 97 %.  I/O:   Intake/Output Summary (Last 24 hours) at 03/21/15 1044 Last data filed at 03/21/15 0950  Gross per 24 hour  Intake    240 ml  Output   3128 ml  Net  -2888 ml    PHYSICAL EXAMINATION:   Physical Exam  GENERAL: 54 y.o.-year-old patient lying in the bed with no acute distress.  EYES: Pupils equal, round, reactive to light and  accommodation. No scleral icterus. Extraocular muscles intact.  HEENT: Head atraumatic, normocephalic. Oropharynx and nasopharynx clear.  NECK: Supple, no jugular venous distention. No thyroid enlargement, no tenderness.  LUNGS: Normal breath sounds bilaterally, no wheezing, rales heard bibasilar. no use of accessory muscles for breathing CARDIOVASCULAR: S1, S2 normal. No murmurs, rubs, or gallops.  ABDOMEN: Soft, nontender, nondistended. Bowel sounds present. No organomegaly or mass.  EXTREMITIES: 3+ pedal edema present, no cyanosis, or clubbing.  NEUROLOGIC: Cranial nerves II through XII are intact. Muscle strength 5/5 in all extremities. Sensation intact. Gait not checked.  PSYCHIATRIC: The patient is alert and oriented x 3.  SKIN: No obvious rash, lesion, or ulcer.   DATA REVIEW:   CBC  Recent Labs Lab 03/20/15 0543  WBC 13.9*  HGB 10.2*  HCT 32.5*  PLT 320    Chemistries   Recent Labs Lab 03/21/15 0442  NA 141  K 4.2  CL 98*  CO2 29  GLUCOSE 47*  BUN 49*  CREATININE 5.51*  CALCIUM 8.0*    Cardiac Enzymes  Recent Labs Lab 03/19/15 0127  TROPONINI 0.05*    Microbiology Results  Results for orders placed or performed during the hospital encounter of 03/18/15  Culture, blood (routine x 2)     Status: None (Preliminary result)   Collection Time: 03/18/15  6:46 PM  Result Value Ref Range Status   Specimen Description BLOOD  Final   Special Requests Normal  Final   Culture NO GROWTH 3 DAYS  Final   Report Status PENDING  Incomplete  Culture, blood (routine x 2)     Status: None (Preliminary result)   Collection Time: 03/18/15  6:46 PM  Result Value Ref Range Status   Specimen Description BLOOD  Final   Special Requests Normal  Final   Culture NO GROWTH 3 DAYS  Final   Report Status PENDING  Incomplete    RADIOLOGY:  No results found.  EKG:   Orders placed or performed during the hospital encounter of 03/18/15  . ED EKG (<30mins upon  arrival to the ED)  . ED EKG (<79mins upon arrival to the ED)  . EKG 12-Lead  . EKG 12-Lead      Management plans discussed with the patient, family and they are in agreement.  CODE STATUS:     Code Status  Orders        Start     Ordered   03/18/15 2012  Full code   Continuous     03/18/15 2011      TOTAL TIME TAKING CARE OF THIS PATIENT: 38 minutes.    Enid Baas M.D on 03/21/2015 at 10:44 AM  Between 7am to 6pm - Pager - [phone]  After 6pm go to [email or web address] - password [email or web address] [facility]  [facility]  Office  [phone]  CC: Primary care physician; Stephanie Acre, MD

## 2015-03-21 NOTE — Progress Notes (Signed)
[facility] Physicians - [place] at [facility]   PATIENT NAME: Ryan Arnold    MR#:  [id number]  DATE OF BIRTH:  07-10-1961  SUBJECTIVE:  CHIEF COMPLAINT:   Chief Complaint  Patient presents with  . Shortness of Breath   -Still feels short of breath today. Due for dialysis today. Potassium elevated at 6.1. Patient refused Kayexalate, as he is going for dialysis.  REVIEW OF SYSTEMS:  ROS  DRUG ALLERGIES:   Allergies  Allergen Reactions  . Oxycodone-Acetaminophen Nausea And Vomiting    VITALS:  Blood pressure 172/128, pulse 89, temperature 98.2 F (36.8 C), temperature source Oral, resp. rate 30, height  (1.854 m), weight 136.1 kg (300 lb 0.7 oz), SpO2 97 %.  PHYSICAL EXAMINATION:  Physical Exam  GENERAL:  54 y.o.-year-old patient lying in the bed with no acute distress.  EYES: Pupils equal, round, reactive to light and accommodation. No scleral icterus. Extraocular muscles intact.  HEENT: Head atraumatic, normocephalic. Oropharynx and nasopharynx clear.  NECK:  Supple, no jugular venous distention. No thyroid enlargement, no tenderness.  LUNGS: Normal breath sounds bilaterally, no wheezing, rales heard bibasilar. no use of accessory muscles for breathing CARDIOVASCULAR: S1, S2 normal. No murmurs, rubs, or gallops.  ABDOMEN: Soft, nontender, nondistended. Bowel sounds present. No organomegaly or mass.  EXTREMITIES: 3+ pedal edema present, no cyanosis, or clubbing.  NEUROLOGIC: Cranial nerves II through XII are intact. Muscle strength 5/5 in all extremities. Sensation intact. Gait not checked.  PSYCHIATRIC: The patient is alert and oriented x 3.  SKIN: No obvious rash, lesion, or ulcer.    LABORATORY PANEL:   CBC  Recent Labs Lab 03/20/15 0543  WBC 13.9*  HGB 10.2*  HCT 32.5*  PLT 320   ------------------------------------------------------------------------------------------------------------------  Chemistries   Recent Labs Lab  03/21/15 0442  NA 141  K 4.2  CL 98*  CO2 29  GLUCOSE 47*  BUN 49*  CREATININE 5.51*  CALCIUM 8.0*   ------------------------------------------------------------------------------------------------------------------  Cardiac Enzymes  Recent Labs Lab 03/19/15 0127  TROPONINI 0.05*   ------------------------------------------------------------------------------------------------------------------  RADIOLOGY:  No results found.  EKG:   Orders placed or performed during the hospital encounter of 03/18/15  . ED EKG (<72mins upon arrival to the ED)  . ED EKG (<77mins upon arrival to the ED)  . EKG 12-Lead  . EKG 12-Lead    ASSESSMENT AND PLAN:   54y/o M with ESRD on HD, BOOP on steroids orally, admitted for acute resp distress and noted to have pulmonary edema.  #1 acute pulmonary edema-acute on chronic diastolic CHF.  -Echo done in March 2016, normal EF, diastolic dysfunction noted -appreciate cardiology consult - Discussed with Dr. Dema Severin and reduced prednisone dose for now- as causing worsening of fluid retention and heart failure -Continue Lasix and also patient on dialysis. - outpatient ischemia work up per cardiology  #2 end-stage renal disease on hemodialysis-patient on Monday Wednesday Friday hemodialysis.  Nephrology consulted. - Dialysis today per schedule.[name] Discharge today after dialysis.  # 3- OSA- CPAP at nights  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 On high-dose oral steroids. Steroids dose reduced for now as causing worsening of diastolic heart failure.  #5 Leukocytosis- secondary to high dose steroids, also likely pneumonia, as noted on CXR.could be from BOOP changes. Started levaquin x 7 days.   #6 Depression/anxiety- cont home meds  #7 HTN- home meds- BP elevated- metoprolol, norvasc  #8 DVT prophylaxis- SQ Heparin   All the records are reviewed and case discussed  with Care Management/Social Workerr. Management plans discussed  with the patient, family and they are in agreement.  CODE STATUS: Full code  TOTAL TIME TAKING CARE OF THIS PATIENT: 38 minutes.   POSSIBLE D/C TODAY AFTER DIALYSIS, DEPENDING ON CLINICAL CONDITION.   Arick Mareno M.D on 03/21/2015 at 10:30 AM  Between 7am to 6pm - Pager - [phone]  After 6pm go to [email or web address] - password [email or web address] [email or web address]  [facility] [place] [facility]  Office  [phone]  CC: Primary care physician; [name],[name], MD

## 2015-03-21 NOTE — Care Management (Signed)
Notified dialysis coordinator of discharge home.

## 2015-03-21 NOTE — Discharge Instructions (Signed)
°  DIET:  Renal diet  DISCHARGE CONDITION:  Stable  ACTIVITY:  Activity as tolerated  OXYGEN:  Home Oxygen: 3l    Oxygen Delivery: Via nasal cannula  USE CPAP AT BEDTIME  DISCHARGE LOCATION:  home   If you experience worsening of your admission symptoms, develop shortness of breath, life threatening emergency, suicidal or homicidal thoughts you must seek medical attention immediately by calling 911 or calling your MD immediately  if symptoms less severe.  You Must read complete instructions/literature along with all the possible adverse reactions/side effects for all the Medicines you take and that have been prescribed to you. Take any new Medicines after you have completely understood and accpet all the possible adverse reactions/side effects.   Please note  You were cared for by a hospitalist during your hospital stay. If you have any questions about your discharge medications or the care you received while you were in the hospital after you are discharged, you can call the unit and asked to speak with the hospitalist on call if the hospitalist that took care of you is not available. Once you are discharged, your primary care physician will handle any further medical issues. Please note that NO REFILLS for any discharge medications will be authorized once you are discharged, as it is imperative that you return to your primary care physician (or establish a relationship with a primary care physician if you do not have one) for your aftercare needs so that they can reassess your need for medications and monitor your lab values.

## 2015-03-23 LAB — CULTURE, BLOOD (ROUTINE X 2)
CULTURE: NO GROWTH
CULTURE: NO GROWTH
SPECIAL REQUESTS: NORMAL
Special Requests: NORMAL

## 2015-03-24 DIAGNOSIS — G4733 Obstructive sleep apnea (adult) (pediatric): Secondary | ICD-10-CM | POA: Diagnosis not present

## 2015-03-26 DIAGNOSIS — F331 Major depressive disorder, recurrent, moderate: Secondary | ICD-10-CM | POA: Diagnosis not present

## 2015-03-26 DIAGNOSIS — F419 Anxiety disorder, unspecified: Secondary | ICD-10-CM | POA: Diagnosis not present

## 2015-03-29 ENCOUNTER — Encounter: Payer: Self-pay | Admitting: Endocrinology

## 2015-03-29 ENCOUNTER — Ambulatory Visit (INDEPENDENT_AMBULATORY_CARE_PROVIDER_SITE_OTHER): Payer: BC Managed Care – PPO | Admitting: Endocrinology

## 2015-03-29 VITALS — BP 132/60 | HR 83 | Resp 14 | Ht 73.0 in | Wt 298.8 lb

## 2015-03-29 DIAGNOSIS — H3523 Other non-diabetic proliferative retinopathy, bilateral: Secondary | ICD-10-CM

## 2015-03-29 DIAGNOSIS — I10 Essential (primary) hypertension: Secondary | ICD-10-CM

## 2015-03-29 DIAGNOSIS — N186 End stage renal disease: Secondary | ICD-10-CM

## 2015-03-29 DIAGNOSIS — E10351 Type 1 diabetes mellitus with proliferative diabetic retinopathy with macular edema: Secondary | ICD-10-CM

## 2015-03-29 DIAGNOSIS — Z992 Dependence on renal dialysis: Secondary | ICD-10-CM

## 2015-03-29 DIAGNOSIS — E103519 Type 1 diabetes mellitus with proliferative diabetic retinopathy with macular edema, unspecified eye: Secondary | ICD-10-CM

## 2015-03-29 NOTE — Progress Notes (Signed)
Reason for visit-  Ryan Arnold is a 54 y.o.-year-old male, referred by his Pulmonologist, Dr Dema Severin,  for management of Type 1 diabetes, uncontrolled, with complications ( ESRD on dialysis, proliferative retinopathy, neuropathy, hypoglycemia unawareness).  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 Now on prednisone 60 mg daily in the morning, no taper till next few months. Follow up pulm at end of month.  PCP is Dr. Arty Baumgartner ( [facility])-Care everywhere reviewed. Was seeing Dr Ivar Drape at [facility], now her wait time is till Nov, and pulm wanted him to be seen sooner- hence the referral. Last visit June 2016.  Was hospitalized in June for respiratory issues and dose of steroids brought down to 60 mg this time    HPI- Patient has been diagnosed with Type 1 diabetes at age 31. Recalls being initially on lifestyle modifications.   he has  been on insulin since diagnosis. Has tried other insulin therapies in the past. Was on insulin pump 2 years ago, and then taken off pump during hospitalization and never put back on it. 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 "Now, it doesn't make a difference to him" re: pump vs MDI.   Pt is currently on a regimen of: - Lantus 18 units am and 11 units pm - Novolog  20+1 scale qac uses the sliding scale with meals Not using for corrective boluses like last time     Last hemoglobin A1c was:  8.4% March 2016 9.4% Dec 2015 9.8% Sept 2015  Lab Results  Component Value Date   HGBA1C 7.0* 03/15/2015     Pt checks his sugars 2-3 a day . Uses one touch glucometer. By meter download they are:  PREMEAL Breakfast Lunch Dinner Bedtime Overall  Glucose range: 42-518 134-557 293-475 176-192   Mean/median:        POST-MEAL PC Breakfast PC Lunch PC Dinner  Glucose range:     Mean/median:      *sugars dont  seem to be different on HD days and notHD days  Hypoglycemia-  No lows. Lowest sugar was 65 ; he hasn't hypoglycemia awareness.  Dietary habits- eats 2 times daily. Tries to limit carbs, sweetened beverages, sodas, desserts.  Eats out most of the time Sweet tea mixed with unsweet - 2 glasses daily  Doing slightly better since last time  Exercise-  On continuous 3L oxygen, getting better with SOB Weight - fluctuates  Wt Readings from Last 3 Encounters:  03/29/15 298 lb 12 oz (135.512 kg)  03/21/15 292 lb 3.2 oz (132.541 kg)  03/15/15 304 lb 7.3 oz (138.1 kg)    Diabetes Complications-  Nephropathy- Yes, Stage5  CKD on HD since jan 2015, last BUN/creatinine- GFR 8-14. Followed by Dr Ronn Melena. Patient is on living donor transplant list. HD q M/W/F Lab Results  Component Value Date   BUN 51* 03/21/2015   CREATININE 5.74* 03/21/2015   No results found for: GFR   Retinopathy- Yes, had recent DEE. Neuropathy- yes numbness and tingling in )his feet. known neuropathy. Rxed with gabapentin and cymbalta. Associated history - No CAD . No prior stroke. No hypothyroidism. his last TSH was No results found for: TSH  TSH 5.5 in October 2015. Prior was mildy elevated in presence of normal free T4 around May 2015.  Hyperlipidemia-  his last set of lipids were- Currently on lipitor  . Tolerating well.   Lab Results  Component Value Date   CHOL 284* 03/19/2015   HDL >40 03/19/2015   LDLCALC SEE COMMENTS 03/19/2015   TRIG 78 03/19/2015   CHOLHDL SEE COMMENTS 03/19/2015     May 2015 TC 185,LDL85,HDL78,TG139  Blood Pressure/HTN- Patient's blood pressure is well controlled today on current regimen that includes ACE-I ( lisinopril).  Pt has FH of DM in mother, T1DM. Retired Nurse, adult.  I have reviewed the patient's past medical history,  medications and allergies.   Current Outpatient Prescriptions on File Prior to Visit  Medication Sig Dispense Refill  . albuterol (PROVENTIL) (2.5  MG/3ML) 0.083% nebulizer solution Inhale 3 mLs into the lungs every 6 (six) hours as needed for wheezing or shortness of breath. 75 mL 2  . allopurinol (ZYLOPRIM) 100 MG tablet Take 100 mg by mouth daily.    [name] amLODipine (NORVASC) 10 MG tablet Take 1 tablet (10 mg total) by mouth daily. 30 tablet 1  . aspirin 81 MG chewable tablet Chew 1 tablet (81 mg total) by mouth daily. 30 tablet 2  . atorvastatin (LIPITOR) 20 MG tablet Take 20 mg by mouth at bedtime.    [name] buPROPion (WELLBUTRIN) 75 MG tablet Take 75 mg by mouth 2 (two) times daily.  0  . clonazePAM (KLONOPIN) 0.5 MG tablet Take 1 tablet (0.5 mg total) by mouth at bedtime as needed. 20 tablet 0  . DULoxetine (CYMBALTA) 60 MG capsule Take 120 mg by mouth daily.  0  . furosemide (LASIX) 40 MG tablet Take 40 mg by mouth 2 (two) times daily.    . furosemide (LASIX) 80 MG tablet Take 80 mg by mouth 4 (four) times a week. Tuesday, Thursday, Saturday, and Sunday.  0  . gabapentin (NEURONTIN) 100 MG capsule Take 100 mg by mouth daily.  1  . hydrALAZINE (APRESOLINE) 25 MG tablet Take 1 tablet (25 mg total) by mouth 3 (three) times daily. 90 tablet 1  . insulin aspart (NOVOLOG) 100 UNIT/ML FlexPen Inject 22 Units into the skin 3 (three) times daily with meals.     [name] LANTUS SOLOSTAR 100 UNIT/ML Solostar Pen Inject 11-18 Units into the skin See admin instructions. Inject 18 units subcutaneous every morning and Inject 11 units subcutaneous every night at bedtime.  1  . metoprolol (LOPRESSOR) 50 MG tablet Take 50 mg by mouth 2 (two) times daily.    . multivitamin (RENA-VIT) TABS tablet Take 1 tablet by mouth daily.    . pantoprazole (PROTONIX) 40 MG tablet Take 1 tablet (40 mg total) by mouth daily. 30 tablet 1  . predniSONE (DELTASONE) 20 MG tablet Take 3 tablets (60 mg total) by mouth daily with breakfast. 90 tablet 0  . SENSIPAR 30 MG tablet Take 30 mg by mouth daily.    . sevelamer carbonate (RENVELA) 800 MG tablet Take 800 mg by mouth 3 (three) times  daily.     No current facility-administered medications on file prior to visit.   Allergies  Allergen Reactions  . Oxycodone-Acetaminophen Nausea And Vomiting     Review of Systems- [ x ]  Complains of    [  ]  denies [  ] Recent weight change [  ]  Fatigue [  ] polydipsia [  ] polyuria [  ]  nocturia [  ]  vision difficulty [  ] chest pain [  ] shortness of breath [  ] leg swelling [  ] cough [  ] nausea/vomiting [  ]  diarrhea [  ] constipation [  ] abdominal pain [  ]  tingling/numbness in extremities [  ]  concern with feet ( wounds/sores)   PE: BP 132/60 mmHg  Pulse 83  Resp 14  Ht 6\' 1"  (1.854 m)  Wt 298 lb 12 oz (135.512 kg)  BMI 39.42 kg/m2  SpO2 97% Wt Readings from Last 3 Encounters:  03/29/15 298 lb 12 oz (135.512 kg)  03/21/15 292 lb 3.2 oz (132.541 kg)  03/15/15 304 lb 7.3 oz (138.1 kg)  Exam: deferred  ASSESSMENT AND PLAN: Problem List Items Addressed This Visit      Cardiovascular and Mediastinum   Essential hypertension    Bp close to target on current regimen.        Endocrine   Type 1 diabetes mellitus - Primary    Discussed with the patient regarding basal/bolus therapy and expected response to high dose steroids.  Due to multiple comorbidities, would aim for less stringent A1c. Last A1c was near target.  Recent sugars have been elevated and I have adjusted his insulins as follows  Patient Instructions   Change insulin as follows;  Blood Glucose (mg/dL)  Breakfast  (Units Novolog Insulin)  Lunch  (Units Novolog Insulin)  Supper  (Units Novolog Insulin)  Nighttime (Units Novolog Insulin)   <70 Treat the low blood sugar.  Recheck blood glucose  in 15 mins. If sugar is more than 70, then take the number of units of insulin in the 70-90 row, if before a meal.   70-90  13  13  13   0  91-130 (Base Dose)  22  22  22   0   131-150  23  23  23   0   151-200  24  24  24   0   201-250  25  25  25  0   251-300  26  26  26  1    301-350  27  27  27 2    351-400  28  28  28  3    401-450  29  29  29 4    >450  30  30  30  5    Lantus insulin 22 units morning and 12 units at night time units subcutaneous      Check sugars 4 x daily ( before each meal and at bedtime).  Record them in a log book and bring that/meter to next appointment.  Please come back for a follow-up appointment in 1 month. Sooner if getting lows.  plesae call us if getting lows.              Genitourinary   CKD (chronic kidney disease) stage V requiring chronic dialysis    Followed by nephrology. Dialysis schedule M/W/F          Other   Proliferative retinopathy    Had opthalmology follow up this month.               - Return to clinic in 4 weeks with sugar log/meter. Explained that [place] am [place] out of [place], and he is going to see when he can resume his usual endocrine follow up with [facility]. In the interim, will set him up for follow up with my colleagues at [facility].   Juriel Cid [facility] 04/02/2015 11:04 AM

## 2015-03-29 NOTE — Patient Instructions (Addendum)
Change insulin as follows;  Blood Glucose (mg/dL)  Breakfast  (Units Novolog Insulin)  Lunch  (Units Novolog Insulin)  Supper  (Units Novolog Insulin)  Nighttime (Units Novolog Insulin)   <70 Treat the low blood sugar.  Recheck blood glucose  in 15 mins. If sugar is more than 70, then take the number of units of insulin in the 70-90 row, if before a meal.   70-90  13  13  13   0  91-130 (Base Dose)  22  22  22   0   131-150  23  23  23   0   151-200  24  24  24   0   201-250  25  25  25  0   251-300  26  26  26 1    301-350  27  27  27 2    351-400  28  28  28  3    401-450  29  29  29 4    >450  30  30  30  5    Lantus insulin 22 units morning and 12 units at night time units subcutaneous      Check sugars 4 x daily ( before each meal and at bedtime).  Record them in a log book and bring that/meter to next appointment.  Please come back for a follow-up appointment in 1 month. Sooner if getting lows.  plesae call us if getting lows.

## 2015-03-29 NOTE — Progress Notes (Signed)
Pre visit review using our clinic review tool, if applicable. No additional management support is needed unless otherwise documented below in the visit note. 

## 2015-04-02 NOTE — Assessment & Plan Note (Signed)
Bp close to target on current regimen.

## 2015-04-02 NOTE — Assessment & Plan Note (Signed)
Followed by nephrology. Dialysis schedule M/W/F

## 2015-04-02 NOTE — Assessment & Plan Note (Signed)
Discussed with the patient regarding basal/bolus therapy and expected response to high dose steroids.  Due to multiple comorbidities, would aim for less stringent A1c. Last A1c was near target.  Recent sugars have been elevated and I have adjusted his insulins as follows  Patient Instructions   Change insulin as follows;  Blood Glucose (mg/dL)  Breakfast  (Units Novolog Insulin)  Lunch  (Units Novolog Insulin)  Supper  (Units Novolog Insulin)  Nighttime (Units Novolog Insulin)   <70 Treat the low blood sugar.  Recheck blood glucose  in 15 mins. If sugar is more than 70, then take the number of units of insulin in the 70-90 row, if before a meal.   70-90  13  13  13   0  91-130 (Base Dose)  22  22  22   0   131-150  23  23  23   0   151-200  24  24  24   0   201-250  25  25  25  0   251-300  26  26  26 1    301-350  27  27  27 2    351-400  28  28  28  3    401-450  29  29  29 4    >450  30  30  30  5    Lantus insulin 22 units morning and 12 units at night time units subcutaneous      Check sugars 4 x daily ( before each meal and at bedtime).  Record them in a log book and bring that/meter to next appointment.  Please come back for a follow-up appointment in 1 month. Sooner if getting lows.  plesae call us if getting lows.

## 2015-04-02 NOTE — Assessment & Plan Note (Signed)
Had opthalmology follow up this month.

## 2015-04-11 ENCOUNTER — Encounter: Payer: Self-pay | Admitting: Family

## 2015-04-11 ENCOUNTER — Ambulatory Visit: Payer: BC Managed Care – PPO | Attending: Family | Admitting: Family

## 2015-04-11 VITALS — BP 169/80 | HR 86 | Resp 20 | Ht 73.0 in | Wt 296.0 lb

## 2015-04-11 DIAGNOSIS — E079 Disorder of thyroid, unspecified: Secondary | ICD-10-CM | POA: Insufficient documentation

## 2015-04-11 DIAGNOSIS — I120 Hypertensive chronic kidney disease with stage 5 chronic kidney disease or end stage renal disease: Secondary | ICD-10-CM | POA: Diagnosis not present

## 2015-04-11 DIAGNOSIS — E785 Hyperlipidemia, unspecified: Secondary | ICD-10-CM | POA: Insufficient documentation

## 2015-04-11 DIAGNOSIS — Z794 Long term (current) use of insulin: Secondary | ICD-10-CM | POA: Insufficient documentation

## 2015-04-11 DIAGNOSIS — J8489 Other specified interstitial pulmonary diseases: Secondary | ICD-10-CM | POA: Insufficient documentation

## 2015-04-11 DIAGNOSIS — E109 Type 1 diabetes mellitus without complications: Secondary | ICD-10-CM | POA: Insufficient documentation

## 2015-04-11 DIAGNOSIS — I10 Essential (primary) hypertension: Secondary | ICD-10-CM

## 2015-04-11 DIAGNOSIS — I509 Heart failure, unspecified: Secondary | ICD-10-CM | POA: Insufficient documentation

## 2015-04-11 DIAGNOSIS — N2581 Secondary hyperparathyroidism of renal origin: Secondary | ICD-10-CM | POA: Diagnosis not present

## 2015-04-11 DIAGNOSIS — N186 End stage renal disease: Secondary | ICD-10-CM | POA: Diagnosis not present

## 2015-04-11 DIAGNOSIS — G4733 Obstructive sleep apnea (adult) (pediatric): Secondary | ICD-10-CM | POA: Insufficient documentation

## 2015-04-11 DIAGNOSIS — R0602 Shortness of breath: Secondary | ICD-10-CM | POA: Diagnosis not present

## 2015-04-11 DIAGNOSIS — I5032 Chronic diastolic (congestive) heart failure: Secondary | ICD-10-CM

## 2015-04-11 MED ORDER — ALLOPURINOL 100 MG PO TABS: 100.0000 mg | ORAL_TABLET | Freq: Every day | ORAL | Status: DC

## 2015-04-11 MED ORDER — ATORVASTATIN CALCIUM 20 MG PO TABS: 20.0000 mg | ORAL_TABLET | Freq: Every day | ORAL | Status: DC

## 2015-04-11 MED ORDER — FUROSEMIDE 80 MG PO TABS: 80.0000 mg | ORAL_TABLET | ORAL | Status: DC

## 2015-04-11 NOTE — Patient Instructions (Signed)
Begin weighing daily and call for an overnight weight gain of > 2 pounds or a weekly weight gain of >5 pounds. 

## 2015-04-11 NOTE — Progress Notes (Signed)
Subjective:    Patient ID: Ryan Arnold, male    DOB: 12-24-1960, 54 y.o.   MRN: [id number]  Shortness of Breath This is a chronic problem. The current episode started more than 1 month ago. The problem occurs daily. The problem has been unchanged. Associated symptoms include headaches and leg swelling. Pertinent negatives include no abdominal pain, chest pain, neck pain, PND, rash, sore throat or wheezing. The symptoms are aggravated by any activity. The patient has no known risk factors for DVT/PE. He has tried oral steroids for the symptoms. The treatment provided mild relief. His past medical history is significant for bronchiolitis and a heart failure.  Congestive Heart Failure Presents for initial visit. The disease course has been stable. Associated symptoms include edema, fatigue and shortness of breath (easily short of breath). Pertinent negatives include no abdominal pain, chest pain, orthopnea or palpitations. The symptoms have been stable. Past treatments include beta blockers, salt and fluid restriction and oxygen. The treatment provided moderate relief. Compliance with prior treatments has been good. His past medical history is significant for DM and HTN.    Past Medical History  Diagnosis Date  . Essential hypertension   . Hyperlipemia   . End stage renal disease     a. on dialysis; still makes urine.  [name] BOOP (bronchiolitis obliterans with organizing pneumonia)     a. 01/2018 s/p R thoracoscopy/Bx confirming BOOP;  b. 02/2018 high dose steroids started.  . Chronic diastolic CHF (congestive heart failure)     a. 12/2014 Echo: EF 50-55%, mild conc LVH, mildly dil LA.  . Morbid obesity   . Secondary hyperparathyroidism   . Anemia of chronic disease   . Recurrent pneumonia     a. 2/2 BOOP.  [name] Chest pain     a. 06/2014 Myoview ([facility]) no ischemia/infarct, nl EF.  . Type 1 diabetes mellitus   . Thyroid disease   . OSA treated with BiPAP     History  Substance Use Topics  .  Smoking status: Never Smoker   . Smokeless tobacco: Never Used  . Alcohol Use: No    Allergies  Allergen Reactions  . Oxycodone-Acetaminophen Nausea And Vomiting    Prior to Admission medications   Medication Sig Start Date End Date Taking? Authorizing Provider  albuterol (PROVENTIL) (2.5 MG/3ML) 0.083% nebulizer solution Inhale 3 mLs into the lungs every 6 (six) hours as needed for wheezing or shortness of breath. 03/21/15  Yes Enid Baas, MD  allopurinol (ZYLOPRIM) 100 MG tablet Take 1 tablet (100 mg total) by mouth daily. 04/11/15  Yes Delma Freeze, FNP  amLODipine (NORVASC) 10 MG tablet Take 1 tablet (10 mg total) by mouth daily. 03/21/15  Yes Enid Baas, MD  aspirin 81 MG chewable tablet Chew 1 tablet (81 mg total) by mouth daily. 03/21/15  Yes Enid Baas, MD  buPROPion (WELLBUTRIN) 75 MG tablet Take 75 mg by mouth 2 (two) times daily. 01/02/15  Yes Historical Provider, MD  clonazePAM (KLONOPIN) 0.5 MG tablet Take 1 tablet (0.5 mg total) by mouth at bedtime as needed. 03/21/15 06/04/15 Yes Enid Baas, MD  DULoxetine (CYMBALTA) 60 MG capsule Take 120 mg by mouth daily. 01/02/15  Yes Historical Provider, MD  furosemide (LASIX) 40 MG tablet Take 40 mg by mouth 2 (two) times daily. 01/12/15  Yes Historical Provider, MD  gabapentin (NEURONTIN) 100 MG capsule Take 100 mg by mouth daily. 12/26/14  Yes Historical Provider, MD  hydrALAZINE (APRESOLINE) 25 MG tablet Take 1 tablet (  25 mg total) by mouth 3 (three) times daily. 03/21/15  Yes Vipul Sherryll Burger, MD  insulin aspart (NOVOLOG) 100 UNIT/ML FlexPen Inject 22 Units into the skin 3 (three) times daily with meals.  05/25/14  Yes Historical Provider, MD  LANTUS SOLOSTAR 100 UNIT/ML Solostar Pen Inject 11-18 Units into the skin See admin instructions. Inject 18 units subcutaneous every morning and Inject 11 units subcutaneous every night at bedtime. 12/18/14  Yes Historical Provider, MD  metoprolol (LOPRESSOR) 50 MG tablet Take 50 mg by mouth  2 (two) times daily. 12/23/14  Yes Historical Provider, MD  multivitamin (RENA-VIT) TABS tablet Take 1 tablet by mouth daily. 01/09/14  Yes Historical Provider, MD  pantoprazole (PROTONIX) 40 MG tablet Take 1 tablet (40 mg total) by mouth daily. 03/21/15  Yes Vipul Sherryll Burger, MD  predniSONE (DELTASONE) 20 MG tablet Take 3 tablets (60 mg total) by mouth daily with breakfast. 03/21/15  Yes Vipul Sherryll Burger, MD  SENSIPAR 30 MG tablet Take 30 mg by mouth daily. 12/18/14  Yes Historical Provider, MD  sevelamer carbonate (RENVELA) 800 MG tablet Take 800 mg by mouth 3 (three) times daily. 03/29/14  Yes Historical Provider, MD  atorvastatin (LIPITOR) 20 MG tablet Take 1 tablet (20 mg total) by mouth at bedtime. 04/11/15   Delma Freeze, FNP  furosemide (LASIX) 80 MG tablet Take 1 tablet (80 mg total) by mouth 4 (four) times a week. Tuesday, Thursday, Saturday, and Sunday. 04/11/15   Delma Freeze, FNP     Review of Systems  Constitutional: Positive for fatigue. Negative for appetite change.  HENT: Negative for congestion, postnasal drip and sore throat.   Eyes: Negative.   Respiratory: Positive for shortness of breath (easily short of breath). Negative for cough, chest tightness and wheezing.   Cardiovascular: Positive for leg swelling. Negative for chest pain, palpitations and PND.  Gastrointestinal: Negative for abdominal pain and abdominal distention.  Endocrine: Negative.   Genitourinary: Negative for dysuria and frequency.  Musculoskeletal: Negative for back pain and neck pain.  Skin: Negative.  Negative for rash.  Allergic/Immunologic: Negative.   Neurological: Positive for numbness (tingling in legs) and headaches. Negative for dizziness.  Hematological: Negative for adenopathy. Does not bruise/bleed easily.  Psychiatric/Behavioral: Negative for sleep disturbance (sleeping on 2 pillows with bipap). The patient is not nervous/anxious.        Objective:   Physical Exam  Constitutional: He is oriented to  person, place, and time. He appears well-developed and well-nourished.  HENT:  Head: Normocephalic and atraumatic.  Eyes: Conjunctivae are normal. Pupils are equal, round, and reactive to light.  Neck: Normal range of motion. Neck supple.  Cardiovascular: Normal rate and regular rhythm.   Pulmonary/Chest: Effort normal and breath sounds normal. He has no wheezes. He has no rales.  Abdominal: Soft. He exhibits no distension. There is no tenderness.  Musculoskeletal: He exhibits edema (3+ pitting edema in bilateral lower legs). He exhibits no tenderness.  Neurological: He is alert and oriented to person, place, and time.  Skin: Skin is warm and dry.  Psychiatric: He has a normal mood and affect. His behavior is normal.  Nursing note and vitals reviewed.   BP 169/80 mmHg  Pulse 86  Resp 20  Ht 6\' 1"  (1.854 m)  Wt 296 lb (134.265 kg)  BMI 39.06 kg/m2  SpO2 100%        Assessment & Plan:  1: Chronic heart failure with preserved ejection fraction- Patient presents with shortness of breath and fatigue with little  exertion. He says that he was short of breath upon walking into the office. When he experiences symptoms, he will stop what he's doing to rest until symptoms resolve. Does wear oxygen at 3L with exertion and at bedtime in addition to bipap. He hasn't been weighing daily due to not having scales so a set was given to him today. Discussed the importance of weighing daily first thing in the morning after using the bathroom and to write the weight down on the weight chart and bring weight chart in for review. He was instructed to call for an overnight weight gain of >2 pounds or a weekly weight gain of >5 pounds. He is not adding any salt to his foods and tries to follow a low sodium diet. Reviewed how to read food labels and the importance of following a 2000mg  sodium diet. Written information was given to him about this.  2: HTN- Blood pressure mildly elevated but he says that at  dialysis it was "good". Continue to monitor. 3: Diabetes- He says that his fasting glucose level was 167 and his most recent A1C was "normal". 4: Renal insufficiency- Currently goes to dialysis on M, W, F.    Return in one month or sooner for any questions/problems before the next office visit.

## 2015-04-12 ENCOUNTER — Ambulatory Visit (INDEPENDENT_AMBULATORY_CARE_PROVIDER_SITE_OTHER): Payer: BC Managed Care – PPO | Admitting: Internal Medicine

## 2015-04-12 ENCOUNTER — Encounter: Payer: Self-pay | Admitting: Internal Medicine

## 2015-04-12 ENCOUNTER — Telehealth: Payer: Self-pay | Admitting: *Deleted

## 2015-04-12 VITALS — BP 136/68 | HR 64 | Temp 98.3°F | Ht 73.0 in | Wt 299.0 lb

## 2015-04-12 DIAGNOSIS — G4733 Obstructive sleep apnea (adult) (pediatric): Secondary | ICD-10-CM | POA: Diagnosis not present

## 2015-04-12 DIAGNOSIS — J8489 Other specified interstitial pulmonary diseases: Secondary | ICD-10-CM | POA: Diagnosis not present

## 2015-04-12 NOTE — Assessment & Plan Note (Addendum)
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  Plan: - continue with Prednisone 60mg  (3pills) until July 4th, starting July 5th take Prednisone 50 mg (2.5pills) daily until follow up  - Anticipate steroid treatment for 6-9 months (total). If there is another relapse of BOOP, will have to consider another bronchoscopy to rule out atypical infections exacerbating BOOP. - continue with 3L O2 with exertion

## 2015-04-12 NOTE — Progress Notes (Signed)
MRN# [id number] Ryan Arnold 1961-03-23   CC: Chief Complaint  Patient presents with  . Follow-up    Pt here for f/u: pt states sob still occurs with exertion. Pt on 3 L 02 with exertion. He was hosptialized for 5 days June 5-9 due to sob/fluid retention.      Brief History: synopsis: 54 year old male past medical history of end-stage renal disease on dialysis, uncontrolled diabetes, hypertension, former retired Emergency planning/management officer, seen in consultation for recurrent pneumonias. Multiple hospitalizations at [facility] and at [facility] over the past one year for bilateral groundglass opacities throughout entire lung fields requiring multiple rounds of antibiotics and steroids. Had a bronchoscopy done at [facility] and within the last 2 years no acute findings from the BAL. Patient with recent bronchoscopy, March 2016, at [facility] by Dr. Dema Arnold, BAL with no acute findings. April 2016 surgical lung bx (wedge) by Dr. Thelma Arnold, path confirms BOOP, started on high dose steroids.   HPI March 2016 Patient is a pleasant 54 year old male presents today for hospital followup. Patient has a past medical history of end stage renal disease on dialysis, diabetes, hypertension, renal osteodystrophy, diastolic heart failure. Patient was seen in the hospital for recurrent pneumonias from March 1 to March 7, and during that time he had a bronchoscopy with BAL. Given his recurrent bouts of pneumonia over the past one year and being seen at [facility] and by [facility] over the past one year with noted ilateral groundglass opacities throughout the entire lung fields on chest CT and he was further evaluated by Dr. Thelma Arnold ([facility]) for surgical lung biopsy, however given the patient was improving patient declined further surgical lung biopsy at that time. See below for full details from the hospital consult note on 12/13/2014. During this hospitalization patient also had a vasculitis panel which was negative. Patient does was readmitted since I  last saw him at the beginning of March, again to [facility] last week for hypoxia shortness of breath and atypical chest pain. He was again diagnosed with recurrent bilateral pneumonia (HCAP), acute on chronic diastolic heart failure. During this recent hospitalization he was treated for fluid overload, pneumonia, after receiving BiPAP and dialysis his breathing improved, he was prophylactically placed on antibiotics given the results of his CT scan which again showed bilateral upper lobe pneumonia.  Off note, he has had a bronchoscopy at [facility] in past 1.5 years, per the patient he said that there was no acute findings from that. Today patient states that he still has a mild dry cough, his shortness of breath has improved but he is concerned that since being off antibiotics his "pneumonia" may return. He was discharged this time from [facility] with 2 L of oxygen as needed, currently only wearing oxygen when needed - if out walking, or performing chores, which he subjectively judges his O2 needs.  His PMD ([facility] - Dr. Arty Baumgartner) ordered a sleep study for him, which he performed on 01/13/15, per patient he will require CPAP therapy.  Plan - referral to Dr. Thelma Arnold for surgical lung bx.   ROV 03/01/15: Patient resents today for follow-up visit of his recent open lung biopsy. He had a recent ED visit for chest pain and sob Prior to starting his 6 minute walk test today he was complaining of intense shortness of breath along with chest discomfort, he was sent for a chest x-ray would like to discuss the results of it. Today he is also accompanied by his wife. He also endorses cough.  Plan: - Prednisone  daily with breakfast until follow up visit - Anticipate steroid treatment for 6-9 months. If there is another relapse of BOOP, will have to consider another bronchoscopy to rule out atypical infections exacerbating BOOP. - continue with 3L O2 with exertion - Referral to see an Endocrinologist (Dr. Welford Arnold)  to help with Diabetes control while on high dose steroids.   Events since last clinic visit: Patient presents today for a follow up of his BOOP, currently on  Prednisone. Stated that he has fine "pimples" on his face and head since being on steroid.  Recent hospitalization for dCHF exacerbation, during that visit is steroids for BOOP was reduced from  daily to 60 mg daily.  Currently doing well, wearing 3L O2 with exertion. Walking from check-in area to patient room desaturated to the 70s (this was done without supplemental oxygen), and quickly rebounded to 92% saturation with rest and 3L. Following with [facility] (Dr. Chancy Arnold), currently on bipap at recent titration.  During split study was noted to have irregular "heart beat" and he was referred to cardiology for possible holter monitoring.    Recent [facility] Hospitalization reviewed by Dr. Dema Arnold DATE OF BIRTH: 1961-09-29  DATE OF ADMISSION: 6/5/2016ADMITTING PHYSICIAN: Ryan Saran, MD  DATE OF DISCHARGE: 03/21/15  PRIMARY CARE PHYSICIAN: Ryan Acre, MD    ADMISSION DIAGNOSIS:  Acute respiratory distress [J80] CKD (chronic kidney disease) stage V requiring chronic dialysis [N18.6, Z99.2] Acute on chronic congestive heart failure, unspecified congestive heart failure type [I50.9]  DISCHARGE DIAGNOSIS:  Principal Problem:  Acute on chronic diastolic CHF (congestive heart failure), NYHA class 3 Active Problems:  OSA (obstructive sleep apnea)  BOOP (bronchiolitis obliterans with organizing pneumonia)  Type 1 diabetes mellitus  Anemia of chronic disease  Secondary hyperparathyroidism  Morbid obesity  End stage renal disease  Hyperlipemia  Essential hypertension  Chest pain  Hyperkalemia   SECONDARY DIAGNOSIS:   Past Medical History  Diagnosis Date  . Essential hypertension   . Hyperlipemia   . End stage renal disease     a. on dialysis; still makes urine.  [name]  BOOP (bronchiolitis obliterans with organizing pneumonia)     a. 01/2018 s/p R thoracoscopy/Bx confirming BOOP; b. 02/2018 high dose steroids started.  . Chronic diastolic CHF (congestive heart failure)     a. 12/2014 Echo: EF 50-55%, mild conc LVH, mildly dil LA.  . Morbid obesity   . Secondary hyperparathyroidism   . Anemia of chronic disease   . Recurrent pneumonia     a. 2/2 BOOP.  [name] Chest pain     a. 06/2014 Myoview ([facility]) no ischemia/infarct, nl EF.  . Type 1 diabetes mellitus     HOSPITAL COURSE:   53y/o M with ESRD on HD, BOOP on steroids orally, admitted for acute resp distress and noted to have pulmonary edema.  #1 acute pulmonary edema-acute on chronic diastolic CHF.  -Echo done in March 2016, normal EF, diastolic dysfunction noted -appreciate cardiology consult - Discussed with Dr. Dema Arnold and reduced prednisone dose for now- as causing worsening of fluid retention and heart failure -Continue Lasix and also patient on dialysis. - outpatient ischemia work up per cardiology  #2 end-stage renal disease on hemodialysis-patient on Monday Wednesday Friday hemodialysis. Nephrology consulted. - Dialysis today per schedule.[name] Discharge today after dialysis.  # 3- OSA- CPAP at nights  #4 BOOP- being managed by Dr. Dema Arnold as outpatient. On high-dose oral steroids. Steroids dose reduced for now as causing worsening of diastolic heart failure.  #  5 Leukocytosis- secondary to high dose steroids, also likely pneumonia, as noted on CXR.could be from BOOP changes. Started levaquin x 7 days.   #6 Depression/anxiety- cont home meds  #7 HTN- home meds- BP elevated- metoprolol, norvasc        Medication:   Current Outpatient Rx  Name  Route  Sig  Dispense  Refill  . albuterol (PROVENTIL) (2.5 MG/3ML) 0.083% nebulizer solution   Inhalation   Inhale 3 mLs into the lungs every 6 (six) hours as needed for wheezing or shortness of breath.   75 mL    2   . allopurinol (ZYLOPRIM) 100 MG tablet   Oral   Take 1 tablet (100 mg total) by mouth daily.   90 tablet   1   . amLODipine (NORVASC) 10 MG tablet   Oral   Take 1 tablet (10 mg total) by mouth daily.   30 tablet   1   . aspirin 81 MG chewable tablet   Oral   Chew 1 tablet (81 mg total) by mouth daily.   30 tablet   2   . atorvastatin (LIPITOR) 20 MG tablet   Oral   Take 1 tablet (20 mg total) by mouth at bedtime.   90 tablet   3   . buPROPion (WELLBUTRIN) 75 MG tablet   Oral   Take 75 mg by mouth 2 (two) times daily.      0   . clonazePAM (KLONOPIN) 0.5 MG tablet   Oral   Take 1 tablet (0.5 mg total) by mouth at bedtime as needed.   20 tablet   0   . DULoxetine (CYMBALTA) 60 MG capsule   Oral   Take 120 mg by mouth daily.      0   . furosemide (LASIX) 40 MG tablet   Oral   Take 40 mg by mouth 2 (two) times daily.         . furosemide (LASIX) 80 MG tablet   Oral   Take 1 tablet (80 mg total) by mouth 4 (four) times a week. Tuesday, Thursday, Saturday, and Sunday.   36 tablet   3   . gabapentin (NEURONTIN) 100 MG capsule   Oral   Take 100 mg by mouth daily.      1   . hydrALAZINE (APRESOLINE) 25 MG tablet   Oral   Take 1 tablet (25 mg total) by mouth 3 (three) times daily.   90 tablet   1   . insulin aspart (NOVOLOG) 100 UNIT/ML FlexPen   Subcutaneous   Inject 22 Units into the skin 3 (three) times daily with meals.          [name] LANTUS SOLOSTAR 100 UNIT/ML Solostar Pen   Subcutaneous   Inject 11-18 Units into the skin See admin instructions. Inject 18 units subcutaneous every morning and Inject 11 units subcutaneous every night at bedtime.      1     Dispense as written.   . metoprolol (LOPRESSOR) 50 MG tablet   Oral   Take 50 mg by mouth 2 (two) times daily.         . multivitamin (RENA-VIT) TABS tablet   Oral   Take 1 tablet by mouth daily.         . pantoprazole (PROTONIX) 40 MG tablet   Oral   Take 1 tablet (40 mg  total) by mouth daily.   30 tablet   1   . predniSONE (DELTASONE) 20 MG  tablet   Oral   Take 3 tablets (60 mg total) by mouth daily with breakfast.   90 tablet   0   . SENSIPAR 30 MG tablet   Oral   Take 30 mg by mouth daily.           Dispense as written.   . sevelamer carbonate (RENVELA) 800 MG tablet   Oral   Take 800 mg by mouth 3 (three) times daily.            Review of Systems: Gen:  Denies  fever, sweats, chills HEENT: Denies blurred vision, double vision, ear pain, eye pain, hearing loss, nose bleeds, sore throat Cvc:  No dizziness, chest pain or heaviness Resp:   Admits NU:[id number]:dyspne, mild cough, not worsening Gi: Denies swallowing difficulty, stomach pain, nausea or vomiting, diarrhea, constipation, bowel incontinence Gu:  Denies bladder incontinence, burning urine Ext:   No Joint pain, stiffness or swelling Skin: No skin rash, easy bruising or bleeding or hives Endoc:  No polyuria, polydipsia , polyphagia or weight change Other:  All other systems negative  Allergies:  Oxycodone-acetaminophen  Physical Examination:  VS: BP 136/68 mmHg  Pulse 64  Temp(Src) 98.3 F (36.8 C) (Oral)  Ht 6\' 1"  (1.854 m)  Wt 299 lb (135.626 kg)  BMI 39.46 kg/m2  SpO2 92%  General Appearance: No distress  HEENT: PERRLA, no ptosis, no other lesions noticed Pulmonary:normal breath sounds., diaphragmatic excursion normal.No wheezing, No rales   Cardiovascular:  Normal S1,S2.  No m/r/g.     Abdomen:Exam: Benign, Soft, non-tender, No masses  Skin:   warm, no ecchymosis, fine milia appearance lesions on the face and forehead (no erythema, no eruption) Extremities: normal, no cyanosis, clubbing, warm with normal capillary refill.      Rad results: (The following images and results were reviewed by Dr. Dema SeverinMungal). CXR 03/19/15 CHEST 2 VIEW  COMPARISON: 03/18/2015  FINDINGS: Progression of bilateral airspace disease left greater than right. Cardiac enlargement. Right pleural  effusion is small and unchanged. No significant effusion on the left. No pneumothorax.  IMPRESSION: Progression of bilateral airspace disease left greater than right. Differential includes pulmonary infection and pulmonary edema. Pulmonary hemorrhage also a possibility.    Assessment and Plan:53 yo male seen in follow up for BOOP.  OSA (obstructive sleep apnea) Currently being worked up by primary care physician. Completed overnight polysomnography on 01/13/2015. Currently following with [facility]      BOOP (bronchiolitis obliterans with organizing pneumonia) BOOP - surgical biopsy proven Unknown trigger.  Plan: - continue with Prednisone 60mg  (3pills) until July 4th, starting July 5th take Prednisone 50 mg (2.5pills) daily until follow up  - Anticipate steroid treatment for 6-9 months (total). If there is another relapse of BOOP, will have to consider another bronchoscopy to rule out atypical infections exacerbating BOOP. - continue with 3L O2 with exertion       Updated Medication List Outpatient Encounter Prescriptions as of 04/12/2015  Medication Sig  . albuterol (PROVENTIL) (2.5 MG/3ML) 0.083% nebulizer solution Inhale 3 mLs into the lungs every 6 (six) hours as needed for wheezing or shortness of breath.  . allopurinol (ZYLOPRIM) 100 MG tablet Take 1 tablet (100 mg total) by mouth daily.  [name]. amLODipine (NORVASC) 10 MG tablet Take 1 tablet (10 mg total) by mouth daily.  [name]. aspirin 81 MG chewable tablet Chew 1 tablet (81 mg total) by mouth daily.  [name]. atorvastatin (LIPITOR) 20 MG tablet Take 1 tablet (20 mg total) by mouth  at bedtime.  [name] buPROPion (WELLBUTRIN) 75 MG tablet Take 75 mg by mouth 2 (two) times daily.  . clonazePAM (KLONOPIN) 0.5 MG tablet Take 1 tablet (0.5 mg total) by mouth at bedtime as needed.  . DULoxetine (CYMBALTA) 60 MG capsule Take 120 mg by mouth daily.  . furosemide (LASIX) 40 MG tablet Take 40 mg by mouth 2 (two) times daily.  . furosemide  (LASIX) 80 MG tablet Take 1 tablet (80 mg total) by mouth 4 (four) times a week. Tuesday, Thursday, Saturday, and Sunday.  . gabapentin (NEURONTIN) 100 MG capsule Take 100 mg by mouth daily.  . hydrALAZINE (APRESOLINE) 25 MG tablet Take 1 tablet (25 mg total) by mouth 3 (three) times daily.  . insulin aspart (NOVOLOG) 100 UNIT/ML FlexPen Inject 22 Units into the skin 3 (three) times daily with meals.   [name] LANTUS SOLOSTAR 100 UNIT/ML Solostar Pen Inject 11-18 Units into the skin See admin instructions. Inject 18 units subcutaneous every morning and Inject 11 units subcutaneous every night at bedtime.  . metoprolol (LOPRESSOR) 50 MG tablet Take 50 mg by mouth 2 (two) times daily.  . multivitamin (RENA-VIT) TABS tablet Take 1 tablet by mouth daily.  . pantoprazole (PROTONIX) 40 MG tablet Take 1 tablet (40 mg total) by mouth daily.  . predniSONE (DELTASONE) 20 MG tablet Take 3 tablets (60 mg total) by mouth daily with breakfast.  . SENSIPAR 30 MG tablet Take 30 mg by mouth daily.  . sevelamer carbonate (RENVELA) 800 MG tablet Take 800 mg by mouth 3 (three) times daily.   No facility-administered encounter medications on file as of 04/12/2015.    Orders for this visit: No orders of the defined types were placed in this encounter.    Thank  you for the visitation and for allowing  [facility] to assist in the care of your patient. Our recommendations are noted above.  Please contact us if we can be of further service.  Ryan Acre, MD [facility] Office Number: [phone]

## 2015-04-12 NOTE — Assessment & Plan Note (Signed)
Currently being worked up by primary care physician. Completed overnight polysomnography on 01/13/2015. [facility]

## 2015-04-12 NOTE — Patient Instructions (Signed)
Follow up with Dr. Dema SeverinMungal in 6 wks - continue with Prednisone 60mg  (3pills) until July 4th, starting July 5th take Prednisone 50 mg (2.5pills) daily until follow up  - cont with diet, wt loss and exercise - cont with your bipap therapy - please obtain a copy of your recent sleep study and bring to next visit, or fax to our clinic.  - cont with 3L supplemental oxygen with exertion and at night.

## 2015-04-12 NOTE — Telephone Encounter (Signed)
Pt saw Dr.Mungal today  and pt mentioned to Dr that he needs a refill on below:    1. Which medications need to be refilled? Hydralazine   2. Which pharmacy is medication to be sent to? [facility]  3. Do they need a 30 day or 90 day supply? 90   4. Would they like a call back once the medication has been sent to the pharmacy? No

## 2015-04-13 MED ORDER — HYDRALAZINE HCL 25 MG PO TABS: 25.0000 mg | ORAL_TABLET | Freq: Three times a day (TID) | ORAL | Status: DC

## 2015-04-13 NOTE — Telephone Encounter (Signed)
Refill sent for Hydralazine.  

## 2015-04-23 ENCOUNTER — Ambulatory Visit (INDEPENDENT_AMBULATORY_CARE_PROVIDER_SITE_OTHER): Payer: BC Managed Care – PPO | Admitting: Cardiovascular Disease

## 2015-04-23 ENCOUNTER — Encounter: Payer: Self-pay | Admitting: Cardiovascular Disease

## 2015-04-23 VITALS — BP 138/68 | HR 90 | Ht 73.0 in | Wt 294.5 lb

## 2015-04-23 DIAGNOSIS — R0602 Shortness of breath: Secondary | ICD-10-CM | POA: Diagnosis not present

## 2015-04-23 DIAGNOSIS — I5032 Chronic diastolic (congestive) heart failure: Secondary | ICD-10-CM

## 2015-04-23 DIAGNOSIS — I493 Ventricular premature depolarization: Secondary | ICD-10-CM

## 2015-04-23 DIAGNOSIS — I10 Essential (primary) hypertension: Secondary | ICD-10-CM

## 2015-04-23 NOTE — Assessment & Plan Note (Signed)
The patient was noted to have a 4 beat run of wide-complex tachycardia and PVCs during sleep study. Thus, I requested a 48-hour Holter monitor for evaluation.

## 2015-04-23 NOTE — Assessment & Plan Note (Signed)
Blood pressure is controlled on current medications. 

## 2015-04-23 NOTE — Progress Notes (Signed)
HPI  This is a 54 year old African-American male who is here today for a follow-up visit regarding chronic diastolic heart failure. He has known history of end-stage renal disease, hypertension, BOOP , hyperlipidemia and obesity. He was hospitalized in June for worsening fluid overload after he was started on high-dose prednisone for his lung disease. [facility] with a pharmacologic nuclear stress test in September 2015 which showed no evidence of ischemia with normal ejection fraction. Echocardiogram in March showed normal LV systolic function with mild left ventricular hypertrophy. He had a sleep study done also recently which showed evidence of severe sleep apnea with a 4 beat run of wide-complex tachycardia. The patient had effective fluid removal with dialysis during hospitalization. The dose of prednisone was decreased to 50 mg once daily. He had gradual improvement since then. He had atypical chest pain which was not felt to be anginal. He is feeling better overall. He denies syncope or presyncope.  Allergies  Allergen Reactions  . Oxycodone-Acetaminophen Nausea And Vomiting     Current Outpatient Prescriptions on File Prior to Visit  Medication Sig Dispense Refill  . allopurinol (ZYLOPRIM) 100 MG tablet Take 1 tablet (100 mg total) by mouth daily. 90 tablet 1  . amLODipine (NORVASC) 10 MG tablet Take 1 tablet (10 mg total) by mouth daily. 30 tablet 1  . aspirin 81 MG chewable tablet Chew 1 tablet (81 mg total) by mouth daily. 30 tablet 2  . atorvastatin (LIPITOR) 20 MG tablet Take 1 tablet (20 mg total) by mouth at bedtime. 90 tablet 3  . buPROPion (WELLBUTRIN) 75 MG tablet Take 75 mg by mouth 2 (two) times daily.  0  . clonazePAM (KLONOPIN) 0.5 MG tablet Take 1 tablet (0.5 mg total) by mouth at bedtime as needed. 20 tablet 0  . DULoxetine (CYMBALTA) 60 MG capsule Take 120 mg by mouth daily.  0  . furosemide (LASIX) 80 MG tablet Take 1 tablet (80 mg total) by  mouth 4 (four) times a week. Tuesday, Thursday, Saturday, and Sunday. 36 tablet 3  . gabapentin (NEURONTIN) 100 MG capsule Take 100 mg by mouth daily.  1  . hydrALAZINE (APRESOLINE) 25 MG tablet Take 1 tablet (25 mg total) by mouth 3 (three) times daily. 90 tablet 1  . insulin aspart (NOVOLOG) 100 UNIT/ML FlexPen Inject 22 Units into the skin 3 (three) times daily with meals.     [name]. LANTUS SOLOSTAR 100 UNIT/ML Solostar Pen Inject 11-18 Units into the skin See admin instructions. Inject 18 units subcutaneous every morning and Inject 11 units subcutaneous every night at bedtime.  1  . metoprolol (LOPRESSOR) 50 MG tablet Take 50 mg by mouth 2 (two) times daily.    . multivitamin (RENA-VIT) TABS tablet Take 1 tablet by mouth daily.    . pantoprazole (PROTONIX) 40 MG tablet Take 1 tablet (40 mg total) by mouth daily. 30 tablet 1  . predniSONE (DELTASONE) 20 MG tablet Take 3 tablets (60 mg total) by mouth daily with breakfast. 90 tablet 0  . SENSIPAR 30 MG tablet Take 30 mg by mouth daily.    . sevelamer carbonate (RENVELA) 800 MG tablet Take 800 mg by mouth 3 (three) times daily.     No current facility-administered medications on file prior to visit.     Past Medical History  Diagnosis Date  . Essential hypertension   . Hyperlipemia   . End stage renal disease     a. on dialysis; still makes urine.  .[name]  BOOP (bronchiolitis obliterans with organizing pneumonia)     a. 01/2018 s/p R thoracoscopy/Bx confirming BOOP;  b. 02/2018 high dose steroids started.  . Chronic diastolic CHF (congestive heart failure)     a. 12/2014 Echo: EF 50-55%, mild conc LVH, mildly dil LA.  . Morbid obesity   . Secondary hyperparathyroidism   . Anemia of chronic disease   . Recurrent pneumonia     a. 2/2 BOOP.  [name] Chest pain     a. 06/2014 Myoview ([facility]) no ischemia/infarct, nl EF.  . Type 1 diabetes mellitus   . Thyroid disease   . OSA treated with BiPAP      Past Surgical History  Procedure Laterality Date  .  Cataract Bilateral   . Shoulder surgery Left   . Knee surgery Right   . Gallbladder surgery    . Lung biopsy       Family History  Problem Relation Age of Onset  . Diabetes Mother     died @ 76.  [name] Hypertension Mother      History   Social History  . Marital Status: Married    Spouse Name: N/A  . Number of Children: N/A  . Years of Education: N/A   Occupational History  . Not on file.   Social History Main Topics  . Smoking status: Never Smoker   . Smokeless tobacco: Never Used  . Alcohol Use: No  . Drug Use: No  . Sexual Activity: Not on file   Other Topics Concern  . Not on file   Social History Narrative   Retired Emergency planning/management officer.  Does not routinely exercise.      PHYSICAL EXAM   BP 138/68 mmHg  Pulse 90  Ht  (1.854 m)  Wt 294 lb 8 oz (133.584 kg)  BMI 38.86 kg/m2 Constitutional: He is oriented to person, place, and time. He appears well-developed and well-nourished. No distress.  HENT: No nasal discharge.  Head: Normocephalic and atraumatic.  Eyes: Pupils are equal and round.  No discharge. Neck: Normal range of motion. Neck supple. No JVD present. No thyromegaly present.  Cardiovascular: Normal rate, regular rhythm, normal heart sounds. Exam reveals no gallop and no friction rub. No murmur heard.  Pulmonary/Chest: Effort normal and breath sounds normal. No stridor. No respiratory distress. He has no wheezes. He has no rales. He exhibits no tenderness.  Abdominal: Soft. Bowel sounds are normal. He exhibits no distension. There is no tenderness. There is no rebound and no guarding.  Musculoskeletal: Normal range of motion. He exhibits no edema and no tenderness.  Neurological: He is alert and oriented to person, place, and time. Coordination normal.  Skin: Skin is warm and dry. No rash noted. He is not diaphoretic. No erythema. No pallor.  Psychiatric: He has a normal mood and affect. His behavior is normal. Judgment and thought content normal.        ZOX:WRUEA  Rhythm  -Left axis.   ABNORMAL    ASSESSMENT AND PLAN

## 2015-04-23 NOTE — Patient Instructions (Addendum)
Medication Instructions:  Your physician recommends that you continue on your current medications as directed. Please refer to the Current Medication list given to you today.   Labwork: none  Testing/Procedures: Your physician has recommended that you wear a 48 hour holter monitor. Holter monitors are medical devices that record the heart's electrical activity. Doctors most often use these monitors to diagnose arrhythmias. Arrhythmias are problems with the speed or rhythm of the heartbeat. The monitor is a small, portable device. You can wear one while you do your normal daily activities. This is usually used to diagnose what is causing palpitations/syncope (passing out).   You may obtain Holter monitor from Cardiopulmonary in the [facility]  Follow-Up: Your physician wants you to follow-up in: six months with Dr. Kirke CorinArida.  You will receive a reminder letter in the mail two months in advance. If you don't receive a letter, please call our office to schedule the follow-up appointment.   Any Other Special Instructions Will Be Listed Below (If Applicable).

## 2015-04-23 NOTE — Assessment & Plan Note (Signed)
The patient's recent worsening of fluid overload was likely due to high-dose prednisone. This has subsequently improved after decreasing the dose. He has no convincing symptoms of angina and he reports overall improvement in symptoms. He had a previous nuclear stress [facility] less than a year [facility] which was normal. Thus, I recommend holding off on ischemic cardiac evaluation at the present time. If he develops recurrent symptoms concerning for angina, it might actually be better to proceed directly with cardiac catheterization considering his weight and decreased accuracy of stress testing in his situation.

## 2015-04-24 ENCOUNTER — Other Ambulatory Visit: Payer: Self-pay | Admitting: *Deleted

## 2015-04-24 ENCOUNTER — Ambulatory Visit (INDEPENDENT_AMBULATORY_CARE_PROVIDER_SITE_OTHER): Payer: BC Managed Care – PPO

## 2015-04-24 DIAGNOSIS — I493 Ventricular premature depolarization: Secondary | ICD-10-CM | POA: Diagnosis not present

## 2015-04-24 MED ORDER — PREDNISONE 20 MG PO TABS: 50.0000 mg | ORAL_TABLET | Freq: Every day | ORAL | Status: DC

## 2015-04-26 DIAGNOSIS — Y841 Kidney dialysis as the cause of abnormal reaction of the patient, or of later complication, without mention of misadventure at the time of the procedure: Secondary | ICD-10-CM | POA: Diagnosis not present

## 2015-04-26 DIAGNOSIS — J449 Chronic obstructive pulmonary disease, unspecified: Secondary | ICD-10-CM | POA: Diagnosis not present

## 2015-04-26 DIAGNOSIS — N186 End stage renal disease: Secondary | ICD-10-CM | POA: Diagnosis not present

## 2015-04-26 DIAGNOSIS — E785 Hyperlipidemia, unspecified: Secondary | ICD-10-CM | POA: Diagnosis not present

## 2015-04-26 DIAGNOSIS — I10 Essential (primary) hypertension: Secondary | ICD-10-CM | POA: Diagnosis not present

## 2015-04-26 DIAGNOSIS — T859XXA Unspecified complication of internal prosthetic device, implant and graft, initial encounter: Secondary | ICD-10-CM | POA: Diagnosis not present

## 2015-04-26 DIAGNOSIS — E669 Obesity, unspecified: Secondary | ICD-10-CM | POA: Diagnosis not present

## 2015-04-26 DIAGNOSIS — J8489 Other specified interstitial pulmonary diseases: Secondary | ICD-10-CM | POA: Diagnosis not present

## 2015-05-01 ENCOUNTER — Ambulatory Visit: Payer: Medicare Other | Admitting: Endocrinology

## 2015-05-11 ENCOUNTER — Ambulatory Visit: Payer: BC Managed Care – PPO | Attending: Family | Admitting: Family

## 2015-05-11 ENCOUNTER — Encounter: Payer: Self-pay | Admitting: Family

## 2015-05-11 VITALS — BP 155/69 | HR 83 | Resp 20 | Ht 73.0 in | Wt 298.0 lb

## 2015-05-11 DIAGNOSIS — J8489 Other specified interstitial pulmonary diseases: Secondary | ICD-10-CM | POA: Diagnosis not present

## 2015-05-11 DIAGNOSIS — N186 End stage renal disease: Secondary | ICD-10-CM | POA: Insufficient documentation

## 2015-05-11 DIAGNOSIS — Z794 Long term (current) use of insulin: Secondary | ICD-10-CM | POA: Diagnosis not present

## 2015-05-11 DIAGNOSIS — N2581 Secondary hyperparathyroidism of renal origin: Secondary | ICD-10-CM | POA: Insufficient documentation

## 2015-05-11 DIAGNOSIS — D638 Anemia in other chronic diseases classified elsewhere: Secondary | ICD-10-CM | POA: Diagnosis not present

## 2015-05-11 DIAGNOSIS — Z992 Dependence on renal dialysis: Secondary | ICD-10-CM | POA: Diagnosis not present

## 2015-05-11 DIAGNOSIS — R0602 Shortness of breath: Secondary | ICD-10-CM | POA: Diagnosis not present

## 2015-05-11 DIAGNOSIS — G4733 Obstructive sleep apnea (adult) (pediatric): Secondary | ICD-10-CM | POA: Diagnosis not present

## 2015-05-11 DIAGNOSIS — E079 Disorder of thyroid, unspecified: Secondary | ICD-10-CM | POA: Insufficient documentation

## 2015-05-11 DIAGNOSIS — Z6839 Body mass index (BMI) 39.0-39.9, adult: Secondary | ICD-10-CM | POA: Diagnosis not present

## 2015-05-11 DIAGNOSIS — E109 Type 1 diabetes mellitus without complications: Secondary | ICD-10-CM | POA: Diagnosis not present

## 2015-05-11 DIAGNOSIS — I5032 Chronic diastolic (congestive) heart failure: Secondary | ICD-10-CM | POA: Insufficient documentation

## 2015-05-11 DIAGNOSIS — I120 Hypertensive chronic kidney disease with stage 5 chronic kidney disease or end stage renal disease: Secondary | ICD-10-CM | POA: Diagnosis not present

## 2015-05-11 DIAGNOSIS — I10 Essential (primary) hypertension: Secondary | ICD-10-CM

## 2015-05-11 NOTE — Progress Notes (Signed)
Subjective:    Patient ID: Ryan Arnold, male    DOB: 1961/02/14, 54 y.o.   MRN: [id number]  Congestive Heart Failure Presents for follow-up visit. The disease course has been stable. Associated symptoms include edema, fatigue (tired walking into the office) and shortness of breath (wearing oxygen at 3L as needed during the day and at bedtime). Pertinent negatives include no abdominal pain, chest pain, chest pressure, orthopnea or palpitations. The symptoms have been stable. Past treatments include beta blockers, oxygen and salt and fluid restriction. The treatment provided moderate relief. Compliance with prior treatments has been good. His past medical history is significant for chronic lung disease and HTN. Compliance with total regimen is 76-100%.  Other This is a chronic (edema) problem. The current episode started more than 1 year ago. The problem occurs daily. The problem has been unchanged. Associated symptoms include fatigue (tired walking into the office). Pertinent negatives include no abdominal pain, chest pain, congestion, coughing, headaches, neck pain, rash or sore throat. The symptoms are aggravated by standing and walking. He has tried lying down and position changes for the symptoms. The treatment provided mild relief.    Past Medical History  Diagnosis Date  . Essential hypertension   . Hyperlipemia   . End stage renal disease     a. on dialysis; still makes urine.  [name] BOOP (bronchiolitis obliterans with organizing pneumonia)     a. 01/2018 s/p R thoracoscopy/Bx confirming BOOP;  b. 02/2018 high dose steroids started.  . Chronic diastolic CHF (congestive heart failure)     a. 12/2014 Echo: EF 50-55%, mild conc LVH, mildly dil LA.  . Morbid obesity   . Secondary hyperparathyroidism   . Anemia of chronic disease   . Recurrent pneumonia     a. 2/2 BOOP.  [name] Chest pain     a. 06/2014 Myoview ([facility]) no ischemia/infarct, nl EF.  . Type 1 diabetes mellitus   . Thyroid disease    . OSA treated with BiPAP    Past Surgical History  Procedure Laterality Date  . Cataract Bilateral   . Shoulder surgery Left   . Knee surgery Right   . Gallbladder surgery    . Lung biopsy     Family History  Problem Relation Age of Onset  . Diabetes Mother     died @ 13.  [name] Hypertension Mother     History  Substance Use Topics  . Smoking status: Never Smoker   . Smokeless tobacco: Never Used  . Alcohol Use: No    Allergies  Allergen Reactions  . Oxycodone-Acetaminophen Nausea And Vomiting    Prior to Admission medications   Medication Sig Start Date End Date Taking? Authorizing Provider  allopurinol (ZYLOPRIM) 100 MG tablet Take 1 tablet (100 mg total) by mouth daily. 04/11/15  Yes Delma Freeze, FNP  amLODipine (NORVASC) 10 MG tablet Take 1 tablet (10 mg total) by mouth daily. 03/21/15  Yes Enid Baas, MD  aspirin 81 MG chewable tablet Chew 1 tablet (81 mg total) by mouth daily. 03/21/15  Yes Enid Baas, MD  atorvastatin (LIPITOR) 20 MG tablet Take 1 tablet (20 mg total) by mouth at bedtime. 04/11/15  Yes Delma Freeze, FNP  buPROPion (WELLBUTRIN) 75 MG tablet Take 75 mg by mouth 2 (two) times daily. 01/02/15  Yes Historical Provider, MD  clonazePAM (KLONOPIN) 0.5 MG tablet Take 1 tablet (0.5 mg total) by mouth at bedtime as needed. 03/21/15 06/04/15 Yes Enid Baas, MD  DULoxetine (CYMBALTA)  60 MG capsule Take 120 mg by mouth daily. 01/02/15  Yes Historical Provider, MD  furosemide (LASIX) 80 MG tablet Take 1 tablet (80 mg total) by mouth 4 (four) times a week. Tuesday, Thursday, Saturday, and Sunday. 04/11/15  Yes Delma Freeze, FNP  gabapentin (NEURONTIN) 100 MG capsule Take 100 mg by mouth daily. 12/26/14  Yes Historical Provider, MD  hydrALAZINE (APRESOLINE) 25 MG tablet Take 1 tablet (25 mg total) by mouth 3 (three) times daily. 04/13/15  Yes Iran Ouch, MD  insulin aspart (NOVOLOG) 100 UNIT/ML FlexPen Inject 22 Units into the skin 3 (three) times  daily with meals.  05/25/14  Yes Historical Provider, MD  LANTUS SOLOSTAR 100 UNIT/ML Solostar Pen Inject 11-18 Units into the skin See admin instructions. Inject 18 units subcutaneous every morning and Inject 11 units subcutaneous every night at bedtime. 12/18/14  Yes Historical Provider, MD  metoprolol (LOPRESSOR) 50 MG tablet Take 50 mg by mouth 2 (two) times daily. 12/23/14  Yes Historical Provider, MD  multivitamin (RENA-VIT) TABS tablet Take 1 tablet by mouth daily. 01/09/14  Yes Historical Provider, MD  pantoprazole (PROTONIX) 40 MG tablet Take 1 tablet (40 mg total) by mouth daily. 03/21/15  Yes Vipul Sherryll Burger, MD  predniSONE (DELTASONE) 20 MG tablet Take 2.5 tablets (50 mg total) by mouth daily with breakfast. 04/24/15  Yes [name] Mungal, MD  SENSIPAR 30 MG tablet Take 30 mg by mouth daily. 12/18/14  Yes Historical Provider, MD  sevelamer carbonate (RENVELA) 800 MG tablet Take 800 mg by mouth 3 (three) times daily. 03/29/14  Yes Historical Provider, MD     Review of Systems  Constitutional: Positive for fatigue (tired walking into the office). Negative for appetite change.  HENT: Positive for rhinorrhea. Negative for congestion and sore throat.   Eyes: Negative.   Respiratory: Positive for shortness of breath (wearing oxygen at 3L as needed during the day and at bedtime). Negative for cough, chest tightness and wheezing.   Cardiovascular: Positive for leg swelling. Negative for chest pain and palpitations.  Gastrointestinal: Positive for abdominal distention (sometimes). Negative for abdominal pain.       Having burning in epigastric area.   Endocrine: Negative.   Genitourinary: Negative.   Musculoskeletal: Negative for back pain, neck pain and neck stiffness.  Skin: Positive for wound (noninfected open areas on bilateral shins). Negative for rash.  Allergic/Immunologic: Negative.   Neurological: Positive for dizziness (when getting up too quickly). Negative for light-headedness and headaches.   Hematological: Negative for adenopathy. Does not bruise/bleed easily.  Psychiatric/Behavioral: Negative for sleep disturbance (sleeping on 2 pillows and with oxygen). The patient is not nervous/anxious.        Objective:   Physical Exam  Constitutional: He is oriented to person, place, and time. He appears well-developed and well-nourished.  HENT:  Head: Normocephalic and atraumatic.  Eyes: Conjunctivae are normal. Pupils are equal, round, and reactive to light.  Neck: Normal range of motion. Neck supple.  Cardiovascular: Normal rate and regular rhythm.   Pulmonary/Chest: Effort normal. He has no wheezes. He has no rales.  Abdominal: Soft. He exhibits no distension. There is no tenderness.  Musculoskeletal: He exhibits edema (2+ pitting edema in bilateral lower legs). He exhibits no tenderness.  Neurological: He is alert and oriented to person, place, and time.  Skin: Skin is warm. Lesion (dime sized area on both shins, currently healing well) noted.  Psychiatric: He has a normal mood and affect. His behavior is normal. Thought content normal.  Nursing note and vitals reviewed.   BP 155/69 mmHg  Pulse 83  Resp 20  Ht 6\' 1"  (1.854 m)  Wt 298 lb (135.172 kg)  BMI 39.32 kg/m2  SpO2 100%       Assessment & Plan:  1: Chronic heart failure with preserved ejection fraction- Patient presents with some fatigue and shortness of breath upon exertion. He says that he did experience symptoms walking into the office today. Once he sits down with his oxygen on , his symptoms improve. He brought his weight chart for review and it does show a fluctuating weight that appears to come down after dialysis. Encouraged him to continue weighing daily and call for an overnight weight gain of >2 pounds or a weekly weight gain of >5 pounds. He is not adding any salt to his food and is trying to closely follow a low sodium diet. He feels like his edema is still the same although he now has 2 open areas on  his shins that are about dime sized. Currently look like they are healing well without any signs of infection. Encouraged him to monitor these closely and call his PCP if they get larger or become infectious.  2: HTN- Blood pressure here looks good and is improved from last time. Home blood pressure readings remain elevated. Continue medications at this time. 3: BOOP- Wearing his oxygen at bedtime and during the day as needed. Prednisone is down to 50mg  daily and he's being followed closely by his pulmonologist. Currently has noticed pimples on his forehead and his chest.  Return in 3 months or sooner for any questions/problems before then.

## 2015-05-11 NOTE — Patient Instructions (Signed)
Continue weighing daily and call for an overnight weight gain of > 2 pounds or a weekly weight gain of >5 pounds. 

## 2015-05-14 ENCOUNTER — Ambulatory Visit: Payer: Medicare Other | Admitting: Endocrinology

## 2015-05-17 ENCOUNTER — Encounter: Payer: Self-pay | Admitting: Endocrinology

## 2015-05-17 ENCOUNTER — Other Ambulatory Visit: Payer: Self-pay | Admitting: *Deleted

## 2015-05-17 ENCOUNTER — Ambulatory Visit (INDEPENDENT_AMBULATORY_CARE_PROVIDER_SITE_OTHER): Payer: BC Managed Care – PPO | Admitting: Endocrinology

## 2015-05-17 VITALS — BP 130/74 | HR 89 | Temp 97.8°F | Resp 16 | Ht 73.0 in | Wt 307.4 lb

## 2015-05-17 DIAGNOSIS — E1165 Type 2 diabetes mellitus with hyperglycemia: Secondary | ICD-10-CM

## 2015-05-17 DIAGNOSIS — IMO0002 Reserved for concepts with insufficient information to code with codable children: Secondary | ICD-10-CM

## 2015-05-17 DIAGNOSIS — L97919 Non-pressure chronic ulcer of unspecified part of right lower leg with unspecified severity: Secondary | ICD-10-CM | POA: Diagnosis not present

## 2015-05-17 DIAGNOSIS — L97929 Non-pressure chronic ulcer of unspecified part of left lower leg with unspecified severity: Secondary | ICD-10-CM | POA: Diagnosis not present

## 2015-05-17 MED ORDER — LANTUS SOLOSTAR 100 UNIT/ML ~~LOC~~ SOPN: PEN_INJECTOR | SUBCUTANEOUS | Status: DC

## 2015-05-17 MED ORDER — BUPROPION HCL 75 MG PO TABS: 75.0000 mg | ORAL_TABLET | Freq: Two times a day (BID) | ORAL | Status: DC

## 2015-05-17 MED ORDER — PANTOPRAZOLE SODIUM 40 MG PO TBEC
40.0000 mg | DELAYED_RELEASE_TABLET | Freq: Every day | ORAL | Status: DC
Start: 2015-05-17 — End: 2015-06-21

## 2015-05-17 NOTE — Patient Instructions (Signed)
Take LANTUS IN AM EARLY and go up to 36 units and take 8 units in pm  Novolog base dose to 20 in am, 26 at lunch and supper     Blood Glucose (mg/dL)  Breakfast  (Units Novolog Insulin)  Lunch  (Units Novolog Insulin)  Supper  (Units Novolog Insulin)  Nighttime (Units Novolog Insulin)   <70 Treat the low blood sugar.  Recheck blood glucose  in 15 mins. If sugar is more than 70, then take the number of units of insulin in the 70-90 row, if before a meal.   70-90  0  91-130 (Base Dose)  0   131-150  0   151-200  0   201-250  0   251-300  26 32 32 1   301-500  27 34 34 2   351-400       3   401-450     4   >450     5

## 2015-05-17 NOTE — Progress Notes (Signed)
Patient ID: Ryan Arnold, male   DOB: 05-16-1961, 54 y.o.   MRN: [id number]    Chief complaint : Follow up of Type 1 Diabetes  History of Present Illness:          Date of diagnosis: Age 70      Prior history:   Recalls has been on insulin since diagnosis. Has tried other insulin therapies in the past. Was on insulin pump 2 years ago, and then taken off pump during hospitalization and never put back on it.  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 Novolog 22-30 units based on blood sugar level before each meal  Recent history:  He was started on large doses of prednisone in June for his pulmonary condition and blood sugars have been much higher since then He was seen by the endocrinologist in 6/16 and his Novolog will increase but his Lantus was not changed.  Current treatment regimen:  He takes his Lantus at noon instead of in the morning since on Mondays/Wednesdays/Fridays he is going for dialysis early morning  Also his breakfast meal is relatively small when going for dialysis and usually will not eat anything when not going for dialysis  However he takes the same amount of Novolog insulin for all meals regardless of meal size even though he is eating relatively larger meal at suppertime  He is still generally taking the same amount of Novolog on the days he is not going for dialysis early morning when he is only eating some crackers with his prednisone  Glucose patterns:  Fasting glucose: Variable but has had significantly high readings twice only; has had low sugar once also.  On the days he is not going for dialysis blood sugars are also variable in the mornings with much higher readings on last weekend  Postprandial glucoses in the afternoon and evenings are almost always over 400  Hyperglycemia is much more consistent in the afternoons and  evenings when blood sugars are averaging at least 400  Hypoglycemia: Recorded only once, had a 42 reading at 4:30 AM about 10 days ago   Glucose monitoring:  done times a day         Glucometer: One Touch.      Blood Glucose reading summary from meter download:   PREMEAL Breakfast 11 AM-3 PM  Dinner  9-11 PM  Overall  Glucose range:  42-453   148-569   536, 600   200-546    Median:  170      283    Symptoms of hypoglycemia: Weakness and shakiness Self-care:   Meal times:  breakfast 5 am on dialysis days otherwise his first meal is at 12 noon  Exercise:.  unable to do any         Most recent dietitian/nurse educator visit : Years ago           Diabetes labs:  Lab Results  Component Value Date   HGBA1C 7.0* 03/15/2015   Lab Results  Component Value Date   LDLCALC SEE COMMENTS 03/19/2015   CREATININE 5.74* 03/21/2015        Medication List       This list is accurate as of: 05/17/15  1:16 PM.  Always use your most recent med list.               allopurinol 100  MG tablet  Commonly known as:  ZYLOPRIM  Take 1 tablet (100 mg total) by mouth daily.     amLODipine 10 MG tablet  Commonly known as:  NORVASC  Take 1 tablet (10 mg total) by mouth daily.     aspirin 81 MG chewable tablet  Chew 1 tablet (81 mg total) by mouth daily.     atorvastatin 20 MG tablet  Commonly known as:  LIPITOR  Take 1 tablet (20 mg total) by mouth at bedtime.     buPROPion 75 MG tablet  Commonly known as:  WELLBUTRIN  Take 1 tablet (75 mg total) by mouth 2 (two) times daily.     clonazePAM 0.5 MG tablet  Commonly known as:  KLONOPIN  Take 1 tablet (0.5 mg total) by mouth at bedtime as needed.     DULoxetine 60 MG capsule  Commonly known as:  CYMBALTA  Take 120 mg by mouth daily.     furosemide 80 MG tablet  Commonly known as:  LASIX  Take 1 tablet (80 mg total) by mouth 4 (four) times a week. Tuesday, Thursday, Saturday, and Sunday.     gabapentin 100 MG capsule  Commonly known  as:  NEURONTIN  Take 100 mg by mouth daily.     hydrALAZINE 25 MG tablet  Commonly known as:  APRESOLINE  Take 1 tablet (25 mg total) by mouth 3 (three) times daily.     insulin aspart 100 UNIT/ML FlexPen  Commonly known as:  NOVOLOG  Inject 22 Units into the skin 3 (three) times daily with meals.     LANTUS SOLOSTAR 100 UNIT/ML Solostar Pen  Generic drug:  Insulin Glargine  Inject 36 units at breakfast and 8 units at bedtime     metoprolol 50 MG tablet  Commonly known as:  LOPRESSOR  Take 50 mg by mouth 2 (two) times daily.     multivitamin Tabs tablet  Take 1 tablet by mouth daily.     pantoprazole 40 MG tablet  Commonly known as:  PROTONIX  Take 1 tablet (40 mg total) by mouth daily.     predniSONE 20 MG tablet  Commonly known as:  DELTASONE  Take 2.5 tablets (50 mg total) by mouth daily with breakfast.     SENSIPAR 30 MG tablet  Generic drug:  cinacalcet  Take 30 mg by mouth daily.     sevelamer carbonate 800 MG tablet  Commonly known as:  RENVELA  Take 800 mg by mouth 3 (three) times daily.        Allergies:  Allergies  Allergen Reactions  . Oxycodone-Acetaminophen Nausea And Vomiting    Past Medical History  Diagnosis Date  . Essential hypertension   . Hyperlipemia   . End stage renal disease     a. on dialysis; still makes urine.  [name] BOOP (bronchiolitis obliterans with organizing pneumonia)     a. 01/2018 s/p R thoracoscopy/Bx confirming BOOP;  b. 02/2018 high dose steroids started.  . Chronic diastolic CHF (congestive heart failure)     a. 12/2014 Echo: EF 50-55%, mild conc LVH, mildly dil LA.  . Morbid obesity   . Secondary hyperparathyroidism   . Anemia of chronic disease   . Recurrent pneumonia     a. 2/2 BOOP.  [name] Chest pain     a. 06/2014 Myoview ([facility]) no ischemia/infarct, nl EF.  . Type 1 diabetes mellitus   . Thyroid disease   . OSA treated with BiPAP     Past  Surgical History  Procedure Laterality Date  . Cataract Bilateral   .  Shoulder surgery Left   . Knee surgery Right   . Gallbladder surgery    . Lung biopsy      Family History  Problem Relation Age of Onset  . Diabetes Mother     died @ 77.  [name] Hypertension Mother     Social History:  reports that he has never smoked. He has never used smokeless tobacco. He reports that he does not drink alcohol or use illicit drugs.    Review of Systems:   CKD: He is on dialysis  He is on steroids for bronchiolitis obliterans and will need to be continued on these for another 6 months or so. He takes prednisone 50 mg in the morning  He is asking about ulcers on his legs, currently not being seen by another physician  Lipids: Appear poorly controlled  Lab Results  Component Value Date   CHOL 284* 03/19/2015   HDL >40 03/19/2015   LDLCALC SEE COMMENTS 03/19/2015   TRIG 78 03/19/2015   CHOLHDL SEE COMMENTS 03/19/2015    Diabetes complications: Nephropathy   Physical Examination:  BP 130/74 mmHg  Pulse 89  Temp(Src) 97.8 F (36.6 C)  Resp 16  Ht 6\' 1"  (1.854 m)  Wt 307 lb 6.4 oz (139.436 kg)  BMI 40.57 kg/m2  SpO2 98%      2+ ankle edema present He has shallow noninfected superficial ulcers on the lower legs    ASSESSMENT:  Diabetes type 1 with markedly poor control on prednisone   Problems identified:  He is taking very small amounts of basal insulin compared to mealtime insulin during the day  Because of his insulin resistance from prednisone he needs markedly increased amounts of insulin to cover his daytime hyperglycemia especially in the afternoon and evening when blood sugars are almost always over 400  He is taking Lantus generally about 6 hours apart during the day  He is not adjusting his mealtime doses based on what he is eating and is generally eating smaller meals in the morning and noon and larger meals in the evenings  PLAN:   He will double the dose of his Lantus in the morning and take it consistently early morning  regardless of his breakfast time  As a precaution against hypoglycemia will reduce his evening Lantus to 8 units  He probably should benefit from using [place] or [place] but these are apparently not covered by his insurance  Increase the amount of Novolog to cover lunch and dinner especially with his hyperglycemia in the afternoons and evenings from prednisone.  Will simplify his correction doses and given him to modify her chart of what he had been previously using  Will need to continuously adjust his insulin based on his blood sugar patterns  He will benefit from consultation with nurse educator  Needs to check more blood sugars after lunch and supper  He needs to establish with a PCP; he will be apparently referred to wound care center by his dialysis center, meanwhile he can apply OTC antibiotic ointment on his leg ulcers  Counseling time on subjects discussed above is over 50% of today's 25 minute visit  Ryan Arnold 05/17/2015, 1:16 PM

## 2015-05-24 DIAGNOSIS — Z23 Encounter for immunization: Secondary | ICD-10-CM | POA: Diagnosis not present

## 2015-05-25 ENCOUNTER — Encounter: Payer: BC Managed Care – PPO | Attending: Surgery | Admitting: Surgery

## 2015-05-25 DIAGNOSIS — I120 Hypertensive chronic kidney disease with stage 5 chronic kidney disease or end stage renal disease: Secondary | ICD-10-CM | POA: Diagnosis not present

## 2015-05-25 DIAGNOSIS — E1040 Type 1 diabetes mellitus with diabetic neuropathy, unspecified: Secondary | ICD-10-CM | POA: Diagnosis not present

## 2015-05-25 DIAGNOSIS — G4730 Sleep apnea, unspecified: Secondary | ICD-10-CM | POA: Diagnosis not present

## 2015-05-25 DIAGNOSIS — Z794 Long term (current) use of insulin: Secondary | ICD-10-CM | POA: Diagnosis not present

## 2015-05-25 DIAGNOSIS — I890 Lymphedema, not elsewhere classified: Secondary | ICD-10-CM | POA: Insufficient documentation

## 2015-05-25 DIAGNOSIS — N186 End stage renal disease: Secondary | ICD-10-CM | POA: Insufficient documentation

## 2015-05-25 DIAGNOSIS — E10622 Type 1 diabetes mellitus with other skin ulcer: Secondary | ICD-10-CM | POA: Insufficient documentation

## 2015-05-25 DIAGNOSIS — L97221 Non-pressure chronic ulcer of left calf limited to breakdown of skin: Secondary | ICD-10-CM | POA: Insufficient documentation

## 2015-05-25 DIAGNOSIS — L97211 Non-pressure chronic ulcer of right calf limited to breakdown of skin: Secondary | ICD-10-CM | POA: Diagnosis not present

## 2015-05-25 DIAGNOSIS — Z992 Dependence on renal dialysis: Secondary | ICD-10-CM | POA: Diagnosis not present

## 2015-05-25 DIAGNOSIS — E1022 Type 1 diabetes mellitus with diabetic chronic kidney disease: Secondary | ICD-10-CM | POA: Diagnosis not present

## 2015-05-25 DIAGNOSIS — I5032 Chronic diastolic (congestive) heart failure: Secondary | ICD-10-CM | POA: Insufficient documentation

## 2015-05-26 NOTE — Progress Notes (Signed)
IVAR, DOMANGUE ([id number]) Visit Report for 05/25/2015 Abuse/Suicide Risk Screen Details Patient Name: Ryan Arnold, Ryan Arnold Date of Service: 05/25/2015 1:00 PM Medical Record Number: [id number] Patient Account Number: [id number] Date of Birth/Sex: Dec 26, 1960 (53 y.o. Male) Treating RN: Curtis Sites Primary Care Physician: Natale Milch Other Clinician: Referring Physician: Windell Moment Treating Physician/Extender: Rudene Re in Treatment: 0 Abuse/Suicide Risk Screen Items Answer ABUSE/SUICIDE RISK SCREEN: Has anyone close to you tried to hurt or harm you recentlyo No Do you feel uncomfortable with anyone in your familyo No Has anyone forced you do things that you didnot want to doo No Do you have any thoughts of harming yourselfo No Patient displays signs or symptoms of abuse and/or neglect. No Electronic Signature(s) Signed: 05/25/2015 3:37:30 PM By: Curtis Sites Entered By: Curtis Sites on 05/25/2015 13:32:05 Ryan Arnold ([id number]) -------------------------------------------------------------------------------- Activities of Daily Living Details Patient Name: Ryan Arnold Date of Service: 05/25/2015 1:00 PM Medical Record Number: [id number] Patient Account Number: [id number] Date of Birth/Sex: Dec 06, 1960 (53 y.o. Male) Treating RN: Curtis Sites Primary Care Physician: Natale Milch Other Clinician: Referring Physician: Windell Moment Treating Physician/Extender: Rudene Re in Treatment: 0 Activities of Daily Living Items Answer Activities of Daily Living (Please select one for each item) Drive Automobile Completely Able Take Medications Completely Able Use Telephone Completely Able Care for Appearance Completely Able Use Toilet Completely Able Bath / Shower Completely Able Dress Self Completely Able Feed Self Completely Able Walk Completely Able Get In / Out Bed Completely Able Housework Completely Able Prepare Meals Completely Able Handle  Money Completely Able Shop for Self Completely Able Electronic Signature(s) Signed: 05/25/2015 3:37:30 PM By: Curtis Sites Entered By: Curtis Sites on 05/25/2015 13:32:23 Ryan Arnold ([id number]) -------------------------------------------------------------------------------- Education Assessment Details Patient Name: Ryan Arnold Date of Service: 05/25/2015 1:00 PM Medical Record Number: [id number] Patient Account Number: [id number] Date of Birth/Sex: 1960/10/19 (54 y.o. Male) Treating RN: Curtis Sites Primary Care Physician: Natale Milch Other Clinician: Referring Physician: Windell Moment Treating Physician/Extender: Rudene Re in Treatment: 0 Primary Learner Assessed: Patient Learning Preferences/Education Level/Primary Language Learning Preference: Explanation, Demonstration Highest Education Level: College or Above Preferred Language: English Cognitive Barrier Assessment/Beliefs Language Barrier: No Translator Needed: No Memory Deficit: No Emotional Barrier: No Cultural/Religious Beliefs Affecting Medical No Care: Physical Barrier Assessment Impaired Vision: No Impaired Hearing: No Decreased Hand dexterity: No Knowledge/Comprehension Assessment Knowledge Level: Medium Comprehension Level: Medium Ability to understand written Medium instructions: Ability to understand verbal Medium instructions: Motivation Assessment Anxiety Level: Calm Cooperation: Cooperative Education Importance: Acknowledges Need Interest in Health Problems: Asks Questions Perception: Coherent Willingness to Engage in Self- Medium Management Activities: Readiness to Engage in Self- Medium Management Activities: Electronic Signature(s) Ryan Arnold, Ryan Arnold ([id number]) Signed: 05/25/2015 3:37:30 PM By: Curtis Sites Entered By: Curtis Sites on 05/25/2015 13:32:50 Ryan Arnold, Ryan Arnold  ([id number]) -------------------------------------------------------------------------------- Fall Risk Assessment Details Patient Name: Ryan Arnold Date of Service: 05/25/2015 1:00 PM Medical Record Number: [id number] Patient Account Number: [id number] Date of Birth/Sex: 22-Oct-1960 (54 y.o. Male) Treating RN: Curtis Sites Primary Care Physician: Natale Milch Other Clinician: Referring Physician: Windell Moment Treating Physician/Extender: Rudene Re in Treatment: 0 Fall Risk Assessment Items FALL RISK ASSESSMENT: History of falling - immediate or within 3 months 0 No Secondary diagnosis 0 No Ambulatory aid None/bed rest/wheelchair/nurse 0 Yes Crutches/cane/walker 0 No Furniture 0 No IV Access/Saline Lock 0 No Gait/Training Normal/bed rest/immobile 0 Yes Weak 0 No Impaired 0 No Mental Status Oriented to own ability 0 Yes Electronic Signature(s) Signed: 05/25/2015  3:37:30 PM By: Curtis Sites Entered By: Curtis Sites on 05/25/2015 13:33:04 Ryan Arnold ([id number]) -------------------------------------------------------------------------------- Foot Assessment Details Patient Name: Ryan Arnold Date of Service: 05/25/2015 1:00 PM Medical Record Number: [id number] Patient Account Number: [id number] Date of Birth/Sex: 1960-11-25 (53 y.o. Male) Treating RN: Curtis Sites Primary Care Physician: Natale Milch Other Clinician: Referring Physician: Windell Moment Treating Physician/Extender: Rudene Re in Treatment: 0 Foot Assessment Items Site Locations + = Sensation present, - = Sensation absent, C = Callus, U = Ulcer R = Redness, W = Warmth, M = Maceration, PU = Pre-ulcerative lesion F = Fissure, S = Swelling, D = Dryness Assessment Right: Left: Other Deformity: No No Prior Foot Ulcer: No No Prior Amputation: No No Charcot Joint: No No Ambulatory Status: Ambulatory Without Help Gait: Steady Electronic Signature(s) Signed: 05/25/2015 3:37:30  PM By: Curtis Sites Entered By: Curtis Sites on 05/25/2015 13:40:03 Ryan Arnold ([id number]) -------------------------------------------------------------------------------- Nutrition Risk Assessment Details Patient Name: Ryan Arnold Date of Service: 05/25/2015 1:00 PM Medical Record Number: [id number] Patient Account Number: [id number] Date of Birth/Sex: 11-02-1960 (54 y.o. Male) Treating RN: Curtis Sites Primary Care Physician: Natale Milch Other Clinician: Referring Physician: Windell Moment Treating Physician/Extender: Rudene Re in Treatment: 0 Height (in): 73 Weight (lbs): 298 Body Mass Index (BMI): 39.3 Nutrition Risk Assessment Items NUTRITION RISK SCREEN: I have an illness or condition that made me change the kind and/or 0 No amount of food I eat I eat fewer than two meals per day 0 No I eat few fruits and vegetables, or milk products 0 No I have three or more drinks of beer, liquor or wine almost every day 0 No I have tooth or mouth problems that make it hard for me to eat 0 No I don't always have enough money to buy the food I need 0 No I eat alone most of the time 0 No I take three or more different prescribed or over-the-counter drugs a 1 Yes day Without wanting to, I have lost or gained 10 pounds in the last six 0 No months I am not always physically able to shop, cook and/or feed myself 0 No Nutrition Protocols Good Risk Protocol 0 No interventions needed Moderate Risk Protocol Electronic Signature(s) Signed: 05/25/2015 3:37:30 PM By: Curtis Sites Entered By: Curtis Sites on 05/25/2015 13:33:12

## 2015-05-29 ENCOUNTER — Encounter: Payer: Self-pay | Admitting: Internal Medicine

## 2015-05-29 ENCOUNTER — Ambulatory Visit (INDEPENDENT_AMBULATORY_CARE_PROVIDER_SITE_OTHER): Payer: BC Managed Care – PPO | Admitting: Internal Medicine

## 2015-05-29 VITALS — BP 130/78 | HR 79 | Ht 73.0 in | Wt 303.0 lb

## 2015-05-29 DIAGNOSIS — J8489 Other specified interstitial pulmonary diseases: Secondary | ICD-10-CM | POA: Diagnosis not present

## 2015-05-29 DIAGNOSIS — G4733 Obstructive sleep apnea (adult) (pediatric): Secondary | ICD-10-CM

## 2015-05-29 NOTE — Assessment & Plan Note (Signed)
Following with [facility] AHI= 80.1 Currently on BiPAP 21/16  Patient advised to continue with BiPAP therapy and follow-up with [facility]

## 2015-05-29 NOTE — Progress Notes (Signed)
Ryan Arnold ([id number]) Visit Report for 05/25/2015 Allergy List Details Patient Name: Ryan Arnold, Ryan Arnold Date of Service: 05/25/2015 1:00 PM Medical Record Number: [id number] Patient Account Number: [id number] Date of Birth/Sex: 13-Oct-1961 (54 y.o. Male) Treating RN: Curtis Sites Primary Care Physician: Natale Milch Other Clinician: Referring Physician: Windell Moment Treating Physician/Extender: Rudene Re in Treatment: 0 Allergies Active Allergies Percocet Allergy Notes Electronic Signature(s) Signed: 05/25/2015 3:37:30 PM By: Curtis Sites Entered By: Curtis Sites on 05/25/2015 13:27:01 Ryan Arnold ([id number]) -------------------------------------------------------------------------------- Arrival Information Details Patient Name: Ryan Arnold Date of Service: 05/25/2015 1:00 PM Medical Record Number: [id number] Patient Account Number: [id number] Date of Birth/Sex: 1961-04-28 (54 y.o. Male) Treating RN: Curtis Sites Primary Care Physician: Natale Milch Other Clinician: Referring Physician: Windell Moment Treating Physician/Extender: Rudene Re in Treatment: 0 Visit Information Patient Arrived: Ambulatory Arrival Time: 13:21 Accompanied By: self Transfer Assistance: None Patient Identification Verified: Yes Secondary Verification Process Yes Completed: Patient Has Alerts: Yes Patient Alerts: DMII NO BP IN LEFT ARM ABI [place] bilateral Electronic Signature(s) Signed: 05/25/2015 3:37:30 PM By: Curtis Sites Entered By: Curtis Sites on 05/25/2015 13:55:45 Ryan Arnold ([id number]) -------------------------------------------------------------------------------- Clinic Level of Care Assessment Details Patient Name: Ryan Arnold Date of Service: 05/25/2015 1:00 PM Medical Record Number: [id number] Patient Account Number: [id number] Date of Birth/Sex: 10-Mar-1961 (54 y.o. Male) Treating RN: Huel Coventry Primary Care Physician: Natale Milch Other Clinician: Referring Physician: Windell Moment Treating Physician/Extender: Rudene Re in Treatment: 0 Clinic Level of Care Assessment Items TOOL 2 Quantity Score []  - Use when only an EandM is performed on the INITIAL visit 0 ASSESSMENTS - Nursing Assessment / Reassessment X - General Physical Exam (combine w/ comprehensive assessment (listed just 1 20 below) when performed on new pt. evals) X - Comprehensive Assessment (HX, ROS, Risk Assessments, Wounds Hx, etc.) 1 25 ASSESSMENTS - Wound and Skin Assessment / Reassessment []  - Simple Wound Assessment / Reassessment - one wound 0 X - Complex Wound Assessment / Reassessment - multiple wounds 2 5 []  - Dermatologic / Skin Assessment (not related to wound area) 0 ASSESSMENTS - Ostomy and/or Continence Assessment and Care []  - Incontinence Assessment and Management 0 []  - Ostomy Care Assessment and Management (repouching, etc.) 0 PROCESS - Coordination of Care []  - Simple Patient / Family Education for ongoing care 0 X - Complex (extensive) Patient / Family Education for ongoing care 1 20 []  - Staff obtains Chiropractor, Records, Test Results / Process Orders 0 []  - Staff telephones HHA, Nursing Homes / Clarify orders / etc 0 []  - Routine Transfer to another Facility (non-emergent condition) 0 []  - Routine Hospital Admission (non-emergent condition) 0 []  - New Admissions / Manufacturing engineer / Ordering NPWT, Apligraf, etc. 0 []  - Emergency Hospital Admission (emergent condition) 0 X - Simple Discharge Coordination 1 10 [name], [name]. ([id number]) []  - Complex (extensive) Discharge Coordination 0 PROCESS - Special Needs []  - Pediatric / Minor Patient Management 0 []  - Isolation Patient Management 0 []  - Hearing / Language / Visual special needs 0 []  - Assessment of Community assistance (transportation, D/C planning, etc.) 0 []  - Additional assistance / Altered mentation 0 []  - Support Surface(s) Assessment  (bed, cushion, seat, etc.) 0 INTERVENTIONS - Wound Cleansing / Measurement X - Wound Imaging (photographs - any number of wounds) 1 5 []  - Wound Tracing (instead of photographs) 0 []  - Simple Wound Measurement - one wound 0 X - Complex Wound Measurement - multiple wounds 2 5 []  -  Simple Wound Cleansing - one wound 0 X - Complex Wound Cleansing - multiple wounds 2 5 INTERVENTIONS - Wound Dressings X - Small Wound Dressing one or multiple wounds 1 10 []  - Medium Wound Dressing one or multiple wounds 0 []  - Large Wound Dressing one or multiple wounds 0 []  - Application of Medications - injection 0 INTERVENTIONS - Miscellaneous []  - External ear exam 0 []  - Specimen Collection (cultures, biopsies, blood, body fluids, etc.) 0 []  - Specimen(s) / Culture(s) sent or taken to Lab for analysis 0 []  - Patient Transfer (multiple staff / Michiel Sites Lift / Similar devices) 0 []  - Simple Staple / Suture removal (25 or less) 0 []  - Complex Staple / Suture removal (26 or more) 0 [name], [name] ([id number]) []  - Hypo / Hyperglycemic Management (close monitor of Blood Glucose) 0 X - Ankle / Brachial Index (ABI) - do not check if billed separately 1 15 Has the patient been seen at the hospital within the last three years: Yes Total Score: 135 Level Of Care: New/Established - Level 4 Electronic Signature(s) Signed: 05/28/2015 6:31:19 PM By: Elliot Gurney, RN, BSN, Kim RN, BSN Entered By: Elliot Gurney, RN, BSN, Kim on 05/25/2015 14:14:20 Ryan Arnold ([id number]) -------------------------------------------------------------------------------- Encounter Discharge Information Details Patient Name: Ryan Arnold Date of Service: 05/25/2015 1:00 PM Medical Record Number: [id number] Patient Account Number: [id number] Date of Birth/Sex: 02-13-1961 (54 y.o. Male) Treating RN: Curtis Sites Primary Care Physician: Natale Milch Other Clinician: Referring Physician: Windell Moment Treating Physician/Extender: Rudene Re in Treatment: 0 Encounter Discharge Information Items Discharge Pain Level: 0 Discharge Condition: Stable Ambulatory Status: Ambulatory Discharge Destination: Home Transportation: Private Auto Accompanied By: self Schedule Follow-up Appointment: Yes Medication Reconciliation completed and provided to Patient/Care No Roger Kettles: Provided on Clinical Summary of Care: 05/25/2015 Form Type Recipient Paper Patient MB Electronic Signature(s) Signed: 05/25/2015 2:25:20 PM By: Gwenlyn Perking Entered By: Gwenlyn Perking on 05/25/2015 14:25:20 Ryan Arnold ([id number]) -------------------------------------------------------------------------------- Lower Extremity Assessment Details Patient Name: Ryan Arnold Date of Service: 05/25/2015 1:00 PM Medical Record Number: [id number] Patient Account Number: [id number] Date of Birth/Sex: 1961-06-20 (54 y.o. Male) Treating RN: Curtis Sites Primary Care Physician: Natale Milch Other Clinician: Referring Physician: Windell Moment Treating Physician/Extender: Rudene Re in Treatment: 0 Edema Assessment Assessed: [Left: No] [Right: No] Edema: [Left: Yes] [Right: Yes] Calf Left: Right: Point of Measurement: 35 cm From Medial Instep 45.5 cm 45.3 cm Ankle Left: Right: Point of Measurement: 10 cm From Medial Instep 30 cm 30.7 cm Vascular Assessment Pulses: Posterior Tibial Palpable: [Left:Yes] [Right:Yes] Dorsalis Pedis Palpable: [Left:Yes] [Right:Yes] Extremity colors, hair growth, and conditions: Extremity Color: [Left:Hyperpigmented] [Right:Hyperpigmented] Hair Growth on Extremity: [Left:Yes] [Right:Yes] Temperature of Extremity: [Left:Warm] [Right:Warm] Capillary Refill: [Left:< 3 seconds] [Right:< 3 seconds] Blood Pressure: Brachial: [Right:178] Toe Nail Assessment Left: Right: Thick: No No Discolored: No No Deformed: No No Improper Length and Hygiene: No No Notes ABI [place] bilateral Electronic  Signature(s) JERIC, SLAGEL ([id number]) Signed: 05/25/2015 3:37:30 PM By: Curtis Sites Entered By: Curtis Sites on 05/25/2015 13:55:33 Ryan Arnold ([id number]) -------------------------------------------------------------------------------- Multi Wound Chart Details Patient Name: Ryan Arnold Date of Service: 05/25/2015 1:00 PM Medical Record Number: [id number] Patient Account Number: [id number] Date of Birth/Sex: 1961-02-06 (54 y.o. Male) Treating RN: Huel Coventry Primary Care Physician: Natale Milch Other Clinician: Referring Physician: Windell Moment Treating Physician/Extender: Rudene Re in Treatment: 0 Vital Signs Height(in): 73 Pulse(bpm): 81 Weight(lbs): 298 Blood Pressure 168/78 (mmHg): Body Mass Index(BMI): 39 Temperature(F): 98.3 Respiratory Rate 20 (  breaths/min): Photos: [1:No Photos] [2:No Photos] [N/A:N/A] Wound Location: [1:Left Lower Leg - Anterior] [2:Right Lower Leg - Anterior N/A] Wounding Event: [1:Gradually Appeared] [2:Gradually Appeared] [N/A:N/A] Primary Etiology: [1:Venous Leg Ulcer] [2:Venous Leg Ulcer] [N/A:N/A] Comorbid History: [1:Cataracts, Sleep Apnea, Congestive Heart Failure, Hypertension, Type I Diabetes, Neuropathy] [2:Cataracts, Sleep Apnea, N/A Congestive Heart Failure, Hypertension, Type I Diabetes, Neuropathy] Date Acquired: [1:04/23/2015] [2:04/23/2015] [N/A:N/A] Weeks of Treatment: [1:0] [2:0] [N/A:N/A] Wound Status: [1:Open] [2:Open] [N/A:N/A] Clustered Wound: [1:Yes] [2:No] [N/A:N/A] Measurements L x W x D 4x5.5x0.1 [2:0.2x0.2x0.1] [N/A:N/A] (cm) Area (cm) : [1:17.279] [2:0.031] [N/A:N/A] Volume (cm) : [1:1.728] [2:0.003] [N/A:N/A] % Reduction in Area: [1:0.00%] [2:0.00%] [N/A:N/A] % Reduction in Volume: 0.00% [2:0.00%] [N/A:N/A] Classification: [1:Partial Thickness] [2:Partial Thickness] [N/A:N/A] HBO Classification: [1:Grade 1] [2:Grade 1] [N/A:N/A] Exudate Amount: [1:Medium] [2:Medium] [N/A:N/A] Exudate  Type: [1:Serous] [2:Serous] [N/A:N/A] Exudate Color: [1:amber] [2:amber] [N/A:N/A] Wound Margin: [1:Flat and Intact] [2:Flat and Intact] [N/A:N/A] Granulation Amount: [1:Medium (34-66%)] [2:Medium (34-66%)] [N/A:N/A] Granulation Quality: [1:Red, Pink] [2:Red, Pink] [N/A:N/A] Necrotic Amount: [1:Medium (34-66%)] [2:Medium (34-66%)] [N/A:N/A] Necrotic Tissue: [1:Eschar, Adherent Slough] [2:Eschar, Adherent Slough] [N/A:N/A] Exposed Structures: [N/A:N/A] Fascia: No Fascia: No Fat: No Fat: No Tendon: No Tendon: No Muscle: No Muscle: No Joint: No Joint: No Bone: No Bone: No Limited to Skin Limited to Skin Breakdown Breakdown Epithelialization: Medium (34-66%) None N/A Periwound Skin Texture: Edema: No Edema: No N/A Excoriation: No Excoriation: No Induration: No Induration: No Callus: No Callus: No Crepitus: No Crepitus: No Fluctuance: No Fluctuance: No Friable: No Friable: No Rash: No Rash: No Scarring: No Scarring: No Periwound Skin Maceration: No Maceration: No N/A Moisture: Moist: No Moist: No Dry/Scaly: No Dry/Scaly: No Periwound Skin Color: Atrophie Blanche: No Atrophie Blanche: No N/A Cyanosis: No Cyanosis: No Ecchymosis: No Ecchymosis: No Erythema: No Erythema: No Hemosiderin Staining: No Hemosiderin Staining: No Mottled: No Mottled: No Pallor: No Pallor: No Rubor: No Rubor: No Temperature: No Abnormality No Abnormality N/A Tenderness on Yes No N/A Palpation: Wound Preparation: Ulcer Cleansing: Ulcer Cleansing: N/A Rinsed/Irrigated with Rinsed/Irrigated with Saline Saline Topical Anesthetic Topical Anesthetic Applied: Other: lidocaine Applied: Other: lidocaine 4% 4% Treatment Notes Electronic Signature(s) Signed: 05/28/2015 6:31:19 PM By: Elliot Gurney, RN, BSN, Kim RN, BSN Entered By: Elliot Gurney, RN, BSN, Kim on 05/25/2015 14:03:55 Ryan Arnold  ([id number]) -------------------------------------------------------------------------------- Multi-Disciplinary Care Plan Details Patient Name: Ryan Arnold Date of Service: 05/25/2015 1:00 PM Medical Record Number: [id number] Patient Account Number: [id number] Date of Birth/Sex: [birth year] (53 y.o. Male) Treating RN: Huel Coventry Primary Care Physician: Natale Milch Other Clinician: Referring Physician: Windell Moment Treating Physician/Extender: Rudene Re in Treatment: 0 Active Inactive Orientation to the Wound Care Program Nursing Diagnoses: Knowledge deficit related to the wound healing center program Goals: Patient/caregiver will verbalize understanding of the Wound Healing Center Program Date Initiated: 05/25/2015 Goal Status: Active Interventions: Provide education on orientation to the wound center Notes: Wound/Skin Impairment Nursing Diagnoses: Impaired tissue integrity Goals: Patient/caregiver will verbalize understanding of skin care regimen Date Initiated: 05/25/2015 Goal Status: Active Ulcer/skin breakdown will heal within 14 weeks Date Initiated: 05/25/2015 Goal Status: Active Interventions: Assess patient/caregiver ability to perform ulcer/skin care regimen upon admission and as needed Treatment Activities: Skin care regimen initiated : 05/25/2015 Notes: Electronic Signature(s) KAIYON, HYNES ([id number]) Signed: 05/28/2015 6:31:19 PM By: Elliot Gurney, RN, BSN, Kim RN, BSN Entered By: Elliot Gurney, RN, BSN, Kim on 05/25/2015 14:03:41 Ryan Arnold ([id number]) -------------------------------------------------------------------------------- Patient/Caregiver Education Details Patient Name: Ryan Arnold Date of Service: 05/25/2015 1:00 PM Medical Record Number: [id number] Patient Account Number: [id number] Date  of Birth/Gender: 09-May-1961 (54 y.o. Male) Treating RN: Curtis Sites Primary Care Physician: Natale Milch Other Clinician: Referring Physician:  Windell Moment Treating Physician/Extender: Rudene Re in Treatment: 0 Education Assessment Education Provided To: Patient Education Topics Provided Venous: Handouts: Other: compression hose daily Methods: Explain/Verbal Responses: State content correctly Wound/Skin Impairment: Handouts: Other: wound care as ordered Methods: Demonstration, Explain/Verbal Responses: State content correctly Electronic Signature(s) Signed: 05/25/2015 3:37:30 PM By: Curtis Sites Entered By: Curtis Sites on 05/25/2015 14:24:52 Tamura, Leitha Schuller ([id number]) -------------------------------------------------------------------------------- Wound Assessment Details Patient Name: Ryan Arnold Date of Service: 05/25/2015 1:00 PM Medical Record Number: [id number] Patient Account Number: [id number] Date of Birth/Sex: Jun 19, 1961 (54 y.o. Male) Treating RN: Curtis Sites Primary Care Physician: Natale Milch Other Clinician: Referring Physician: Windell Moment Treating Physician/Extender: Rudene Re in Treatment: 0 Wound Status Wound Number: 1 Primary Venous Leg Ulcer Etiology: Wound Location: Left Lower Leg - Anterior Wound Open Wounding Event: Gradually Appeared Status: Date Acquired: 04/23/2015 Comorbid Cataracts, Sleep Apnea, Congestive Weeks Of Treatment: 0 History: Heart Failure, Hypertension, Type I Clustered Wound: Yes Diabetes, Neuropathy Photos Photo Uploaded By: Curtis Sites on 05/25/2015 15:35:40 Wound Measurements Length: (cm) 4 Width: (cm) 5.5 Depth: (cm) 0.1 Area: (cm) 17.279 Volume: (cm) 1.728 % Reduction in Area: 0% % Reduction in Volume: 0% Epithelialization: Medium (34-66%) Tunneling: No Undermining: No Wound Description Classification: Partial Thickness Foul Odor Afte Diabetic Severity (Wagner): Grade 1 Wound Margin: Flat and Intact Exudate Amount: Medium Exudate Type: Serous Exudate Color: amber r Cleansing: No Wound Bed Granulation  Amount: Medium (34-66%) Exposed Structure Granulation Quality: Red, Pink Fascia Exposed: No Necrotic Amount: Medium (34-66%) Fat Layer Exposed: No NICHOALS, HEYDE ([id number]) Necrotic Quality: Eschar, Adherent Slough Tendon Exposed: No Muscle Exposed: No Joint Exposed: No Bone Exposed: No Limited to Skin Breakdown Periwound Skin Texture Texture Color No Abnormalities Noted: No No Abnormalities Noted: No Callus: No Atrophie Blanche: No Crepitus: No Cyanosis: No Excoriation: No Ecchymosis: No Fluctuance: No Erythema: No Friable: No Hemosiderin Staining: No Induration: No Mottled: No Localized Edema: No Pallor: No Rash: No Rubor: No Scarring: No Temperature / Pain Moisture Temperature: No Abnormality No Abnormalities Noted: No Tenderness on Palpation: Yes Dry / Scaly: No Maceration: No Moist: No Wound Preparation Ulcer Cleansing: Rinsed/Irrigated with Saline Topical Anesthetic Applied: Other: lidocaine 4%, Treatment Notes Wound #1 (Left, Anterior Lower Leg) 1. Cleansed with: Clean wound with Normal Saline 2. Anesthetic Topical Lidocaine 4% cream to wound bed prior to debridement 3. Peri-wound Care: Skin Prep 4. Dressing Applied: Prisma Ag 5. Secondary Dressing Applied Bordered Foam Dressing Electronic Signature(s) Signed: 05/25/2015 3:37:30 PM By: Curtis Sites Entered By: Curtis Sites on 05/25/2015 13:44:34 Skoog, Leitha Schuller ([id number]) -------------------------------------------------------------------------------- Wound Assessment Details Patient Name: Ryan Arnold Date of Service: 05/25/2015 1:00 PM Medical Record Number: [id number] Patient Account Number: [id number] Date of Birth/Sex: [birth year] (53 y.o. Male) Treating RN: Curtis Sites Primary Care Physician: Natale Milch Other Clinician: Referring Physician: Windell Moment Treating Physician/Extender: Rudene Re in Treatment: 0 Wound Status Wound Number: 2 Primary Venous Leg  Ulcer Etiology: Wound Location: Right Lower Leg - Anterior Wound Open Wounding Event: Gradually Appeared Status: Date Acquired: 04/23/2015 Comorbid Cataracts, Sleep Apnea, Congestive Weeks Of Treatment: 0 History: Heart Failure, Hypertension, Type I Clustered Wound: No Diabetes, Neuropathy Photos Photo Uploaded By: Curtis Sites on 05/25/2015 15:35:41 Wound Measurements Length: (cm) 0.2 Width: (cm) 0.2 Depth: (cm) 0.1 Area: (cm) 0.031 Volume: (cm) 0.003 % Reduction in Area: 0% % Reduction in Volume: 0% Epithelialization: None Tunneling: No  Undermining: No Wound Description Classification: Partial Thickness Foul Odor Afte Diabetic Severity (Wagner): Grade 1 Wound Margin: Flat and Intact Exudate Amount: Medium Exudate Type: Serous Exudate Color: amber r Cleansing: No Wound Bed Granulation Amount: Medium (34-66%) Exposed Structure Granulation Quality: Red, Pink Fascia Exposed: No Necrotic Amount: Medium (34-66%) Fat Layer Exposed: No THEDORE, PICKEL ([id number]) Necrotic Quality: Eschar, Adherent Slough Tendon Exposed: No Muscle Exposed: No Joint Exposed: No Bone Exposed: No Limited to Skin Breakdown Periwound Skin Texture Texture Color No Abnormalities Noted: No No Abnormalities Noted: No Callus: No Atrophie Blanche: No Crepitus: No Cyanosis: No Excoriation: No Ecchymosis: No Fluctuance: No Erythema: No Friable: No Hemosiderin Staining: No Induration: No Mottled: No Localized Edema: No Pallor: No Rash: No Rubor: No Scarring: No Temperature / Pain Moisture Temperature: No Abnormality No Abnormalities Noted: No Dry / Scaly: No Maceration: No Moist: No Wound Preparation Ulcer Cleansing: Rinsed/Irrigated with Saline Topical Anesthetic Applied: Other: lidocaine 4%, Treatment Notes Wound #2 (Right, Anterior Lower Leg) 1. Cleansed with: Clean wound with Normal Saline 2. Anesthetic Topical Lidocaine 4% cream to wound bed prior to  debridement 3. Peri-wound Care: Skin Prep 4. Dressing Applied: Prisma Ag 5. Secondary Dressing Applied Bordered Foam Dressing Electronic Signature(s) Signed: 05/25/2015 3:37:30 PM By: Curtis Sites Entered By: Curtis Sites on 05/25/2015 13:45:08 Ryan Arnold ([id number]) -------------------------------------------------------------------------------- Vitals Details Patient Name: Ryan Arnold Date of Service: 05/25/2015 1:00 PM Medical Record Number: [id number] Patient Account Number: [id number] Date of Birth/Sex: 1961-07-29 (54 y.o. Male) Treating RN: Curtis Sites Primary Care Physician: Natale Milch Other Clinician: Referring Physician: Windell Moment Treating Physician/Extender: Rudene Re in Treatment: 0 Vital Signs Time Taken: 13:22 Temperature (F): 98.3 Height (in): 73 Pulse (bpm): 81 Source: Stated Respiratory Rate (breaths/min): 20 Weight (lbs): 298 Blood Pressure (mmHg): 168/78 Source: Stated Reference Range: 80 - 120 mg / dl Body Mass Index (BMI): 39.3 Electronic Signature(s) Signed: 05/25/2015 3:37:30 PM By: Curtis Sites Entered By: Curtis Sites on 05/25/2015 13:26:48

## 2015-05-29 NOTE — Patient Instructions (Signed)
Follow up with Dr. Dema Severin in 4-6 weeks - Prednisone  (1.5 pills) daily until follow up.  If your symptoms (cough, chest tightness, congestion, increase short of breath) return for 3-4 days, then increase your steroid dose to  daily (2 pills) until follow up.  - cont with supplemental O2 3L continuously - please inform your PMD or Diabetic specialist of the change in steroid dose.  - given that you have been on steroid for 3 months please ask your Endocrinologist about a bone density study.

## 2015-05-29 NOTE — Progress Notes (Signed)
MRN# [id number] Ryan Arnold 10-07-1961   CC: Chief Complaint  Patient presents with  . Follow-up - BOOP    OSA; breathing is the same; SOB still;      Brief History: synopsis: 54 year old male past medical history of end-stage renal disease on dialysis, uncontrolled diabetes, hypertension, former retired Emergency planning/management officer, seen in consultation for recurrent pneumonias. Multiple hospitalizations at [facility] and at [facility] over the past one year for bilateral groundglass opacities throughout entire lung fields requiring multiple rounds of antibiotics and steroids. Had a bronchoscopy done at [facility] and within the last 2 years no acute findings from the BAL. Patient with recent bronchoscopy, March 2016, at [facility] by Dr. Dema Severin, BAL with no acute findings. April 2016 surgical lung bx (wedge) by Dr. Thelma Barge, path confirms BOOP, started on high dose steroids.   HPI March 2016 Patient is a pleasant 54 year old male presents today for hospital followup. Patient has a past medical history of end stage renal disease on dialysis, diabetes, hypertension, renal osteodystrophy, diastolic heart failure. Patient was seen in the hospital for recurrent pneumonias from March 1 to March 7, and during that time he had a bronchoscopy with BAL. Given his recurrent bouts of pneumonia over the past one year and being seen at [facility] and by [facility] over the past one year with noted ilateral groundglass opacities throughout the entire lung fields on chest CT and he was further evaluated by Dr. Thelma Barge ([facility]) for surgical lung biopsy, however given the patient was improving patient declined further surgical lung biopsy at that time. See below for full details from the hospital consult note on 12/13/2014. During this hospitalization patient also had a vasculitis panel which was negative. Patient does was readmitted since I last saw him at the beginning of March, again to [facility] last week for hypoxia shortness of breath and atypical  chest pain. He was again diagnosed with recurrent bilateral pneumonia (HCAP), acute on chronic diastolic heart failure. During this recent hospitalization he was treated for fluid overload, pneumonia, after receiving BiPAP and dialysis his breathing improved, he was prophylactically placed on antibiotics given the results of his CT scan which again showed bilateral upper lobe pneumonia.  Off note, he has had a bronchoscopy at [facility] in past 1.5 years, per the patient he said that there was no acute findings from that. Today patient states that he still has a mild dry cough, his shortness of breath has improved but he is concerned that since being off antibiotics his "pneumonia" may return. He was discharged this time from [facility] with 2 L of oxygen as needed, currently only wearing oxygen when needed - if out walking, or performing chores, which he subjectively judges his O2 needs.  His PMD ([facility] - Dr. Arty Baumgartner) ordered a sleep study for him, which he performed on 01/13/15, per patient he will require CPAP therapy.  Plan - referral to Dr. Thelma Barge for surgical lung bx.   ROV 03/01/15: Patient resents today for follow-up visit of his recent open lung biopsy. He had a recent ED visit for chest pain and sob Prior to starting his 6 minute walk test today he was complaining of intense shortness of breath along with chest discomfort, he was sent for a chest x-ray would like to discuss the results of it. Today he is also accompanied by his wife. He also endorses cough.  Plan: - Prednisone 80mg  daily with breakfast until follow up visit - Anticipate steroid treatment for 6-9 months. If there  is another relapse of BOOP, will have to consider another bronchoscopy to rule out atypical infections exacerbating BOOP. - continue with 3L O2 with exertion - Referral to see an Endocrinologist (Dr. Welford Roche) to help with Diabetes control while on high dose steroids.   ROV 03/2015 Patient presents today for a follow  up of his BOOP, currently on 60mg  Prednisone. Stated that he has fine "pimples" on his face and head since being on steroid.  Recent hospitalization for dCHF exacerbation, during that visit is steroids for BOOP was reduced from 80mg  daily to 60 mg daily.  Currently doing well, wearing 3L O2 with exertion. Walking from check-in area to patient room desaturated to the 70s (this was done without supplemental oxygen), and quickly rebounded to 92% saturation with rest and 3L. Following with [facility] (Dr. Chancy Milroy), currently on bipap at recent titration. During split study was noted to have irregular "heart beat" and he was referred to cardiology for possible holter monitoring.   Plan; BOOP - surgical biopsy proven Unknown trigger.  Plan: - continue with Prednisone 60mg  (3pills) until July 4th, starting July 5th take Prednisone 50 mg (2.5pills) daily until follow up  - Anticipate steroid treatment for 6-9 months (total). If there is another relapse of BOOP, will have to consider another bronchoscopy to rule out atypical infections exacerbating BOOP. - continue with 3L O2 with exertion  Events since last clinic visit: Patient returns for a follow-up of his BOOP, currently on 50 mg of prednisone. States that his breathing is no worse than his last visit, but is no better. He still wearing supplemental oxygen continuously, 3 L Patient states overall he does have good improvement in his breathing but is having side effects of steroids which include skin hyperpigmentation, lower extremity blistering, difficult to control diabetes or hypertension He currently follows with [facility] for a diagnosis of severe sleep apnea, AHI = 80       Medication:   Current Outpatient Rx  Name  Route  Sig  Dispense  Refill  . allopurinol (ZYLOPRIM) 100 MG tablet   Oral   Take 1 tablet (100 mg total) by mouth daily.   90 tablet   1   . amLODipine (NORVASC) 10 MG tablet   Oral   Take 1  tablet (10 mg total) by mouth daily.   30 tablet   1   . aspirin 81 MG chewable tablet   Oral   Chew 1 tablet (81 mg total) by mouth daily.   30 tablet   2   . atorvastatin (LIPITOR) 20 MG tablet   Oral   Take 1 tablet (20 mg total) by mouth at bedtime.   90 tablet   3   . buPROPion (WELLBUTRIN) 75 MG tablet   Oral   Take 1 tablet (75 mg total) by mouth 2 (two) times daily.   60 tablet   0   . clonazePAM (KLONOPIN) 0.5 MG tablet   Oral   Take 1 tablet (0.5 mg total) by mouth at bedtime as needed.   20 tablet   0   . DULoxetine (CYMBALTA) 60 MG capsule   Oral   Take 120 mg by mouth daily.      0   . furosemide (LASIX) 80 MG tablet   Oral   Take 1 tablet (80 mg total) by mouth 4 (four) times a week. Tuesday, Thursday, Saturday, and Sunday.   36 tablet   3   . gabapentin (NEURONTIN) 100  MG capsule   Oral   Take 100 mg by mouth daily.      1   . hydrALAZINE (APRESOLINE) 25 MG tablet   Oral   Take 1 tablet (25 mg total) by mouth 3 (three) times daily.   90 tablet   1   . insulin aspart (NOVOLOG) 100 UNIT/ML FlexPen   Subcutaneous   Inject 22 Units into the skin 3 (three) times daily with meals.          [name] LANTUS SOLOSTAR 100 UNIT/ML Solostar Pen      Inject 36 units at breakfast and 8 units at bedtime   15 mL   1     Dispense as written.    Dose Change   . lisinopril (PRINIVIL,ZESTRIL) 10 MG tablet   Oral   Take 10 mg by mouth daily.         . metoprolol (LOPRESSOR) 50 MG tablet   Oral   Take 50 mg by mouth 2 (two) times daily.         . multivitamin (RENA-VIT) TABS tablet   Oral   Take 1 tablet by mouth daily.         . pantoprazole (PROTONIX) 40 MG tablet   Oral   Take 1 tablet (40 mg total) by mouth daily.   30 tablet   0   . predniSONE (DELTASONE) 20 MG tablet   Oral   Take 2.5 tablets (50 mg total) by mouth daily with breakfast.   90 tablet   0   . SENSIPAR 30 MG tablet   Oral   Take 30 mg by mouth daily.            Dispense as written.   . sevelamer carbonate (RENVELA) 800 MG tablet   Oral   Take 800 mg by mouth 3 (three) times daily.            Review of Systems: Gen:  Denies  fever, sweats, chills HEENT: Denies blurred vision, double vision, ear pain, eye pain, hearing loss, nose bleeds, sore throat Cvc:  No dizziness, chest pain or heaviness Resp:   Admits to: Chronic shortness of breath, not worse Gi: Denies swallowing difficulty, stomach pain, nausea or vomiting, diarrhea, constipation, bowel incontinence Gu:  Denies bladder incontinence, burning urine Ext:   No Joint pain, stiffness or swelling Skin: Peripheral extremity bilateral, upper and lower, hyperpigmentation, mild chronic rash since starting steroids on forehead, lower extremity medial leg blisters Endoc:  No polyuria, polydipsia , polyphagia or weight change Other:  All other systems negative  Allergies:  Oxycodone-acetaminophen  Physical Examination:  VS: BP 130/78 mmHg  Pulse 79  Ht  (1.854 m)  Wt 303 lb (137.44 kg)  BMI 39.98 kg/m2  SpO2 100%  General Appearance: No distress  HEENT: PERRLA, no ptosis, no other lesions noticed Pulmonary:normal breath sounds., diaphragmatic excursion normal.No wheezing, No rales   Cardiovascular:  Normal S1,S2.  No m/r/g.     Abdomen:Exam: Benign, Soft, non-tender, No masses  Skin:   warm, fine chronic rash on forehead, bilateral lower extremity medial leg blisters with dressing around Extremities: normal, no cyanosis, clubbing, warm with normal capillary refill.      Assessment and Plan: 54 year old male with diagnosis of BOOP biopsy-proven seen for follow-up visit BOOP (bronchiolitis obliterans with organizing pneumonia) BOOP - surgical biopsy proven Unknown trigger. Today we will start weaning his prednisone from 50 mg to 30 mg. He is advised to increase to 40 mg of  prednisone but if his symptoms return  Plan: - continue with Prednisone 30mg  (1.5pills) until follow-up  visit. Patient is advised if symptoms return, and is persistent for 3-4 days, then increase prednisone to 40 mg daily until follow-up visit - Anticipate steroid treatment for 6-9 months (total). If there is another relapse of BOOP, will have to consider another bronchoscopy to rule out atypical infections exacerbating BOOP. - continue with 3L O2 with exertion and with sleep - Patient advised to Informed diabetic specialist/endocrinologist/primary care physician about reduction in steroids dose, evaluation for bone density study       OSA (obstructive sleep apnea) Following with [facility] AHI= 80.1 Currently on BiPAP 21/16  Patient advised to continue with BiPAP therapy and follow-up with [facility]     Updated Medication List Outpatient Encounter Prescriptions as of 05/29/2015  Medication Sig  . allopurinol (ZYLOPRIM) 100 MG tablet Take 1 tablet (100 mg total) by mouth daily.  [name] amLODipine (NORVASC) 10 MG tablet Take 1 tablet (10 mg total) by mouth daily.  [name] aspirin 81 MG chewable tablet Chew 1 tablet (81 mg total) by mouth daily.  [name] atorvastatin (LIPITOR) 20 MG tablet Take 1 tablet (20 mg total) by mouth at bedtime.  [name] buPROPion (WELLBUTRIN) 75 MG tablet Take 1 tablet (75 mg total) by mouth 2 (two) times daily.  . clonazePAM (KLONOPIN) 0.5 MG tablet Take 1 tablet (0.5 mg total) by mouth at bedtime as needed.  . DULoxetine (CYMBALTA) 60 MG capsule Take 120 mg by mouth daily.  . furosemide (LASIX) 80 MG tablet Take 1 tablet (80 mg total) by mouth 4 (four) times a week. Tuesday, Thursday, Saturday, and Sunday.  . gabapentin (NEURONTIN) 100 MG capsule Take 100 mg by mouth daily.  . hydrALAZINE (APRESOLINE) 25 MG tablet Take 1 tablet (25 mg total) by mouth 3 (three) times daily.  . insulin aspart (NOVOLOG) 100 UNIT/ML FlexPen Inject 22 Units into the skin 3 (three) times daily with meals.   [name] LANTUS SOLOSTAR 100 UNIT/ML Solostar Pen Inject 36 units at breakfast and 8 units  at bedtime  . lisinopril (PRINIVIL,ZESTRIL) 10 MG tablet Take 10 mg by mouth daily.  . metoprolol (LOPRESSOR) 50 MG tablet Take 50 mg by mouth 2 (two) times daily.  . multivitamin (RENA-VIT) TABS tablet Take 1 tablet by mouth daily.  . pantoprazole (PROTONIX) 40 MG tablet Take 1 tablet (40 mg total) by mouth daily.  . predniSONE (DELTASONE) 20 MG tablet Take 2.5 tablets (50 mg total) by mouth daily with breakfast.  . SENSIPAR 30 MG tablet Take 30 mg by mouth daily.  . sevelamer carbonate (RENVELA) 800 MG tablet Take 800 mg by mouth 3 (three) times daily.   No facility-administered encounter medications on file as of 05/29/2015.    Orders for this visit: No orders of the defined types were placed in this encounter.    Thank  you for the visitation and for allowing  [facility] to assist in the care of your patient. Our recommendations are noted above.  Please contact us if we can be of further service.  Stephanie Acre, MD [facility] Office Number: [phone]

## 2015-05-29 NOTE — Assessment & Plan Note (Addendum)
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 Today we will start weaning his prednisone from 50 mg to 30 mg. He is advised to increase to 40 mg of prednisone but if his symptoms return  Plan: - continue with Prednisone  (1.5pills) until follow-up visit. Patient is advised if symptoms return, and is persistent for 3-4 days, then increase prednisone to 40 mg daily until follow-up visit - Anticipate steroid treatment for 6-9 months (total). If there is another relapse of BOOP, will have to consider another bronchoscopy to rule out atypical infections exacerbating BOOP. - continue with 3L O2 with exertion and with sleep - Patient advised to Informed diabetic specialist/endocrinologist/primary care physician about reduction in steroids dose, evaluation for bone density study

## 2015-05-29 NOTE — Progress Notes (Signed)
HEMI, CHACKO ([id number]) Visit Report for 05/25/2015 Chief Complaint Document Details Patient Name: Ryan Arnold, Ryan Arnold Date of Service: 05/25/2015 1:00 PM Medical Record Number: [id number] Patient Account Number: [id number] Date of Birth/Sex: 13-Jul-1961 (53 y.o. Male) Treating RN: Curtis Sites Primary Care Physician: Natale Milch Other Clinician: Referring Physician: Windell Moment Treating Physician/Extender: Rudene Re in Treatment: 0 Information Obtained from: Patient Chief Complaint Patient presents to the wound care center for a consult due non healing wound. He's had swelling and open wounds on his lower extremities for the last several weeks. Electronic Signature(s) Signed: 05/25/2015 2:17:42 PM By: Evlyn Kanner MD, FACS Previous Signature: 05/25/2015 1:56:35 PM Version By: Evlyn Kanner MD, FACS Entered By: Evlyn Kanner on 05/25/2015 14:17:42 SAHID, BORBA ([id number]) -------------------------------------------------------------------------------- HPI Details Patient Name: Ryan Arnold Date of Service: 05/25/2015 1:00 PM Medical Record Number: [id number] Patient Account Number: [id number] Date of Birth/Sex: 1960/10/27 (54 y.o. Male) Treating RN: Curtis Sites Primary Care Physician: Natale Milch Other Clinician: Referring Physician: Windell Moment Treating Physician/Extender: Rudene Re in Treatment: 0 History of Present Illness Location: ulceration both lower extremities Quality: Patient reports No Pain. Severity: Patient states wound (s) are getting better. Duration: Patient has had the wound for > 3 years prior to seeking treatment at the wound center Context: The wound appeared gradually over time Modifying Factors: Other treatment(s) tried include:his had compression hose prescribed for him but does not wear it due to the weather Associated Signs and Symptoms: Patient reports having difficulty standing for long periods. HPI Description:  54 year old gentleman who has had problems with bilateral lower extremity edema and ulceration has recently been in hospital with multiple medical problems. 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 He also is known to have diastolic heart failure, BOOP and end-stage renal disease on hemodialysis. His hand infection and swelling of his lower extremities for several months. His most recent hemoglobin A1c was 7 and was taking appropriate treatments for diabetes and for his congestive heart failure. Electronic Signature(s) Signed: 05/25/2015 2:18:47 PM By: Evlyn Kanner MD, FACS Previous Signature: 05/25/2015 1:59:04 PM Version By: Evlyn Kanner MD, FACS Entered By: Evlyn Kanner on 05/25/2015 14:18:47 Ryan Arnold, Ryan Arnold ([id number]) -------------------------------------------------------------------------------- Physical Exam Details Patient Name: Ryan Arnold Date of Service: 05/25/2015 1:00 PM Medical Record Number: [id number] Patient Account Number: [id number] Date of Birth/Sex: [birth year] (54 y.o. Male) Treating RN: Curtis Sites Primary Care Physician: Natale Milch Other Clinician: Referring Physician: Windell Moment Treating Physician/Extender: Rudene Re in Treatment: 0 Constitutional . Pulse regular. Respirations normal and unlabored. Afebrile. . Eyes Nonicteric. Reactive to light. Ears, Nose, Mouth, and Throat Lips, teeth, and gums WNL.[name] Moist mucosa without lesions . Neck supple and nontender. No palpable supraclavicular or cervical adenopathy. Normal sized without goiter. Respiratory WNL. No retractions.. Cardiovascular Pedal Pulses WNL.his ABIs are noncompressible. he has +3 pitting edema of the lower extremities and feet. Chest Breasts symmetical and no nipple discharge.. Breast tissue WNL, no masses, lumps, or tenderness.. Gastrointestinal (GI) Abdomen without masses or tenderness.. No liver  or spleen enlargement or tenderness.. Lymphatic No adneopathy. No adenopathy. No adenopathy. Musculoskeletal Adexa without tenderness or enlargement.. Digits and nails w/o clubbing, cyanosis, infection, petechiae, ischemia, or inflammatory conditions.. Integumentary (Hair, Skin) No suspicious lesions. No crepitus or fluctuance. No peri-wound warmth or erythema. No masses.[name] Psychiatric Judgement and insight Intact.. No evidence of depression, anxiety, or agitation.. Notes He has small superficial skin breakdown and ulcerations  not going deep to the subcutaneous tissue. Around the anterior part of his lower extremity and are fairly superficial. Electronic Signature(s) Signed: 05/25/2015 2:29:34 PM By: Evlyn Kanner MD, FACS Entered By: Evlyn Kanner on 05/25/2015 14:29:34 Ryan Arnold ([id number]) Ryan Arnold, Ryan Arnold ([id number]) -------------------------------------------------------------------------------- Physician Orders Details Patient Name: Ryan Arnold Date of Service: 05/25/2015 1:00 PM Medical Record Number: [id number] Patient Account Number: [id number] Date of Birth/Sex: 1961-09-01 (54 y.o. 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 Wound #2 Right,Anterior Lower Leg o Clean wound with Normal Saline. Anesthetic Wound #1 Left,Anterior Lower Leg o Topical Lidocaine 4% cream applied to wound bed prior to debridement Wound #2 Right,Anterior Lower Leg o Topical Lidocaine 4% cream applied to  wound bed prior to debridement Primary Wound Dressing Wound #1 Left,Anterior Lower Leg o Prisma Ag Wound #2 Right,Anterior Lower Leg o Prisma Ag Secondary Dressing Wound #1 Left,Anterior Lower Leg o Boardered Foam Dressing Wound #2 Right,Anterior Lower Leg o Boardered Foam Dressing Dressing Change Frequency Wound #1 Left,Anterior Lower Leg o Change dressing every other day. Ryan Arnold, Ryan Arnold ([id number]) Wound #2 Right,Anterior Lower Leg o Change dressing every other day. Follow-up Appointments Wound #1 Left,Anterior Lower Leg o Return Appointment in 1 week. Wound #2 Right,Anterior Lower Leg o Return Appointment in 1 week. Edema Control Wound #1 Left,Anterior Lower Leg o Patient to wear own compression stockings o Elevate legs to the level of the heart and pump ankles as often as possible o Support Garment 20-30 mm/Hg pressure to: Wound #2 Right,Anterior Lower Leg o Patient to wear own compression stockings o Elevate legs to the level of the heart and pump ankles as often as possible o Support Garment 20-30 mm/Hg pressure to: Psychologist, prison and probation services) Signed: 05/25/2015 4:10:06 PM By: Evlyn Kanner MD, FACS Signed: 05/28/2015 6:31:19 PM By: Elliot Gurney, RN, BSN, Kim RN, BSN Entered By: Elliot Gurney, RN, BSN, Kim on 05/25/2015 14:11:54 Ryan Arnold, Ryan Arnold ([id number]) -------------------------------------------------------------------------------- Problem List Details Patient Name: Ryan Arnold Date of Service: 05/25/2015 1:00 PM Medical Record Number: [id number] Patient Account Number: [id number] Date of Birth/Sex: Mar 31, 1961 (54 y.o. 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 05/25/2015 Yes disease L97.211 Non-pressure chronic ulcer of right calf limited to 05/25/2015 Yes breakdown of skin L97.221 Non-pressure chronic ulcer of left calf limited to 05/25/2015 Yes breakdown of skin I89.0 Lymphedema, not elsewhere classified 05/25/2015 Yes Z99.2 Dependence on renal dialysis 05/25/2015 Yes I50.32 Chronic diastolic (congestive) heart failure 05/25/2015 Yes Inactive Problems Resolved Problems Electronic Signature(s) Signed: 05/25/2015 2:17:20 PM By: Evlyn Kanner MD, FACS Previous Signature: 05/25/2015 2:17:10 PM Version By: Evlyn Kanner MD, FACS Previous Signature: 05/25/2015 1:56:22 PM Version By: Evlyn Kanner MD, FACS Entered By: Evlyn Kanner on 05/25/2015 14:17:20 Ryan Arnold ([id number] Arnold, Ryan Arnold ([id number]) -------------------------------------------------------------------------------- Progress Note Details Patient Name: Ryan Arnold Date of Service: 05/25/2015 1:00 PM Medical Record  Number: [id number] Patient Account Number: [id number] Date of Birth/Sex: [birth year] (54 y.o. Male) Treating RN: Curtis Sites Primary Care Physician: Natale Milch Other Clinician: Referring Physician: Windell Moment Treating Physician/Extender: Rudene Re in Treatment: 0 Subjective Chief Complaint Information obtained from Patient Patient presents to the wound care center for a consult due non healing wound. He's had swelling and open wounds on his lower extremities for the last several weeks. History of Present Illness (HPI) The following HPI elements were documented for the patient's wound: Location: ulceration both lower extremities Quality: Patient reports No Pain. Severity: Patient states wound (s) are getting better. Duration: Patient has had the wound for > 3 years prior to seeking treatment at the wound center Context: The wound appeared gradually over time Modifying Factors:  Other treatment(s) tried include:his had compression hose prescribed for him but does not wear it due to the weather Associated Signs and Symptoms: Patient reports having difficulty standing for long periods. 54 year old gentleman who has had problems with bilateral lower extremity edema and ulceration has recently been in hospital with multiple medical problems. 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 He also is known to have diastolic heart failure, BOOP and end-stage renal disease on hemodialysis. His hand infection and swelling of his lower extremities for several months. His most recent hemoglobin A1c was 7 and was taking appropriate treatments for diabetes and for his congestive heart failure. Wound History Patient presents with 2 open wounds that have been present for approximately 1 month. Patient has been treating wounds in the following manner: open to air. Laboratory tests have not been performed in the last month. Patient reportedly has not tested positive for an antibiotic resistant organism. Patient reportedly has not tested positive for osteomyelitis. Patient reportedly has not had testing performed to evaluate circulation in the legs. Patient experiences the following problems associated with their wounds: swelling. Patient History Information obtained from Patient. Allergies Percocet Ryan Arnold, Ryan Arnold ([id number]) Family History Diabetes - Mother, Heart Disease - Mother, Hypertension - Mother, No family history of Cancer, Hereditary Spherocytosis, Kidney Disease, Lung Disease, Seizures, Stroke, Thyroid Problems, Tuberculosis. Social History Never smoker, Marital Status - Married, Alcohol Use - Never, Drug Use - No History, Caffeine Use - Never. Medical History Eyes Patient has history of Cataracts - removed Respiratory Patient has history of Sleep Apnea - BiPAP Cardiovascular Patient has history of  Congestive Heart Failure, Hypertension Endocrine Patient has history of Type I Diabetes Neurologic Patient has history of Neuropathy Oncologic Denies history of Received Chemotherapy, Received Radiation Psychiatric Denies history of Anorexia/bulimia, Confinement Anxiety Patient is treated with Insulin. Blood sugar is tested. Hospitalization/Surgery History - 04/13/2015, [facility], SOB. Medical And Surgical History Notes Respiratory BOOP Review of Systems (ROS) Constitutional Symptoms (General Health) The patient has no complaints or symptoms. Eyes The patient has no complaints or symptoms. Ear/Nose/Mouth/Throat The patient has no complaints or symptoms. Hematologic/Lymphatic The patient has no complaints or symptoms. Cardiovascular Complains or has symptoms of LE edema. Gastrointestinal The patient has no complaints or symptoms. Genitourinary Complains or has symptoms of Kidney failure/ Dialysis - ESRD on HD. Immunological The patient has no complaints or symptoms. Integumentary (Skin) The patient has no complaints or symptoms. Ryan Arnold, Ryan Arnold ([id number]) Musculoskeletal The patient has no complaints or symptoms. Oncologic The patient has no complaints or symptoms. Psychiatric The patient has no complaints or symptoms. Medications clonazepam 0.5 mg tablet oral 1 1 tablet oral gabapentin  100 mg capsule oral 1 1 capsule oral atorvastatin 20 mg tablet oral 1 1 tablet oral Lopressor 50 mg tablet oral 1 1 tablet oral Sensipar 30 mg tablet oral 1 1 tablet oral amlodipine 5 mg tablet oral 1 1 tablet oral Renvela 800 mg tablet oral 1 1 tablet oral prednisone 20 mg tablet oral 1 1 tablet oral allopurinol 100 mg tablet oral 1 1 tablet oral Lantus Solostar 100 unit/mL (3 mL) subcutaneous insulin pen subcutaneous insulin pen subcutaneous Novolog 100 unit/mL subcutaneous solution subcutaneous solution subcutaneous furosemide 40 mg tablet oral 1 1 tablet oral furosemide 80 mg tablet  oral 1 1 tablet oral multivitamin tablet oral 1 1 tablet oral bupropion HCl 75 mg tablet oral 1 1 tablet oral duloxetine 60 mg capsule,delayed release oral 2 2 capsule,delayed release(DR/EC) oral Objective Constitutional Pulse regular. Respirations normal and unlabored. Afebrile. Vitals Time Taken: 1:22 PM, Height: 73 in, Source: Stated, Weight: 298 lbs, Source: Stated, BMI: 39.3, Temperature: 98.3 F, Pulse: 81 bpm, Respiratory Rate: 20 breaths/min, Blood Pressure: 168/78 mmHg. Eyes Nonicteric. Reactive to light. Ears, Nose, Mouth, and Throat Lips, teeth, and gums WNL.[name] Moist mucosa without lesions . Neck supple and nontender. No palpable supraclavicular or cervical adenopathy. Normal sized without goiter. Ryan Arnold, Ryan Arnold. ([id number]) Respiratory WNL. No retractions.. Cardiovascular Pedal Pulses WNL.his ABIs are noncompressible. he has +3 pitting edema of the lower extremities and feet. Chest Breasts symmetical and no nipple discharge.. Breast tissue WNL, no masses, lumps, or tenderness.. Gastrointestinal (GI) Abdomen without masses or tenderness.. No liver or spleen enlargement or tenderness.. Lymphatic No adneopathy. No adenopathy. No adenopathy. Musculoskeletal Adexa without tenderness or enlargement.. Digits and nails w/o clubbing, cyanosis, infection, petechiae, ischemia, or inflammatory conditions.[name] Psychiatric Judgement and insight Intact.. No evidence of depression, anxiety, or agitation.. General Notes: He has small superficial skin breakdown and ulcerations not going deep to the subcutaneous tissue. Around the anterior part of his lower extremity and are fairly superficial. Integumentary (Hair, Skin) No suspicious lesions. No crepitus or fluctuance. No peri-wound warmth or erythema. No masses.. Wound #1 status is Open. Original cause of wound was Gradually Appeared. The wound is located on the Left,Anterior Lower Leg. The wound measures 4cm length x 5.5cm width x  0.1cm depth; 17.279cm^2 area and 1.728cm^3 volume. The wound is limited to skin breakdown. There is no tunneling or undermining noted. There is a medium amount of serous drainage noted. The wound margin is flat and intact. There is medium (34-66%) red, pink granulation within the wound bed. There is a medium (34-66%) amount of necrotic tissue within the wound bed including Eschar and Adherent Slough. The periwound skin appearance did not exhibit: Callus, Crepitus, Excoriation, Fluctuance, Friable, Induration, Localized Edema, Rash, Scarring, Dry/Scaly, Maceration, Moist, Atrophie Blanche, Cyanosis, Ecchymosis, Hemosiderin Staining, Mottled, Pallor, Rubor, Erythema. Periwound temperature was noted as No Abnormality. The periwound has tenderness on palpation. Wound #2 status is Open. Original cause of wound was Gradually Appeared. The wound is located on the Right,Anterior Lower Leg. The wound measures 0.2cm length x 0.2cm width x 0.1cm depth; 0.031cm^2 area and 0.003cm^3 volume. The wound is limited to skin breakdown. There is no tunneling or undermining noted. There is a medium amount of serous drainage noted. The wound margin is flat and intact. There is medium (34-66%) red, pink granulation within the wound bed. There is a medium (34-66%) amount of necrotic tissue within the wound bed including Eschar and Adherent Slough. The periwound skin appearance did not exhibit: Callus, Crepitus, Excoriation, Fluctuance, Friable, Induration,  Localized Edema, Rash, Scarring, Dry/Scaly, Maceration, Moist, Atrophie Blanche, Cyanosis, Ecchymosis, Hemosiderin Agyeman, Bertie W. ([id number]) Staining, Mottled, Pallor, Rubor, Erythema. Periwound temperature was noted as No Abnormality. 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 he has seen the vascular surgeon who was done a workup and recommended compression stockings only. Due to his CHF lymphedema pumps are contraindicated. I motivated the patient to use the compression hose on a regular basis and have discussed elevation of limbs both during the day and night. Will treat his ulcerations with Prisma Ag and a foam dressing. He says he would be compliant with wearing his stockings and come back to see as an regular basis. Plan Wound Cleansing: Wound #1 Left,Anterior Lower Leg: Clean wound with Normal Saline. Wound #2 Right,Anterior Lower Leg: Clean wound with Normal Saline. Anesthetic: Wound #1 Left,Anterior Lower Leg: Topical Lidocaine 4% cream applied to wound bed prior to debridement Wound #2 Right,Anterior Lower Leg: Topical Lidocaine 4% cream applied to wound bed prior to debridement Primary Wound Dressing: Wound #1 Left,Anterior Lower Leg: Prisma Ag Wound #2 Right,Anterior Lower Leg: Prisma Ag Secondary Dressing: Wound #1 Left,Anterior Lower Leg: Ryan Arnold, Ryan Arnold. ([id number]) Boardered Foam Dressing Wound #2 Right,Anterior Lower Leg: Boardered Foam Dressing Dressing Change Frequency: Wound #1 Left,Anterior Lower Leg: Change dressing every other day. Wound #2 Right,Anterior Lower Leg: Change dressing every other day. Follow-up Appointments: Wound #1 Left,Anterior Lower Leg: Return Appointment in 1 week. Wound #2 Right,Anterior Lower Leg: Return Appointment in 1 week. Edema Control: Wound #1 Left,Anterior Lower Leg: Patient to wear own compression  stockings Elevate legs to the level of the heart and pump ankles as often as possible Support Garment 20-30 mm/Hg pressure to: Wound #2 Right,Anterior Lower Leg: Patient to wear own compression stockings Elevate legs to the level of the heart and pump ankles as often as possible Support Garment 20-30 mm/Hg pressure to: This middle-aged gentleman has several comorbidities including CHF, lymphedema, chronic kidney disease dependent on dialysis, carotid artery disease. he has seen the vascular surgeon who was done a workup and recommended compression stockings only. Due to his CHF lymphedema pumps are contraindicated. I motivated the patient to use the compression hose on a regular basis and have discussed elevation of limbs both during the day and night. Will treat his ulcerations with Prisma Ag and a foam dressing. He says he would be compliant with wearing his stockings and come back to see as an regular basis. Electronic Signature(s) Signed: 05/25/2015 2:31:33 PM By: Evlyn Kanner MD, FACS Entered By: Evlyn Kanner on 05/25/2015 14:31:33 Ryan Arnold ([id number]) -------------------------------------------------------------------------------- ROS/PFSH Details Patient Name: Ryan Arnold Date of Service: 05/25/2015 1:00 PM Medical Record Number: [id number] Patient Account Number: [id number] Date of Birth/Sex: 12/07/1960 (54 y.o. Male) Treating RN: Curtis Sites Primary Care Physician: Natale Milch Other Clinician: Referring Physician: Windell Moment Treating Physician/Extender: Rudene Re in Treatment: 0 Information Obtained From Patient Wound History Do you currently have one or more open woundso Yes How many open wounds do you currently haveo 2 Approximately  how long have you had your woundso 1 month How have you been treating your wound(s) until nowo open to air Has your wound(s) ever healed and then re-openedo No Have you had any lab work done in the past montho  No Have you tested positive for an antibiotic resistant organism (MRSA, VRE)o No Have you tested positive for osteomyelitis (bone infection)o No Have you had any tests for circulation on your legso No Have you had other problems associated with your woundso Swelling Cardiovascular Complaints and Symptoms: Positive for: LE edema Medical History: Positive for: Congestive Heart Failure; Hypertension Genitourinary Complaints and Symptoms: Positive for: Kidney failure/ Dialysis - ESRD on HD Psychiatric Complaints and Symptoms: No Complaints or Symptoms Complaints and Symptoms: Negative for: Anxiety; Claustrophobia Medical History: Negative for: Anorexia/bulimia; Confinement Anxiety Constitutional Symptoms (General Health) Ryan Arnold, Ryan Arnold ([id number]) Complaints and Symptoms: No Complaints or Symptoms Eyes Complaints and Symptoms: No Complaints or Symptoms Medical History: Positive for: Cataracts - removed Ear/Nose/Mouth/Throat Complaints and Symptoms: No Complaints or Symptoms Hematologic/Lymphatic Complaints and Symptoms: No Complaints or Symptoms Respiratory Medical History: Positive for: Sleep Apnea - BiPAP Past Medical History Notes: BOOP Gastrointestinal Complaints and Symptoms: No Complaints or Symptoms Endocrine Medical History: Positive for: Type I Diabetes Time with diabetes: since age 28 Treated with: Insulin Blood sugar tested every day: Yes Tested : Immunological Complaints and Symptoms: No Complaints or Symptoms Integumentary (Skin) Complaints and Symptoms: No Complaints or Symptoms Ryan Arnold, Ryan Arnold. ([id number]) Musculoskeletal Complaints and Symptoms: No Complaints or Symptoms Neurologic Medical History: Positive for: Neuropathy Oncologic Complaints and Symptoms: No Complaints or Symptoms Medical History: Negative for: Received Chemotherapy; Received Radiation HBO Extended History Items Eyes: Cataracts Immunizations Immunization  Notes: up to date but unknown exactly when - "a couple of years ago" Hospitalization / Surgery History Name of Hospital Purpose of Hospitalization/Surgery Date [facility] SOB 04/13/2015 Family and Social History Cancer: No; Diabetes: Yes - Mother; Heart Disease: Yes - Mother; Hereditary Spherocytosis: No; Hypertension: Yes - Mother; Kidney Disease: No; Lung Disease: No; Seizures: No; Stroke: No; Thyroid Problems: No; Tuberculosis: No; Never smoker; Marital Status - Married; Alcohol Use: Never; Drug Use: No History; Caffeine Use: Never; Financial Concerns: No; Food, Clothing or Shelter Needs: No; Support System Lacking: No; Transportation Concerns: No; Advanced Directives: No; Patient does not want information on Advanced Directives Physician Affirmation I have reviewed and agree with the above information. Electronic Signature(s) Signed: 05/25/2015 1:55:24 PM By: Evlyn Kanner MD, FACS Signed: 05/25/2015 3:37:30 PM By: Curtis Sites Entered By: Evlyn Kanner on 05/25/2015 13:55:24 Ryan Arnold ([id number]) -------------------------------------------------------------------------------- SuperBill Details Patient Name: Ryan Arnold Date of Service: 05/25/2015 Medical Record Number: [id number] Patient Account Number: [id number] Date of Birth/Sex: [birth year] (53 y.o. 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 Chronic diastolic (congestive)  heart failure Facility Procedures CPT4 Code: 41324401 Description: 02725 - WOUND CARE VISIT-LEV 4 EST PT Modifier: Quantity: 1 Physician Procedures CPT4 Code Description: 3664403 99204 - WC PHYS LEVEL 4 - NEW PT ICD-10 Description Diagnosis E10.622 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

## 2015-06-01 ENCOUNTER — Encounter: Payer: BC Managed Care – PPO | Admitting: Surgery

## 2015-06-01 DIAGNOSIS — E10622 Type 1 diabetes mellitus with other skin ulcer: Secondary | ICD-10-CM | POA: Diagnosis not present

## 2015-06-01 DIAGNOSIS — L97221 Non-pressure chronic ulcer of left calf limited to breakdown of skin: Secondary | ICD-10-CM | POA: Diagnosis not present

## 2015-06-01 DIAGNOSIS — Z992 Dependence on renal dialysis: Secondary | ICD-10-CM | POA: Diagnosis not present

## 2015-06-01 DIAGNOSIS — I5032 Chronic diastolic (congestive) heart failure: Secondary | ICD-10-CM | POA: Diagnosis not present

## 2015-06-01 DIAGNOSIS — L97211 Non-pressure chronic ulcer of right calf limited to breakdown of skin: Secondary | ICD-10-CM | POA: Diagnosis not present

## 2015-06-01 DIAGNOSIS — I890 Lymphedema, not elsewhere classified: Secondary | ICD-10-CM | POA: Diagnosis not present

## 2015-06-02 NOTE — Progress Notes (Signed)
DEMON, VOLANTE ([id number]) Visit Report for 06/01/2015 Arrival Information Details Patient Name: Ryan Arnold, Ryan Arnold Date of Service: 06/01/2015 3:00 PM Medical Record Number: [id number] Patient Account Number: [id number] Date of Birth/Sex: 03-12-1961 (53 y.o. Male) Treating RN: Curtis Sites Primary Care Physician: Everlene Other Other Clinician: Referring Physician: Natale Milch Treating Physician/Extender: Rudene Re in Treatment: 1 Visit Information History Since Last Visit Added or deleted any medications: No Patient Arrived: Ambulatory Any new allergies or adverse reactions: No Arrival Time: 15:19 Had a fall or experienced change in No Accompanied By: self activities of daily living that may affect Transfer Assistance: None risk of falls: Patient Identification Verified: Yes Signs or symptoms of abuse/neglect since last No Secondary Verification Process Yes visito Completed: Hospitalized since last visit: No Patient Has Alerts: Yes Pain Present Now: No Patient Alerts: DMII NO BP IN LEFT ARM ABI [place] bilateral Electronic Signature(s) Signed: 06/01/2015 4:58:40 PM By: Curtis Sites Entered By: Curtis Sites on 06/01/2015 15:21:31 Ryan Arnold ([id number]) -------------------------------------------------------------------------------- Clinic Level of Care Assessment Details Patient Name: Ryan Arnold Date of Service: 06/01/2015 3:00 PM Medical Record Number: [id number] Patient Account Number: [id number] Date of Birth/Sex: [birth year] (53 y.o. Male) Treating RN: Curtis Sites Primary Care Physician: Everlene Other Other Clinician: Referring Physician: Natale Milch Treating Physician/Extender: Rudene Re in Treatment: 1 Clinic Level of Care Assessment Items TOOL 4 Quantity Score []  - Use when only an EandM is performed on FOLLOW-UP visit 0 ASSESSMENTS - Nursing Assessment / Reassessment X - Reassessment of Co-morbidities (includes updates in  patient status) 1 10 X - Reassessment of Adherence to Treatment Plan 1 5 ASSESSMENTS - Wound and Skin Assessment / Reassessment []  - Simple Wound Assessment / Reassessment - one wound 0 []  - Complex Wound Assessment / Reassessment - multiple wounds 0 []  - Dermatologic / Skin Assessment (not related to wound area) 0 ASSESSMENTS - Focused Assessment X - Circumferential Edema Measurements - multi extremities 1 5 []  - Nutritional Assessment / Counseling / Intervention 0 X - Lower Extremity Assessment (monofilament, tuning fork, pulses) 1 5 []  - Peripheral Arterial Disease Assessment (using hand held doppler) 0 ASSESSMENTS - Ostomy and/or Continence Assessment and Care []  - Incontinence Assessment and Management 0 []  - Ostomy Care Assessment and Management (repouching, etc.) 0 PROCESS - Coordination of Care X - Simple Patient / Family Education for ongoing care 1 15 []  - Complex (extensive) Patient / Family Education for ongoing care 0 []  - Staff obtains Chiropractor, Records, Test Results / Process Orders 0 []  - Staff telephones HHA, Nursing Homes / Clarify orders / etc 0 []  - Routine Transfer to another Facility (non-emergent condition) 0 JOH, RAO. ([id number]) []  - Routine Hospital Admission (non-emergent condition) 0 []  - New Admissions / Manufacturing engineer / Ordering NPWT, Apligraf, etc. 0 []  - Emergency Hospital Admission (emergent condition) 0 X - Simple Discharge Coordination 1 10 []  - Complex (extensive) Discharge Coordination 0 PROCESS - Special Needs []  - Pediatric / Minor Patient Management 0 []  - Isolation Patient Management 0 []  - Hearing / Language / Visual special needs 0 []  - Assessment of Community assistance (transportation, D/C planning, etc.) 0 []  - Additional assistance / Altered mentation 0 []  - Support Surface(s) Assessment (bed, cushion, seat, etc.) 0 INTERVENTIONS - Wound Cleansing / Measurement []  - Simple Wound Cleansing - one wound 0 []  - Complex  Wound Cleansing - multiple wounds 0 []  - Wound Imaging (photographs - any number of wounds) 0 []  - Wound Tracing (instead  of photographs) 0  - Simple Wound Measurement - one wound 0  - Complex Wound Measurement - multiple wounds 0 INTERVENTIONS - Wound Dressings  - Small Wound Dressing one or multiple wounds 0  - Medium Wound Dressing one or multiple wounds 0  - Large Wound Dressing one or multiple wounds 0  - Application of Medications - topical 0  - Application of Medications - injection 0 INTERVENTIONS - Miscellaneous  - External ear exam 0 DEVERE, BREM. ([id number])  - Specimen Collection (cultures, biopsies, blood, body fluids, etc.) 0  - Specimen(s) / Culture(s) sent or taken to Lab for analysis 0  - Patient Transfer (multiple staff / Michiel Sites Lift / Similar devices) 0  - Simple Staple / Suture removal (25 or less) 0  - Complex Staple / Suture removal (26 or more) 0  - Hypo / Hyperglycemic Management (close monitor of Blood Glucose) 0  - Ankle / Brachial Index (ABI) - do not check if billed separately 0 X - Vital Signs 1 5 Has the patient been seen at the hospital within the last three years: Yes Total Score: 55 Level Of Care: New/Established - Level 2 Electronic Signature(s) Signed: 06/01/2015 4:58:40 PM By: Curtis Sites Entered By: Curtis Sites on 06/01/2015 15:54:03 Ryan Arnold ([id number]) -------------------------------------------------------------------------------- Encounter Discharge Information Details Patient Name: Ryan Arnold Date of Service: 06/01/2015 3:00 PM Medical Record Number: [id number] Patient Account Number: [id number] Date of Birth/Sex: Mar 09, 1961 (54 y.o. Male) Treating RN: Curtis Sites Primary Care Physician: Everlene Other Other Clinician: Referring Physician: Natale Milch Treating Physician/Extender: Rudene Re in Treatment: 1 Encounter Discharge Information Items Discharge Pain Level:  0 Discharge Condition: Stable Ambulatory Status: Ambulatory Discharge Destination: Home Transportation: Private Auto Accompanied By: self Schedule Follow-up Appointment: No Medication Reconciliation completed No and provided to Patient/Care Braxtyn Dorff: Patient Clinical Summary of Care: Declined Electronic Signature(s) Signed: 06/01/2015 3:55:33 PM By: Gwenlyn Perking Entered By: Gwenlyn Perking on 06/01/2015 15:55:33 Ryan Arnold ([id number]) -------------------------------------------------------------------------------- Lower Extremity Assessment Details Patient Name: Ryan Arnold Date of Service: 06/01/2015 3:00 PM Medical Record Number: [id number] Patient Account Number: [id number] Date of Birth/Sex: 14-Apr-1961 (53 y.o. Male) Treating RN: Curtis Sites Primary Care Physician: Everlene Other Other Clinician: Referring Physician: Natale Milch Treating Physician/Extender: Rudene Re in Treatment: 1 Edema Assessment Assessed: [Left: No] [Right: No] Edema: [Left: Yes] [Right: Yes] Calf Left: Right: Point of Measurement: 35 cm From Medial Instep 42.3 cm 43.4 cm Ankle Left: Right: Point of Measurement: 10 cm From Medial Instep 27 cm 29.4 cm Vascular Assessment Pulses: Posterior Tibial Dorsalis Pedis Palpable: [Left:Yes] [Right:Yes] Extremity colors, hair growth, and conditions: Extremity Color: [Left:Hyperpigmented] [Right:Hyperpigmented] Hair Growth on Extremity: [Left:Yes] [Right:Yes] Temperature of Extremity: [Left:Warm] [Right:Warm] Capillary Refill: [Left:< 3 seconds] [Right:< 3 seconds] Toe Nail Assessment Left: Right: Thick: No No Discolored: No No Deformed: No No Improper Length and Hygiene: No No Electronic Signature(s) Signed: 06/01/2015 4:58:40 PM By: Curtis Sites Entered By: Curtis Sites on 06/01/2015 15:34:00 Ryan Arnold ([id number]) -------------------------------------------------------------------------------- Multi Wound Chart  Details Patient Name: Ryan Arnold Date of Service: 06/01/2015 3:00 PM Medical Record Number: [id number] Patient Account Number: [id number] Date of Birth/Sex: April 16, 1961 (54 y.o. Male) Treating RN: Curtis Sites Primary Care Physician: Everlene Other Other Clinician: Referring Physician: Natale Milch Treating Physician/Extender: Rudene Re in Treatment: 1 Vital Signs Height(in): 73 Pulse(bpm): 87 Weight(lbs): 298 Blood Pressure 137/68 (mmHg): Body Mass Index(BMI): 39 Temperature(F): 98.4 Respiratory Rate 20 (breaths/min): Photos: [1:No Photos] [2:No Photos] [N/A:N/A] Wound Location: [1:Left, Anterior Lower  Leg] [2:Right, Anterior Lower Leg] [N/A:N/A] Wounding Event: [1:Gradually Appeared] [2:Gradually Appeared] [N/A:N/A] Primary Etiology: [1:Venous Leg Ulcer] [2:Venous Leg Ulcer] [N/A:N/A] Date Acquired: [1:04/23/2015] [2:04/23/2015] [N/A:N/A] Weeks of Treatment: [1:1] [2:1] [N/A:N/A] Wound Status: [1:Open] [2:Open] [N/A:N/A] Clustered Wound: [1:Yes] [2:No] [N/A:N/A] Measurements L x W x D 0x0x0 [2:0x0x0] [N/A:N/A] (cm) Area (cm) : [1:0] [2:0] [N/A:N/A] Volume (cm) : [1:0] [2:0] [N/A:N/A] % Reduction in Area: [1:100.00%] [2:100.00%] [N/A:N/A] % Reduction in Volume: 100.00% [2:100.00%] [N/A:N/A] Classification: [1:Partial Thickness] [2:Partial Thickness] [N/A:N/A] Periwound Skin Texture: No Abnormalities Noted [2:No Abnormalities Noted] [N/A:N/A] Periwound Skin [1:No Abnormalities Noted] [2:No Abnormalities Noted] [N/A:N/A] Moisture: Periwound Skin Color: No Abnormalities Noted [2:No Abnormalities Noted] [N/A:N/A] Tenderness on [1:No] [2:No] [N/A:N/A] Treatment Notes Electronic Signature(s) Signed: 06/01/2015 4:58:40 PM By: Curtis Sites Entered By: Curtis Sites on 06/01/2015 15:34:36 Louderback, Leitha Schuller ([id number]) Mitzie Na, Leitha Schuller ([id number]) -------------------------------------------------------------------------------- Multi-Disciplinary Care Plan  Details Patient Name: Ryan Arnold Date of Service: 06/01/2015 3:00 PM Medical Record Number: [id number] Patient Account Number: [id number] Date of Birth/Sex: 07/01/1961 (54 y.o. Male) Treating RN: Curtis Sites Primary Care Physician: Everlene Other Other Clinician: Referring Physician: Natale Milch Treating Physician/Extender: Rudene Re in Treatment: 1 Active Inactive Electronic Signature(s) Signed: 06/01/2015 4:58:40 PM By: Curtis Sites Entered By: Curtis Sites on 06/01/2015 15:48:54 Ryan Arnold ([id number]) -------------------------------------------------------------------------------- Patient/Caregiver Education Details Patient Name: Ryan Arnold Date of Service: 06/01/2015 3:00 PM Medical Record Number: [id number] Patient Account Number: [id number] Date of Birth/Gender: Dec 25, 1960 (53 y.o. Male) Treating RN: Curtis Sites Primary Care Physician: Everlene Other Other Clinician: Referring Physician: Natale Milch Treating Physician/Extender: Rudene Re in Treatment: 1 Education Assessment Education Provided To: Patient Education Topics Provided Venous: Handouts: Other: continue with compression daily Methods: Explain/Verbal Responses: State content correctly Electronic Signature(s) Signed: 06/01/2015 4:58:40 PM By: Curtis Sites Entered By: Curtis Sites on 06/01/2015 15:55:30 Ryan Arnold ([id number]) -------------------------------------------------------------------------------- Wound Assessment Details Patient Name: Ryan Arnold Date of Service: 06/01/2015 3:00 PM Medical Record Number: [id number] Patient Account Number: [id number] Date of Birth/Sex: 08-04-1961 (54 y.o. Male) Treating RN: Curtis Sites Primary Care Physician: Everlene Other Other Clinician: Referring Physician: Natale Milch Treating Physician/Extender: Rudene Re in Treatment: 1 Wound Status Wound Number: 1 Primary Etiology: Venous Leg Ulcer Wound  Location: Left, Anterior Lower Leg Wound Status: Open Wounding Event: Gradually Appeared Date Acquired: 04/23/2015 Weeks Of Treatment: 1 Clustered Wound: Yes Photos Photo Uploaded By: Curtis Sites on 06/01/2015 16:55:25 Wound Measurements Length: (cm) 0 % Reduction i Width: (cm) 0 % Reduction i Depth: (cm) 0 Area: (cm) 0 Volume: (cm) 0 n Area: 100% n Volume: 100% Wound Description Classification: Partial Thickness Periwound Skin Texture Texture Color No Abnormalities Noted: No No Abnormalities Noted: No Moisture No Abnormalities Noted: No Electronic Signature(s) Signed: 06/01/2015 4:58:40 PM By: Lynann Beaver, Leitha Schuller ([id number]) Entered By: Curtis Sites on 06/01/2015 15:31:55 Ryan Arnold ([id number]) -------------------------------------------------------------------------------- Wound Assessment Details Patient Name: Ryan Arnold Date of Service: 06/01/2015 3:00 PM Medical Record Number: [id number] Patient Account Number: [id number] Date of Birth/Sex: 10-30-1960 (54 y.o. Male) Treating RN: Curtis Sites Primary Care Physician: Everlene Other Other Clinician: Referring Physician: Natale Milch Treating Physician/Extender: Rudene Re in Treatment: 1 Wound Status Wound Number: 2 Primary Etiology: Venous Leg Ulcer Wound Location: Right, Anterior Lower Leg Wound Status: Open Wounding Event: Gradually Appeared Date Acquired: 04/23/2015 Weeks Of Treatment: 1 Clustered Wound: No Photos Photo Uploaded By: Curtis Sites on 06/01/2015 16:55:26 Wound Measurements Length: (cm) 0 % Reduction Width: (cm) 0 % Reduction Depth: (cm) 0  Area: (cm) 0 Volume: (cm) 0 in Area: 100% in Volume: 100% Wound Description Classification: Partial Thickness Periwound Skin Texture Texture Color No Abnormalities Noted: No No Abnormalities Noted: No Moisture No Abnormalities Noted: No Electronic Signature(s) Signed: 06/01/2015 4:58:40 PM By: Lynann Beaver, Leitha Schuller ([id number]) Entered By: Curtis Sites on 06/01/2015 15:31:55 Ryan Arnold ([id number]) -------------------------------------------------------------------------------- Vitals Details Patient Name: Ryan Arnold Date of Service: 06/01/2015 3:00 PM Medical Record Number: [id number] Patient Account Number: [id number] Date of Birth/Sex: 22-Feb-1961 (54 y.o. Male) Treating RN: Curtis Sites Primary Care Physician: Everlene Other Other Clinician: Referring Physician: Natale Milch Treating Physician/Extender: Rudene Re in Treatment: 1 Vital Signs Time Taken: 15:21 Temperature (F): 98.4 Height (in): 73 Pulse (bpm): 87 Weight (lbs): 298 Respiratory Rate (breaths/min): 20 Body Mass Index (BMI): 39.3 Blood Pressure (mmHg): 137/68 Reference Range: 80 - 120 mg / dl Electronic Signature(s) Signed: 06/01/2015 4:58:40 PM By: Curtis Sites Entered By: Curtis Sites on 06/01/2015 15:22:28

## 2015-06-02 NOTE — Progress Notes (Signed)
Ryan Arnold ([id number]) Visit Report for 06/01/2015 Chief Complaint Document Details Patient Name: Ryan Arnold Date of Service: 06/01/2015 3:00 PM Medical Record Number: [id number] Patient Account Number: [id number] Date of Birth/Sex: 1961/03/10 (53 y.o. Male) Treating RN: Curtis Sites Primary Care Physician: Everlene Other Other Clinician: Referring Physician: Natale Milch Treating Physician/Extender: Rudene Re in Treatment: 1 Information Obtained from: Patient Chief Complaint Patient presents to the wound care center for a consult due non healing wound. He's had swelling and open wounds on his lower extremities for the last several weeks. Electronic Signature(s) Signed: 06/01/2015 4:25:01 PM By: Evlyn Kanner MD, FACS Entered By: Evlyn Kanner on 06/01/2015 15:56:55 Ryan Arnold ([id number]) -------------------------------------------------------------------------------- HPI Details Patient Name: Ryan Arnold Date of Service: 06/01/2015 3:00 PM Medical Record Number: [id number] Patient Account Number: [id number] Date of Birth/Sex: April 18, 1961 (54 y.o. Male) Treating RN: Curtis Sites Primary Care Physician: Everlene Other Other Clinician: Referring Physician: Natale Milch Treating Physician/Extender: Rudene Re in Treatment: 1 History of Present Illness Location: ulceration both lower extremities Quality: Patient reports No Pain. Severity: Patient states wound (s) are getting better. Duration: Patient has had the wound for > 3 years prior to seeking treatment at the wound center Context: The wound appeared gradually over time Modifying Factors: Other treatment(s) tried include:his had compression hose prescribed for him but does not wear it due to the weather Associated Signs and Symptoms: Patient reports having difficulty standing for long periods. HPI Description: 54 year old gentleman who has had problems with bilateral lower extremity edema  and ulceration has recently been in hospital with multiple medical problems. 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 He also is known to have diastolic heart failure, BOOP and end-stage renal disease on hemodialysis. His hand infection and swelling of his lower extremities for several months. His most recent hemoglobin A1c was 7 and was taking appropriate treatments for diabetes and for his congestive heart failure. Electronic Signature(s) Signed: 06/01/2015 4:25:01 PM By: Evlyn Kanner MD, FACS Entered By: Evlyn Kanner on 06/01/2015 15:57:00 Ryan Arnold ([id number]) -------------------------------------------------------------------------------- Physical Exam Details Patient Name: Ryan Arnold Date of Service: 06/01/2015 3:00 PM Medical Record Number: [id number] Patient Account Number: [id number] Date of Birth/Sex: 19-Oct-1960 (54 y.o. Male) Treating RN: Curtis Sites Primary Care Physician: Everlene Other Other Clinician: Referring Physician: Natale Milch Treating Physician/Extender: Rudene Re in Treatment: 1 Constitutional . Pulse regular. Respirations normal and unlabored. Afebrile. . Eyes Nonicteric. Reactive to light. Ears, Nose, Mouth, and Throat Lips, teeth, and gums WNL.[name] Moist mucosa without lesions . Neck supple and nontender. No palpable supraclavicular or cervical adenopathy. Normal sized without goiter. Respiratory WNL. No retractions.. Cardiovascular Pedal Pulses WNL. +1 pitting edema. Lymphatic No adneopathy. No adenopathy. No adenopathy. Musculoskeletal Adexa without tenderness or enlargement.. Digits and nails w/o clubbing, cyanosis, infection, petechiae, ischemia, or inflammatory conditions.. Integumentary (Hair, Skin) No suspicious lesions. No crepitus or fluctuance. No peri-wound warmth or erythema. No masses.[name] Psychiatric Judgement and insight Intact.. No evidence of  depression, anxiety, or agitation.. Notes The superficial ulcerations have completely disappeared and the wounds are all healed. His edema is much less but manageable and his legs overall looked very good. Electronic Signature(s) Signed: 06/01/2015 4:25:01 PM By: Evlyn Kanner MD, FACS Entered By: Evlyn Kanner on 06/01/2015 15:58:21 Ryan Arnold ([id number]) -------------------------------------------------------------------------------- Physician Orders Details Patient Name: Ryan Arnold Date of Service: 06/01/2015 3:00 PM Medical Record Number: [id number] Patient Account Number:  [id number] Date of Birth/Sex: [birth year] (54 y.o. Male) Treating RN: Curtis Sites Primary Care Physician: Everlene Other Other Clinician: Referring Physician: Natale Milch Treating Physician/Extender: Rudene Re in Treatment: 1 Verbal / Phone Orders: Yes Clinician: Curtis Sites Read Back and Verified: Yes Diagnosis Coding Discharge From [facility] Services o Discharge from Wound Care Center Electronic Signature(s) Signed: 06/01/2015 4:25:01 PM By: Evlyn Kanner MD, FACS Signed: 06/01/2015 4:58:40 PM By: Curtis Sites Entered By: Curtis Sites on 06/01/2015 15:49:10 Ryan Arnold ([id number]) -------------------------------------------------------------------------------- Problem List Details Patient Name: Ryan Arnold Date of Service: 06/01/2015 3:00 PM Medical Record Number: [id number] Patient Account Number: [id number] Date of Birth/Sex: [birth year] (53 y.o. 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 05/25/2015 Yes disease L97.211  Non-pressure chronic ulcer of right calf limited to 05/25/2015 Yes breakdown of skin L97.221 Non-pressure chronic ulcer of left calf limited to 05/25/2015 Yes breakdown of skin I89.0 Lymphedema, not elsewhere classified 05/25/2015 Yes Z99.2 Dependence on renal dialysis 05/25/2015 Yes I50.32 Chronic diastolic (congestive) heart failure 05/25/2015 Yes Inactive Problems Resolved Problems Electronic Signature(s) Signed: 06/01/2015 4:25:01 PM By: Evlyn Kanner MD, FACS Entered By: Evlyn Kanner on 06/01/2015 15:56:48 Ryan Arnold ([id number]) -------------------------------------------------------------------------------- Progress Note Details Patient Name: Ryan Arnold Date of Service: 06/01/2015 3:00 PM Medical Record Number: [id number] Patient Account Number: [id number] Date of Birth/Sex: August 14, 1961 (54 y.o. Male) Treating RN: Curtis Sites Primary Care Physician: Everlene Other Other Clinician: Referring Physician: Natale Milch Treating Physician/Extender: Rudene Re in Treatment: 1 Subjective Chief Complaint Information obtained from Patient Patient presents to the wound care center for a consult due non healing wound. He's had swelling and open wounds on his lower extremities for the last several weeks. History of Present Illness (HPI) The following HPI elements were documented for the patient's wound: Location: ulceration both lower extremities Quality: Patient reports No Pain. Severity: Patient states wound (s) are getting better. Duration: Patient has had the wound for > 3 years prior to seeking treatment at the wound center Context: The wound appeared gradually over time Modifying Factors: Other treatment(s) tried include:his had compression hose prescribed for him but does not wear it due to the weather Associated Signs and Symptoms: Patient reports having difficulty standing for long periods. 54 year old gentleman who has had problems with bilateral lower  extremity edema and ulceration has recently been in hospital with multiple medical problems. 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 He also is known to have diastolic heart failure, BOOP and end-stage renal disease on hemodialysis. His hand infection and swelling of his lower extremities for several months. His most recent hemoglobin A1c was 7 and was taking appropriate treatments for diabetes and for his congestive heart failure. Objective Constitutional Pulse regular. Respirations normal and unlabored. Afebrile. Vitals Time Taken: 3:21 PM, Height: 73 in, Weight: 298 lbs, BMI: 39.3, Temperature: 98.4 F, Pulse: 87 bpm, Respiratory Rate: 20 breaths/min, Blood Pressure: 137/68 mmHg. Eyes AMAY, MIJANGOS. ([id number]) Nonicteric. Reactive to light. Ears, Nose, Mouth, and Throat Lips, teeth, and gums WNL.[name] Moist mucosa without lesions . Neck supple and nontender. No palpable supraclavicular or cervical adenopathy. Normal sized without goiter. Respiratory WNL. No retractions.. Cardiovascular Pedal Pulses  WNL. +1 pitting edema. Lymphatic No adneopathy. No adenopathy. No adenopathy. Musculoskeletal Adexa without tenderness or enlargement.. Digits and nails w/o clubbing, cyanosis, infection, petechiae, ischemia, or inflammatory conditions.[name] Psychiatric Judgement and insight Intact.. No evidence of depression, anxiety, or agitation.. General Notes: The superficial ulcerations have completely disappeared and the wounds are all healed. His edema is much less but manageable and his legs overall looked very good. Integumentary (Hair, Skin) No suspicious lesions. No crepitus or fluctuance. No peri-wound warmth or erythema. No masses.. Wound #1 status is Open. Original cause of wound was Gradually Appeared. The wound is located on the Left,Anterior Lower Leg. The wound measures 0cm length x 0cm width x 0cm depth; 0cm^2 area  and 0cm^3 volume. Wound #2 status is Open. Original cause of wound was Gradually Appeared. The wound is located on the Right,Anterior Lower Leg. The wound measures 0cm length x 0cm width x 0cm depth; 0cm^2 area and 0cm^3 volume. Assessment Active Problems ICD-10 E10.622 - Type 1 diabetes mellitus with other skin ulcer E10.22 - Type 1 diabetes mellitus with diabetic chronic kidney disease L97.211 - Non-pressure chronic ulcer of right calf limited to breakdown of skin AMARI, ZAGAL. ([id number]) L97.221 - Non-pressure chronic ulcer of left calf limited to breakdown of skin I89.0 - Lymphedema, not elsewhere classified Z99.2 - Dependence on renal dialysis I50.32 - Chronic diastolic (congestive) heart failure Having used his compression effectively and following instructions his wounds have completely healed and I have discharged him from the wound care services and will see him back as needed. We have spent some time discussing the management of his edema and the fact that he needs elevation and compression at all times. He says he'll be very compliant. Plan Discharge From [facility] Services: Discharge from Wound Care Center Having used his compression effectively and following instructions his wounds have completely healed and I have discharged him from the wound care services and will see him back as needed. We have spent some time discussing the management of his edema and the fact that he needs elevation and compression at all times. He says he'll be very compliant. Electronic Signature(s) Signed: 06/01/2015 4:25:01 PM By: Evlyn Kanner MD, FACS Entered By: Evlyn Kanner on 06/01/2015 16:00:03 Ryan Arnold ([id number]) -------------------------------------------------------------------------------- SuperBill Details Patient Name: Ryan Arnold Date of Service: 06/01/2015 Medical Record Number: [id number] Patient Account Number: [id number] Date of Birth/Sex: Apr 18, 1961 (54 y.o.  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 L97.211 Non-pressure chronic ulcer of right calf limited to breakdown of skin L97.221 Non-pressure chronic ulcer of left calf limited to breakdown of skin I89.0 Lymphedema, not elsewhere classified Z99.2 Dependence on renal dialysis I50.32 Chronic diastolic (congestive) heart failure Facility Procedures CPT4 Code: [id number] Description: 84132 - WOUND CARE VISIT-LEV 2 EST PT Modifier: Quantity: 1 Physician Procedures CPT4 Code Description: 4401027 99213 - WC PHYS LEVEL 3 - EST PT ICD-10 Description Diagnosis E10.622 Type 1 diabetes mellitus with other skin ulcer L97.211 Non-pressure chronic ulcer of right calf limited t L97.221 Non-pressure chronic ulcer of left calf  limited to I89.0 Lymphedema, not elsewhere classified Modifier: o breakdown of s breakdown of sk Quantity: 1 kin in Electronic Signature(s) Signed: 06/01/2015 4:25:01 PM By: Evlyn Kanner MD, FACS Entered By: Evlyn Kanner on 06/01/2015 16:00:23

## 2015-06-05 ENCOUNTER — Encounter: Payer: Self-pay | Admitting: Family Medicine

## 2015-06-05 ENCOUNTER — Ambulatory Visit (INDEPENDENT_AMBULATORY_CARE_PROVIDER_SITE_OTHER): Payer: BC Managed Care – PPO | Admitting: Family Medicine

## 2015-06-05 ENCOUNTER — Other Ambulatory Visit: Payer: Self-pay | Admitting: *Deleted

## 2015-06-05 VITALS — BP 122/68 | HR 75 | Temp 98.1°F | Ht 73.0 in | Wt 306.2 lb

## 2015-06-05 DIAGNOSIS — Z79899 Other long term (current) drug therapy: Secondary | ICD-10-CM

## 2015-06-05 DIAGNOSIS — E108 Type 1 diabetes mellitus with unspecified complications: Secondary | ICD-10-CM

## 2015-06-05 DIAGNOSIS — R1310 Dysphagia, unspecified: Secondary | ICD-10-CM | POA: Diagnosis not present

## 2015-06-05 DIAGNOSIS — N529 Male erectile dysfunction, unspecified: Secondary | ICD-10-CM | POA: Diagnosis not present

## 2015-06-05 DIAGNOSIS — Z0000 Encounter for general adult medical examination without abnormal findings: Secondary | ICD-10-CM | POA: Insufficient documentation

## 2015-06-05 MED ORDER — INSULIN ASPART 100 UNIT/ML FLEXPEN: 22.0000 [IU] | PEN_INJECTOR | Freq: Three times a day (TID) | SUBCUTANEOUS | Status: DC

## 2015-06-05 MED ORDER — ATORVASTATIN CALCIUM 40 MG PO TABS: 40.0000 mg | ORAL_TABLET | Freq: Every day | ORAL | Status: DC

## 2015-06-05 NOTE — Assessment & Plan Note (Signed)
Stable at this time. Last A1C was 7.0. Followed by Endo.

## 2015-06-05 NOTE — Assessment & Plan Note (Signed)
Referring to [facility].

## 2015-06-05 NOTE — Assessment & Plan Note (Signed)
Will request records regarding Tdap and Pneumococcal (I anticipate that he had these prior to HD). No need for labs currently. Colonoscopy UTD. Patient had Flu vaccine earlier this month.

## 2015-06-05 NOTE — Patient Instructions (Signed)
It was nice to see you today.  I have increased your lipitor to 40 mg daily.  Take the Allopurinol on the days that you do not dialyze.  We will be in contact regarding your referral to urology.  Follow up in ~ 6 months or sooner if needed.  Take care  Dr. Adriana Simas

## 2015-06-05 NOTE — Progress Notes (Signed)
Subjective:  Patient ID: Ryan Arnold, male    DOB: [birth year]  Age: 54 y.o. MRN: [id number]  CC: Establish care  HPI Ryan Arnold is a 54 y.o. male presents to the clinic today to establish care.  Additionally, he has a few concerns/complaints today (see below).  1) Preventative Healthcare  Colonoscopy: 2013.   Immunizations: Need to obtain records regarding Tdap and Pneumococcal vaccinations.   Exercise: Limited due to co-morbidities.  Alcohol use: No.  Smoking/tobacco use: Nonsmoker.  2) Impotence  Patient reports that he had been struggling for months to years with impotence. He has difficulty obtaining and maintaining an erection.  He also reports associated decrease in his sex drive.  He is concerned about underlying Low T and would like to discuss this today.  He would also like to discuss treatment options regarding impotence.  No exacerbating or improving/relieving factors.  He has used medication in the past with no improvement.  3) Trouble swallowing  Has been occurring intermittently for the past few months.  He states that he has intermittent trouble with swallowing secretions.  He states that he does not have any difficulty swallowing foods or drinking liquids.  No known inciting factor.  No relieving factors.  No known exacerbating factors. He states that he has had workup for this in the past which was negative.  No recent fever, chills or other associated symptoms.   PMH, Surgical Hx, Family Hx, Social History reviewed and updated as below. Past Medical History  Diagnosis Date  . Essential hypertension   . Hyperlipemia   . End stage renal disease     a. on dialysis; still makes urine.  [name] BOOP (bronchiolitis obliterans with organizing pneumonia)     a. 01/2018 s/p R thoracoscopy/Bx confirming BOOP;  b. 02/2018 high dose steroids started.  . Chronic diastolic CHF (congestive heart failure)     a. 12/2014 Echo: EF 50-55%, mild conc LVH,  mildly dil LA.  . Morbid obesity   . Secondary hyperparathyroidism   . Anemia of chronic disease   . Recurrent pneumonia     a. 2/2 BOOP.  [name] Chest pain     a. 06/2014 Myoview ([facility]) no ischemia/infarct, nl EF.  . Type 1 diabetes mellitus   . Thyroid disease   . OSA treated with BiPAP     Past Surgical History  Procedure Laterality Date  . Cataract Bilateral   . Shoulder surgery Left   . Knee surgery Right   . Gallbladder surgery    . Lung biopsy      Family History  Problem Relation Age of Onset  . Diabetes Mother     died @ 43.  [name] Hypertension Mother   . Hypertension Maternal Grandmother     Social History  Substance Use Topics  . Smoking status: Never Smoker   . Smokeless tobacco: Never Used  . Alcohol Use: No    Review of Systems  Respiratory: Positive for shortness of breath.   Musculoskeletal:       Muscle cramps.   All other systems negative.   Objective:   Today's Vitals: BP 122/68 mmHg  Pulse 75  Temp(Src) 98.1 F (36.7 C) (Oral)  Ht 6\' 1"  (1.854 m)  Wt 306 lb 4 oz (138.914 kg)  BMI 40.41 kg/m2  SpO2 97%  Physical Exam  Constitutional: He is oriented to person, place, and time. He appears well-developed and well-nourished. No distress.  HENT:  Head: Normocephalic and atraumatic.  Right Ear:  External ear normal.  Left Ear: External ear normal.  Eyes: No scleral icterus.  Neck: Neck supple. No thyromegaly present.  Cardiovascular: Normal rate and regular rhythm.   2+ LE edema bilaterally (extends to knee).  Pulmonary/Chest: Effort normal and breath sounds normal. No respiratory distress. He has no wheezes. He has no rales.  Abdominal: Soft. He exhibits no distension. There is no tenderness.  Lymphadenopathy:    He has no cervical adenopathy.  Neurological: He is alert and oriented to person, place, and time.  Skin: Skin is warm and dry. No rash noted.  Psychiatric: He has a normal mood and affect.  Vitals reviewed.  Diabetic Foot Check -    Appearance - no lesions, ulcers or calluses Skin - no unusual pallor or redness Monofilament testing -  Right/Left - Patient with no sensation to monofilament testing in all fields.   Assessment & Plan:   Problem List Items Addressed This Visit    Impotence - Primary    Referring to [facility].      Relevant Orders   Ambulatory referral to Urology   Preventative health care    Will request records regarding Tdap and Pneumococcal (I anticipate that he had these prior to HD). No need for labs currently. Colonoscopy UTD. Patient had Flu vaccine earlier this month.      Trouble swallowing    No red flags on exam. Offered work up and patient declined today. He elected for watchful waiting.      Type 1 diabetes mellitus with multiple complications    Stable at this time. Last A1C was 7.0. Followed by Endo.      Relevant Medications   valsartan (DIOVAN) 80 MG tablet   atorvastatin (LIPITOR) 40 MG tablet    Other Visit Diagnoses    Long-term use of high-risk medication        Relevant Orders    DG Bone Density       Outpatient Encounter Prescriptions as of 06/05/2015  Medication Sig  . allopurinol (ZYLOPRIM) 100 MG tablet Take 1 tablet (100 mg total) by mouth daily.  [name] amLODipine (NORVASC) 10 MG tablet Take 1 tablet (10 mg total) by mouth daily.  [name] aspirin 81 MG chewable tablet Chew 1 tablet (81 mg total) by mouth daily.  [name] buPROPion (WELLBUTRIN) 75 MG tablet Take 1 tablet (75 mg total) by mouth 2 (two) times daily.  . DULoxetine (CYMBALTA) 60 MG capsule Take 120 mg by mouth daily.  . furosemide (LASIX) 80 MG tablet Take 1 tablet (80 mg total) by mouth 4 (four) times a week. Tuesday, Thursday, Saturday, and Sunday.  . gabapentin (NEURONTIN) 100 MG capsule Take 100 mg by mouth daily.  . hydrALAZINE (APRESOLINE) 25 MG tablet Take 1 tablet (25 mg total) by mouth 3 (three) times daily.  [name] LANTUS SOLOSTAR 100 UNIT/ML Solostar Pen Inject 36 units at breakfast and 8  units at bedtime  . metoprolol (LOPRESSOR) 50 MG tablet Take 50 mg by mouth 2 (two) times daily.  . multivitamin (RENA-VIT) TABS tablet Take 1 tablet by mouth daily.  . pantoprazole (PROTONIX) 40 MG tablet Take 1 tablet (40 mg total) by mouth daily.  . predniSONE (DELTASONE) 20 MG tablet Take 2.5 tablets (50 mg total) by mouth daily with breakfast.  . SENSIPAR 30 MG tablet Take 30 mg by mouth daily.  . sevelamer carbonate (RENVELA) 800 MG tablet Take 800 mg by mouth 3 (three) times daily.  . valsartan (DIOVAN) 80 MG tablet Take 80  mg by mouth at bedtime.  . [DISCONTINUED] atorvastatin (LIPITOR) 20 MG tablet Take 1 tablet (20 mg total) by mouth at bedtime.  . [DISCONTINUED] insulin aspart (NOVOLOG) 100 UNIT/ML FlexPen Inject 22 Units into the skin 3 (three) times daily with meals.   [name] atorvastatin (LIPITOR) 40 MG tablet Take 1 tablet (40 mg total) by mouth daily.  . clonazePAM (KLONOPIN) 0.5 MG tablet Take 1 tablet (0.5 mg total) by mouth at bedtime as needed.  . [DISCONTINUED] lisinopril (PRINIVIL,ZESTRIL) 10 MG tablet Take 10 mg by mouth daily.   No facility-administered encounter medications on file as of 06/05/2015.    Follow-up: 6 months   Tommie Sams DO

## 2015-06-05 NOTE — Assessment & Plan Note (Signed)
No red flags on exam. Offered work up and patient declined today. He elected for watchful waiting.

## 2015-06-05 NOTE — Progress Notes (Signed)
Pre visit review using our clinic review tool, if applicable. No additional management support is needed unless otherwise documented below in the visit note. 

## 2015-06-07 ENCOUNTER — Other Ambulatory Visit: Payer: Self-pay | Admitting: Family Medicine

## 2015-06-07 DIAGNOSIS — F329 Major depressive disorder, single episode, unspecified: Secondary | ICD-10-CM | POA: Diagnosis not present

## 2015-06-07 DIAGNOSIS — R000 Tachycardia, unspecified: Secondary | ICD-10-CM | POA: Diagnosis not present

## 2015-06-07 DIAGNOSIS — G4733 Obstructive sleep apnea (adult) (pediatric): Secondary | ICD-10-CM | POA: Diagnosis not present

## 2015-06-14 ENCOUNTER — Ambulatory Visit (INDEPENDENT_AMBULATORY_CARE_PROVIDER_SITE_OTHER): Payer: BC Managed Care – PPO | Admitting: Endocrinology

## 2015-06-14 ENCOUNTER — Other Ambulatory Visit: Payer: Self-pay | Admitting: *Deleted

## 2015-06-14 ENCOUNTER — Other Ambulatory Visit (INDEPENDENT_AMBULATORY_CARE_PROVIDER_SITE_OTHER): Payer: BC Managed Care – PPO

## 2015-06-14 ENCOUNTER — Encounter: Payer: Self-pay | Admitting: Endocrinology

## 2015-06-14 VITALS — BP 118/76 | HR 84 | Temp 98.0°F | Resp 16 | Ht 73.0 in | Wt 303.8 lb

## 2015-06-14 DIAGNOSIS — E108 Type 1 diabetes mellitus with unspecified complications: Secondary | ICD-10-CM | POA: Diagnosis not present

## 2015-06-14 DIAGNOSIS — E7800 Pure hypercholesterolemia, unspecified: Secondary | ICD-10-CM

## 2015-06-14 DIAGNOSIS — E785 Hyperlipidemia, unspecified: Secondary | ICD-10-CM

## 2015-06-14 LAB — LIPID PANEL
CHOLESTEROL: 194 mg/dL (ref 0–200)
HDL: 99.3 mg/dL (ref >39.00)
LDL Cholesterol: 65 mg/dL (ref 0–99)
NONHDL: 95.17
Total CHOL/HDL Ratio: 2
Triglycerides: 151 mg/dL — ABNORMAL HIGH (ref 0.0–149.0)
VLDL: 30.2 mg/dL (ref 0.0–40.0)

## 2015-06-14 LAB — POCT GLYCOSYLATED HEMOGLOBIN (HGB A1C): Hemoglobin A1C: 8.7

## 2015-06-14 NOTE — Progress Notes (Signed)
Patient ID: Ryan Arnold, male   DOB: [birth year], 54 y.o.   MRN: [id number]    Chief complaint : Follow up of Type 1 Diabetes  History of Present Illness:          Date of diagnosis: Age 46      Prior history:   Recalls has been on insulin since diagnosis. Has tried other insulin therapies in the past. Was on insulin pump 2 years ago, and then taken off pump during hospitalization and never put back on it.  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 Novolog 20 units at breakfast and 26 units at lunch and supper + correction doses  Recent history:  He was started on large doses of prednisone in June for his pulmonary condition and blood sugars have been much higher since then He was seen by the endocrinologist in 6/16 and his Novolog will increase but his Lantus was not changed.  Current treatment regimen:  He has started taking his Lantus in the morning as directed instead of at noon  on Mondays/Wednesdays/Fridays when he is going for dialysis early morning  Also his breakfast meal is relatively small when going for dialysis and usually will not eat anything when not going for dialysis  He now says that he is not taking his suppertime Novolog until an hour after eating because he is typically going out to eat and will not take his insulin with him  His Novolog dose was changed somewhat on the last visit since previously was taking the same amount for mealtime coverage regardless of the meal  He is still generally taking the same amount of Novolog on the days he is not going for dialysis early morning when he is only eating some crackers with his prednisone  Glucose patterns:  Fasting glucose: Variable but has mostly high readings still, had fairly good readings about 10 days ago in the morning but only on 2 mornings  Hyperglycemia is much more consistent in the  afternoons and evenings especially after supper  Hypoglycemia: None  Glucose monitoring:  done about 2 times a day         Glucometer: One Touch.      Blood Glucose reading analysis from meter download:   Mean values apply above for all meters except median for One Touch  PRE-MEAL Fasting Lunch Dinner  PCS  Overall  Glucose range:  90-245   87-356   173-328   185-601    Mean/median:  195     379   245    Symptoms of hypoglycemia: Weakness and shakiness Self-care:   Meal times:  breakfast 5 am on dialysis days otherwise his first meal is at 12 noon, dinner 6 pm  Exercise:.  unable to do any         Most recent dietitian/nurse educator visit : Years ago           Diabetes labs:  Lab Results  Component Value Date   HGBA1C 8.7 06/14/2015   HGBA1C 7.0* 03/15/2015   HGBA1C 7.8* 01/08/2015   Lab Results  Component Value Date   LDLCALC SEE COMMENTS 03/19/2015   CREATININE 5.74* 03/21/2015        Medication List       This list is accurate as of: 06/14/15 10:26 AM.  Always use your most recent med list.  allopurinol 100 MG tablet  Commonly known as:  ZYLOPRIM  Take 1 tablet (100 mg total) by mouth daily.     amLODipine 10 MG tablet  Commonly known as:  NORVASC  Take 1 tablet (10 mg total) by mouth daily.     amoxicillin-clavulanate 500-125 MG per tablet  Commonly known as:  AUGMENTIN  Take 1 tablet by mouth 2 (two) times daily. for 10 days     aspirin 81 MG chewable tablet  Chew 1 tablet (81 mg total) by mouth daily.     atorvastatin 20 MG tablet  Commonly known as:  LIPITOR  Take 20 mg by mouth at bedtime.     atorvastatin 40 MG tablet  Commonly known as:  LIPITOR  Take 1 tablet (40 mg total) by mouth daily.     B-D ULTRAFINE III SHORT PEN 31G X 8 MM Misc  Generic drug:  Insulin Pen Needle  use as directed     buPROPion 75 MG tablet  Commonly known as:  WELLBUTRIN  Take 1 tablet (75 mg total) by mouth 2 (two) times daily.     clonazePAM  0.5 MG tablet  Commonly known as:  KLONOPIN  Take 1 tablet (0.5 mg total) by mouth at bedtime as needed.     DULoxetine 60 MG capsule  Commonly known as:  CYMBALTA  Take 120 mg by mouth daily.     furosemide 80 MG tablet  Commonly known as:  LASIX  Take 1 tablet (80 mg total) by mouth 4 (four) times a week. Tuesday, Thursday, Saturday, and Sunday.     gabapentin 100 MG capsule  Commonly known as:  NEURONTIN  Take 100 mg by mouth daily.     hydrALAZINE 25 MG tablet  Commonly known as:  APRESOLINE  Take 1 tablet (25 mg total) by mouth 3 (three) times daily.     insulin aspart 100 UNIT/ML FlexPen  Commonly known as:  NOVOLOG  Inject 22 Units into the skin 3 (three) times daily with meals.     LANTUS SOLOSTAR 100 UNIT/ML Solostar Pen  Generic drug:  Insulin Glargine  Inject 36 units at breakfast and 8 units at bedtime     metoprolol 50 MG tablet  Commonly known as:  LOPRESSOR  Take 50 mg by mouth 2 (two) times daily.     multivitamin Tabs tablet  Take 1 tablet by mouth daily.     pantoprazole 40 MG tablet  Commonly known as:  PROTONIX  Take 1 tablet (40 mg total) by mouth daily.     predniSONE 20 MG tablet  Commonly known as:  DELTASONE  Take 2.5 tablets (50 mg total) by mouth daily with breakfast.     SENSIPAR 30 MG tablet  Generic drug:  cinacalcet  Take 30 mg by mouth daily.     sevelamer carbonate 800 MG tablet  Commonly known as:  RENVELA  Take 800 mg by mouth 3 (three) times daily.     valsartan 80 MG tablet  Commonly known as:  DIOVAN  Take 80 mg by mouth at bedtime.        Allergies:  Allergies  Allergen Reactions  . Oxycodone-Acetaminophen Nausea And Vomiting    Past Medical History  Diagnosis Date  . Essential hypertension   . Hyperlipemia   . End stage renal disease     a. on dialysis; still makes urine.  [name] BOOP (bronchiolitis obliterans with organizing pneumonia)     a. 01/2018 s/p R thoracoscopy/Bx confirming  BOOP;  b. 02/2018 high dose  steroids started.  . Chronic diastolic CHF (congestive heart failure)     a. 12/2014 Echo: EF 50-55%, mild conc LVH, mildly dil LA.  . Morbid obesity   . Secondary hyperparathyroidism   . Anemia of chronic disease   . Recurrent pneumonia     a. 2/2 BOOP.  [name] Chest pain     a. 06/2014 Myoview ([facility]) no ischemia/infarct, nl EF.  . Type 1 diabetes mellitus   . Thyroid disease   . OSA treated with BiPAP     Past Surgical History  Procedure Laterality Date  . Cataract Bilateral   . Shoulder surgery Left   . Knee surgery Right   . Gallbladder surgery    . Lung biopsy      Family History  Problem Relation Age of Onset  . Diabetes Mother     died @ 51.  [name] Hypertension Mother   . Hypertension Maternal Grandmother     Social History:  reports that he has never smoked. He has never used smokeless tobacco. He reports that he does not drink alcohol or use illicit drugs.    Review of Systems:   CKD: He is on dialysis  He is on steroids for bronchiolitis obliterans and will need to be continued on these for another 6 months or so. He takes prednisone 30 mg in the morning more recently, previously on 50 mg  He [facility] by [facility] for leg ulcers  Lipids: Appear poorly controlled as of 6/16 but previously lipids were good.  Unable to identify LDL value on previous lab.  He thinks he is compliant with his Lipitor  Lab Results  Component Value Date   CHOL 284* 03/19/2015   HDL >40 03/19/2015   LDLCALC SEE COMMENTS 03/19/2015   TRIG 78 03/19/2015   CHOLHDL SEE COMMENTS 03/19/2015    Diabetes complications: Nephropathy   Physical Examination:  BP 118/76 mmHg  Pulse 84  Temp(Src) 98 F (36.7 C)  Resp 16  Ht 6\' 1"  (1.854 m)  Wt 303 lb 12.8 oz (137.803 kg)  BMI 40.09 kg/m2  SpO2 96%      Skin on lower legs shows no ulcers    ASSESSMENT:  Diabetes type 1 with consistently poor control on prednisone   Problems identified:  He is still having higher readings in  the afternoons and evenings and blood sugars are progressively higher during the day probably because of continuing prednisone  He does not understand the need to take Novolog before eating consistently especially at suppertime when he is eating out; this causes his blood sugars to be frequently over 300  His fasting blood sugars are still relatively high but somewhat inconsistent despite increasing his Lantus overall  Needs assessment of his meal planning, still not watching drinks with sugar consistently  He is not adjusting his mealtime doses based on what he is eating and is generally eating smaller meals in the morning and noon and larger meals in the evenings  He is very limited in his activity level and has difficulty losing weight  PLAN:   He will increase the doses of Lantus  Also increase mealtime coverage especially at suppertime  Make sure he takes Novolog before eating in the evening  Better meal planning and avoiding simple sugars  May consider reducing insulin doses when his prednisone is reduced further  Check lipid panel  Patient Instructions   LANTUS 40 UNITS IN AM AND 10 UNITS AT  NITE   NOVOLOG BEFORE EACH meal   Blood Glucose (mg/dL)  Breakfast  (Units Novolog Insulin)  Lunch  (Units Novolog Insulin)  Supper  (Units Novolog Insulin)      <70 Treat the low blood sugar. Recheck blood glucose in 15 mins. If sugar is more than 70, then take the number of units of insulin in the 70-90 row, if before a meal.   70-90  16 16 18   0  91-130 (Base Dose)  22 26 30   0   131-150  22 26 30   0   151-200  24 30 34    201-250 26 30 36    251-300  26 34 38    301-500  30 36 40                          No sweet tea     Counseling time on subjects discussed above is over 50% of today's 25 minute visit  Kynlei Piontek 06/14/2015, 10:26 AM

## 2015-06-14 NOTE — Patient Instructions (Addendum)
LANTUS 40 UNITS IN AM AND 10 UNITS AT NITE   NOVOLOG BEFORE EACH meal   Blood Glucose (mg/dL)  Breakfast  (Units Novolog Insulin)  Lunch  (Units Novolog Insulin)  Supper  (Units Novolog Insulin)      <70 Treat the low blood sugar. Recheck blood glucose in 15 mins. If sugar is more than 70, then take the number of units of insulin in the 70-90 row, if before a meal.   70-90  0  91-130 (Base Dose)  0   131-150  0   151-200  24 30 34    201-250 26 30 36    251-300  26 34 38    301-500  30 36 40                          No sweet tea

## 2015-06-15 NOTE — Progress Notes (Signed)
Quick Note:  Please let patient know that the lab result is normal and no further action needed ______ 

## 2015-06-21 ENCOUNTER — Other Ambulatory Visit: Payer: Self-pay | Admitting: Family Medicine

## 2015-06-26 ENCOUNTER — Encounter: Payer: BC Managed Care – PPO | Attending: Endocrinology | Admitting: Nutrition

## 2015-06-26 DIAGNOSIS — E108 Type 1 diabetes mellitus with unspecified complications: Secondary | ICD-10-CM | POA: Diagnosis not present

## 2015-06-26 DIAGNOSIS — Z713 Dietary counseling and surveillance: Secondary | ICD-10-CM | POA: Diagnosis not present

## 2015-07-02 ENCOUNTER — Ambulatory Visit: Payer: BC Managed Care – PPO | Admitting: Internal Medicine

## 2015-07-04 NOTE — Progress Notes (Signed)
Typical day:  On M, Wed., F: goes to dialysis 4AM:  Lantus: 41,          Sausage patty with pancake and diet soda  7AM: 6 peanut butter crackers 10:30 Home.   1PM: sandwich with few chips diet soda 6PM: Meat (6 ounces), 2-3 vegetables (one is starchy),  10PM: usually a snack at HS of crackers with cheese or pb  18u HS Toujeo Denies snacking between meals at home or after supper, except for HS snack, or will drop low  Insulin Toujeo: 41AM, 18HS,  Novolog per scale  Blood sugars:           FBS       2hr.pcB            acL          acS    1hr. pcS             HS  9/13   55/84  (Pt. Had no HS snack last night)       9/12  106                                                                355 9/11  296                                                                                     123 9/10             221                   115                            286 (ate at a buffet) 9/9     89 9/8     61 9/7                                                            125      245                 219 (took 7u Novolog) 9/6  71

## 2015-07-04 NOTE — Progress Notes (Deleted)
Subjective:     Patient ID: Ryan Arnold, male   DOB: [birth year], 54 y.o.   MRN: [id number]  HPI   Review of Systems     Objective:   Physical Exam     Assessment:     ***    Plan:     ***

## 2015-07-05 ENCOUNTER — Encounter: Payer: Self-pay | Admitting: Internal Medicine

## 2015-07-05 ENCOUNTER — Ambulatory Visit (INDEPENDENT_AMBULATORY_CARE_PROVIDER_SITE_OTHER): Payer: BC Managed Care – PPO | Admitting: Internal Medicine

## 2015-07-05 VITALS — BP 128/64 | HR 73 | Ht 73.0 in | Wt 303.0 lb

## 2015-07-05 DIAGNOSIS — J8489 Other specified interstitial pulmonary diseases: Secondary | ICD-10-CM | POA: Diagnosis not present

## 2015-07-05 DIAGNOSIS — G4733 Obstructive sleep apnea (adult) (pediatric): Secondary | ICD-10-CM | POA: Diagnosis not present

## 2015-07-05 MED ORDER — PREDNISONE 20 MG PO TABS: 30.0000 mg | ORAL_TABLET | Freq: Every day | ORAL | Status: DC

## 2015-07-05 NOTE — Progress Notes (Signed)
MRN# [id number] Ryan Arnold 06-May-1961   CC: Chief Complaint  Patient presents with  . Follow-up    breathing getting better; SOB w/activity w/o O2;   HPI:  Patient follow up for BOOp biopsy proven Doing well on 30 mg prednisone from 50 mg Patient denies feversm chills, NVD Patient states oxygen helping, states that he is compliant with biPAp for OSA    Medication:   Current Outpatient Rx  Name  Route  Sig  Dispense  Refill  . allopurinol (ZYLOPRIM) 100 MG tablet   Oral   Take 1 tablet (100 mg total) by mouth daily.   90 tablet   1   . amLODipine (NORVASC) 10 MG tablet   Oral   Take 1 tablet (10 mg total) by mouth daily.   30 tablet   1   . aspirin 81 MG chewable tablet   Oral   Chew 1 tablet (81 mg total) by mouth daily.   30 tablet   2   . atorvastatin (LIPITOR) 40 MG tablet   Oral   Take 1 tablet (40 mg total) by mouth daily.   90 tablet   3   . B-D ULTRAFINE III SHORT PEN 31G X 8 MM MISC      use as directed   100 each   11     Phone number to our clinic (as requested) is [phone] ...   . buPROPion (WELLBUTRIN) 75 MG tablet   Oral   Take 1 tablet (75 mg total) by mouth 2 (two) times daily.   60 tablet   0   . DULoxetine (CYMBALTA) 60 MG capsule   Oral   Take 120 mg by mouth daily.      0   . furosemide (LASIX) 80 MG tablet   Oral   Take 1 tablet (80 mg total) by mouth 4 (four) times a week. Tuesday, Thursday, Saturday, and Sunday.   36 tablet   3   . gabapentin (NEURONTIN) 100 MG capsule   Oral   Take 100 mg by mouth daily.      1   . hydrALAZINE (APRESOLINE) 25 MG tablet   Oral   Take 1 tablet (25 mg total) by mouth 3 (three) times daily.   90 tablet   1   . insulin aspart (NOVOLOG) 100 UNIT/ML FlexPen   Subcutaneous   Inject 22 Units into the skin 3 (three) times daily with meals.   15 mL   2   . LANTUS SOLOSTAR 100 UNIT/ML Solostar Pen      Inject 36 units at breakfast and 8 units at bedtime   15 mL   1     Dispense  as written.    Dose Change   . metoprolol (LOPRESSOR) 50 MG tablet   Oral   Take 50 mg by mouth 2 (two) times daily.         . multivitamin (RENA-VIT) TABS tablet   Oral   Take 1 tablet by mouth daily.         . pantoprazole (PROTONIX) 40 MG tablet      take 1 tablet by mouth once daily   30 tablet   0   . predniSONE (DELTASONE) 20 MG tablet   Oral   Take 1.5 tablets (30 mg total) by mouth daily with breakfast. Takes 1 1/2 tablets daily   90 tablet   0   . SENSIPAR 30 MG tablet   Oral   Take 30  mg by mouth daily.           Dispense as written.   . sevelamer carbonate (RENVELA) 800 MG tablet   Oral   Take 800 mg by mouth 3 (three) times daily.         . valsartan (DIOVAN) 80 MG tablet   Oral   Take 80 mg by mouth at bedtime.      0   . EXPIRED: clonazePAM (KLONOPIN) 0.5 MG tablet   Oral   Take 1 tablet (0.5 mg total) by mouth at bedtime as needed.   20 tablet   0      Review of Systems: Gen:  Denies  fever, sweats, chills HEENT: Denies blurred vision, double vision, ear pain, eye pain, hearing loss, nose bleeds, sore throat Cvc:  No dizziness, chest pain or heaviness Resp:   Admits ZO:XWRUEA, mild cough, not worsening Gi: Denies swallowing difficulty, stomach pain, nausea or vomiting, diarrhea, constipation, bowel incontinence Gu:  Denies bladder incontinence, burning urine Ext:   No Joint pain, stiffness or swelling Skin: No skin rash, easy bruising or bleeding or hives Endoc:  No polyuria, polydipsia , polyphagia or weight change Other:  All other systems negative  Allergies:  Oxycodone-acetaminophen  Physical Examination:  VS: BP 128/64 mmHg  Pulse 73  Ht  (1.854 m)  Wt 303 lb (137.44 kg)  BMI 39.98 kg/m2  SpO2 100%  General Appearance: No distress  HEENT: PERRLA, no ptosis, no other lesions noticed Pulmonary:normal breath sounds., diaphragmatic excursion normal.No wheezing, No rales   Cardiovascular:  Normal S1,S2.  No m/r/g.      Abdomen:Exam: Benign, Soft, non-tender, No masses  Skin:   warm, no ecchymosis, fine milia appearance lesions on the face and forehead (no erythema, no eruption) Extremities: normal, no cyanosis, clubbing, warm with normal capillary refill.      Rad results: (The following images and results were reviewed by Dr. Dema Severin). CXR 03/19/15 CHEST 2 VIEW  COMPARISON: 03/18/2015  FINDINGS: Progression of bilateral airspace disease left greater than right. Cardiac enlargement. Right pleural effusion is small and unchanged. No significant effusion on the left. No pneumothorax.  IMPRESSION: Progression of bilateral airspace disease left greater than right. Differential includes pulmonary infection and pulmonary edema. Pulmonary hemorrhage also a possibility.    Assessment and Plan:53 yo male seen in follow up for BOOP. Patient doing well on current dose. No exacerbations of SOB, no signs of infection at this time. Recommend slow taper as previously discussed with pateint   BOOP (bronchiolitis obliterans with organizing pneumonia) -patient doing well on 30 mg prednisone for BOOP -patient to continue current dose for next 2 months -no signs of infection at this time  OSA (obstructive sleep apnea) -continue biPAP for OSA -patient seems to be compliant follow up at [facility]   Follow up in 2 months  Updated Medication List  I have personally obtained a history, examined the patient, evaluated Pertinent laboratory and RadioGraphic/imaging results, and  formulated the assessment and plan   The Patient requires high complexity decision making for assessment and support, frequent evaluation and titration of therapies. Time Spent with patient 30 mins   Kurian Santiago Glad, M.D.  [facility]  Medical Director [facility] Medical Director [facility] Cardio-Pulmonary Department

## 2015-07-05 NOTE — Patient Instructions (Signed)

## 2015-07-05 NOTE — Assessment & Plan Note (Signed)
-  patient doing well on 30 mg prednisone for BOOP -patient to continue current dose for next 2 months -no signs of infection at this time

## 2015-07-05 NOTE — Assessment & Plan Note (Signed)
-  continue biPAP for OSA -patient seems to be compliant follow up at [facility]

## 2015-07-06 DIAGNOSIS — Z600 Problems of adjustment to life-cycle transitions: Secondary | ICD-10-CM | POA: Diagnosis not present

## 2015-07-06 DIAGNOSIS — F3341 Major depressive disorder, recurrent, in partial remission: Secondary | ICD-10-CM | POA: Diagnosis not present

## 2015-07-06 DIAGNOSIS — F4323 Adjustment disorder with mixed anxiety and depressed mood: Secondary | ICD-10-CM | POA: Diagnosis not present

## 2015-07-16 DIAGNOSIS — Z992 Dependence on renal dialysis: Secondary | ICD-10-CM | POA: Diagnosis not present

## 2015-07-16 DIAGNOSIS — N2581 Secondary hyperparathyroidism of renal origin: Secondary | ICD-10-CM | POA: Diagnosis not present

## 2015-07-16 DIAGNOSIS — D631 Anemia in chronic kidney disease: Secondary | ICD-10-CM | POA: Diagnosis not present

## 2015-07-16 DIAGNOSIS — D509 Iron deficiency anemia, unspecified: Secondary | ICD-10-CM | POA: Diagnosis not present

## 2015-07-16 DIAGNOSIS — N186 End stage renal disease: Secondary | ICD-10-CM | POA: Diagnosis not present

## 2015-07-26 ENCOUNTER — Ambulatory Visit (INDEPENDENT_AMBULATORY_CARE_PROVIDER_SITE_OTHER): Payer: Medicare Other | Admitting: Endocrinology

## 2015-07-26 ENCOUNTER — Encounter: Payer: Self-pay | Admitting: Endocrinology

## 2015-07-26 VITALS — BP 150/70 | HR 80 | Temp 98.2°F | Resp 16 | Ht 73.0 in | Wt 306.0 lb

## 2015-07-26 DIAGNOSIS — E1165 Type 2 diabetes mellitus with hyperglycemia: Secondary | ICD-10-CM | POA: Diagnosis not present

## 2015-07-26 DIAGNOSIS — E7800 Pure hypercholesterolemia, unspecified: Secondary | ICD-10-CM | POA: Diagnosis not present

## 2015-07-26 DIAGNOSIS — Z794 Long term (current) use of insulin: Secondary | ICD-10-CM | POA: Diagnosis not present

## 2015-07-26 NOTE — Patient Instructions (Signed)
Check blood sugars on waking up .[name]. 4-5 .[name]. times a week and before supper Also check blood sugars about 2 hours after a meal and do this after different meals by rotation  Recommended blood sugar levels on waking up is 90-130 and about 2 hours after meal is 140-180 Please bring blood sugar monitor to each visit.  LANTUS 50 UNITS IN AM AND 6 in pm  MUST TAKE NOVOLOG BEFORE EATING   Blood Glucose (mg/dL)  Breakfast  (Units Novolog Insulin)  Lunch  (Units Novolog Insulin)  Supper  (Units Novolog Insulin)      <70 Treat the low blood sugar. Recheck blood glucose in 15 mins. If sugar is more than 70, then take the number of units of insulin in the 70-90 row, if before a meal.   70-90  16 16 18   0  91-130 (Base Dose)  22 26 32  0   131-150  22 26 34  0   151-200  24 30 38    201-250 26 30 40    251-300  26 34 42    301-500  30 36 44

## 2015-07-26 NOTE — Progress Notes (Signed)
Patient ID: Ryan FellsMyron W Lento, male   DOB: 10/07/1961, 54 y.o.   MRN: [id number]    Chief complaint : Follow up of Type 1 Diabetes  History of Present Illness:          Date of diagnosis: Age [age]      Prior history:  30mg  pred  Recalls has been on insulin since diagnosis. Has tried other insulin therapies in the past. Was on insulin pump 2 years ago, and then taken off pump during hospitalization and never put back on it.  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 Novolog 22-30 units at breakfast and 26-36 units at lunch and 30-40 supper for sugars over 90  Recent history:  He has been on large doses of prednisone in June for his pulmonary condition and blood sugars have been much higher  Currently is taking 30 mg He was seen by the endocrinologist in 6/16 and his Novolog will increase but his Lantus was not changed.  Current blood sugar patterns, problems and treatment regimen:  He was told to increase his Lantus on the last visit as blood sugars were higher in the evening  His fasting blood sugars are usually fairly good, he thinks the couple of high readings are related to rebound from eating a snack for hypoglycemia during the night  However he does not check his blood sugars in the afternoon  POSTPRANDIAL readings after supper are markedly increased.  He now says that since he is eating out all the time he will take his insulin and evening after coming back from the restaurant based on his blood sugar after eating  He still takes his Lantus consistently in the mornings on all days  He says that periodically will feel hypoglycemic in the early morning hours, no documented low sugar except a 63 at 1 AM  Glucose monitoring:  done about 2 times a day         Glucometer: One Touch.      Blood Glucose reading analysis from meter download:   Mean values apply above for all  meters except median for One Touch  PRE-MEAL Fasting Lunch Dinner Bedtime Overall  Glucose range:       Mean/median:        POST-MEAL PC Breakfast PC Lunch PC Dinner  Glucose range:     Mean/median:       Mean values apply above for all meters except median for One Touch  PRE-MEAL Fasting Lunch Dinner  PCS  Overall  Glucose range:  90-245   87-356   173-328   185-601    Mean/median:  195     379   245    Symptoms of hypoglycemia: Weakness and shakiness Self-care:   Meal times:  breakfast 5 am on dialysis days otherwise his first meal is at 12 noon, dinner 6 pm  Exercise:.  unable to do any         Most recent dietitian/nurse educator visit : Years ago           Diabetes labs:  Lab Results  Component Value Date   HGBA1C 8.7 06/14/2015   HGBA1C 7.0* 03/15/2015   HGBA1C 7.8* 01/08/2015   Lab Results  Component Value Date   LDLCALC 65 06/14/2015   CREATININE 5.74* 03/21/2015        Medication List       This list  is accurate as of: 07/26/15  4:49 PM.  Always use your most recent med list.               allopurinol 100 MG tablet  Commonly known as:  ZYLOPRIM  Take 1 tablet (100 mg total) by mouth daily.     amLODipine 10 MG tablet  Commonly known as:  NORVASC  Take 1 tablet (10 mg total) by mouth daily.     aspirin 81 MG chewable tablet  Chew 1 tablet (81 mg total) by mouth daily.     atorvastatin 40 MG tablet  Commonly known as:  LIPITOR  Take 1 tablet (40 mg total) by mouth daily.     B-D ULTRAFINE III SHORT PEN 31G X 8 MM Misc  Generic drug:  Insulin Pen Needle  use as directed     buPROPion 75 MG tablet  Commonly known as:  WELLBUTRIN  Take 1 tablet (75 mg total) by mouth 2 (two) times daily.     clonazePAM 0.5 MG tablet  Commonly known as:  KLONOPIN  Take 1 tablet (0.5 mg total) by mouth at bedtime as needed.     DULoxetine 60 MG capsule  Commonly known as:  CYMBALTA  Take 120 mg by mouth daily.     furosemide 80 MG tablet  Commonly  known as:  LASIX  Take 1 tablet (80 mg total) by mouth 4 (four) times a week. Tuesday, Thursday, Saturday, and Sunday.     gabapentin 100 MG capsule  Commonly known as:  NEURONTIN  Take 100 mg by mouth daily.     hydrALAZINE 25 MG tablet  Commonly known as:  APRESOLINE  Take 1 tablet (25 mg total) by mouth 3 (three) times daily.     insulin aspart 100 UNIT/ML FlexPen  Commonly known as:  NOVOLOG  Inject 22 Units into the skin 3 (three) times daily with meals.     LANTUS SOLOSTAR 100 UNIT/ML Solostar Pen  Generic drug:  Insulin Glargine  Inject 36 units at breakfast and 8 units at bedtime     metoprolol 50 MG tablet  Commonly known as:  LOPRESSOR  Take 50 mg by mouth 2 (two) times daily.     multivitamin Tabs tablet  Take 1 tablet by mouth daily.     pantoprazole 40 MG tablet  Commonly known as:  PROTONIX  take 1 tablet by mouth once daily     predniSONE 20 MG tablet  Commonly known as:  DELTASONE  Take 1.5 tablets (30 mg total) by mouth daily with breakfast. Takes 1 1/2 tablets daily     SENSIPAR 30 MG tablet  Generic drug:  cinacalcet  Take 30 mg by mouth daily.     sevelamer carbonate 800 MG tablet  Commonly known as:  RENVELA  Take 800 mg by mouth 3 (three) times daily.     valsartan 80 MG tablet  Commonly known as:  DIOVAN  Take 80 mg by mouth at bedtime.        Allergies:  Allergies  Allergen Reactions  . Oxycodone-Acetaminophen Nausea And Vomiting    Past Medical History  Diagnosis Date  . Essential hypertension   . Hyperlipemia   . End stage renal disease (HCC)     a. on dialysis; still makes urine.  [name] BOOP (bronchiolitis obliterans with organizing pneumonia) (HCC)     a. 01/2018 s/p R thoracoscopy/Bx confirming BOOP;  b. 02/2018 high dose steroids started.  . Chronic diastolic CHF (congestive heart  failure) (HCC)     a. 12/2014 Echo: EF 50-55%, mild conc LVH, mildly dil LA.  . Morbid obesity (HCC)   . Secondary hyperparathyroidism (HCC)   .  Anemia of chronic disease   . Recurrent pneumonia     a. 2/2 BOOP.  [name] Chest pain     a. 06/2014 Myoview ([facility]) no ischemia/infarct, nl EF.  . Type 1 diabetes mellitus (HCC)   . Thyroid disease   . OSA treated with BiPAP     Past Surgical History  Procedure Laterality Date  . Cataract Bilateral   . Shoulder surgery Left   . Knee surgery Right   . Gallbladder surgery    . Lung biopsy      Family History  Problem Relation Age of Onset  . Diabetes Mother     died @ 65.  [name] Hypertension Mother   . Hypertension Maternal Grandmother     Social History:  reports that he has never smoked. He has never used smokeless tobacco. He reports that he does not drink alcohol or use illicit drugs.    Review of Systems:   CKD: He is on dialysis, goes on Mondays, Wednesdays and Fridays  He is on steroids for bronchiolitis obliterans and will need to be continued on prednisone He takes prednisone 30 mg in the morning now, previously on 50 mg  He was treated by [facility] for leg ulcers  Lipids: Appear poorly controlled as of 6/16 but previously lipids were good.  Unable to identify LDL value on previous lab.  He thinks he is compliant with his Lipitor  Lab Results  Component Value Date   CHOL 194 06/14/2015   HDL 99.30 06/14/2015   LDLCALC 65 06/14/2015   TRIG 151.0* 06/14/2015   CHOLHDL 2 06/14/2015    Diabetes complications: Nephropathy   Physical Examination:  BP 150/70 mmHg  Pulse 80  Temp(Src) 98.2 F (36.8 C)  Resp 16  Ht  (1.854 m)  Wt 306 lb (138.801 kg)  BMI 40.38 kg/m2  SpO2 98%         ASSESSMENT: See history of present illness for detailed discussion of his current management, blood sugar patterns and problems identified  Diabetes type 1 with consistently poor control with mostly postprandial hyperglycemia  Problems identified:  He is still having higher readings after his evening meal for various reasons as discussed above  He is not  understanding the need to take Novolog BEFORE eating and with his postprandial dosing he is tending to have significantly high  readings after eating and some tendency to overnight hypoglycemia also  His fasting blood sugars are overall better safety with increasing his Lantus in the morning last time  Has been difficult to assess how much he needs to cover his meals because he does not check his readings after breakfast and evening readings are done without any mealtime insulin  His diet is probably inadequate because of eating out daily  He is very limited in his activity level and has difficulty losing weight  PLAN:   He will increase the dose of Lantus in the morning  Also increase mealtime coverage at suppertime and escalate the doses for higher blood sugars  Make sure he takes Novolog before eating in the evening, he can take this right before he is eating when he goes out  Better meal planning and will plan to have him see the dietitian also  Patient Instructions   Check blood sugars on waking up .[name]  4-5 .. times a week and before supper Also check blood sugars about 2 hours after a meal and do this after different meals by rotation  Recommended blood sugar levels on waking up is 90-130 and about 2 hours after meal is 140-180 Please bring blood sugar monitor to each visit.  LANTUS 50 UNITS IN AM AND 6 in pm  MUST TAKE NOVOLOG BEFORE EATING   Blood Glucose (mg/dL)  Breakfast  (Units Novolog Insulin)  Lunch  (Units Novolog Insulin)  Supper  (Units Novolog Insulin)      <70 Treat the low blood sugar. Recheck blood glucose in 15 mins. If sugar is more than 70, then take the number of units of insulin in the 70-90 row, if before a meal.   70-90  16 16 18   0  91-130 (Base Dose)  22 26 32  0   131-150  22 26 34  0   151-200  24 30 38    201-250 26 30 40    251-300  26 34 42    301-500  30 36 44              Counseling  time on subjects discussed above is over 50% of today's 25 minute visit  Oasis Goehring 07/26/2015, 4:49 PM

## 2015-07-29 ENCOUNTER — Other Ambulatory Visit: Payer: Self-pay | Admitting: Family Medicine

## 2015-08-02 DIAGNOSIS — E669 Obesity, unspecified: Secondary | ICD-10-CM | POA: Diagnosis not present

## 2015-08-02 DIAGNOSIS — E785 Hyperlipidemia, unspecified: Secondary | ICD-10-CM | POA: Diagnosis not present

## 2015-08-02 DIAGNOSIS — Y841 Kidney dialysis as the cause of abnormal reaction of the patient, or of later complication, without mention of misadventure at the time of the procedure: Secondary | ICD-10-CM | POA: Diagnosis not present

## 2015-08-02 DIAGNOSIS — T859XXA Unspecified complication of internal prosthetic device, implant and graft, initial encounter: Secondary | ICD-10-CM | POA: Diagnosis not present

## 2015-08-02 DIAGNOSIS — N186 End stage renal disease: Secondary | ICD-10-CM | POA: Diagnosis not present

## 2015-08-02 DIAGNOSIS — I10 Essential (primary) hypertension: Secondary | ICD-10-CM | POA: Diagnosis not present

## 2015-08-02 DIAGNOSIS — J449 Chronic obstructive pulmonary disease, unspecified: Secondary | ICD-10-CM | POA: Diagnosis not present

## 2015-08-02 DIAGNOSIS — J8489 Other specified interstitial pulmonary diseases: Secondary | ICD-10-CM | POA: Diagnosis not present

## 2015-08-03 DIAGNOSIS — Z600 Problems of adjustment to life-cycle transitions: Secondary | ICD-10-CM | POA: Diagnosis not present

## 2015-08-03 DIAGNOSIS — F419 Anxiety disorder, unspecified: Secondary | ICD-10-CM | POA: Diagnosis not present

## 2015-08-03 DIAGNOSIS — F3342 Major depressive disorder, recurrent, in full remission: Secondary | ICD-10-CM | POA: Diagnosis not present

## 2015-08-10 ENCOUNTER — Ambulatory Visit: Payer: Medicare Other | Attending: Family | Admitting: Family

## 2015-08-10 ENCOUNTER — Encounter: Payer: Self-pay | Admitting: Family

## 2015-08-10 VITALS — BP 125/68 | HR 90 | Resp 20 | Ht 73.0 in | Wt 304.0 lb

## 2015-08-10 DIAGNOSIS — I5032 Chronic diastolic (congestive) heart failure: Secondary | ICD-10-CM | POA: Diagnosis not present

## 2015-08-10 DIAGNOSIS — Z6841 Body Mass Index (BMI) 40.0 and over, adult: Secondary | ICD-10-CM | POA: Diagnosis not present

## 2015-08-10 DIAGNOSIS — I120 Hypertensive chronic kidney disease with stage 5 chronic kidney disease or end stage renal disease: Secondary | ICD-10-CM | POA: Diagnosis not present

## 2015-08-10 DIAGNOSIS — N2581 Secondary hyperparathyroidism of renal origin: Secondary | ICD-10-CM | POA: Diagnosis not present

## 2015-08-10 DIAGNOSIS — N186 End stage renal disease: Secondary | ICD-10-CM | POA: Diagnosis not present

## 2015-08-10 DIAGNOSIS — Z79899 Other long term (current) drug therapy: Secondary | ICD-10-CM | POA: Diagnosis not present

## 2015-08-10 DIAGNOSIS — J8489 Other specified interstitial pulmonary diseases: Secondary | ICD-10-CM

## 2015-08-10 DIAGNOSIS — Z7982 Long term (current) use of aspirin: Secondary | ICD-10-CM | POA: Diagnosis not present

## 2015-08-10 DIAGNOSIS — I10 Essential (primary) hypertension: Secondary | ICD-10-CM

## 2015-08-10 DIAGNOSIS — E785 Hyperlipidemia, unspecified: Secondary | ICD-10-CM | POA: Insufficient documentation

## 2015-08-10 DIAGNOSIS — E109 Type 1 diabetes mellitus without complications: Secondary | ICD-10-CM | POA: Diagnosis not present

## 2015-08-10 DIAGNOSIS — Z794 Long term (current) use of insulin: Secondary | ICD-10-CM | POA: Diagnosis not present

## 2015-08-10 DIAGNOSIS — G4733 Obstructive sleep apnea (adult) (pediatric): Secondary | ICD-10-CM | POA: Insufficient documentation

## 2015-08-10 NOTE — Progress Notes (Signed)
Subjective:    Patient ID: Ryan Arnold, male    DOB: [birth year], 54 y.o.   MRN: [id number]  Congestive Heart Failure Presents for follow-up visit. The disease course has been stable. Associated symptoms include edema, fatigue and shortness of breath. Pertinent negatives include no abdominal pain, chest pain, chest pressure, orthopnea, palpitations or paroxysmal nocturnal dyspnea. The symptoms have been stable. Past treatments include aldosterone receptor blockers, oxygen, salt and fluid restriction and beta blockers. The treatment provided moderate relief. Compliance with prior treatments has been good. His past medical history is significant for anemia, chronic lung disease, DM and HTN. Compliance with total regimen is 76-100%.  Other This is a chronic (edema) problem. The current episode started more than 1 year ago. The problem occurs daily. The problem has been unchanged. Associated symptoms include arthralgias (both knees) and fatigue. Pertinent negatives include no abdominal pain, chest pain, congestion, coughing, headaches, neck pain or sore throat. The symptoms are aggravated by walking and standing. He has tried position changes for the symptoms. The treatment provided mild relief.    Past Medical History  Diagnosis Date  . Essential hypertension   . Hyperlipemia   . End stage renal disease (HCC)     a. on dialysis; still makes urine.  [name] BOOP (bronchiolitis obliterans with organizing pneumonia) (HCC)     a. 01/2018 s/p R thoracoscopy/Bx confirming BOOP;  b. 02/2018 high dose steroids started.  . Chronic diastolic CHF (congestive heart failure) (HCC)     a. 12/2014 Echo: EF 50-55%, mild conc LVH, mildly dil LA.  . Morbid obesity (HCC)   . Secondary hyperparathyroidism (HCC)   . Anemia of chronic disease   . Recurrent pneumonia     a. 2/2 BOOP.  [name] Chest pain     a. 06/2014 Myoview ([facility]) no ischemia/infarct, nl EF.  . Type 1 diabetes mellitus (HCC)   . Thyroid disease   . OSA  treated with BiPAP     Past Surgical History  Procedure Laterality Date  . Cataract Bilateral   . Shoulder surgery Left   . Knee surgery Right   . Gallbladder surgery    . Lung biopsy      , Family History  Problem Relation Age of Onset  . Diabetes Mother     died @ 11.  [name] Hypertension Mother   . Hypertension Maternal Grandmother     Social History  Substance Use Topics  . Smoking status: Never Smoker   . Smokeless tobacco: Never Used  . Alcohol Use: No    Allergies  Allergen Reactions  . Oxycodone-Acetaminophen Nausea And Vomiting    Prior to Admission medications   Medication Sig Start Date End Date Taking? Authorizing Provider  allopurinol (ZYLOPRIM) 100 MG tablet Take 1 tablet (100 mg total) by mouth daily. 04/11/15  Yes Delma Freeze, FNP  amLODipine (NORVASC) 10 MG tablet take 1 tablet by mouth once daily 07/30/15  Yes Tommie Sams, DO  aspirin 81 MG chewable tablet Chew 1 tablet (81 mg total) by mouth daily. 03/21/15  Yes Enid Baas, MD  atorvastatin (LIPITOR) 40 MG tablet Take 1 tablet (40 mg total) by mouth daily. Patient taking differently: Take 20 mg by mouth daily.  06/05/15  Yes Tommie Sams, DO  B-D ULTRAFINE III SHORT PEN 31G X 8 MM MISC use as directed 06/07/15  Yes Jayce G Cook, DO  DULoxetine (CYMBALTA) 60 MG capsule Take 120 mg by mouth daily. 01/02/15  Yes Historical Provider,  MD  furosemide (LASIX) 80 MG tablet Take 1 tablet (80 mg total) by mouth 4 (four) times a week. Tuesday, Thursday, Saturday, and Sunday. 04/11/15  Yes Delma Freezeina A Hackney, FNP  gabapentin (NEURONTIN) 100 MG capsule Take 100 mg by mouth daily. 12/26/14  Yes Historical Provider, MD  hydrALAZINE (APRESOLINE) 25 MG tablet Take 1 tablet (25 mg total) by mouth 3 (three) times daily. 04/13/15  Yes Iran OuchMuhammad A Arida, MD  insulin aspart (NOVOLOG) 100 UNIT/ML FlexPen Inject 22 Units into the skin 3 (three) times daily with meals. 06/05/15  Yes Reather LittlerAjay Kumar, MD  LANTUS SOLOSTAR 100 UNIT/ML Solostar  Pen Inject 36 units at breakfast and 8 units at bedtime Patient taking differently: Inject 41 units at breakfast and 8 units at bedtime 05/17/15  Yes Reather LittlerAjay Kumar, MD  metoprolol (LOPRESSOR) 50 MG tablet Take 50 mg by mouth 2 (two) times daily. 12/23/14  Yes Historical Provider, MD  multivitamin (RENA-VIT) TABS tablet Take 1 tablet by mouth daily. 01/09/14  Yes Historical Provider, MD  pantoprazole (PROTONIX) 40 MG tablet take 1 tablet by mouth once daily 06/21/15  Yes Jayce G Cook, DO  predniSONE (DELTASONE) 20 MG tablet Take 1.5 tablets (30 mg total) by mouth daily with breakfast. Takes 1 1/2 tablets daily 07/05/15  Yes Erin FullingKurian Kasa, MD  SENSIPAR 30 MG tablet Take 30 mg by mouth daily. 12/18/14  Yes Historical Provider, MD  sevelamer carbonate (RENVELA) 800 MG tablet Take 800 mg by mouth 3 (three) times daily. 03/29/14  Yes Historical Provider, MD  valsartan (DIOVAN) 80 MG tablet Take 80 mg by mouth at bedtime. 05/29/15  Yes Historical Provider, MD  clonazePAM (KLONOPIN) 0.5 MG tablet Take 1 tablet (0.5 mg total) by mouth at bedtime as needed. 03/21/15 06/04/15  Enid Baasadhika Kalisetti, MD     Review of Systems  Constitutional: Positive for fatigue. Negative for appetite change.  HENT: Negative for congestion, postnasal drip and sore throat.   Eyes: Negative.   Respiratory: Positive for shortness of breath. Negative for cough and wheezing.   Cardiovascular: Positive for leg swelling. Negative for chest pain and palpitations.  Gastrointestinal: Negative for abdominal pain and abdominal distention.  Endocrine: Negative.   Genitourinary: Negative.   Musculoskeletal: Positive for arthralgias (both knees). Negative for back pain and neck pain.  Skin: Negative.   Allergic/Immunologic: Negative.   Neurological: Negative for dizziness, light-headedness and headaches.  Hematological: Negative for adenopathy. Bruises/bleeds easily.  Psychiatric/Behavioral: Negative for sleep disturbance (sleeping on 2 pillows with bipap  and oxygen at 3L) and dysphoric mood. The patient is not nervous/anxious.        Objective:   Physical Exam  Constitutional: He is oriented to person, place, and time. He appears well-developed and well-nourished.  HENT:  Head: Normocephalic and atraumatic.  Eyes: Conjunctivae are normal. Pupils are equal, round, and reactive to light.  Neck: Normal range of motion. Neck supple.  Cardiovascular: Regular rhythm.  Tachycardia present.   Pulmonary/Chest: Effort normal. He has no wheezes. He has no rales.  Abdominal: Soft. He exhibits distension. There is no tenderness.  Musculoskeletal: He exhibits edema (2+ pitting edema in bilateral lower legs). He exhibits no tenderness.  Neurological: He is alert and oriented to person, place, and time.  Skin: Skin is warm and dry.  Psychiatric: He has a normal mood and affect. His behavior is normal. Thought content normal.  Nursing note and vitals reviewed.   BP 125/68 mmHg  Pulse 90  Resp 20  Ht 6\' 1"  (1.854 m)  Wt 304 lb (137.893 kg)  BMI 40.12 kg/m2  SpO2 100%       Assessment & Plan:  1: Chronic heart failure with preserved ejection fraction- Patient presents with shortness of breath and fatigue upon exertion. He says that he was a little short of breath upon walking into the office today but once he sat down for a few minutes, his breathing improved. He does wear his oxygen at 3L at bedtime as well as upon exertion. He continues to weigh himself daily and upon review of his weight chart, it shows quite the fluctuation due to whether it's a dialysis day or not. Reminded to call for an overnight weight gain of >2 pounds or a weekly weight gain of >5 pounds. He is not adding any salt to his food and does try to follow a low sodium diet. Has already received his flu vaccine. Does have chronic edema in both of his lower legs and he does try to elevate them when he's at home. 2: HTN- Blood pressure looks good today. Continue medications at this  time. 3: BOOP- Follows closely with pulmonology in this regard. Wears his oxygen per above. Has had his prednisone decreased to  daily. 4: Obstructive sleep apnea- Patient does wear his bipap nightly along with his oxygen. Reports sleeping well.  Patient has an extensive bruise along the left side of his abdomen. He says that his glucose dropped one night last week and he fell out of the bed and was stuck between the bed and a dresser. Denies any pain and it appears that the bruise is fading. Denies anymore low blood sugars since that night.   Return here in 4 months or sooner for any questions/problems before then.

## 2015-08-10 NOTE — Patient Instructions (Signed)
Continue weighing daily and call for an overnight weight gain of > 2 pounds or a weekly weight gain of >5 pounds. 

## 2015-08-13 DIAGNOSIS — Z992 Dependence on renal dialysis: Secondary | ICD-10-CM | POA: Diagnosis not present

## 2015-08-13 DIAGNOSIS — N186 End stage renal disease: Secondary | ICD-10-CM | POA: Diagnosis not present

## 2015-08-14 ENCOUNTER — Emergency Department: Payer: Medicare Other

## 2015-08-14 ENCOUNTER — Emergency Department
Admission: EM | Admit: 2015-08-14 | Discharge: 2015-08-14 | Disposition: A | Discharge status: Home / Self Care | Payer: Medicare Other | Attending: Emergency Medicine | Admitting: Emergency Medicine

## 2015-08-14 DIAGNOSIS — Z7982 Long term (current) use of aspirin: Secondary | ICD-10-CM | POA: Insufficient documentation

## 2015-08-14 DIAGNOSIS — R079 Chest pain, unspecified: Secondary | ICD-10-CM | POA: Diagnosis not present

## 2015-08-14 DIAGNOSIS — R197 Diarrhea, unspecified: Secondary | ICD-10-CM | POA: Insufficient documentation

## 2015-08-14 DIAGNOSIS — Z79899 Other long term (current) drug therapy: Secondary | ICD-10-CM | POA: Diagnosis not present

## 2015-08-14 DIAGNOSIS — R112 Nausea with vomiting, unspecified: Secondary | ICD-10-CM | POA: Insufficient documentation

## 2015-08-14 DIAGNOSIS — R0602 Shortness of breath: Secondary | ICD-10-CM | POA: Diagnosis not present

## 2015-08-14 DIAGNOSIS — I120 Hypertensive chronic kidney disease with stage 5 chronic kidney disease or end stage renal disease: Secondary | ICD-10-CM | POA: Diagnosis not present

## 2015-08-14 DIAGNOSIS — E109 Type 1 diabetes mellitus without complications: Secondary | ICD-10-CM | POA: Diagnosis not present

## 2015-08-14 DIAGNOSIS — R0789 Other chest pain: Secondary | ICD-10-CM | POA: Diagnosis not present

## 2015-08-14 DIAGNOSIS — N186 End stage renal disease: Secondary | ICD-10-CM | POA: Insufficient documentation

## 2015-08-14 DIAGNOSIS — Z992 Dependence on renal dialysis: Secondary | ICD-10-CM | POA: Diagnosis not present

## 2015-08-14 DIAGNOSIS — Z794 Long term (current) use of insulin: Secondary | ICD-10-CM | POA: Diagnosis not present

## 2015-08-14 DIAGNOSIS — Z7952 Long term (current) use of systemic steroids: Secondary | ICD-10-CM | POA: Insufficient documentation

## 2015-08-14 DIAGNOSIS — I5032 Chronic diastolic (congestive) heart failure: Secondary | ICD-10-CM | POA: Diagnosis not present

## 2015-08-14 LAB — COMPREHENSIVE METABOLIC PANEL
ALT: 55 U/L (ref 17–63)
ANION GAP: 13 (ref 5–15)
AST: 42 U/L — ABNORMAL HIGH (ref 15–41)
Albumin: 3.5 g/dL (ref 3.5–5.0)
Alkaline Phosphatase: 113 U/L (ref 38–126)
BILIRUBIN TOTAL: 0.7 mg/dL (ref 0.3–1.2)
BUN: 56 mg/dL — AB (ref 6–20)
CHLORIDE: 95 mmol/L — AB (ref 101–111)
CO2: 33 mmol/L — ABNORMAL HIGH (ref 22–32)
Calcium: 7.6 mg/dL — ABNORMAL LOW (ref 8.9–10.3)
Creatinine, Ser: 8.05 mg/dL — ABNORMAL HIGH (ref 0.61–1.24)
GFR, EST AFRICAN AMERICAN: 8 mL/min — AB (ref >60)
GFR, EST NON AFRICAN AMERICAN: 7 mL/min — AB (ref >60)
Glucose, Bld: 261 mg/dL — ABNORMAL HIGH (ref 65–99)
Potassium: 4.9 mmol/L (ref 3.5–5.1)
Sodium: 141 mmol/L (ref 135–145)
TOTAL PROTEIN: 6.7 g/dL (ref 6.5–8.1)

## 2015-08-14 LAB — CBC
HCT: 38.7 % — ABNORMAL LOW (ref 40.0–52.0)
Hemoglobin: 12.3 g/dL — ABNORMAL LOW (ref 13.0–18.0)
MCH: 31.2 pg (ref 26.0–34.0)
MCHC: 31.8 g/dL — AB (ref 32.0–36.0)
MCV: 98.2 fL (ref 80.0–100.0)
PLATELETS: 225 10*3/uL (ref 150–440)
RBC: 3.94 MIL/uL — AB (ref 4.40–5.90)
RDW: 18 % — ABNORMAL HIGH (ref 11.5–14.5)
WBC: 11 10*3/uL — ABNORMAL HIGH (ref 3.8–10.6)

## 2015-08-14 LAB — TROPONIN I
TROPONIN I: 0.06 ng/mL — AB (ref <0.031)
TROPONIN I: 0.06 ng/mL — AB (ref <0.031)

## 2015-08-14 LAB — LIPASE, BLOOD: LIPASE: 45 U/L (ref 11–51)

## 2015-08-14 MED ORDER — ACETAMINOPHEN 500 MG PO TABS
1000.0000 mg | ORAL_TABLET | Freq: Once | ORAL | Status: AC
  Administered 2015-08-14: 1000 mg via ORAL

## 2015-08-14 MED ORDER — ONDANSETRON HCL 4 MG/2ML IJ SOLN
4.0000 mg | Freq: Once | INTRAMUSCULAR | Status: AC
  Administered 2015-08-14: 4 mg via INTRAVENOUS
  Filled 2015-08-14: qty 2

## 2015-08-14 MED ORDER — ACETAMINOPHEN 500 MG PO TABS
ORAL_TABLET | ORAL | Status: AC
  Administered 2015-08-14: 1000 mg via ORAL
  Filled 2015-08-14: qty 2

## 2015-08-14 NOTE — ED Notes (Signed)
Pt to room 7 via w/c with no distress noted; dialysis yesterday but was sched to return today; awoke with N/V and left sided CP, nonradiating

## 2015-08-14 NOTE — ED Provider Notes (Signed)
[facility] Emergency Department Provider Note  Time seen: 7:24 AM  I have reviewed the triage vital signs and the nursing notes.   HISTORY  Chief Complaint Chest Pain    HPI ANCHOR DWAN is a 54 y.o. male with a past medical history of hypertension, hyperlipidemia, end-stage renal disease on hemodialysis Monday/Wednesday/Friday, BOOP, CHF, obesity, presents the emergency department with left chest pain. According to the patient he was awoken around 2:00 in the morning with nausea, vomiting, diarrhea, and left chest pain. He describes the chest pain as a tightness sensation in the left chest. Describes it currently as mild but moderate at maximum intensity earlier this morning. States a history of BOOP, which has caused chest pain in the past. States he had dialysis yesterday, but they told him he was fluid overloaded and he needed to return today for additional dialysis. Patient currently states his left chest tightness as mild, denies nausea, vomiting, diarrhea currently.    Past Medical History  Diagnosis Date  . Essential hypertension   . Hyperlipemia   . End stage renal disease (HCC)     a. on dialysis; still makes urine.  [name] BOOP (bronchiolitis obliterans with organizing pneumonia) (HCC)     a. 01/2018 s/p R thoracoscopy/Bx confirming BOOP;  b. 02/2018 high dose steroids started.  . Chronic diastolic CHF (congestive heart failure) (HCC)     a. 12/2014 Echo: EF 50-55%, mild conc LVH, mildly dil LA.  . Morbid obesity (HCC)   . Secondary hyperparathyroidism (HCC)   . Anemia of chronic disease   . Recurrent pneumonia     a. 2/2 BOOP.  [name] Chest pain     a. 06/2014 Myoview ([facility]) no ischemia/infarct, nl EF.  . Type 1 diabetes mellitus (HCC)   . Thyroid disease   . OSA treated with BiPAP     Patient Active Problem List   Diagnosis Date Noted  . Pure hypercholesterolemia 06/14/2015  . Impotence 06/05/2015  . Trouble swallowing 06/05/2015  . Preventative  health care 06/05/2015  . PVC's (premature ventricular contractions) 04/23/2015  . Anemia of chronic disease   . Secondary hyperparathyroidism (HCC)   . Morbid obesity (HCC)   . Chronic diastolic CHF (congestive heart failure) (HCC)   . Essential hypertension   . 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 Proliferative retinopathy 03/15/2015  . CKD (chronic kidney disease) stage V requiring chronic dialysis (HCC) 03/15/2015  . Hyperlipidemia 03/15/2015  . BOOP (bronchiolitis obliterans with organizing pneumonia) (HCC) 03/01/2015  . OSA (obstructive sleep apnea) 01/18/2015  . Recurrent pneumonia 01/15/2015    Past Surgical History  Procedure Laterality Date  . Cataract Bilateral   . Shoulder surgery Left   . Knee surgery Right   . Gallbladder surgery    . Lung biopsy      Current Outpatient Rx  Name  Route  Sig  Dispense  Refill  . allopurinol (ZYLOPRIM) 100 MG tablet   Oral   Take 1 tablet (100 mg total) by mouth daily.   90 tablet   1   . amLODipine (NORVASC) 10 MG tablet      take 1 tablet by mouth once daily   30 tablet   1   . aspirin 81 MG chewable tablet   Oral   Chew 1 tablet (81 mg total) by mouth daily.   30 tablet   2   . atorvastatin (LIPITOR) 40 MG tablet   Oral   Take 1  tablet (40 mg total) by mouth daily. Patient taking differently: Take 20 mg by mouth daily.    90 tablet   3   . B-D ULTRAFINE III SHORT PEN 31G X 8 MM MISC      use as directed   100 each   11     Phone number to our clinic (as requested) is [phone] ...   . EXPIRED: clonazePAM (KLONOPIN) 0.5 MG tablet   Oral   Take 1 tablet (0.5 mg total) by mouth at bedtime as needed.   20 tablet   0   . DULoxetine (CYMBALTA) 60 MG capsule   Oral   Take 120 mg by mouth daily.      0   . furosemide (LASIX) 80 MG tablet   Oral   Take 1 tablet (80 mg total) by mouth 4 (four) times a week. Tuesday, Thursday, Saturday, and Sunday.   36 tablet   3   .  gabapentin (NEURONTIN) 100 MG capsule   Oral   Take 100 mg by mouth daily.      1   . hydrALAZINE (APRESOLINE) 25 MG tablet   Oral   Take 1 tablet (25 mg total) by mouth 3 (three) times daily.   90 tablet   1   . insulin aspart (NOVOLOG) 100 UNIT/ML FlexPen   Subcutaneous   Inject 22 Units into the skin 3 (three) times daily with meals.   15 mL   2   . LANTUS SOLOSTAR 100 UNIT/ML Solostar Pen      Inject 36 units at breakfast and 8 units at bedtime Patient taking differently: Inject 41 units at breakfast and 8 units at bedtime   15 mL   1     Dispense as written.    Dose Change   . metoprolol (LOPRESSOR) 50 MG tablet   Oral   Take 50 mg by mouth 2 (two) times daily.         . multivitamin (RENA-VIT) TABS tablet   Oral   Take 1 tablet by mouth daily.         . pantoprazole (PROTONIX) 40 MG tablet      take 1 tablet by mouth once daily   30 tablet   0   . predniSONE (DELTASONE) 20 MG tablet   Oral   Take 1.5 tablets (30 mg total) by mouth daily with breakfast. Takes 1 1/2 tablets daily   90 tablet   0   . SENSIPAR 30 MG tablet   Oral   Take 30 mg by mouth daily.           Dispense as written.   . sevelamer carbonate (RENVELA) 800 MG tablet   Oral   Take 800 mg by mouth 3 (three) times daily.         . valsartan (DIOVAN) 80 MG tablet   Oral   Take 80 mg by mouth at bedtime.      0     Allergies Oxycodone-acetaminophen  Family History  Problem Relation Age of Onset  . Diabetes Mother     died @ 3.  [name] Hypertension Mother   . Hypertension Maternal Grandmother     Social History Social History  Substance Use Topics  . Smoking status: Never Smoker   . Smokeless tobacco: Never Used  . Alcohol Use: No    Review of Systems Constitutional: Negative for fever. Cardiovascular: Positive for left chest tightness Respiratory: Negative for shortness of breath. Gastrointestinal: Negative  for abdominal pain. Positive for nausea, vomiting,  diarrhea now resolved. Genitourinary: Negative for dysuria. Neurological: Negative for headache 10-point ROS otherwise negative.  ____________________________________________   PHYSICAL EXAM:  VITAL SIGNS: ED Triage Vitals  Enc Vitals Group     BP 08/14/15 0656 151/76 mmHg     Pulse Rate 08/14/15 0656 97     Resp 08/14/15 0656 30     Temp 08/14/15 0656 97.8 F (36.6 C)     Temp Source 08/14/15 0656 Oral     SpO2 08/14/15 0656 100 %     Weight 08/14/15 0656 302 lb (136.986 kg)     Height 08/14/15 0656 6\' 1"  (1.854 m)     Head Cir --      Peak Flow --      Pain Score 08/14/15 0655 5     Pain Loc --      Pain Edu? --      Excl. in GC? --    Constitutional: Alert and oriented. Well appearing and in no distress. Eyes: Normal exam ENT   Head: Normocephalic and atraumatic.   Mouth/Throat: Mucous membranes are moist. Cardiovascular: Normal rate, regular rhythm. No murmur Respiratory: Normal respiratory effort without tachypnea nor retractions. Breath sounds are clear. Nontender to palpation. Gastrointestinal: Nontender to palpation. Obese. Musculoskeletal: Nontender with normal range of motion in all extremities. Left upper extremity AV fistula. Neurologic:  Normal speech and language. No gross focal neurologic deficits Psychiatric: Mood and affect are normal. Speech and behavior are normal. ____________________________________________    EKG  EKG reviewed and interpreted by myself shows normal sinus rhythm at 98 bpm, narrow QRS, left axis deviation, normal intervals, nonspecific ST changes present. No ST elevations noted.  ____________________________________________    RADIOLOGY  Chest x-ray shows no active disease.  ____________________________________________    INITIAL IMPRESSION / ASSESSMENT AND PLAN / ED COURSE  Pertinent labs & imaging results that were available during my care of the patient were reviewed by me and considered in my medical decision  making (see chart for details).  Patient with chest pain 5 hours. History of BOOP causing chest pain in the past. We will check labs, chest x-ray, and closely monitor in the emergency department. EKG does not show any concerning findings.  X-ray and labs are largely at baseline. Troponin slightly elevated 0.06, repeat troponin is 0.06. Patient states his pain is gone, nausea is gone. We will discharge home with primary care follow-up tomorrow. Patient is agreeable to plan. I discussed very strict return precautions for any more chest pain, trouble breathing, patient agreeable to plan.  ____________________________________________   FINAL CLINICAL IMPRESSION(S) / ED DIAGNOSES  Left chest pain   Minna AntisKevin Aquilla Voiles, MD 08/14/15 1205

## 2015-08-14 NOTE — ED Notes (Signed)
Patient verbalized being a known hemodialysis patient every Monday, Wednesday, and Friday.  Patient reported that he was told yesterday that he was "overloaded" and they told him they needed to remove a 9kg.  The patient verbalized that the HD nurse removed 6kg Monday and patient was to come back Tuesday to have 3 more kg removed.

## 2015-08-14 NOTE — Discharge Instructions (Signed)
You have been seen in the emergency department today for chest pain. Your workup has shown normal results. As we discussed please follow-up with your primary care physician tomorrow for recheck. Return to the emergency department for any further chest pain, trouble breathing, or any other symptom personally concerning to yourself.    Nonspecific Chest Pain It is often hard to find the cause of chest pain. There is always a chance that your pain could be related to something serious, such as a heart attack or a blood clot in your lungs. Chest pain can also be caused by conditions that are not life-threatening. If you have chest pain, it is very important to follow up with your doctor.  HOME CARE  If you were prescribed an antibiotic medicine, finish it all even if you start to feel better.  Avoid any activities that cause chest pain.  Do not use any tobacco products, including cigarettes, chewing tobacco, or electronic cigarettes. If you need help quitting, ask your doctor.  Do not drink alcohol.  Take medicines only as told by your doctor.  Keep all follow-up visits as told by your doctor. This is important. This includes any further testing if your chest pain does not go away.  Your doctor may tell you to keep your head raised (elevated) while you sleep.  Make lifestyle changes as told by your doctor. These may include:  Getting regular exercise. Ask your doctor to suggest some activities that are safe for you.  Eating a heart-healthy diet. Your doctor or a diet specialist (dietitian) can help you to learn healthy eating options.  Maintaining a healthy weight.  Managing diabetes, if necessary.  Reducing stress. GET HELP IF:  Your chest pain does not go away, even after treatment.  You have a rash with blisters on your chest.  You have a fever. GET HELP RIGHT AWAY IF:  Your chest pain is worse.  You have an increasing cough, or you cough up blood.  You have severe belly  (abdominal) pain.  You feel extremely weak.  You pass out (faint).  You have chills.  You have sudden, unexplained chest discomfort.  You have sudden, unexplained discomfort in your arms, back, neck, or jaw.  You have shortness of breath at any time.  You suddenly start to sweat, or your skin gets clammy.  You feel nauseous.  You vomit.  You suddenly feel light-headed or dizzy.  Your heart begins to beat quickly, or it feels like it is skipping beats. These symptoms may be an emergency. Do not wait to see if the symptoms will go away. Get medical help right away. Call your local emergency services (911 in the [place] Do not drive yourself to the hospital.   This information is not intended to replace advice given to you by your health care provider. Make sure you discuss any questions you have with your health care provider.   Document Released: 03/17/2008 Document Revised: 10/20/2014 Document Reviewed: 05/05/2014 Elsevier Interactive Patient Education [employer]2016 Elsevier Inc.

## 2015-08-14 NOTE — ED Notes (Signed)
Patient called complaining of headache.  Verbal order received for tylenol.

## 2015-08-14 NOTE — ED Notes (Signed)
NAD noted at this time. Pt sitting up on side of bed. Per pt he is waiting for discharge.

## 2015-08-14 NOTE — ED Notes (Signed)
NAD noted at this time. Pt taken to the lobby via wheelchair. Denies any comments/concerns at this time.

## 2015-08-16 DIAGNOSIS — E1122 Type 2 diabetes mellitus with diabetic chronic kidney disease: Secondary | ICD-10-CM | POA: Diagnosis not present

## 2015-08-16 DIAGNOSIS — E11649 Type 2 diabetes mellitus with hypoglycemia without coma: Secondary | ICD-10-CM | POA: Diagnosis not present

## 2015-08-16 DIAGNOSIS — N186 End stage renal disease: Secondary | ICD-10-CM | POA: Diagnosis not present

## 2015-08-16 DIAGNOSIS — E8770 Fluid overload, unspecified: Secondary | ICD-10-CM | POA: Diagnosis not present

## 2015-08-16 DIAGNOSIS — Z992 Dependence on renal dialysis: Secondary | ICD-10-CM | POA: Diagnosis not present

## 2015-08-16 DIAGNOSIS — N2581 Secondary hyperparathyroidism of renal origin: Secondary | ICD-10-CM | POA: Diagnosis not present

## 2015-08-16 DIAGNOSIS — D631 Anemia in chronic kidney disease: Secondary | ICD-10-CM | POA: Diagnosis not present

## 2015-08-16 DIAGNOSIS — D509 Iron deficiency anemia, unspecified: Secondary | ICD-10-CM | POA: Diagnosis not present

## 2015-08-17 ENCOUNTER — Emergency Department
Admission: EM | Admit: 2015-08-17 | Discharge: 2015-08-17 | Disposition: A | Discharge status: Home / Self Care | Payer: Medicare Other | Attending: Student | Admitting: Student

## 2015-08-17 ENCOUNTER — Encounter: Payer: Self-pay | Admitting: *Deleted

## 2015-08-17 DIAGNOSIS — R197 Diarrhea, unspecified: Secondary | ICD-10-CM

## 2015-08-17 DIAGNOSIS — Z992 Dependence on renal dialysis: Secondary | ICD-10-CM | POA: Diagnosis not present

## 2015-08-17 DIAGNOSIS — Z79899 Other long term (current) drug therapy: Secondary | ICD-10-CM | POA: Insufficient documentation

## 2015-08-17 DIAGNOSIS — Z794 Long term (current) use of insulin: Secondary | ICD-10-CM | POA: Diagnosis not present

## 2015-08-17 DIAGNOSIS — I120 Hypertensive chronic kidney disease with stage 5 chronic kidney disease or end stage renal disease: Secondary | ICD-10-CM | POA: Insufficient documentation

## 2015-08-17 DIAGNOSIS — N186 End stage renal disease: Secondary | ICD-10-CM | POA: Insufficient documentation

## 2015-08-17 DIAGNOSIS — E109 Type 1 diabetes mellitus without complications: Secondary | ICD-10-CM | POA: Insufficient documentation

## 2015-08-17 DIAGNOSIS — N2581 Secondary hyperparathyroidism of renal origin: Secondary | ICD-10-CM | POA: Diagnosis not present

## 2015-08-17 DIAGNOSIS — D509 Iron deficiency anemia, unspecified: Secondary | ICD-10-CM | POA: Diagnosis not present

## 2015-08-17 DIAGNOSIS — E1122 Type 2 diabetes mellitus with diabetic chronic kidney disease: Secondary | ICD-10-CM | POA: Diagnosis not present

## 2015-08-17 DIAGNOSIS — D631 Anemia in chronic kidney disease: Secondary | ICD-10-CM | POA: Diagnosis not present

## 2015-08-17 DIAGNOSIS — Z7982 Long term (current) use of aspirin: Secondary | ICD-10-CM | POA: Insufficient documentation

## 2015-08-17 DIAGNOSIS — E11649 Type 2 diabetes mellitus with hypoglycemia without coma: Secondary | ICD-10-CM | POA: Diagnosis not present

## 2015-08-17 LAB — CBC
HEMATOCRIT: 37.2 % — AB (ref 40.0–52.0)
HEMOGLOBIN: 12.1 g/dL — AB (ref 13.0–18.0)
MCH: 31.3 pg (ref 26.0–34.0)
MCHC: 32.4 g/dL (ref 32.0–36.0)
MCV: 96.5 fL (ref 80.0–100.0)
Platelets: 228 10*3/uL (ref 150–440)
RBC: 3.86 MIL/uL — ABNORMAL LOW (ref 4.40–5.90)
RDW: 17.1 % — AB (ref 11.5–14.5)
WBC: 11.5 10*3/uL — ABNORMAL HIGH (ref 3.8–10.6)

## 2015-08-17 LAB — COMPREHENSIVE METABOLIC PANEL
ALBUMIN: 3.4 g/dL — AB (ref 3.5–5.0)
ALK PHOS: 107 U/L (ref 38–126)
ALT: 45 U/L (ref 17–63)
ANION GAP: 14 (ref 5–15)
AST: 31 U/L (ref 15–41)
BILIRUBIN TOTAL: 0.3 mg/dL (ref 0.3–1.2)
BUN: 28 mg/dL — AB (ref 6–20)
CALCIUM: 8 mg/dL — AB (ref 8.9–10.3)
CO2: 31 mmol/L (ref 22–32)
Chloride: 95 mmol/L — ABNORMAL LOW (ref 101–111)
Creatinine, Ser: 6.21 mg/dL — ABNORMAL HIGH (ref 0.61–1.24)
GFR calc Af Amer: 11 mL/min — ABNORMAL LOW (ref >60)
GFR calc non Af Amer: 9 mL/min — ABNORMAL LOW (ref >60)
GLUCOSE: 139 mg/dL — AB (ref 65–99)
Potassium: 4 mmol/L (ref 3.5–5.1)
SODIUM: 140 mmol/L (ref 135–145)
TOTAL PROTEIN: 6.9 g/dL (ref 6.5–8.1)

## 2015-08-17 LAB — C DIFFICILE QUICK SCREEN W PCR REFLEX
C DIFFICILE (CDIFF) TOXIN: NEGATIVE
C DIFFICLE (CDIFF) ANTIGEN: NEGATIVE
C Diff interpretation: NEGATIVE

## 2015-08-17 LAB — LIPASE, BLOOD: Lipase: 26 U/L (ref 11–51)

## 2015-08-17 NOTE — ED Notes (Signed)
Diarrhea x 3-4 days.  Seen in ED on Tuesday.  Pt a dialysis pt, M-W-F.  Did not complete dialysis today.

## 2015-08-17 NOTE — ED Provider Notes (Signed)
[facility] Emergency Department Provider Note  ____________________________________________  Time seen: Approximately 8:18 PM  I have reviewed the triage vital signs and the nursing notes.   HISTORY  Chief Complaint Diarrhea    HPI Ryan Arnold is a 54 y.o. male with hypertension, hyperlipidemia, end-stage renal disease on hemodialysis Monday/Wednesday/Friday and dialyzed today, CHF, who presents for evaluation of 4 days of nonbloody diarrhea, gradual onset, constant since onset. The patient was seen here on 08/14/2015 for chest pain, nausea, vomiting as well as some diarrhea. He reports that his chest pain has resolved, his nausea vomiting has resolved however he is continuing to have persistent diarrhea today. No modifying factors. He has been using Imodium at times. No fevers. No known sick contacts. He denies any abdominal pain.   Past Medical History  Diagnosis Date  . Essential hypertension   . Hyperlipemia   . End stage renal disease (HCC)     a. on dialysis; still makes urine.  [name] BOOP (bronchiolitis obliterans with organizing pneumonia) (HCC)     a. 01/2018 s/p R thoracoscopy/Bx confirming BOOP;  b. 02/2018 high dose steroids started.  . Chronic diastolic CHF (congestive heart failure) (HCC)     a. 12/2014 Echo: EF 50-55%, mild conc LVH, mildly dil LA.  . Morbid obesity (HCC)   . Secondary hyperparathyroidism (HCC)   . Anemia of chronic disease   . Recurrent pneumonia     a. 2/2 BOOP.  [name] Chest pain     a. 06/2014 Myoview ([facility]) no ischemia/infarct, nl EF.  . Type 1 diabetes mellitus (HCC)   . Thyroid disease   . OSA treated with BiPAP     Patient Active Problem List   Diagnosis Date Noted  . Pure hypercholesterolemia 06/14/2015  . Impotence 06/05/2015  . Trouble swallowing 06/05/2015  . Preventative health care 06/05/2015  . PVC's (premature ventricular contractions) 04/23/2015  . Anemia of chronic disease   . Secondary  hyperparathyroidism (HCC)   . Morbid obesity (HCC)   . Chronic diastolic CHF (congestive heart failure) (HCC)   . Essential hypertension   . 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 Proliferative retinopathy 03/15/2015  . CKD (chronic kidney disease) stage V requiring chronic dialysis (HCC) 03/15/2015  . Hyperlipidemia 03/15/2015  . BOOP (bronchiolitis obliterans with organizing pneumonia) (HCC) 03/01/2015  . OSA (obstructive sleep apnea) 01/18/2015  . Recurrent pneumonia 01/15/2015    Past Surgical History  Procedure Laterality Date  . Cataract Bilateral   . Shoulder surgery Left   . Knee surgery Right   . Gallbladder surgery    . Lung biopsy      Current Outpatient Rx  Name  Route  Sig  Dispense  Refill  . allopurinol (ZYLOPRIM) 100 MG tablet   Oral   Take 1 tablet (100 mg total) by mouth daily.   90 tablet   1   . amLODipine (NORVASC) 10 MG tablet      take 1 tablet by mouth once daily   30 tablet   1   . aspirin 81 MG chewable tablet   Oral   Chew 1 tablet (81 mg total) by mouth daily.   30 tablet   2   . atorvastatin (LIPITOR) 40 MG tablet   Oral   Take 1 tablet (40 mg total) by mouth daily. Patient taking differently: Take 20 mg by mouth at bedtime.    90 tablet   3   . B-D ULTRAFINE III  SHORT PEN 31G X 8 MM MISC      use as directed   100 each   11     Phone number to our clinic (as requested) is [phone] ...   . EXPIRED: clonazePAM (KLONOPIN) 0.5 MG tablet   Oral   Take 1 tablet (0.5 mg total) by mouth at bedtime as needed. Patient taking differently: Take 0.5 mg by mouth at bedtime as needed for anxiety.    20 tablet   0   . DULoxetine (CYMBALTA) 60 MG capsule   Oral   Take 120 mg by mouth daily.      0   . furosemide (LASIX) 80 MG tablet   Oral   Take 1 tablet (80 mg total) by mouth 4 (four) times a week. Tuesday, Thursday, Saturday, and Sunday.   36 tablet   3   . gabapentin (NEURONTIN) 100 MG  capsule   Oral   Take 100 mg by mouth daily.      1   . hydrALAZINE (APRESOLINE) 25 MG tablet   Oral   Take 1 tablet (25 mg total) by mouth 3 (three) times daily.   90 tablet   1   . insulin aspart (NOVOLOG) 100 UNIT/ML FlexPen   Subcutaneous   Inject 22 Units into the skin 3 (three) times daily with meals.   15 mL   2   . LANTUS SOLOSTAR 100 UNIT/ML Solostar Pen      Inject 36 units at breakfast and 8 units at bedtime Patient taking differently: Inject 41 units at breakfast and 8 units at bedtime   15 mL   1     Dispense as written.    Dose Change   . metoprolol (LOPRESSOR) 50 MG tablet   Oral   Take 50 mg by mouth 2 (two) times daily.         . multivitamin (RENA-VIT) TABS tablet   Oral   Take 1 tablet by mouth daily.         . pantoprazole (PROTONIX) 40 MG tablet      take 1 tablet by mouth once daily   30 tablet   0   . predniSONE (DELTASONE) 20 MG tablet   Oral   Take 1.5 tablets (30 mg total) by mouth daily with breakfast. Takes 1 1/2 tablets daily   90 tablet   0   . SENSIPAR 30 MG tablet   Oral   Take 30 mg by mouth at bedtime.            Dispense as written.   . sevelamer carbonate (RENVELA) 800 MG tablet   Oral   Take 800 mg by mouth 3 (three) times daily. And 2 tablet with snacks         . valsartan (DIOVAN) 80 MG tablet   Oral   Take 80 mg by mouth at bedtime.      0     Allergies Oxycodone-acetaminophen  Family History  Problem Relation Age of Onset  . Diabetes Mother     died @ 16.  [name] Hypertension Mother   . Hypertension Maternal Grandmother     Social History Social History  Substance Use Topics  . Smoking status: Never Smoker   . Smokeless tobacco: Never Used  . Alcohol Use: No    Review of Systems Constitutional: No fever/chills Eyes: No visual changes. ENT: No sore throat. Cardiovascular: Denies chest pain. Respiratory: Denies shortness of breath. Gastrointestinal: No abdominal pain.  No  nausea, no  vomiting.  +diarrhea.  No constipation. Genitourinary: Negative for dysuria. Musculoskeletal: Negative for back pain. Skin: Negative for rash. Neurological: Negative for headaches, focal weakness or numbness.  10-point ROS otherwise negative.  ____________________________________________   PHYSICAL EXAM:  VITAL SIGNS: ED Triage Vitals  Enc Vitals Group     BP 08/17/15 1857 141/58 mmHg     Pulse Rate 08/17/15 1857 95     Resp 08/17/15 1857 18     Temp 08/17/15 1857 98.2 F (36.8 C)     Temp Source 08/17/15 1857 Oral     SpO2 08/17/15 1857 98 %     Weight 08/17/15 1857 298 lb (135.172 kg)     Height 08/17/15 1857 6\' 1"  (1.854 m)     Head Cir --      Peak Flow --      Pain Score 08/17/15 1858 0     Pain Loc --      Pain Edu? --      Excl. in GC? --     Constitutional: Alert and oriented. Well appearing and in no acute distress. Eyes: Conjunctivae are normal. PERRL. EOMI. Head: Atraumatic. Nose: No congestion/rhinnorhea. Mouth/Throat: Mucous membranes are moist.  Oropharynx non-erythematous. Neck: No stridor.  Cardiovascular: Normal rate, regular rhythm. Grossly normal heart sounds.  Good peripheral circulation. Respiratory: Normal respiratory effort.  No retractions. Lungs CTAB. Gastrointestinal: Soft and nontender. No distention. No abdominal bruits. No CVA tenderness. Genitourinary: deferred Musculoskeletal: No lower extremity tenderness nor edema.  No joint effusions. Neurologic:  Normal speech and language. No gross focal neurologic deficits are appreciated. No gait instability. Skin:  Skin is warm, dry and intact. No rash noted. Psychiatric: Mood and affect are normal. Speech and behavior are normal.  ____________________________________________   LABS (all labs ordered are listed, but only abnormal results are displayed)  Labs Reviewed  COMPREHENSIVE METABOLIC PANEL - Abnormal; Notable for the following:    Chloride 95 (*)    Glucose, Bld 139 (*)    BUN 28  (*)    Creatinine, Ser 6.21 (*)    Calcium 8.0 (*)    Albumin 3.4 (*)    GFR calc non Af Amer 9 (*)    GFR calc Af Amer 11 (*)    All other components within normal limits  CBC - Abnormal; Notable for the following:    WBC 11.5 (*)    RBC 3.86 (*)    Hemoglobin 12.1 (*)    HCT 37.2 (*)    RDW 17.1 (*)    All other components within normal limits  C DIFFICILE QUICK SCREEN W PCR REFLEX  STOOL CULTURE  GIARDIA, EIA; OVA/PARASITE  LIPASE, BLOOD  URINALYSIS COMPLETEWITH MICROSCOPIC ([facility] ONLY)  OVA + PARASITE EXAM   ____________________________________________  EKG  none ____________________________________________  RADIOLOGY  none ____________________________________________   PROCEDURES  Procedure(s) performed: None  Critical Care performed: No  ____________________________________________   INITIAL IMPRESSION / ASSESSMENT AND PLAN / ED COURSE  Pertinent labs & imaging results that were available during my care of the patient were reviewed by me and considered in my medical decision making (see chart for details).  WOODFORD STREGE is a 54 y.o. male with hypertension, hyperlipidemia, end-stage renal disease on hemodialysis Monday/Wednesday/Friday and dialyzed today, CHF, who presents for evaluation of 4 days of nonbloody diarrhea. On exam, he is nontoxic appearing and in no acute distress. Vital signs stable, he is afebrile. He has a benign abdominal examination. Diarrhea is his only complaint  at this time. Labs reviewed. Normal potassium. Creatinine is elevated at 6.21 but this is expected in the setting of his end-stage renal disease. CBC shows chronic mild leukocytosis and chronic mild anemia both of which are stable and unchanged from prior. Given that he has no abdominal pain, no tenderness on exam, no fevers, I do not think he requiresadvanced imaging of his abdomen today. We discussed that we would send stool cultures, C. Difficile and that he would continue  taking Imodium if his symptoms are severe. I suspect  Sequela of viral illness given onset with significant vomiting and I discussed with him that his diarrhea should be getting better within the next few days. He does not want to wait in the ER for C. difficile study to be processed. He will go home and I will call him if c.diff test is positive. Discussed that he needs to follow-up with his primary care doctor within the next few days for recheck. He is in agreement with this plan.  ----------------------------------------- 12:12 AM on 08/18/2015 -----------------------------------------  I review the chart. C. difficile is negative. ____________________________________________   FINAL CLINICAL IMPRESSION(S) / ED DIAGNOSES  Final diagnoses:  Diarrhea, unspecified type      Gayla DossEryka A Genesee Nase, MD 08/18/15 [phone]

## 2015-08-19 LAB — STOOL CULTURE: SPECIAL REQUESTS: NORMAL

## 2015-08-20 DIAGNOSIS — E11649 Type 2 diabetes mellitus with hypoglycemia without coma: Secondary | ICD-10-CM | POA: Diagnosis not present

## 2015-08-20 DIAGNOSIS — N186 End stage renal disease: Secondary | ICD-10-CM | POA: Diagnosis not present

## 2015-08-20 DIAGNOSIS — E1122 Type 2 diabetes mellitus with diabetic chronic kidney disease: Secondary | ICD-10-CM | POA: Diagnosis not present

## 2015-08-20 DIAGNOSIS — N2581 Secondary hyperparathyroidism of renal origin: Secondary | ICD-10-CM | POA: Diagnosis not present

## 2015-08-20 DIAGNOSIS — D631 Anemia in chronic kidney disease: Secondary | ICD-10-CM | POA: Diagnosis not present

## 2015-08-20 DIAGNOSIS — D509 Iron deficiency anemia, unspecified: Secondary | ICD-10-CM | POA: Diagnosis not present

## 2015-08-21 ENCOUNTER — Ambulatory Visit (INDEPENDENT_AMBULATORY_CARE_PROVIDER_SITE_OTHER): Payer: Medicare Other | Admitting: Family Medicine

## 2015-08-21 VITALS — Ht 73.0 in

## 2015-08-21 DIAGNOSIS — R197 Diarrhea, unspecified: Secondary | ICD-10-CM | POA: Diagnosis not present

## 2015-08-21 DIAGNOSIS — E785 Hyperlipidemia, unspecified: Secondary | ICD-10-CM

## 2015-08-21 DIAGNOSIS — J8489 Other specified interstitial pulmonary diseases: Secondary | ICD-10-CM | POA: Diagnosis not present

## 2015-08-21 DIAGNOSIS — I5032 Chronic diastolic (congestive) heart failure: Secondary | ICD-10-CM

## 2015-08-21 DIAGNOSIS — E108 Type 1 diabetes mellitus with unspecified complications: Secondary | ICD-10-CM

## 2015-08-21 DIAGNOSIS — I10 Essential (primary) hypertension: Secondary | ICD-10-CM

## 2015-08-21 NOTE — Progress Notes (Signed)
Pre visit review using our clinic review tool, if applicable. No additional management support is needed unless otherwise documented below in the visit note. 

## 2015-08-21 NOTE — Patient Instructions (Signed)
It was nice to see you.  Things are stable and you're doing well.  Continue close follow up with Dr. Dema SeverinMungal and Dr. Lucianne MussKumar.  Take care  Dr. Adriana Simasook

## 2015-08-21 NOTE — Progress Notes (Signed)
   Subjective:  Patient ID: Ryan Arnold, male    DOB: 05/05/1961  Age: 54 y.o. MRN: [id number]  CC: Follow up Diarrhea, Chronic medical conditions.  HPI:  54 year old male with a complicated PMH including ESRD, HTN, HLD, DM w/ complications, & BOOP presents for follow up.  1) Diarrhea  Has been seen in the ED for this on 11/4.  Negative C diff.  Has now resolved and he is feeling well.  2) HTN  BP at goal.  Doing well on Valsartan, Metoprolol, Norvasc, and Hydralazine.   3) HLD  Well controlled on statin  Social Hx   Social History   Social History  . Marital Status: Married    Spouse Name: N/A  . Number of Children: N/A  . Years of Education: N/A   Social History Main Topics  . Smoking status: Never Smoker   . Smokeless tobacco: Never Used  . Alcohol Use: No  . Drug Use: No  . Sexual Activity: Not on file   Other Topics Concern  . Not on file   Social History Narrative   Retired Emergency planning/management officerolice Officer.  Does not routinely exercise.   Review of Systems  Constitutional: Negative.   Respiratory: Positive for shortness of breath.    Objective:  Ht 6\' 1"  (1.854 m)  BP/Weight 08/17/2015 08/14/2015 08/10/2015  Systolic BP 139 152 125  Diastolic BP 79 74 68  Wt. (Lbs) 298 302 304  BMI 39.32 39.85 40.12   Physical Exam  Constitutional:  Obese male; NAD.   Cardiovascular: Normal rate and regular rhythm.   Pulmonary/Chest: Effort normal and breath sounds normal.  Abdominal: Soft. He exhibits no distension. There is no tenderness. There is no rebound and no guarding.  Neurological: He is alert.  Psychiatric: He has a normal mood and affect.  Vitals reviewed.   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 Recent A1C was 8.7. Encouraged close follow up with Endo.      Hyperlipidemia    Stable. Continue lipitor.      Essential hypertension - Primary    At goal.  Continue current therapy.      Diarrhea    Resolved.      Chronic diastolic CHF (congestive heart failure) (HCC)    Has had recent follow up w/ Cardiology. Stable at this time.      BOOP (bronchiolitis obliterans with organizing pneumonia) (HCC)    Remains on 30 mg of Prednisone. Has follow up soon with Pulm.         Follow-up: Kandice RobinsonsFebuary  Azai Gaffin, DO

## 2015-08-22 DIAGNOSIS — R197 Diarrhea, unspecified: Secondary | ICD-10-CM | POA: Insufficient documentation

## 2015-08-22 DIAGNOSIS — D509 Iron deficiency anemia, unspecified: Secondary | ICD-10-CM | POA: Diagnosis not present

## 2015-08-22 DIAGNOSIS — E1122 Type 2 diabetes mellitus with diabetic chronic kidney disease: Secondary | ICD-10-CM | POA: Diagnosis not present

## 2015-08-22 DIAGNOSIS — N186 End stage renal disease: Secondary | ICD-10-CM | POA: Diagnosis not present

## 2015-08-22 DIAGNOSIS — D631 Anemia in chronic kidney disease: Secondary | ICD-10-CM | POA: Diagnosis not present

## 2015-08-22 DIAGNOSIS — N2581 Secondary hyperparathyroidism of renal origin: Secondary | ICD-10-CM | POA: Diagnosis not present

## 2015-08-22 DIAGNOSIS — E11649 Type 2 diabetes mellitus with hypoglycemia without coma: Secondary | ICD-10-CM | POA: Diagnosis not present

## 2015-08-22 NOTE — Assessment & Plan Note (Signed)
At goal.  Continue current therapy. 

## 2015-08-22 NOTE — Assessment & Plan Note (Signed)
Stable. Continue lipitor. 

## 2015-08-22 NOTE — Assessment & Plan Note (Signed)
Resolved

## 2015-08-22 NOTE — Assessment & Plan Note (Signed)
Remains on 30 mg of Prednisone. Has follow up soon with Pulm.

## 2015-08-22 NOTE — Assessment & Plan Note (Signed)
Glycemic control complicated by chronic prednisone use. Recent A1C was 8.7. Encouraged close follow up with Endo.

## 2015-08-22 NOTE — Assessment & Plan Note (Signed)
Has had recent follow up w/ Cardiology. Stable at this time.

## 2015-08-24 DIAGNOSIS — N2581 Secondary hyperparathyroidism of renal origin: Secondary | ICD-10-CM | POA: Diagnosis not present

## 2015-08-24 DIAGNOSIS — D509 Iron deficiency anemia, unspecified: Secondary | ICD-10-CM | POA: Diagnosis not present

## 2015-08-24 DIAGNOSIS — N186 End stage renal disease: Secondary | ICD-10-CM | POA: Diagnosis not present

## 2015-08-24 DIAGNOSIS — D631 Anemia in chronic kidney disease: Secondary | ICD-10-CM | POA: Diagnosis not present

## 2015-08-24 DIAGNOSIS — E11649 Type 2 diabetes mellitus with hypoglycemia without coma: Secondary | ICD-10-CM | POA: Diagnosis not present

## 2015-08-24 DIAGNOSIS — E1122 Type 2 diabetes mellitus with diabetic chronic kidney disease: Secondary | ICD-10-CM | POA: Diagnosis not present

## 2015-08-25 DIAGNOSIS — E1122 Type 2 diabetes mellitus with diabetic chronic kidney disease: Secondary | ICD-10-CM | POA: Diagnosis not present

## 2015-08-25 DIAGNOSIS — N186 End stage renal disease: Secondary | ICD-10-CM | POA: Diagnosis not present

## 2015-08-25 DIAGNOSIS — E11649 Type 2 diabetes mellitus with hypoglycemia without coma: Secondary | ICD-10-CM | POA: Diagnosis not present

## 2015-08-25 DIAGNOSIS — D631 Anemia in chronic kidney disease: Secondary | ICD-10-CM | POA: Diagnosis not present

## 2015-08-25 DIAGNOSIS — N2581 Secondary hyperparathyroidism of renal origin: Secondary | ICD-10-CM | POA: Diagnosis not present

## 2015-08-25 DIAGNOSIS — D509 Iron deficiency anemia, unspecified: Secondary | ICD-10-CM | POA: Diagnosis not present

## 2015-08-27 DIAGNOSIS — E1122 Type 2 diabetes mellitus with diabetic chronic kidney disease: Secondary | ICD-10-CM | POA: Diagnosis not present

## 2015-08-27 DIAGNOSIS — N2581 Secondary hyperparathyroidism of renal origin: Secondary | ICD-10-CM | POA: Diagnosis not present

## 2015-08-27 DIAGNOSIS — E11649 Type 2 diabetes mellitus with hypoglycemia without coma: Secondary | ICD-10-CM | POA: Diagnosis not present

## 2015-08-27 DIAGNOSIS — D631 Anemia in chronic kidney disease: Secondary | ICD-10-CM | POA: Diagnosis not present

## 2015-08-27 DIAGNOSIS — D509 Iron deficiency anemia, unspecified: Secondary | ICD-10-CM | POA: Diagnosis not present

## 2015-08-27 DIAGNOSIS — N186 End stage renal disease: Secondary | ICD-10-CM | POA: Diagnosis not present

## 2015-08-28 ENCOUNTER — Ambulatory Visit (INDEPENDENT_AMBULATORY_CARE_PROVIDER_SITE_OTHER): Payer: Medicare Other | Admitting: Internal Medicine

## 2015-08-28 ENCOUNTER — Encounter: Payer: Self-pay | Admitting: Internal Medicine

## 2015-08-28 ENCOUNTER — Ambulatory Visit: Payer: BC Managed Care – PPO | Admitting: Endocrinology

## 2015-08-28 VITALS — BP 136/76 | HR 94 | Ht 73.0 in | Wt 300.6 lb

## 2015-08-28 DIAGNOSIS — N2581 Secondary hyperparathyroidism of renal origin: Secondary | ICD-10-CM | POA: Diagnosis not present

## 2015-08-28 DIAGNOSIS — J8489 Other specified interstitial pulmonary diseases: Secondary | ICD-10-CM | POA: Diagnosis not present

## 2015-08-28 DIAGNOSIS — N186 End stage renal disease: Secondary | ICD-10-CM | POA: Diagnosis not present

## 2015-08-28 DIAGNOSIS — E1122 Type 2 diabetes mellitus with diabetic chronic kidney disease: Secondary | ICD-10-CM | POA: Diagnosis not present

## 2015-08-28 DIAGNOSIS — D509 Iron deficiency anemia, unspecified: Secondary | ICD-10-CM | POA: Diagnosis not present

## 2015-08-28 DIAGNOSIS — D631 Anemia in chronic kidney disease: Secondary | ICD-10-CM | POA: Diagnosis not present

## 2015-08-28 DIAGNOSIS — E11649 Type 2 diabetes mellitus with hypoglycemia without coma: Secondary | ICD-10-CM | POA: Diagnosis not present

## 2015-08-28 MED ORDER — PREDNISONE 5 MG PO TABS: ORAL_TABLET | ORAL | Status: DC

## 2015-08-28 NOTE — Progress Notes (Signed)
MRN# [id number] Ryan Arnold 06-Oct-1961   CC: Chief Complaint  Patient presents with  . Follow-up    BOOP. pt. states breathing is up and down. occ. SOB. denies wheezing, cough or chest pain/tightness. feels that he is still winded since starting prednisone. on 3L 02   Synopsis: 54 year old male past medical history of end-stage renal disease on dialysis, uncontrolled diabetes, hypertension, former retired Emergency planning/management officer, seen in consultation for recurrent pneumonias. Multiple hospitalizations at [facility] and at [facility] over the past one year for bilateral groundglass opacities throughout entire lung fields requiring multiple rounds of antibiotics and steroids. Had a bronchoscopy done at [facility] and within the last 2 years no acute findings from the BAL. Patient with recent bronchoscopy, March 2016, at [facility] by Dr. Dema Severin, BAL with no acute findings. April 2016 surgical lung bx (wedge) by Dr. Thelma Barge, path confirms BOOP, started on high dose steroids.   Events since last clinic visit: Patient presents today for a BOOP follow up, he is currently on  prednisone. Recent ED visit on 11/4 for Diarrhea, c.diff neg, followed up with PCP, now resolved.  Patient states today that he is doing well, he's been stable on 30 minute as a prednisone from a respiratory standpoint. Patient states that he was recently seen in the ED for about a week of diarrhea, nausea and vomiting, he follow-up with his primary care physician, is currently doing well. Patient states he has good and bad days for his breathing, some days he experiences more shortness of breath than normal, but overall his respiratory status is improving since being on steroids. Patient is currently also being followed by the heart failure clinic      Medication:   Current Outpatient Rx  Name  Route  Sig  Dispense  Refill  . allopurinol (ZYLOPRIM) 100 MG tablet   Oral   Take 1 tablet (100 mg total) by mouth daily.   90 tablet   1   . amLODipine  (NORVASC) 10 MG tablet      take 1 tablet by mouth once daily   30 tablet   1   . aspirin 81 MG chewable tablet   Oral   Chew 1 tablet (81 mg total) by mouth daily.   30 tablet   2   . atorvastatin (LIPITOR) 40 MG tablet   Oral   Take 1 tablet (40 mg total) by mouth daily. Patient taking differently: Take 20 mg by mouth at bedtime.    90 tablet   3   . B-D ULTRAFINE III SHORT PEN 31G X 8 MM MISC      use as directed   100 each   11     Phone number to our clinic (as requested) is [phone] ...   . DULoxetine (CYMBALTA) 60 MG capsule   Oral   Take 120 mg by mouth daily.      0   . furosemide (LASIX) 80 MG tablet   Oral   Take 1 tablet (80 mg total) by mouth 4 (four) times a week. Tuesday, Thursday, Saturday, and Sunday.   36 tablet   3   . gabapentin (NEURONTIN) 100 MG capsule   Oral   Take 100 mg by mouth daily.      1   . hydrALAZINE (APRESOLINE) 25 MG tablet   Oral   Take 1 tablet (25 mg total) by mouth 3 (three) times daily.   90 tablet   1   . insulin aspart (NOVOLOG) 100  UNIT/ML FlexPen   Subcutaneous   Inject 22 Units into the skin 3 (three) times daily with meals.   15 mL   2   . LANTUS SOLOSTAR 100 UNIT/ML Solostar Pen      Inject 36 units at breakfast and 8 units at bedtime Patient taking differently: Inject 41 units at breakfast and 8 units at bedtime   15 mL   1     Dispense as written.    Dose Change   . metoprolol (LOPRESSOR) 50 MG tablet   Oral   Take 50 mg by mouth 2 (two) times daily.         . multivitamin (RENA-VIT) TABS tablet   Oral   Take 1 tablet by mouth daily.         . pantoprazole (PROTONIX) 40 MG tablet      take 1 tablet by mouth once daily   30 tablet   0   . predniSONE (DELTASONE) 20 MG tablet   Oral   Take 1.5 tablets (30 mg total) by mouth daily with breakfast. Takes 1 1/2 tablets daily   90 tablet   0   . SENSIPAR 30 MG tablet   Oral   Take 30 mg by mouth at bedtime.            Dispense as  written.   . sevelamer carbonate (RENVELA) 800 MG tablet   Oral   Take 800 mg by mouth 3 (three) times daily. And 2 tablet with snacks         . valsartan (DIOVAN) 80 MG tablet   Oral   Take 80 mg by mouth at bedtime.      0   . EXPIRED: clonazePAM (KLONOPIN) 0.5 MG tablet   Oral   Take 1 tablet (0.5 mg total) by mouth at bedtime as needed. Patient taking differently: Take 0.5 mg by mouth at bedtime as needed for anxiety.    20 tablet   0      Review of Systems: Gen:  Denies  fever, sweats, chills HEENT: Denies blurred vision, double vision, ear pain, eye pain, hearing loss, nose bleeds, sore throat Cvc:  No dizziness, chest pain or heaviness Resp:   Admits ZO:XWRUEA, mild cough, not worsening Gi: Denies swallowing difficulty, stomach pain, nausea or vomiting, diarrhea, constipation, bowel incontinence Gu:  Denies bladder incontinence, burning urine Ext:   No Joint pain, stiffness or swelling Skin: No skin rash, easy bruising or bleeding or hives Endoc:  No polyuria, polydipsia , polyphagia or weight change Other:  All other systems negative  Allergies:  Oxycodone-acetaminophen  Physical Examination:  VS: BP 136/76 mmHg  Pulse 94  Ht  (1.854 m)  Wt 300 lb 9.6 oz (136.351 kg)  BMI 39.67 kg/m2  SpO2 98%  General Appearance: No distress  HEENT: PERRLA, no ptosis, no other lesions noticed Pulmonary: Good respiratory effort, mild decreased breath sounds at the bases(this is baseline), no wheezes, no crackles Cardiovascular:  Normal S1,S2.  No m/r/g.     Abdomen:Exam: Benign, Soft, non-tender, No masses  Skin:   warm, no ecchymosis, fine milia appearance lesions on the face and forehead (no erythema, no eruption) Extremities: normal, no cyanosis, clubbing, warm with normal capillary refill.      Rad results: (The following images and results were reviewed by Dr. Dema Severin on 08/28/2015).  CXR 08/14/15 CHEST 2 VIEW  COMPARISON: 03/19/2015  FINDINGS: Mild  cardiomegaly. Blunting of the right costophrenic angle likely reflects scarring/ pleural thickening.  No confluent airspace opacities, effusions or edema. No acute bony abnormality.  IMPRESSION: Cardiomegaly. No active disease.   CXR 03/19/15 CHEST 2 VIEW  COMPARISON: 03/18/2015  FINDINGS: Progression of bilateral airspace disease left greater than right. Cardiac enlargement. Right pleural effusion is small and unchanged. No significant effusion on the left. No pneumothorax.  IMPRESSION: Progression of bilateral airspace disease left greater than right. Differential includes pulmonary infection and pulmonary edema. Pulmonary hemorrhage also a possibility.    Assessment and Plan:54 yo male seen in follow up for BOOP. Patient doing well on current dose. No exacerbations of SOB, no signs of infection at this time. 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 Today we will start weaning his prednisone from 30 to 20 to 15mg  He is advised to increase to 30 mg of prednisone, if his symptoms return  Plan: - continue with Prednisone 30mg  (1.5pills) x 1 week, then 20mg  x 1 month, then 15mg  until follow-up visit. Patient is advised if symptoms return, and is persistent for 3-4 days, then increase prednisone to 30 mg daily until follow-up visit - Anticipate steroid treatment for 6-9 months (total). If there is another relapse of BOOP, will have to consider another bronchoscopy to rule out atypical infections exacerbating BOOP. - continue with 3L O2 with exertion and with sleep - Patient advised to Informed diabetic specialist/endocrinologist/primary care physician about reduction in steroids dose, evaluation for bone density study

## 2015-08-28 NOTE — Addendum Note (Signed)
Addended by: Maxwell MarionBLANKENSHIP, Lawanda Holzheimer A on: 08/28/2015 03:58 PM   Modules accepted: Orders

## 2015-08-28 NOTE — Patient Instructions (Signed)
folow up with Dr. Dema SeverinMungal in 2 months - Prednisone 30 mg 1 week - Starting November 22 prednisone 20 mg 1 month, last dose last dose of 20 mg will be December 20 - Starting December 21, prednisone 15 mg, 1 tab daily with breakfast, until follow up visit. - Continue with supplemental oxygen, 3 L continuously -Continue with diet, start an exercise program, preferably water aerobics.

## 2015-08-28 NOTE — Assessment & Plan Note (Signed)
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 Today we will start weaning his prednisone from 30 to 20 to 15mg  He is advised to increase to 30 mg of prednisone, if his symptoms return  Plan: - continue with Prednisone 30mg  (1.5pills) x 1 week, then 20mg  x 1 month, then 15mg  until follow-up visit. Patient is advised if symptoms return, and is persistent for 3-4 days, then increase prednisone to 30 mg daily until follow-up visit - Anticipate steroid treatment for 6-9 months (total). If there is another relapse of BOOP, will have to consider another bronchoscopy to rule out atypical infections exacerbating BOOP. - continue with 3L O2 with exertion and with sleep - Patient advised to Informed diabetic specialist/endocrinologist/primary care physician about reduction in steroids dose, evaluation for bone density study

## 2015-08-29 DIAGNOSIS — E1122 Type 2 diabetes mellitus with diabetic chronic kidney disease: Secondary | ICD-10-CM | POA: Diagnosis not present

## 2015-08-29 DIAGNOSIS — E11649 Type 2 diabetes mellitus with hypoglycemia without coma: Secondary | ICD-10-CM | POA: Diagnosis not present

## 2015-08-29 DIAGNOSIS — N186 End stage renal disease: Secondary | ICD-10-CM | POA: Diagnosis not present

## 2015-08-29 DIAGNOSIS — D509 Iron deficiency anemia, unspecified: Secondary | ICD-10-CM | POA: Diagnosis not present

## 2015-08-29 DIAGNOSIS — D631 Anemia in chronic kidney disease: Secondary | ICD-10-CM | POA: Diagnosis not present

## 2015-08-29 DIAGNOSIS — N2581 Secondary hyperparathyroidism of renal origin: Secondary | ICD-10-CM | POA: Diagnosis not present

## 2015-08-30 ENCOUNTER — Encounter: Payer: Self-pay | Admitting: Endocrinology

## 2015-08-30 ENCOUNTER — Ambulatory Visit (INDEPENDENT_AMBULATORY_CARE_PROVIDER_SITE_OTHER): Payer: Medicare Other | Admitting: Endocrinology

## 2015-08-30 VITALS — BP 122/62 | HR 85 | Temp 98.0°F | Resp 16 | Ht 73.0 in | Wt 306.8 lb

## 2015-08-30 DIAGNOSIS — E1065 Type 1 diabetes mellitus with hyperglycemia: Secondary | ICD-10-CM | POA: Diagnosis not present

## 2015-08-30 NOTE — Progress Notes (Signed)
Patient ID: Ryan Arnold, male   DOB: 09/25/1961, 54 y.o.   MRN: [id number]    Chief complaint : Follow up of Type 1 Diabetes  History of Present Illness:          Date of diagnosis: Age 54      Prior history:     He has been on various insulin regimens  since diagnosis.   Was on insulin pump 2 years ago, and then taken off pump during hospitalization and never put back on it.  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 Novolog 22-26-32 before meals +/- correction doses  Recent history:  He has been on large doses of prednisone in June for his pulmonary condition and blood sugars have been  higher  Currently is taking 30 mg, will be on 20 milligrams on 20th November Despite reminders to modify his management including taking Novolog at suppertime on each visit he is still not doing so and his blood sugars are still poorly controlled  Current blood sugar patterns, problems and treatment regimen:  He was told to increase his Lantus to 50 units but he is still taking only 46  Also he was advised to check blood sugars at various times a day including before supper but he is not doing this  He says his blood sugars were higher a week or 2 ago because of having a viral infection  His fasting blood sugars are usually much higher if he checks them early in the morning at 5 AM before going for dialysis but only sometimes high if checked at 10 AM  POSTPRANDIAL readings after supper are markedly increased.  He is consistently not remembering to take his insulin before he is eating as he does not want to do it while eating out in the restaurant.  Also not checking blood sugars before eating and doing any correction  Does not always make good choices when eating out in the evening  Recent A1c not available  No hypoglycemia with reducing his evening Lantus slightly  Glucose  monitoring:  done about 2 times a day         Glucometer: One Touch.      Blood Glucose reading analysis from meter download:   Mean values apply above for all meters except median for One Touch  PRE-MEAL  5 AM   10 AM  Dinner Bedtime Overall  Glucose range:  181-405   78-210    91-513    Mean/median:  259   165    355   210     Symptoms of hypoglycemia: Weakness and shakiness Self-care:   Meal times:  breakfast 5 am on dialysis days otherwise his first meal is at 12 noon, dinner 6 pm  Exercise:.  unable to do any         Most recent dietitian/nurse educator visit : Years ago           Diabetes labs:  Lab Results  Component Value Date   HGBA1C 8.7 06/14/2015   HGBA1C 7.0* 03/15/2015   HGBA1C 7.8* 01/08/2015   Lab Results  Component Value Date   LDLCALC 65 06/14/2015   CREATININE 6.21* 08/17/2015        Medication List       This list is accurate as of: 08/30/15 11:59 PM.  Always use your most recent med list.  allopurinol 100 MG tablet  Commonly known as:  ZYLOPRIM  Take 1 tablet (100 mg total) by mouth daily.     amLODipine 10 MG tablet  Commonly known as:  NORVASC  take 1 tablet by mouth once daily     aspirin 81 MG chewable tablet  Chew 1 tablet (81 mg total) by mouth daily.     atorvastatin 40 MG tablet  Commonly known as:  LIPITOR  Take 1 tablet (40 mg total) by mouth daily.     B-D ULTRAFINE III SHORT PEN 31G X 8 MM Misc  Generic drug:  Insulin Pen Needle  use as directed     clonazePAM 0.5 MG tablet  Commonly known as:  KLONOPIN  Take 0.5 mg by mouth at bedtime.     DULoxetine 60 MG capsule  Commonly known as:  CYMBALTA  Take 120 mg by mouth daily.     furosemide 80 MG tablet  Commonly known as:  LASIX  Take 1 tablet (80 mg total) by mouth 4 (four) times a week. Tuesday, Thursday, Saturday, and Sunday.     gabapentin 100 MG capsule  Commonly known as:  NEURONTIN  Take 100 mg by mouth daily.     hydrALAZINE 25 MG tablet    Commonly known as:  APRESOLINE  Take 1 tablet (25 mg total) by mouth 3 (three) times daily.     insulin aspart 100 UNIT/ML FlexPen  Commonly known as:  NOVOLOG  Inject 22 Units into the skin 3 (three) times daily with meals.     LANTUS SOLOSTAR 100 UNIT/ML Solostar Pen  Generic drug:  Insulin Glargine  Inject 36 units at breakfast and 8 units at bedtime     metoprolol 50 MG tablet  Commonly known as:  LOPRESSOR  Take 50 mg by mouth 2 (two) times daily.     multivitamin Tabs tablet  Take 1 tablet by mouth daily.     pantoprazole 40 MG tablet  Commonly known as:  PROTONIX  take 1 tablet by mouth once daily     predniSONE 20 MG tablet  Commonly known as:  DELTASONE  Take 1.5 tablets (30 mg total) by mouth daily with breakfast. Takes 1 1/2 tablets daily     predniSONE 5 MG tablet  Commonly known as:  DELTASONE  20mg  daily for 1 month then 15mg  daily for 1 month     SENSIPAR 30 MG tablet  Generic drug:  cinacalcet  Take 30 mg by mouth at bedtime.     sevelamer carbonate 800 MG tablet  Commonly known as:  RENVELA  Take 800 mg by mouth 3 (three) times daily. And 2 tablet with snacks     valsartan 80 MG tablet  Commonly known as:  DIOVAN  Take 80 mg by mouth at bedtime.        Allergies:  Allergies  Allergen Reactions  . Oxycodone-Acetaminophen Nausea And Vomiting    Past Medical History  Diagnosis Date  . Essential hypertension   . Hyperlipemia   . End stage renal disease (HCC)     a. on dialysis; still makes urine.  [name] BOOP (bronchiolitis obliterans with organizing pneumonia) (HCC)     a. 01/2018 s/p R thoracoscopy/Bx confirming BOOP;  b. 02/2018 high dose steroids started.  . Chronic diastolic CHF (congestive heart failure) (HCC)     a. 12/2014 Echo: EF 50-55%, mild conc LVH, mildly dil LA.  . Morbid obesity (HCC)   . Secondary hyperparathyroidism (HCC)   .  Anemia of chronic disease   . Recurrent pneumonia     a. 2/2 BOOP.  [name] Chest pain     a. 06/2014  Myoview ([facility]) no ischemia/infarct, nl EF.  . Type 1 diabetes mellitus (HCC)   . Thyroid disease   . OSA treated with BiPAP     Past Surgical History  Procedure Laterality Date  . Cataract Bilateral   . Shoulder surgery Left   . Knee surgery Right   . Gallbladder surgery    . Lung biopsy      Family History  Problem Relation Age of Onset  . Diabetes Mother     died @ 7.  [name] Hypertension Mother   . Hypertension Maternal Grandmother     Social History:  reports that he has never smoked. He has never used smokeless tobacco. He reports that he does not drink alcohol or use illicit drugs.    Review of Systems:   CKD: He is on dialysis, goes on Mondays, Wednesdays and Fridays  He is on steroids for bronchiolitis obliterans and will need to be continued on prednisone He takes prednisone 30 mg in the morning now, previously on 50 mg  He was treated by [facility] for leg ulcers  Lipids: Appear poorly controlled as of 6/16 but previously lipids were good.  Unable to identify LDL value on previous lab.  He thinks he is compliant with his Lipitor  Lab Results  Component Value Date   CHOL 194 06/14/2015   HDL 99.30 06/14/2015   LDLCALC 65 06/14/2015   TRIG 151.0* 06/14/2015   CHOLHDL 2 06/14/2015    Diabetes complications: Nephropathy   Physical Examination:  BP 122/62 mmHg  Pulse 85  Temp(Src) 98 F (36.7 C)  Resp 16  Ht  (1.854 m)  Wt 306 lb 12.8 oz (139.164 kg)  BMI 40.49 kg/m2  SpO2 98%         ASSESSMENT: See history of present illness for detailed discussion of his current management, blood sugar patterns and problems identified  Diabetes type 1 with consistently poor control with mostly postprandial hyperglycemia  Problems identified:  He is still not able to comply with instructions on adjusting his insulin and following instructions and timing of the insulin  Unable to analyze his blood sugars during the day as he is not checking blood sugars  before dinner  His only good readings are after his first meal in the morning; also today he feels slightly hypoglycemic since he had a smaller meal and recently has had a decreased appetite also  FASTING blood sugars are inadequate controlled, probably because of marked increase in blood sugar the night before and sometimes having higher fat meals with eating out every evening.  Blood sugars are relatively better if he checks them later in the morning at about 10 AM fasting   He does not know how to adjust his insulin based on what he is eating including in the morning  He is very limited in his activity level and has difficulty losing weight  PLAN:   He will make sure he takes the Novolog before eating, he can take his insulin pen to the restroom in the restaurant  He will need to check blood sugars before supper consistently also  Discussed needing to restart his insulin pump.  Discussed that he will be better controlled and he will have better compliance with mealtime insulin.  He will call the pump company and also discuss starting the pump with  the nurse educator here.  He will ask for supplies as needed  Discussed timing of insulin and glucose monitoring  He will need more instruction carbohydrate counting and improved diet  Increase activity as tolerated  Discussed that we need to increase his Lantus by at least 5 units in the morning since most likely his sugars are high before supper as long as he is on prednisone  Will reduce his morning Novolog by 4 units since he is eating smaller meals in the morning  Patient Instructions  Reduce am base Novolog to 18  Must take pm Novolog before eating  Lantus 50 units in am, reduce the dose if sugar is <100 at supper  Restart pump, call our nurse Bonita Quin     Counseling time on subjects discussed above is over 50% of today's 25 minute visit  Peng Thorstenson 08/31/2015, 7:59 AM

## 2015-08-30 NOTE — Patient Instructions (Signed)
Reduce am base Novolog to 18  Must take pm Novolog before eating  Lantus 50 units in am, reduce the dose if sugar is <100 at supper  Restart pump, call our nurse Bonita QuinLinda

## 2015-08-31 DIAGNOSIS — N2581 Secondary hyperparathyroidism of renal origin: Secondary | ICD-10-CM | POA: Diagnosis not present

## 2015-08-31 DIAGNOSIS — D631 Anemia in chronic kidney disease: Secondary | ICD-10-CM | POA: Diagnosis not present

## 2015-08-31 DIAGNOSIS — E1122 Type 2 diabetes mellitus with diabetic chronic kidney disease: Secondary | ICD-10-CM | POA: Diagnosis not present

## 2015-08-31 DIAGNOSIS — E11649 Type 2 diabetes mellitus with hypoglycemia without coma: Secondary | ICD-10-CM | POA: Diagnosis not present

## 2015-08-31 DIAGNOSIS — Z600 Problems of adjustment to life-cycle transitions: Secondary | ICD-10-CM | POA: Diagnosis not present

## 2015-08-31 DIAGNOSIS — N186 End stage renal disease: Secondary | ICD-10-CM | POA: Diagnosis not present

## 2015-08-31 DIAGNOSIS — F419 Anxiety disorder, unspecified: Secondary | ICD-10-CM | POA: Diagnosis not present

## 2015-08-31 DIAGNOSIS — F3342 Major depressive disorder, recurrent, in full remission: Secondary | ICD-10-CM | POA: Diagnosis not present

## 2015-08-31 DIAGNOSIS — D509 Iron deficiency anemia, unspecified: Secondary | ICD-10-CM | POA: Diagnosis not present

## 2015-09-02 DIAGNOSIS — E1122 Type 2 diabetes mellitus with diabetic chronic kidney disease: Secondary | ICD-10-CM | POA: Diagnosis not present

## 2015-09-02 DIAGNOSIS — N186 End stage renal disease: Secondary | ICD-10-CM | POA: Diagnosis not present

## 2015-09-02 DIAGNOSIS — D631 Anemia in chronic kidney disease: Secondary | ICD-10-CM | POA: Diagnosis not present

## 2015-09-02 DIAGNOSIS — E11649 Type 2 diabetes mellitus with hypoglycemia without coma: Secondary | ICD-10-CM | POA: Diagnosis not present

## 2015-09-02 DIAGNOSIS — D509 Iron deficiency anemia, unspecified: Secondary | ICD-10-CM | POA: Diagnosis not present

## 2015-09-02 DIAGNOSIS — N2581 Secondary hyperparathyroidism of renal origin: Secondary | ICD-10-CM | POA: Diagnosis not present

## 2015-09-04 DIAGNOSIS — D509 Iron deficiency anemia, unspecified: Secondary | ICD-10-CM | POA: Diagnosis not present

## 2015-09-04 DIAGNOSIS — E11649 Type 2 diabetes mellitus with hypoglycemia without coma: Secondary | ICD-10-CM | POA: Diagnosis not present

## 2015-09-04 DIAGNOSIS — N186 End stage renal disease: Secondary | ICD-10-CM | POA: Diagnosis not present

## 2015-09-04 DIAGNOSIS — N2581 Secondary hyperparathyroidism of renal origin: Secondary | ICD-10-CM | POA: Diagnosis not present

## 2015-09-04 DIAGNOSIS — D631 Anemia in chronic kidney disease: Secondary | ICD-10-CM | POA: Diagnosis not present

## 2015-09-04 DIAGNOSIS — E1122 Type 2 diabetes mellitus with diabetic chronic kidney disease: Secondary | ICD-10-CM | POA: Diagnosis not present

## 2015-09-07 DIAGNOSIS — N2581 Secondary hyperparathyroidism of renal origin: Secondary | ICD-10-CM | POA: Diagnosis not present

## 2015-09-07 DIAGNOSIS — E11649 Type 2 diabetes mellitus with hypoglycemia without coma: Secondary | ICD-10-CM | POA: Diagnosis not present

## 2015-09-07 DIAGNOSIS — D509 Iron deficiency anemia, unspecified: Secondary | ICD-10-CM | POA: Diagnosis not present

## 2015-09-07 DIAGNOSIS — D631 Anemia in chronic kidney disease: Secondary | ICD-10-CM | POA: Diagnosis not present

## 2015-09-07 DIAGNOSIS — E1122 Type 2 diabetes mellitus with diabetic chronic kidney disease: Secondary | ICD-10-CM | POA: Diagnosis not present

## 2015-09-07 DIAGNOSIS — N186 End stage renal disease: Secondary | ICD-10-CM | POA: Diagnosis not present

## 2015-09-08 DIAGNOSIS — N186 End stage renal disease: Secondary | ICD-10-CM | POA: Diagnosis not present

## 2015-09-08 DIAGNOSIS — E1122 Type 2 diabetes mellitus with diabetic chronic kidney disease: Secondary | ICD-10-CM | POA: Diagnosis not present

## 2015-09-08 DIAGNOSIS — D509 Iron deficiency anemia, unspecified: Secondary | ICD-10-CM | POA: Diagnosis not present

## 2015-09-08 DIAGNOSIS — N2581 Secondary hyperparathyroidism of renal origin: Secondary | ICD-10-CM | POA: Diagnosis not present

## 2015-09-08 DIAGNOSIS — E11649 Type 2 diabetes mellitus with hypoglycemia without coma: Secondary | ICD-10-CM | POA: Diagnosis not present

## 2015-09-08 DIAGNOSIS — D631 Anemia in chronic kidney disease: Secondary | ICD-10-CM | POA: Diagnosis not present

## 2015-09-09 ENCOUNTER — Other Ambulatory Visit: Payer: Self-pay | Admitting: Family Medicine

## 2015-09-10 DIAGNOSIS — N186 End stage renal disease: Secondary | ICD-10-CM | POA: Diagnosis not present

## 2015-09-10 DIAGNOSIS — D631 Anemia in chronic kidney disease: Secondary | ICD-10-CM | POA: Diagnosis not present

## 2015-09-10 DIAGNOSIS — N2581 Secondary hyperparathyroidism of renal origin: Secondary | ICD-10-CM | POA: Diagnosis not present

## 2015-09-10 DIAGNOSIS — D509 Iron deficiency anemia, unspecified: Secondary | ICD-10-CM | POA: Diagnosis not present

## 2015-09-10 DIAGNOSIS — E11649 Type 2 diabetes mellitus with hypoglycemia without coma: Secondary | ICD-10-CM | POA: Diagnosis not present

## 2015-09-10 DIAGNOSIS — E1122 Type 2 diabetes mellitus with diabetic chronic kidney disease: Secondary | ICD-10-CM | POA: Diagnosis not present

## 2015-09-11 ENCOUNTER — Telehealth: Payer: Self-pay | Admitting: Nutrition

## 2015-09-11 ENCOUNTER — Telehealth: Payer: Self-pay | Admitting: Internal Medicine

## 2015-09-11 NOTE — Telephone Encounter (Signed)
Patient says that Dr. Dema SeverinMungal is treating him for BOOP.  Last few days, patient has been coughing up a lot of yellow phlegm; some tightness and heaviness in chest, no abnormal SOB.  Currently take Prednisone 20mg  daily. [facility] [address]  Allergies  Allergen Reactions  . Oxycodone-Acetaminophen Nausea And Vomiting

## 2015-09-12 DIAGNOSIS — N2581 Secondary hyperparathyroidism of renal origin: Secondary | ICD-10-CM | POA: Diagnosis not present

## 2015-09-12 DIAGNOSIS — N186 End stage renal disease: Secondary | ICD-10-CM | POA: Diagnosis not present

## 2015-09-12 DIAGNOSIS — E1122 Type 2 diabetes mellitus with diabetic chronic kidney disease: Secondary | ICD-10-CM | POA: Diagnosis not present

## 2015-09-12 DIAGNOSIS — D509 Iron deficiency anemia, unspecified: Secondary | ICD-10-CM | POA: Diagnosis not present

## 2015-09-12 DIAGNOSIS — E11649 Type 2 diabetes mellitus with hypoglycemia without coma: Secondary | ICD-10-CM | POA: Diagnosis not present

## 2015-09-12 DIAGNOSIS — D631 Anemia in chronic kidney disease: Secondary | ICD-10-CM | POA: Diagnosis not present

## 2015-09-12 NOTE — Telephone Encounter (Signed)
Spoke with VM. He states pt can have Levaquin 500mg  daily x 7 days and pt can call if no better by the weekend. appt can be cancelled per VM if pt wants. Pt states his kidney doctor has called in Levaquin for him already. Pt states to cancel appt and he will call back if no better. Nothing further needed.

## 2015-09-12 NOTE — Telephone Encounter (Signed)
appt scheduled for pt per DS. Pt informed. Nothing further needed.

## 2015-09-13 ENCOUNTER — Ambulatory Visit: Payer: BC Managed Care – PPO | Admitting: Internal Medicine

## 2015-09-13 DIAGNOSIS — D509 Iron deficiency anemia, unspecified: Secondary | ICD-10-CM | POA: Diagnosis not present

## 2015-09-13 DIAGNOSIS — N186 End stage renal disease: Secondary | ICD-10-CM | POA: Diagnosis not present

## 2015-09-13 DIAGNOSIS — D631 Anemia in chronic kidney disease: Secondary | ICD-10-CM | POA: Diagnosis not present

## 2015-09-13 DIAGNOSIS — N2581 Secondary hyperparathyroidism of renal origin: Secondary | ICD-10-CM | POA: Diagnosis not present

## 2015-09-13 DIAGNOSIS — E8779 Other fluid overload: Secondary | ICD-10-CM | POA: Diagnosis not present

## 2015-09-13 DIAGNOSIS — E8770 Fluid overload, unspecified: Secondary | ICD-10-CM | POA: Diagnosis not present

## 2015-09-13 DIAGNOSIS — Z992 Dependence on renal dialysis: Secondary | ICD-10-CM | POA: Diagnosis not present

## 2015-09-14 DIAGNOSIS — D509 Iron deficiency anemia, unspecified: Secondary | ICD-10-CM | POA: Diagnosis not present

## 2015-09-14 DIAGNOSIS — N186 End stage renal disease: Secondary | ICD-10-CM | POA: Diagnosis not present

## 2015-09-14 DIAGNOSIS — E8779 Other fluid overload: Secondary | ICD-10-CM | POA: Diagnosis not present

## 2015-09-14 DIAGNOSIS — N2581 Secondary hyperparathyroidism of renal origin: Secondary | ICD-10-CM | POA: Diagnosis not present

## 2015-09-14 DIAGNOSIS — Z992 Dependence on renal dialysis: Secondary | ICD-10-CM | POA: Diagnosis not present

## 2015-09-14 DIAGNOSIS — D631 Anemia in chronic kidney disease: Secondary | ICD-10-CM | POA: Diagnosis not present

## 2015-09-16 ENCOUNTER — Emergency Department
Admission: EM | Admit: 2015-09-16 | Discharge: 2015-09-16 | Disposition: A | Discharge status: Home / Self Care | Payer: Medicare Other | Attending: Emergency Medicine | Admitting: Emergency Medicine

## 2015-09-16 ENCOUNTER — Encounter: Payer: Self-pay | Admitting: Emergency Medicine

## 2015-09-16 ENCOUNTER — Emergency Department: Payer: Medicare Other

## 2015-09-16 DIAGNOSIS — I120 Hypertensive chronic kidney disease with stage 5 chronic kidney disease or end stage renal disease: Secondary | ICD-10-CM | POA: Insufficient documentation

## 2015-09-16 DIAGNOSIS — N185 Chronic kidney disease, stage 5: Secondary | ICD-10-CM | POA: Diagnosis not present

## 2015-09-16 DIAGNOSIS — R062 Wheezing: Secondary | ICD-10-CM | POA: Diagnosis present

## 2015-09-16 DIAGNOSIS — R05 Cough: Secondary | ICD-10-CM | POA: Diagnosis not present

## 2015-09-16 DIAGNOSIS — Z7952 Long term (current) use of systemic steroids: Secondary | ICD-10-CM | POA: Insufficient documentation

## 2015-09-16 DIAGNOSIS — E109 Type 1 diabetes mellitus without complications: Secondary | ICD-10-CM | POA: Insufficient documentation

## 2015-09-16 DIAGNOSIS — Z7982 Long term (current) use of aspirin: Secondary | ICD-10-CM | POA: Diagnosis not present

## 2015-09-16 DIAGNOSIS — Z794 Long term (current) use of insulin: Secondary | ICD-10-CM | POA: Insufficient documentation

## 2015-09-16 DIAGNOSIS — J189 Pneumonia, unspecified organism: Secondary | ICD-10-CM | POA: Insufficient documentation

## 2015-09-16 DIAGNOSIS — Z79899 Other long term (current) drug therapy: Secondary | ICD-10-CM | POA: Insufficient documentation

## 2015-09-16 LAB — TROPONIN I: Troponin I: 0.08 ng/mL — ABNORMAL HIGH (ref <0.031)

## 2015-09-16 LAB — CBC WITH DIFFERENTIAL/PLATELET
BASOS PCT: 0 %
Basophils Absolute: 0 10*3/uL (ref 0–0.1)
Eosinophils Absolute: 0 10*3/uL (ref 0–0.7)
Eosinophils Relative: 0 %
HEMATOCRIT: 34.9 % — AB (ref 40.0–52.0)
Hemoglobin: 11.4 g/dL — ABNORMAL LOW (ref 13.0–18.0)
LYMPHS PCT: 10 %
Lymphs Abs: 1 10*3/uL (ref 1.0–3.6)
MCH: 31.9 pg (ref 26.0–34.0)
MCHC: 32.5 g/dL (ref 32.0–36.0)
MCV: 98.2 fL (ref 80.0–100.0)
MONO ABS: 0.3 10*3/uL (ref 0.2–1.0)
MONOS PCT: 3 %
NEUTROS ABS: 9.4 10*3/uL — AB (ref 1.4–6.5)
Neutrophils Relative %: 87 %
Platelets: 274 10*3/uL (ref 150–440)
RBC: 3.55 MIL/uL — ABNORMAL LOW (ref 4.40–5.90)
RDW: 16.5 % — AB (ref 11.5–14.5)
WBC: 10.7 10*3/uL — ABNORMAL HIGH (ref 3.8–10.6)

## 2015-09-16 LAB — BASIC METABOLIC PANEL
Anion gap: 14 (ref 5–15)
BUN: 69 mg/dL — AB (ref 6–20)
CHLORIDE: 96 mmol/L — AB (ref 101–111)
CO2: 26 mmol/L (ref 22–32)
CREATININE: 10.65 mg/dL — AB (ref 0.61–1.24)
Calcium: 8 mg/dL — ABNORMAL LOW (ref 8.9–10.3)
GFR calc Af Amer: 6 mL/min — ABNORMAL LOW (ref >60)
GFR calc non Af Amer: 5 mL/min — ABNORMAL LOW (ref >60)
Glucose, Bld: 470 mg/dL — ABNORMAL HIGH (ref 65–99)
Potassium: 5.6 mmol/L — ABNORMAL HIGH (ref 3.5–5.1)
Sodium: 136 mmol/L (ref 135–145)

## 2015-09-16 LAB — BRAIN NATRIURETIC PEPTIDE: B Natriuretic Peptide: 124 pg/mL — ABNORMAL HIGH (ref 0.0–100.0)

## 2015-09-16 MED ORDER — IPRATROPIUM-ALBUTEROL 0.5-2.5 (3) MG/3ML IN SOLN
RESPIRATORY_TRACT | Status: AC
  Administered 2015-09-16: 3 mL via RESPIRATORY_TRACT
  Filled 2015-09-16: qty 3

## 2015-09-16 MED ORDER — IPRATROPIUM-ALBUTEROL 0.5-2.5 (3) MG/3ML IN SOLN
3.0000 mL | Freq: Once | RESPIRATORY_TRACT | Status: AC
  Administered 2015-09-16: 3 mL via RESPIRATORY_TRACT

## 2015-09-16 MED ORDER — ALBUTEROL SULFATE HFA 108 (90 BASE) MCG/ACT IN AERS: 2.0000 | INHALATION_SPRAY | RESPIRATORY_TRACT | Status: DC | PRN

## 2015-09-16 NOTE — ED Notes (Signed)
Pt presents to ER with SOB that has worsened since yesterday but "it has been going on for about 1 week". Pt states wheezing when he breathes. Pt has chronic nasal cannula 3 L. Pt has been on levaquin that started Monday because he presented to the doctor's office with a low grade fever and wheezes.

## 2015-09-16 NOTE — ED Provider Notes (Signed)
[facility] Emergency Department Provider Note  ____________________________________________  Time seen: 7:25 PM  I have reviewed the triage vital signs and the nursing notes.   HISTORY  Chief Complaint Wheezing; Shortness of Breath; and Pneumonia    HPI Ryan Arnold is a 54 y.o. male who complains of shortness of breath for about one week has been gradually worsening. He reports he feels that he is wheezing. He has a persistent productive cough. He has a history of BOOP.  No fevers or chills, no chest pain. He uses 3 L nasal cannula at home without changes in this. He is at his baseline regarding chronic dyspnea on exertion, no new orthopnea.  He was started on Levaquin at the beginning of the week and is taking it every other day due to his end-stage renal disease, after his dialysis sessions. He is compliant with dialysis and has had it over the last week at Monday Wednesday Friday. He is due for it again tomorrow.     Past Medical History  Diagnosis Date  . Essential hypertension   . Hyperlipemia   . End stage renal disease (HCC)     a. on dialysis; still makes urine.  [name] BOOP (bronchiolitis obliterans with organizing pneumonia) (HCC)     a. 01/2018 s/p R thoracoscopy/Bx confirming BOOP;  b. 02/2018 high dose steroids started.  . Chronic diastolic CHF (congestive heart failure) (HCC)     a. 12/2014 Echo: EF 50-55%, mild conc LVH, mildly dil LA.  . Morbid obesity (HCC)   . Secondary hyperparathyroidism (HCC)   . Anemia of chronic disease   . Recurrent pneumonia     a. 2/2 BOOP.  [name] Chest pain     a. 06/2014 Myoview ([facility]) no ischemia/infarct, nl EF.  . Type 1 diabetes mellitus (HCC)   . Thyroid disease   . OSA treated with BiPAP      Patient Active Problem List   Diagnosis Date Noted  . Diarrhea 08/22/2015  . Impotence 06/05/2015  . Trouble swallowing 06/05/2015  . Preventative health care 06/05/2015  . PVC's (premature ventricular  contractions) 04/23/2015  . Anemia of chronic disease   . Secondary hyperparathyroidism (HCC)   . Morbid obesity (HCC)   . Chronic diastolic CHF (congestive heart failure) (HCC)   . Essential hypertension   . 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 Proliferative retinopathy 03/15/2015  . CKD (chronic kidney disease) stage V requiring chronic dialysis (HCC) 03/15/2015  . Hyperlipidemia 03/15/2015  . BOOP (bronchiolitis obliterans with organizing pneumonia) (HCC) 03/01/2015  . OSA (obstructive sleep apnea) 01/18/2015  . Recurrent pneumonia 01/15/2015     Past Surgical History  Procedure Laterality Date  . Cataract Bilateral   . Shoulder surgery Left   . Knee surgery Right   . Gallbladder surgery    . Lung biopsy       Current Outpatient Rx  Name  Route  Sig  Dispense  Refill  . albuterol (PROVENTIL HFA) 108 (90 BASE) MCG/ACT inhaler   Inhalation   Inhale 2 puffs into the lungs every 4 (four) hours as needed for wheezing or shortness of breath.   1 Inhaler   0   . allopurinol (ZYLOPRIM) 100 MG tablet   Oral   Take 1 tablet (100 mg total) by mouth daily.   90 tablet   1   . amLODipine (NORVASC) 10 MG tablet      take 1 tablet by mouth once daily  30 tablet   2   . aspirin 81 MG chewable tablet   Oral   Chew 1 tablet (81 mg total) by mouth daily.   30 tablet   2   . atorvastatin (LIPITOR) 40 MG tablet   Oral   Take 1 tablet (40 mg total) by mouth daily. Patient taking differently: Take 20 mg by mouth at bedtime.    90 tablet   3   . B-D ULTRAFINE III SHORT PEN 31G X 8 MM MISC      use as directed   100 each   11     Phone number to our clinic (as requested) is [phone] ...   . clonazePAM (KLONOPIN) 0.5 MG tablet   Oral   Take 0.5 mg by mouth at bedtime.         . DULoxetine (CYMBALTA) 60 MG capsule   Oral   Take 120 mg by mouth daily.      0   . furosemide (LASIX) 80 MG tablet   Oral   Take 1 tablet (80 mg  total) by mouth 4 (four) times a week. Tuesday, Thursday, Saturday, and Sunday.   36 tablet   3   . gabapentin (NEURONTIN) 100 MG capsule   Oral   Take 100 mg by mouth daily.      1   . hydrALAZINE (APRESOLINE) 25 MG tablet   Oral   Take 1 tablet (25 mg total) by mouth 3 (three) times daily.   90 tablet   1   . insulin aspart (NOVOLOG) 100 UNIT/ML FlexPen   Subcutaneous   Inject 22 Units into the skin 3 (three) times daily with meals.   15 mL   2   . LANTUS SOLOSTAR 100 UNIT/ML Solostar Pen      Inject 36 units at breakfast and 8 units at bedtime Patient taking differently: Inject 41 units at breakfast and 8 units at bedtime   15 mL   1     Dispense as written.    Dose Change   . metoprolol (LOPRESSOR) 50 MG tablet   Oral   Take 50 mg by mouth 2 (two) times daily.         . multivitamin (RENA-VIT) TABS tablet   Oral   Take 1 tablet by mouth daily.         . pantoprazole (PROTONIX) 40 MG tablet      take 1 tablet by mouth once daily   30 tablet   0   . predniSONE (DELTASONE) 20 MG tablet   Oral   Take 1.5 tablets (30 mg total) by mouth daily with breakfast. Takes 1 1/2 tablets daily   90 tablet   0   . predniSONE (DELTASONE) 5 MG tablet      20 mg daily for 1 month then 15mg  daily for 1 month   220 tablet   0   . SENSIPAR 30 MG tablet   Oral   Take 30 mg by mouth at bedtime.            Dispense as written.   . sevelamer carbonate (RENVELA) 800 MG tablet   Oral   Take 800 mg by mouth 3 (three) times daily. And 2 tablet with snacks         . valsartan (DIOVAN) 80 MG tablet   Oral   Take 80 mg by mouth at bedtime.      0      Allergies Oxycodone-acetaminophen  Family History  Problem Relation Age of Onset  . Diabetes Mother     died @ 47.  [name] Hypertension Mother   . Hypertension Maternal Grandmother     Social History Social History  Substance Use Topics  . Smoking status: Never Smoker   . Smokeless tobacco: Never Used   . Alcohol Use: No    Review of Systems  Constitutional:   No fever or chills. No weight changes Eyes:   No blurry vision or double vision.  ENT:   No sore throat. Cardiovascular:   No chest pain. Respiratory:   Acute on chronic dyspnea with productive cough. Gastrointestinal:   Negative for abdominal pain, vomiting and diarrhea.  No BRBPR or melena. Genitourinary:   Negative for dysuria, urinary retention, bloody urine, or difficulty urinating. Musculoskeletal:   Negative for back pain. No joint swelling or pain. Skin:   Negative for rash. Neurological:   Negative for headaches, focal weakness or numbness. Psychiatric:  No anxiety or depression.   Endocrine:  No hot/cold intolerance, changes in energy, or sleep difficulty.  10-point ROS otherwise negative.  ____________________________________________   PHYSICAL EXAM:  VITAL SIGNS: ED Triage Vitals  Enc Vitals Group     BP 09/16/15 1754 147/73 mmHg     Pulse Rate 09/16/15 1754 88     Resp 09/16/15 1754 20     Temp 09/16/15 1754 97.5 F (36.4 C)     Temp Source 09/16/15 1754 Oral     SpO2 09/16/15 1754 98 %     Weight 09/16/15 1754 299 lb (135.626 kg)     Height 09/16/15 1754 6\' 1"  (1.854 m)     Head Cir --      Peak Flow --      Pain Score 09/16/15 1759 0     Pain Loc --      Pain Edu? --      Excl. in GC? --      Constitutional:   Alert and oriented. Well appearing and in no distress. Eyes:   No scleral icterus. No conjunctival pallor. PERRL. EOMI ENT   Head:   Normocephalic and atraumatic.   Nose:   No congestion/rhinnorhea. No septal hematoma   Mouth/Throat:   MMM, no pharyngeal erythema. No peritonsillar mass. No uvula shift.   Neck:   No stridor. No SubQ emphysema. No meningismus. Hematological/Lymphatic/Immunilogical:   No cervical lymphadenopathy. Cardiovascular:   RRR. Normal and symmetric distal pulses are present in all extremities. No murmurs, rubs, or gallops. Respiratory:   Normal  respiratory effort without tachypnea nor retractions. Breath sounds are symmetric with slight end expiratory wheezes.  Gastrointestinal:   Soft and nontender. No distention. There is no CVA tenderness.  No rebound, rigidity, or guarding. Genitourinary:   deferred Musculoskeletal:   Nontender with normal range of motion in all extremities. No joint effusions.  No lower extremity tenderness.  No edema. Neurologic:   Normal speech and language.  CN 2-10 normal. Motor grossly intact. No pronator drift.  Normal gait. No gross focal neurologic deficits are appreciated.  Skin:    Skin is warm, dry and intact. No rash noted.  No petechiae, purpura, or bullae. Psychiatric:   Mood and affect are normal. Speech and behavior are normal. Patient exhibits appropriate insight and judgment.  ____________________________________________    LABS (pertinent positives/negatives) (all labs ordered are listed, but only abnormal results are displayed) Labs Reviewed  CBC WITH DIFFERENTIAL/PLATELET - Abnormal; Notable for the following:    WBC 10.7 (*)  RBC 3.55 (*)    Hemoglobin 11.4 (*)    HCT 34.9 (*)    RDW 16.5 (*)    Neutro Abs 9.4 (*)    All other components within normal limits  BRAIN NATRIURETIC PEPTIDE - Abnormal; Notable for the following:    B Natriuretic Peptide 124.0 (*)    All other components within normal limits  BASIC METABOLIC PANEL - Abnormal; Notable for the following:    Potassium 5.6 (*)    Chloride 96 (*)    Glucose, Bld 470 (*)    BUN 69 (*)    Creatinine, Ser 10.65 (*)    Calcium 8.0 (*)    GFR calc non Af Amer 5 (*)    GFR calc Af Amer 6 (*)    All other components within normal limits  TROPONIN I - Abnormal; Notable for the following:    Troponin I 0.08 (*)    All other components within normal limits  CULTURE, BLOOD (ROUTINE X 2)  CULTURE, BLOOD (ROUTINE X 2)   ____________________________________________   EKG  Normal sinus rhythm rate of 87, left axis,  normal intervals. Normal QRS and ST segments and T waves. There are voltage criteria for LVH in the high lateral leads  ____________________________________________    RADIOLOGY  Chest x-ray unremarkable  ____________________________________________   PROCEDURES   ____________________________________________   INITIAL IMPRESSION / ASSESSMENT AND PLAN / ED COURSE  Pertinent labs & imaging results that were available during my care of the patient were reviewed by me and considered in my medical decision making (see chart for details).  Patient presents with acute on chronic shortness of breath. He is already on Levaquin. Low suspicion for age. X-ray essentially unremarkable with some scarring in the right lung. Labs unremarkable. Vital signs good, oxygen saturation 100% on his baseline 3 L nasal cannula.  We'll give DuoNeb here and discharge with albuterol inhaler. The patient is already on a long steroid taper which she should continue. Continue Levaquin, continue dialysis as scheduled. No indication for emergent dialysis. Low suspicion for CHF ACS or volume overload.     ____________________________________________   FINAL CLINICAL IMPRESSION(S) / ED DIAGNOSES  Final diagnoses:  Recurrent pneumonia      Sharman Cheek, MD 09/16/15 2030

## 2015-09-16 NOTE — ED Notes (Signed)
Pt to Xray then will be brought to room by Xray tech.

## 2015-09-16 NOTE — ED Notes (Signed)
Pt uprite on stretcher in exam room with no distress noted; O2 in place at 3l/mn via [place] as used at home; pt reports SOB that has worsened since yesterday but "it has been going on for about 1 week". Pt states wheezing when he breathes.  Pt has been on levaquin that started Monday because he presented to the doctor's office with a low grade fever and wheezes; resp even/unlab, lungs clear to auscultation at present

## 2015-09-17 DIAGNOSIS — Z992 Dependence on renal dialysis: Secondary | ICD-10-CM | POA: Diagnosis not present

## 2015-09-17 DIAGNOSIS — D631 Anemia in chronic kidney disease: Secondary | ICD-10-CM | POA: Diagnosis not present

## 2015-09-17 DIAGNOSIS — N2581 Secondary hyperparathyroidism of renal origin: Secondary | ICD-10-CM | POA: Diagnosis not present

## 2015-09-17 DIAGNOSIS — D509 Iron deficiency anemia, unspecified: Secondary | ICD-10-CM | POA: Diagnosis not present

## 2015-09-17 DIAGNOSIS — E8779 Other fluid overload: Secondary | ICD-10-CM | POA: Diagnosis not present

## 2015-09-17 DIAGNOSIS — N186 End stage renal disease: Secondary | ICD-10-CM | POA: Diagnosis not present

## 2015-09-18 DIAGNOSIS — N189 Chronic kidney disease, unspecified: Secondary | ICD-10-CM | POA: Diagnosis not present

## 2015-09-18 DIAGNOSIS — E119 Type 2 diabetes mellitus without complications: Secondary | ICD-10-CM | POA: Diagnosis not present

## 2015-09-18 DIAGNOSIS — Z992 Dependence on renal dialysis: Secondary | ICD-10-CM | POA: Diagnosis not present

## 2015-09-18 DIAGNOSIS — F5221 Male erectile disorder: Secondary | ICD-10-CM | POA: Diagnosis not present

## 2015-09-18 DIAGNOSIS — N185 Chronic kidney disease, stage 5: Secondary | ICD-10-CM | POA: Diagnosis not present

## 2015-09-19 DIAGNOSIS — N186 End stage renal disease: Secondary | ICD-10-CM | POA: Diagnosis not present

## 2015-09-19 DIAGNOSIS — N2581 Secondary hyperparathyroidism of renal origin: Secondary | ICD-10-CM | POA: Diagnosis not present

## 2015-09-19 DIAGNOSIS — E8779 Other fluid overload: Secondary | ICD-10-CM | POA: Diagnosis not present

## 2015-09-19 DIAGNOSIS — D509 Iron deficiency anemia, unspecified: Secondary | ICD-10-CM | POA: Diagnosis not present

## 2015-09-19 DIAGNOSIS — D631 Anemia in chronic kidney disease: Secondary | ICD-10-CM | POA: Diagnosis not present

## 2015-09-19 DIAGNOSIS — Z992 Dependence on renal dialysis: Secondary | ICD-10-CM | POA: Diagnosis not present

## 2015-09-21 DIAGNOSIS — N186 End stage renal disease: Secondary | ICD-10-CM | POA: Diagnosis not present

## 2015-09-21 DIAGNOSIS — E8779 Other fluid overload: Secondary | ICD-10-CM | POA: Diagnosis not present

## 2015-09-21 DIAGNOSIS — D631 Anemia in chronic kidney disease: Secondary | ICD-10-CM | POA: Diagnosis not present

## 2015-09-21 DIAGNOSIS — N2581 Secondary hyperparathyroidism of renal origin: Secondary | ICD-10-CM | POA: Diagnosis not present

## 2015-09-21 DIAGNOSIS — Z992 Dependence on renal dialysis: Secondary | ICD-10-CM | POA: Diagnosis not present

## 2015-09-21 DIAGNOSIS — D509 Iron deficiency anemia, unspecified: Secondary | ICD-10-CM | POA: Diagnosis not present

## 2015-09-21 LAB — CULTURE, BLOOD (ROUTINE X 2): Culture: NO GROWTH

## 2015-09-24 ENCOUNTER — Other Ambulatory Visit: Payer: Self-pay | Admitting: Endocrinology

## 2015-09-24 DIAGNOSIS — D509 Iron deficiency anemia, unspecified: Secondary | ICD-10-CM | POA: Diagnosis not present

## 2015-09-24 DIAGNOSIS — D631 Anemia in chronic kidney disease: Secondary | ICD-10-CM | POA: Diagnosis not present

## 2015-09-24 DIAGNOSIS — N186 End stage renal disease: Secondary | ICD-10-CM | POA: Diagnosis not present

## 2015-09-24 DIAGNOSIS — Z992 Dependence on renal dialysis: Secondary | ICD-10-CM | POA: Diagnosis not present

## 2015-09-24 DIAGNOSIS — N2581 Secondary hyperparathyroidism of renal origin: Secondary | ICD-10-CM | POA: Diagnosis not present

## 2015-09-24 DIAGNOSIS — E8779 Other fluid overload: Secondary | ICD-10-CM | POA: Diagnosis not present

## 2015-09-26 DIAGNOSIS — D509 Iron deficiency anemia, unspecified: Secondary | ICD-10-CM | POA: Diagnosis not present

## 2015-09-26 DIAGNOSIS — N186 End stage renal disease: Secondary | ICD-10-CM | POA: Diagnosis not present

## 2015-09-26 DIAGNOSIS — N2581 Secondary hyperparathyroidism of renal origin: Secondary | ICD-10-CM | POA: Diagnosis not present

## 2015-09-26 DIAGNOSIS — D631 Anemia in chronic kidney disease: Secondary | ICD-10-CM | POA: Diagnosis not present

## 2015-09-26 DIAGNOSIS — E8779 Other fluid overload: Secondary | ICD-10-CM | POA: Diagnosis not present

## 2015-09-26 DIAGNOSIS — Z992 Dependence on renal dialysis: Secondary | ICD-10-CM | POA: Diagnosis not present

## 2015-09-27 DIAGNOSIS — D631 Anemia in chronic kidney disease: Secondary | ICD-10-CM | POA: Diagnosis not present

## 2015-09-27 DIAGNOSIS — N186 End stage renal disease: Secondary | ICD-10-CM | POA: Diagnosis not present

## 2015-09-27 DIAGNOSIS — N2581 Secondary hyperparathyroidism of renal origin: Secondary | ICD-10-CM | POA: Diagnosis not present

## 2015-09-27 DIAGNOSIS — D509 Iron deficiency anemia, unspecified: Secondary | ICD-10-CM | POA: Diagnosis not present

## 2015-09-27 DIAGNOSIS — Z992 Dependence on renal dialysis: Secondary | ICD-10-CM | POA: Diagnosis not present

## 2015-09-27 DIAGNOSIS — E8779 Other fluid overload: Secondary | ICD-10-CM | POA: Diagnosis not present

## 2015-09-28 ENCOUNTER — Ambulatory Visit (INDEPENDENT_AMBULATORY_CARE_PROVIDER_SITE_OTHER): Payer: Medicare Other | Admitting: Pulmonary Disease

## 2015-09-28 ENCOUNTER — Other Ambulatory Visit: Payer: Self-pay | Admitting: *Deleted

## 2015-09-28 ENCOUNTER — Ambulatory Visit
Admission: RE | Admit: 2015-09-28 | Discharge: 2015-09-28 | Disposition: A | Discharge status: Home / Self Care | Payer: Medicare Other | Source: Ambulatory Visit | Attending: Internal Medicine | Admitting: Internal Medicine

## 2015-09-28 ENCOUNTER — Encounter: Payer: Self-pay | Admitting: Pulmonary Disease

## 2015-09-28 VITALS — BP 128/58 | HR 98 | Ht 73.0 in | Wt 299.0 lb

## 2015-09-28 DIAGNOSIS — D631 Anemia in chronic kidney disease: Secondary | ICD-10-CM | POA: Diagnosis not present

## 2015-09-28 DIAGNOSIS — J209 Acute bronchitis, unspecified: Secondary | ICD-10-CM | POA: Insufficient documentation

## 2015-09-28 DIAGNOSIS — R918 Other nonspecific abnormal finding of lung field: Secondary | ICD-10-CM | POA: Insufficient documentation

## 2015-09-28 DIAGNOSIS — R0602 Shortness of breath: Secondary | ICD-10-CM | POA: Diagnosis not present

## 2015-09-28 DIAGNOSIS — R0600 Dyspnea, unspecified: Secondary | ICD-10-CM | POA: Diagnosis not present

## 2015-09-28 DIAGNOSIS — D509 Iron deficiency anemia, unspecified: Secondary | ICD-10-CM | POA: Diagnosis not present

## 2015-09-28 DIAGNOSIS — E8779 Other fluid overload: Secondary | ICD-10-CM | POA: Diagnosis not present

## 2015-09-28 DIAGNOSIS — J8489 Other specified interstitial pulmonary diseases: Secondary | ICD-10-CM | POA: Diagnosis not present

## 2015-09-28 DIAGNOSIS — Z992 Dependence on renal dialysis: Secondary | ICD-10-CM | POA: Diagnosis not present

## 2015-09-28 DIAGNOSIS — N2581 Secondary hyperparathyroidism of renal origin: Secondary | ICD-10-CM | POA: Diagnosis not present

## 2015-09-28 DIAGNOSIS — N186 End stage renal disease: Secondary | ICD-10-CM | POA: Diagnosis not present

## 2015-09-28 MED ORDER — PREDNISONE 10 MG (21) PO TBPK: ORAL_TABLET | Freq: Every day | ORAL | Status: DC

## 2015-09-28 MED ORDER — LEVOFLOXACIN 500 MG PO TABS: 500.0000 mg | ORAL_TABLET | Freq: Every day | ORAL | Status: AC

## 2015-09-28 NOTE — Addendum Note (Signed)
Addended by: Maxwell MarionBLANKENSHIP, Laury Huizar A on: 09/28/2015 02:20 PM   Modules accepted: Orders

## 2015-09-28 NOTE — Progress Notes (Signed)
MRN# [id number] Ryan Arnold [birth year]   CC: Chief Complaint  Patient presents with  . Acute Visit    wheezing; SOB worse; chest tightness; ER 2 weeks ago w/bronchitis;   Synopsis: 54 year old male past medical history of end-stage renal disease on dialysis, uncontrolled diabetes, hypertension, former retired Emergency planning/management officer, seen in consultation for recurrent pneumonias. Multiple hospitalizations at [facility] and at [facility] over the past one year for bilateral groundglass opacities throughout entire lung fields requiring multiple rounds of antibiotics and steroids. Had a bronchoscopy done at [facility] and within the last 2 years no acute findings from the BAL. Patient with recent bronchoscopy, March 2016, at [facility] by Dr. Dema Severin, BAL with no acute findings. April 2016 surgical lung bx (wedge) by Dr. Thelma Barge, path confirms BOOP, started on high dose steroids.   Events since last clinic visit: Patient presents today for a BOOP acute care visit.  He was seen on 09/13/15 at the ED for sob and productive sputum, given 1 week of levaquin for a presumptive dx of bronchitis.  Over the last 4 days his symptoms have returned, he endorsed SOB, DOE, sputum production (greenish), fatigue, cough. He was seen during HD this morning and was noted to be dyspneic after HD, his nephrologist called Pulmonary requesting an acute care visit for further evaluation of SOB and possible reinitiating antibiotics.     Medication:   Current Outpatient Rx  Name  Route  Sig  Dispense  Refill  . albuterol (PROVENTIL HFA) 108 (90 BASE) MCG/ACT inhaler   Inhalation   Inhale 2 puffs into the lungs every 4 (four) hours as needed for wheezing or shortness of breath.   1 Inhaler   0   . allopurinol (ZYLOPRIM) 100 MG tablet   Oral   Take 1 tablet (100 mg total) by mouth daily.   90 tablet   1   . amLODipine (NORVASC) 10 MG tablet      take 1 tablet by mouth once daily   30 tablet   2   . aspirin 81 MG chewable tablet   Oral  Chew 1 tablet (81 mg total) by mouth daily.   30 tablet   2   . atorvastatin (LIPITOR) 40 MG tablet   Oral   Take 1 tablet (40 mg total) by mouth daily. Patient taking differently: Take 20 mg by mouth at bedtime.    90 tablet   3   . B-D ULTRAFINE III SHORT PEN 31G X 8 MM MISC      use as directed   100 each   11     Phone number to our clinic (as requested) is [phone] ...   . clonazePAM (KLONOPIN) 0.5 MG tablet   Oral   Take 0.5 mg by mouth at bedtime.         . DULoxetine (CYMBALTA) 60 MG capsule   Oral   Take 120 mg by mouth daily.      0   . furosemide (LASIX) 80 MG tablet   Oral   Take 1 tablet (80 mg total) by mouth 4 (four) times a week. Tuesday, Thursday, Saturday, and Sunday.   36 tablet   3   . gabapentin (NEURONTIN) 100 MG capsule   Oral   Take 100 mg by mouth daily.      1   . hydrALAZINE (APRESOLINE) 25 MG tablet   Oral   Take 1 tablet (25 mg total) by mouth 3 (three) times daily.   90 tablet  1   . insulin aspart (NOVOLOG) 100 UNIT/ML FlexPen   Subcutaneous   Inject 22 Units into the skin 3 (three) times daily with meals.   15 mL   2   . LANTUS SOLOSTAR 100 UNIT/ML Solostar Pen      INJECT 36 UNITS AT BREAKFAST AND 8 UNITS AT BEDTIME   15 mL   3     Dispense as written.    Dose Change   . metoprolol (LOPRESSOR) 50 MG tablet   Oral   Take 50 mg by mouth 2 (two) times daily.         . multivitamin (RENA-VIT) TABS tablet   Oral   Take 1 tablet by mouth daily.         . pantoprazole (PROTONIX) 40 MG tablet      take 1 tablet by mouth once daily   30 tablet   0   . predniSONE (DELTASONE) 5 MG tablet      20mg  daily for 1 month then 15mg  daily for 1 month   220 tablet   0   . SENSIPAR 30 MG tablet   Oral   Take 30 mg by mouth at bedtime.            Dispense as written.   . sevelamer carbonate (RENVELA) 800 MG tablet   Oral   Take 800 mg by mouth 3 (three) times daily. And 2 tablet with snacks         .  valsartan (DIOVAN) 80 MG tablet   Oral   Take 80 mg by mouth at bedtime.      0      Review of Systems: Gen:  Denies  fever, sweats, chills HEENT: Denies blurred vision, double vision, ear pain, eye pain, hearing loss, nose bleeds, sore throat Cvc:  No dizziness, chest pain or heaviness Resp:   Admits ZO:XWRUEAVto:dyspnea, cough, productive sputum, wheezing Gi: Denies swallowing difficulty, stomach pain, nausea or vomiting, diarrhea, constipation, bowel incontinence Gu:  Denies bladder incontinence, burning urine Ext:   No Joint pain, stiffness or swelling Skin: No skin rash, easy bruising or bleeding or hives Endoc:  No polyuria, polydipsia , polyphagia or weight change Other:  All other systems negative  Allergies:  Oxycodone-acetaminophen  Physical Examination:  VS: BP 128/58 mmHg  Pulse 98  Ht 6\' 1"  (1.854 m)  Wt 299 lb (135.626 kg)  BMI 39.46 kg/m2  SpO2 96%  General Appearance: No distress  HEENT: PERRLA, no ptosis, no other lesions noticed Pulmonary: Good respiratory effort, mild decreased breath sounds at the bases(this is baseline), no wheezes, no crackles Cardiovascular:  Normal S1,S2.  No m/r/g.     Abdomen:Exam: Benign, Soft, non-tender, No masses  Skin:   warm, no ecchymosis, fine milia appearance lesions on the face and forehead (no erythema, no eruption) Extremities: normal, no cyanosis, clubbing, warm with normal capillary refill.      Rad results: (The following images and results were reviewed by Dr. Dema SeverinMungal on 09/28/2015). CXR 09/28/15 EXAM: CHEST 2 VIEW  COMPARISON: 09/16/2015  FINDINGS: Patchy right lung airspace disease again noted, unchanged. No focal opacity on the left. Heart is borderline in size. No effusions or acute bony abnormality.  IMPRESSION: Patchy right lung opacities are stable. Borderline heart size.   CXR 08/14/15 CHEST 2 VIEW  COMPARISON: 03/19/2015  FINDINGS: Mild cardiomegaly. Blunting of the right costophrenic angle  likely reflects scarring/ pleural thickening. No confluent airspace opacities, effusions or edema. No acute bony abnormality.  IMPRESSION: Cardiomegaly. No active disease.   CXR 03/19/15 CHEST 2 VIEW  COMPARISON: 03/18/2015  FINDINGS: Progression of bilateral airspace disease left greater than right. Cardiac enlargement. Right pleural effusion is small and unchanged. No significant effusion on the left. No pneumothorax.  IMPRESSION: Progression of bilateral airspace disease left greater than right. Differential includes pulmonary infection and pulmonary edema. Pulmonary hemorrhage also a possibility.    Assessment and Plan:54 yo male seen in follow up for BOOP. Patient doing well on current dose. No exacerbations of SOB, no signs of infection at this time. Recommend slow taper as previously discussed with pateint   Acute bronchitis Most likely a relapse or recent infection 2 weeks ago. Probably viral now with subacute bacterial. Completed a course of levaquin 2 weeks ago. CXR today stable compared to 1 month ago, no new infiltrates.    Plan - Levaquin , 1 tab po x 10 days - albuterol inhaler - 2puff every 6 hours for 3 days, then 2puff as needed every 3-4 hours as needed for shortness of breath\wheezing\recurrent cough - incentive spirometry 10-15 per day.     BOOP (bronchiolitis obliterans with organizing pneumonia) BOOP - surgical biopsy proven Unknown trigger. Today we will have to increase his prednisone dose back to , he was wean down to , but has had 2 subsequent URIs requiring antibiotics.  He is advised to increase to 30 mg of prednisone for 2 weeks, then go down to  until follow up, if his symptoms return after wean then go back to  prednisone until follow up.   Plan: - continue with Prednisone  (1.5pills) x 2 weeks, then  until follow up. Patient is advised if symptoms return, and is persistent for 3-4 days, then increase  prednisone to 30 mg daily until follow-up visit - Anticipate steroid treatment for 6-9 months (total). If there is another relapse of BOOP, will have to consider another bronchoscopy to rule out atypical infections exacerbating BOOP. - continue with 3L O2 with exertion and with sleep

## 2015-09-28 NOTE — Patient Instructions (Addendum)
Follow up with Dr. Dema SeverinMungal on 10/30/2015 - continue with Prednisone 30mg  (1.5pills) x 2 weeks, then 20mg  until follow up. Patient is advised if symptoms return, and is persistent for 3-4 days, then increase prednisone to 30 mg daily until follow-up visit - Levaquin 500mg , 1 tab po x 10 days - albuterol inhaler - 2puff every 6 hours for 3 days, then 2puff as needed every 3-4 hours as needed for shortness of breath\wheezing\recurrent cough - incentive spirometry 10-15 times per day.

## 2015-09-28 NOTE — Assessment & Plan Note (Signed)
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 Today we will have to increase his prednisone dose back to 30mg , he was wean down to 20mg , but has had 2 subsequent URIs requiring antibiotics.  He is advised to increase to 30 mg of prednisone for 2 weeks, then go down to 20mg  until follow up, if his symptoms return after wean then go back to 30mg  prednisone until follow up.   Plan: - continue with Prednisone 30mg  (1.5pills) x 2 weeks, then 20mg  until follow up. Patient is advised if symptoms return, and is persistent for 3-4 days, then increase prednisone to 30 mg daily until follow-up visit - Anticipate steroid treatment for 6-9 months (total). If there is another relapse of BOOP, will have to consider another bronchoscopy to rule out atypical infections exacerbating BOOP. - continue with 3L O2 with exertion and with sleep

## 2015-09-28 NOTE — Assessment & Plan Note (Addendum)
Most likely a relapse or recent infection 2 weeks ago. Probably viral now with subacute bacterial. Completed a course of levaquin 2 weeks ago. CXR today stable compared to 1 month ago, no new infiltrates.    Plan - Levaquin 500mg , 1 tab po x 10 days - albuterol inhaler - 2puff every 6 hours for 3 days, then 2puff as needed every 3-4 hours as needed for shortness of breath\wheezing\recurrent cough - incentive spirometry 10-15 per day.

## 2015-10-01 DIAGNOSIS — N2581 Secondary hyperparathyroidism of renal origin: Secondary | ICD-10-CM | POA: Diagnosis not present

## 2015-10-01 DIAGNOSIS — Z992 Dependence on renal dialysis: Secondary | ICD-10-CM | POA: Diagnosis not present

## 2015-10-01 DIAGNOSIS — E8779 Other fluid overload: Secondary | ICD-10-CM | POA: Diagnosis not present

## 2015-10-01 DIAGNOSIS — N186 End stage renal disease: Secondary | ICD-10-CM | POA: Diagnosis not present

## 2015-10-01 DIAGNOSIS — D509 Iron deficiency anemia, unspecified: Secondary | ICD-10-CM | POA: Diagnosis not present

## 2015-10-01 DIAGNOSIS — D631 Anemia in chronic kidney disease: Secondary | ICD-10-CM | POA: Diagnosis not present

## 2015-10-02 DIAGNOSIS — E291 Testicular hypofunction: Secondary | ICD-10-CM | POA: Diagnosis not present

## 2015-10-02 DIAGNOSIS — D631 Anemia in chronic kidney disease: Secondary | ICD-10-CM | POA: Diagnosis not present

## 2015-10-02 DIAGNOSIS — N2581 Secondary hyperparathyroidism of renal origin: Secondary | ICD-10-CM | POA: Diagnosis not present

## 2015-10-02 DIAGNOSIS — E8779 Other fluid overload: Secondary | ICD-10-CM | POA: Diagnosis not present

## 2015-10-02 DIAGNOSIS — Z125 Encounter for screening for malignant neoplasm of prostate: Secondary | ICD-10-CM | POA: Diagnosis not present

## 2015-10-02 DIAGNOSIS — D509 Iron deficiency anemia, unspecified: Secondary | ICD-10-CM | POA: Diagnosis not present

## 2015-10-02 DIAGNOSIS — Z992 Dependence on renal dialysis: Secondary | ICD-10-CM | POA: Diagnosis not present

## 2015-10-02 DIAGNOSIS — N186 End stage renal disease: Secondary | ICD-10-CM | POA: Diagnosis not present

## 2015-10-02 LAB — CBC AND DIFFERENTIAL
HCT: 34 % — AB (ref 41–53)
Hemoglobin: 10.9 g/dL — AB (ref 13.5–17.5)
PLATELETS: 347 10*3/uL (ref 150–399)
WBC: 12.8 10^3/mL

## 2015-10-02 LAB — TSH: TSH: 1.7 u[IU]/mL (ref 0.41–5.90)

## 2015-10-03 DIAGNOSIS — E8779 Other fluid overload: Secondary | ICD-10-CM | POA: Diagnosis not present

## 2015-10-03 DIAGNOSIS — N186 End stage renal disease: Secondary | ICD-10-CM | POA: Diagnosis not present

## 2015-10-03 DIAGNOSIS — Z992 Dependence on renal dialysis: Secondary | ICD-10-CM | POA: Diagnosis not present

## 2015-10-03 DIAGNOSIS — D631 Anemia in chronic kidney disease: Secondary | ICD-10-CM | POA: Diagnosis not present

## 2015-10-03 DIAGNOSIS — N2581 Secondary hyperparathyroidism of renal origin: Secondary | ICD-10-CM | POA: Diagnosis not present

## 2015-10-03 DIAGNOSIS — D509 Iron deficiency anemia, unspecified: Secondary | ICD-10-CM | POA: Diagnosis not present

## 2015-10-05 DIAGNOSIS — N2581 Secondary hyperparathyroidism of renal origin: Secondary | ICD-10-CM | POA: Diagnosis not present

## 2015-10-05 DIAGNOSIS — Z992 Dependence on renal dialysis: Secondary | ICD-10-CM | POA: Diagnosis not present

## 2015-10-05 DIAGNOSIS — E8779 Other fluid overload: Secondary | ICD-10-CM | POA: Diagnosis not present

## 2015-10-05 DIAGNOSIS — D509 Iron deficiency anemia, unspecified: Secondary | ICD-10-CM | POA: Diagnosis not present

## 2015-10-05 DIAGNOSIS — N186 End stage renal disease: Secondary | ICD-10-CM | POA: Diagnosis not present

## 2015-10-05 DIAGNOSIS — D631 Anemia in chronic kidney disease: Secondary | ICD-10-CM | POA: Diagnosis not present

## 2015-10-08 DIAGNOSIS — D631 Anemia in chronic kidney disease: Secondary | ICD-10-CM | POA: Diagnosis not present

## 2015-10-08 DIAGNOSIS — Z992 Dependence on renal dialysis: Secondary | ICD-10-CM | POA: Diagnosis not present

## 2015-10-08 DIAGNOSIS — N186 End stage renal disease: Secondary | ICD-10-CM | POA: Diagnosis not present

## 2015-10-08 DIAGNOSIS — D509 Iron deficiency anemia, unspecified: Secondary | ICD-10-CM | POA: Diagnosis not present

## 2015-10-08 DIAGNOSIS — E8779 Other fluid overload: Secondary | ICD-10-CM | POA: Diagnosis not present

## 2015-10-08 DIAGNOSIS — N2581 Secondary hyperparathyroidism of renal origin: Secondary | ICD-10-CM | POA: Diagnosis not present

## 2015-10-09 DIAGNOSIS — N2581 Secondary hyperparathyroidism of renal origin: Secondary | ICD-10-CM | POA: Diagnosis not present

## 2015-10-09 DIAGNOSIS — Z992 Dependence on renal dialysis: Secondary | ICD-10-CM | POA: Diagnosis not present

## 2015-10-09 DIAGNOSIS — E8779 Other fluid overload: Secondary | ICD-10-CM | POA: Diagnosis not present

## 2015-10-09 DIAGNOSIS — N186 End stage renal disease: Secondary | ICD-10-CM | POA: Diagnosis not present

## 2015-10-09 DIAGNOSIS — D631 Anemia in chronic kidney disease: Secondary | ICD-10-CM | POA: Diagnosis not present

## 2015-10-09 DIAGNOSIS — D509 Iron deficiency anemia, unspecified: Secondary | ICD-10-CM | POA: Diagnosis not present

## 2015-10-10 DIAGNOSIS — N186 End stage renal disease: Secondary | ICD-10-CM | POA: Diagnosis not present

## 2015-10-10 DIAGNOSIS — N2581 Secondary hyperparathyroidism of renal origin: Secondary | ICD-10-CM | POA: Diagnosis not present

## 2015-10-10 DIAGNOSIS — D509 Iron deficiency anemia, unspecified: Secondary | ICD-10-CM | POA: Diagnosis not present

## 2015-10-10 DIAGNOSIS — E8779 Other fluid overload: Secondary | ICD-10-CM | POA: Diagnosis not present

## 2015-10-10 DIAGNOSIS — Z992 Dependence on renal dialysis: Secondary | ICD-10-CM | POA: Diagnosis not present

## 2015-10-10 DIAGNOSIS — D631 Anemia in chronic kidney disease: Secondary | ICD-10-CM | POA: Diagnosis not present

## 2015-10-11 ENCOUNTER — Ambulatory Visit (INDEPENDENT_AMBULATORY_CARE_PROVIDER_SITE_OTHER): Payer: Medicare Other | Admitting: Endocrinology

## 2015-10-11 ENCOUNTER — Encounter: Payer: Self-pay | Admitting: Endocrinology

## 2015-10-11 VITALS — BP 122/68 | HR 90 | Temp 97.8°F | Resp 12 | Wt 308.0 lb

## 2015-10-11 DIAGNOSIS — E108 Type 1 diabetes mellitus with unspecified complications: Secondary | ICD-10-CM

## 2015-10-11 LAB — POCT GLYCOSYLATED HEMOGLOBIN (HGB A1C): Hemoglobin A1C: 8.4

## 2015-10-11 NOTE — Progress Notes (Signed)
Patient ID: Ryan FellsMyron W Arnold, male   DOB: 01/14/1961, 54 y.o.   MRN: [id number]    Chief complaint : Follow up of Type 1 Diabetes  History of Present Illness:          Date of diagnosis: Age 54      Prior history:     He has been on various insulin regimens  since diagnosis.   Was on insulin pump 2 years ago, and then taken off pump during hospitalization and never put back on it.  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 Novolog 22-26-28 before meals +/- correction doses  Recent history:  He has been on prednisone since 6/16 for his pulmonary condition and blood sugars have been  higher  Currently is taking 30 mg Although he thinks his blood sugars are better recently his A1c is still high at 8.4 Unable to review his blood sugars from his meter as he did not bring it today  Current blood sugar patterns, problems and treatment regimen:  He was told to increase his Lantus to 50 units and he thinks his fasting readings are better  He also thinks his blood sugars are not high later in the day  Previously was having readings of 400-500 periodically both after supper and fasting but he thinks he has had only one significantly high reading when he first had his episode of bronchitis 3 weeks ago   He thinks he is doing better with his food choices, previously eating higher fat meals at restaurants regularly  Has had one episode of mild hypoglycemia in the morning  He is still unable to get his insulin pump supplies and not clear why  Glucose monitoring:  done about 2 times a day         Glucometer: One Touch.      Blood Glucose reading analysis from recall  Mean values apply above for all meters except median for One Touch  PRE-MEAL Fasting Lunch Dinner Bedtime Overall  Glucose range: 58-150   135-170   Mean/median:          Symptoms of hypoglycemia: Weakness and  shakiness Self-care:   Meal times:  breakfast 5 am on dialysis days otherwise his first meal is at 12 noon, dinner 6 pm  Exercise:.  unable to do any         Most recent dietitian/nurse educator visit : Years ago           Diabetes labs:  Lab Results  Component Value Date   HGBA1C 8.4 10/11/2015   HGBA1C 8.7 06/14/2015   HGBA1C 7.0* 03/15/2015   Lab Results  Component Value Date   LDLCALC 65 06/14/2015   CREATININE 10.65* 09/16/2015        Medication List       This list is accurate as of: 10/11/15 10:46 AM.  Always use your most recent med list.               albuterol 108 (90 Base) MCG/ACT inhaler  Commonly known as:  PROVENTIL HFA  Inhale 2 puffs into the lungs every 4 (four) hours as needed for wheezing or shortness of breath.     allopurinol 100 MG tablet  Commonly known as:  ZYLOPRIM  Take 1 tablet (100 mg total) by mouth daily.     amLODipine 10 MG tablet  Commonly known as:  NORVASC  take 1 tablet by mouth once daily     aspirin 81 MG chewable tablet  Chew 1 tablet (81 mg total) by mouth daily.     atorvastatin 40 MG tablet  Commonly known as:  LIPITOR  Take 1 tablet (40 mg total) by mouth daily.     B-D ULTRAFINE III SHORT PEN 31G X 8 MM Misc  Generic drug:  Insulin Pen Needle  use as directed     clonazePAM 0.5 MG tablet  Commonly known as:  KLONOPIN  Take 0.5 mg by mouth at bedtime.     DULoxetine 60 MG capsule  Commonly known as:  CYMBALTA  Take 120 mg by mouth daily.     furosemide 80 MG tablet  Commonly known as:  LASIX  Take 1 tablet (80 mg total) by mouth 4 (four) times a week. Tuesday, Thursday, Saturday, and Sunday.     gabapentin 100 MG capsule  Commonly known as:  NEURONTIN  Take 100 mg by mouth daily.     hydrALAZINE 25 MG tablet  Commonly known as:  APRESOLINE  Take 1 tablet (25 mg total) by mouth 3 (three) times daily.     insulin aspart 100 UNIT/ML FlexPen  Commonly known as:  NOVOLOG  Inject 22 Units into the skin 3  (three) times daily with meals.     LANTUS SOLOSTAR 100 UNIT/ML Solostar Pen  Generic drug:  Insulin Glargine  INJECT 36 UNITS AT BREAKFAST AND 8 UNITS AT BEDTIME     metoprolol 50 MG tablet  Commonly known as:  LOPRESSOR  Take 50 mg by mouth 2 (two) times daily.     multivitamin Tabs tablet  Take 1 tablet by mouth daily.     pantoprazole 40 MG tablet  Commonly known as:  PROTONIX  take 1 tablet by mouth once daily     predniSONE 5 MG tablet  Commonly known as:  DELTASONE   daily for 1 month then  daily for 1 month     predniSONE 10 MG (21) Tbpk tablet  Commonly known as:  STERAPRED UNI-PAK 21 TAB  Take by mouth daily. 30 mg X2weeks  until f/u     SENSIPAR 30 MG tablet  Generic drug:  cinacalcet  Take 30 mg by mouth at bedtime.     sevelamer carbonate 800 MG tablet  Commonly known as:  RENVELA  Take 800 mg by mouth 3 (three) times daily. And 2 tablet with snacks     valsartan 80 MG tablet  Commonly known as:  DIOVAN  Take 80 mg by mouth at bedtime.        Allergies:  Allergies  Allergen Reactions  . Oxycodone-Acetaminophen Nausea And Vomiting    Past Medical History  Diagnosis Date  . Essential hypertension   . Hyperlipemia   . End stage renal disease (HCC)     a. on dialysis; still makes urine.  [name] BOOP (bronchiolitis obliterans with organizing pneumonia) (HCC)     a. 01/2018 s/p R thoracoscopy/Bx confirming BOOP;  b. 02/2018 high dose steroids started.  . Chronic diastolic CHF (congestive heart failure) (HCC)     a. 12/2014 Echo: EF 50-55%, mild conc LVH, mildly dil LA.  . Morbid obesity (HCC)   . Secondary hyperparathyroidism (HCC)   . Anemia of chronic disease   . Recurrent pneumonia     a. 2/2 BOOP.  [name] Chest pain     a. 06/2014 Myoview ([facility]) no ischemia/infarct, nl EF.  . Type 1 diabetes  mellitus (HCC)   . Thyroid disease   . OSA treated with BiPAP     Past Surgical History  Procedure Laterality Date  . Cataract Bilateral   .  Shoulder surgery Left   . Knee surgery Right   . Gallbladder surgery    . Lung biopsy      Family History  Problem Relation Age of Onset  . Diabetes Mother     died @ 55.  [name] Hypertension Mother   . Hypertension Maternal Grandmother     Social History:  reports that he has never smoked. He has never used smokeless tobacco. He reports that he does not drink alcohol or use illicit drugs.    Review of Systems:   CKD: He is on dialysis, goes on Mondays, Wednesdays and Fridays  He is on steroids for bronchiolitis obliterans and will need to be continued on prednisone He takes prednisone 30 mg in the morning and will be on the same dose still 1/17  He was treated by [facility] for leg ulcers  Lipids: Appear poorly controlled as of 6/16 but previously lipids were good.  Unable to identify LDL value on previous lab.  He thinks he is compliant with his Lipitor  Lab Results  Component Value Date   CHOL 194 06/14/2015   HDL 99.30 06/14/2015   LDLCALC 65 06/14/2015   TRIG 151.0* 06/14/2015   CHOLHDL 2 06/14/2015    Diabetes complications: Nephropathy   Physical Examination:  BP 122/68 mmHg  Pulse 90  Temp(Src) 97.8 F (36.6 C) (Oral)  Resp 12  Wt 308 lb (139.708 kg)  SpO2 96%         ASSESSMENT: See history of present illness for detailed discussion of his current management, blood sugar patterns and problems identified  Diabetes type 1 with consistently poor control with mostly postprandial hyperglycemia: Although A1c is only slightly better 8.4 he thinks his blood sugars are much better at home with better compliance  Problems identified:  He is getting more stable blood sugars but still has some variability in the mornings  Not clear if he still has postprandial hyperglycemia and will need to review his monitor on the next visit  Still eating out frequently and has difficulty losing weight  PLAN:   He will try to get back on his insulin pump.  He is currently  taking 50 units of basal and most likely will need to start with a 1.6 basal rate overnight and 2.0 from 6 AM-10 PM and carbohydrate coverage of 1:8  If his fasting readings start getting low he will reduce his Lantus by 4 units  No change and mealtime insulin and continue to try and take Novolog before eating consistently  There are no Patient Instructions on file for this visit.    Ryan Arnold 10/11/2015, 10:46 AM

## 2015-10-12 DIAGNOSIS — E8779 Other fluid overload: Secondary | ICD-10-CM | POA: Diagnosis not present

## 2015-10-12 DIAGNOSIS — N2581 Secondary hyperparathyroidism of renal origin: Secondary | ICD-10-CM | POA: Diagnosis not present

## 2015-10-12 DIAGNOSIS — N186 End stage renal disease: Secondary | ICD-10-CM | POA: Diagnosis not present

## 2015-10-12 DIAGNOSIS — D509 Iron deficiency anemia, unspecified: Secondary | ICD-10-CM | POA: Diagnosis not present

## 2015-10-12 DIAGNOSIS — D631 Anemia in chronic kidney disease: Secondary | ICD-10-CM | POA: Diagnosis not present

## 2015-10-12 DIAGNOSIS — Z992 Dependence on renal dialysis: Secondary | ICD-10-CM | POA: Diagnosis not present

## 2015-10-14 DIAGNOSIS — Z992 Dependence on renal dialysis: Secondary | ICD-10-CM | POA: Diagnosis not present

## 2015-10-14 DIAGNOSIS — N186 End stage renal disease: Secondary | ICD-10-CM | POA: Diagnosis not present

## 2015-10-15 DIAGNOSIS — E8779 Other fluid overload: Secondary | ICD-10-CM | POA: Diagnosis not present

## 2015-10-15 DIAGNOSIS — N2581 Secondary hyperparathyroidism of renal origin: Secondary | ICD-10-CM | POA: Diagnosis not present

## 2015-10-15 DIAGNOSIS — D509 Iron deficiency anemia, unspecified: Secondary | ICD-10-CM | POA: Diagnosis not present

## 2015-10-15 DIAGNOSIS — D631 Anemia in chronic kidney disease: Secondary | ICD-10-CM | POA: Diagnosis not present

## 2015-10-15 DIAGNOSIS — E8770 Fluid overload, unspecified: Secondary | ICD-10-CM | POA: Diagnosis not present

## 2015-10-15 DIAGNOSIS — N186 End stage renal disease: Secondary | ICD-10-CM | POA: Diagnosis not present

## 2015-10-15 DIAGNOSIS — Z992 Dependence on renal dialysis: Secondary | ICD-10-CM | POA: Diagnosis not present

## 2015-10-16 ENCOUNTER — Other Ambulatory Visit: Payer: BC Managed Care – PPO

## 2015-10-17 DIAGNOSIS — E162 Hypoglycemia, unspecified: Secondary | ICD-10-CM | POA: Diagnosis not present

## 2015-10-25 ENCOUNTER — Ambulatory Visit (INDEPENDENT_AMBULATORY_CARE_PROVIDER_SITE_OTHER): Payer: Medicare Other | Admitting: Cardiovascular Disease

## 2015-10-25 ENCOUNTER — Encounter: Payer: Self-pay | Admitting: Cardiovascular Disease

## 2015-10-25 VITALS — BP 120/64 | HR 76 | Ht 73.0 in | Wt 311.5 lb

## 2015-10-25 DIAGNOSIS — E785 Hyperlipidemia, unspecified: Secondary | ICD-10-CM | POA: Diagnosis not present

## 2015-10-25 DIAGNOSIS — I10 Essential (primary) hypertension: Secondary | ICD-10-CM | POA: Diagnosis not present

## 2015-10-25 DIAGNOSIS — G4733 Obstructive sleep apnea (adult) (pediatric): Secondary | ICD-10-CM | POA: Diagnosis not present

## 2015-10-25 DIAGNOSIS — E108 Type 1 diabetes mellitus with unspecified complications: Secondary | ICD-10-CM

## 2015-10-25 DIAGNOSIS — R0789 Other chest pain: Secondary | ICD-10-CM

## 2015-10-25 DIAGNOSIS — J8489 Other specified interstitial pulmonary diseases: Secondary | ICD-10-CM

## 2015-10-25 DIAGNOSIS — I5032 Chronic diastolic (congestive) heart failure: Secondary | ICD-10-CM

## 2015-10-25 DIAGNOSIS — R079 Chest pain, unspecified: Secondary | ICD-10-CM | POA: Insufficient documentation

## 2015-10-25 NOTE — Assessment & Plan Note (Signed)
Currently on BiPAP Managed by pulmonary

## 2015-10-25 NOTE — Assessment & Plan Note (Signed)
Fluid status management by hemodialysis Dry weight recently increased for hypotension, cramping Unclear if he Needs Lasix 4 days per week given continued drop in his pressure during dialysis Potentially could hold Lasix, use midodrine

## 2015-10-25 NOTE — Assessment & Plan Note (Signed)
Hemoglobin A1c greater than 8 Recommended low, hydrate diet, weight loss. Symptoms exacerbated by prednisone

## 2015-10-25 NOTE — Assessment & Plan Note (Signed)
Blood pressure is well controlled on today's visit. No changes made to the medications. 

## 2015-10-25 NOTE — Assessment & Plan Note (Signed)
Currently on 30 mg of prednisone daily. Managed by pulmonary He does report increased shortness of breath, oxygen requirement recently chest painch

## 2015-10-25 NOTE — Assessment & Plan Note (Signed)
We have encouraged continued exercise, careful diet management in an effort to lose weight. 

## 2015-10-25 NOTE — Assessment & Plan Note (Signed)
Previous episodes of chest pain, managed by Dr. Kirke CorinArida, None recently No further testing at this time, prior stress test last year

## 2015-10-25 NOTE — Assessment & Plan Note (Signed)
Cholesterol is at goal on the current lipid regimen. No changes to the medications were made.  

## 2015-10-25 NOTE — Progress Notes (Signed)
Patient ID: Ryan Arnold, male    DOB: Feb 20, 1961, 55 y.o.   MRN: [id number]  HPI Comments: 55 year old African-American male with morbid obesity, Boop, on chronic prednisone, poorly controlled type 2 diabetes, end-stage renal disease on hemodialysis, hypertension, hyperlipidemia, who presents for follow-up of prior episodes of chest pain   history of sleep apnea, on BiPAP, managed by pulmonary    in follow-up today, denies any significant chest pain on exertion He has chronic shortness of breath, uses oxygen by nasal cannula. Recent increase in his oxygen requirements, prednisone dose was increased up to 30 mg daily by pulmonary. Reports having low blood pressure, cramping through dialysis. Dry weight was increased from 135 kg up to 136 kg by Dr. Cherylann Ratel, in the past week Still continues to have mild drop in his pressure, hemodialysis has to be stopped until blood pressure improves.  Reports having poor urine stream, possibly was better in the past on Flomax. Suposed to be wearing compression hose, not wearing these consistently, has leg edema  Lab work reviewed with him showing hemoglobin A1c 8.4, total cholesterol 194, LDL 65  EKG on today's visit shows normal sinus rhythm with rate 75 bpm, left axis deviation  Other past medical history He was evaluated before at [facility] with a pharmacologic nuclear stress test in September 2015 which showed no evidence of ischemia with normal ejection fraction.   Echocardiogram in March showed normal LV systolic function with mild left ventricular hypertrophy.   He had a sleep study done also recently which showed evidence of severe sleep apnea with a 4 beat run of wide-complex tachycardia.   Allergies  Allergen Reactions  . Oxycodone-Acetaminophen Nausea And Vomiting    Current Outpatient Prescriptions on File Prior to Visit  Medication Sig Dispense Refill  . albuterol (PROVENTIL HFA) 108 (90 BASE) MCG/ACT inhaler Inhale 2 puffs into the lungs  every 4 (four) hours as needed for wheezing or shortness of breath. 1 Inhaler 0  . allopurinol (ZYLOPRIM) 100 MG tablet Take 1 tablet (100 mg total) by mouth daily. 90 tablet 1  . amLODipine (NORVASC) 10 MG tablet take 1 tablet by mouth once daily 30 tablet 2  . aspirin 81 MG chewable tablet Chew 1 tablet (81 mg total) by mouth daily. 30 tablet 2  . atorvastatin (LIPITOR) 40 MG tablet Take 1 tablet (40 mg total) by mouth daily. (Patient taking differently: Take 20 mg by mouth at bedtime. ) 90 tablet 3  . B-D ULTRAFINE III SHORT PEN 31G X 8 MM MISC use as directed 100 each 11  . clonazePAM (KLONOPIN) 0.5 MG tablet Take 0.5 mg by mouth at bedtime.    . DULoxetine (CYMBALTA) 60 MG capsule Take 120 mg by mouth daily.  0  . furosemide (LASIX) 80 MG tablet Take 1 tablet (80 mg total) by mouth 4 (four) times a week. Tuesday, Thursday, Saturday, and Sunday. 36 tablet 3  . gabapentin (NEURONTIN) 100 MG capsule Take 100 mg by mouth daily.  1  . hydrALAZINE (APRESOLINE) 25 MG tablet Take 1 tablet (25 mg total) by mouth 3 (three) times daily. 90 tablet 1  . insulin aspart (NOVOLOG) 100 UNIT/ML FlexPen Inject 22 Units into the skin 3 (three) times daily with meals. 15 mL 2  . LANTUS SOLOSTAR 100 UNIT/ML Solostar Pen INJECT 36 UNITS AT BREAKFAST AND 8 UNITS AT BEDTIME 15 mL 3  . metoprolol (LOPRESSOR) 50 MG tablet Take 50 mg by mouth 2 (two) times daily.    [name]  multivitamin (RENA-VIT) TABS tablet Take 1 tablet by mouth daily.    . pantoprazole (PROTONIX) 40 MG tablet take 1 tablet by mouth once daily 30 tablet 0  . predniSONE (DELTASONE) 5 MG tablet 20mg  daily for 1 month then 15mg  daily for 1 month (Patient taking differently: Takes 30 mg daily.) 220 tablet 0  . predniSONE (STERAPRED UNI-PAK 21 TAB) 10 MG (21) TBPK tablet Take by mouth daily. 30 mg X2weeks 20mg  until f/u 150 tablet 0  . SENSIPAR 30 MG tablet Take 30 mg by mouth at bedtime.     . sevelamer carbonate (RENVELA) 800 MG tablet Take 800 mg by mouth 3  (three) times daily. And 2 tablet with snacks    . valsartan (DIOVAN) 80 MG tablet Take 80 mg by mouth at bedtime.  0   No current facility-administered medications on file prior to visit.    Past Medical History  Diagnosis Date  . Essential hypertension   . Hyperlipemia   . End stage renal disease (HCC)     a. on dialysis; still makes urine.  [name] BOOP (bronchiolitis obliterans with organizing pneumonia) (HCC)     a. 01/2018 s/p R thoracoscopy/Bx confirming BOOP;  b. 02/2018 high dose steroids started.  . Chronic diastolic CHF (congestive heart failure) (HCC)     a. 12/2014 Echo: EF 50-55%, mild conc LVH, mildly dil LA.  . Morbid obesity (HCC)   . Secondary hyperparathyroidism (HCC)   . Anemia of chronic disease   . Recurrent pneumonia     a. 2/2 BOOP.  [name] Chest pain     a. 06/2014 Myoview ([facility]) no ischemia/infarct, nl EF.  . Type 1 diabetes mellitus (HCC)   . Thyroid disease   . OSA treated with BiPAP     Past Surgical History  Procedure Laterality Date  . Cataract Bilateral   . Shoulder surgery Left   . Knee surgery Right   . Gallbladder surgery    . Lung biopsy      Social History  reports that he has never smoked. He has never used smokeless tobacco. He reports that he does not drink alcohol or use illicit drugs.  Family History family history includes Diabetes in his mother; Hypertension in his maternal grandmother and mother.   Review of Systems  Constitutional: Negative.   Respiratory: Positive for shortness of breath.   Cardiovascular: Positive for leg swelling.  Gastrointestinal: Negative.   Musculoskeletal: Negative.   Neurological: Negative.   Hematological: Negative.   Psychiatric/Behavioral: Negative.   All other systems reviewed and are negative.   BP 120/64 mmHg  Pulse 76  Ht 6\' 1"  (1.854 m)  Wt 311 lb 8 oz (141.295 kg)  BMI 41.11 kg/m2   Physical Exam  Constitutional: He is oriented to person, place, and time. He appears well-developed and  well-nourished.  Obese  HENT:  Head: Normocephalic.  Nose: Nose normal.  Mouth/Throat: Oropharynx is clear and moist.  Eyes: Conjunctivae are normal. Pupils are equal, round, and reactive to light.  Neck: Normal range of motion. Neck supple. No JVD present.  Cardiovascular: Normal rate, regular rhythm, normal heart sounds and intact distal pulses.  Exam reveals no gallop and no friction rub.   No murmur heard. Trace pitting edema to above the ankles bilaterally  Pulmonary/Chest: Effort normal and breath sounds normal. No respiratory distress. He has no wheezes. He has no rales. He exhibits no tenderness.  Abdominal: Soft. Bowel sounds are normal. He exhibits no distension. There is no tenderness.  Musculoskeletal: Normal range of motion. He exhibits no edema or tenderness.  Lymphadenopathy:    He has no cervical adenopathy.  Neurological: He is alert and oriented to person, place, and time. Coordination normal.  Skin: Skin is warm and dry. No rash noted. No erythema.  Psychiatric: He has a normal mood and affect. His behavior is normal. Judgment and thought content normal.

## 2015-10-25 NOTE — Patient Instructions (Addendum)
You are doing well. No medication changes were made.  Please ask Ryan Arnold if you still need lasix/furosemide (because of drops in blood pressure) Also ask if you need midodrine if blood pressure continues to drop with dialysis  Ask Ryan Arnold about flomax for urination  Please call Ryan Arnold if you have new issues that need to be addressed before your next appt.  Your physician wants you to follow-up in: 6 months with Ryan Arnold You will receive a reminder letter in the mail two months in advance. If you don't receive a letter, please call our office to schedule the follow-up appointment.

## 2015-10-26 DIAGNOSIS — Z600 Problems of adjustment to life-cycle transitions: Secondary | ICD-10-CM | POA: Diagnosis not present

## 2015-10-26 DIAGNOSIS — F3342 Major depressive disorder, recurrent, in full remission: Secondary | ICD-10-CM | POA: Diagnosis not present

## 2015-10-26 DIAGNOSIS — F419 Anxiety disorder, unspecified: Secondary | ICD-10-CM | POA: Diagnosis not present

## 2015-10-30 ENCOUNTER — Encounter: Payer: Self-pay | Admitting: Internal Medicine

## 2015-10-30 ENCOUNTER — Ambulatory Visit (INDEPENDENT_AMBULATORY_CARE_PROVIDER_SITE_OTHER): Payer: Medicare Other | Admitting: Internal Medicine

## 2015-10-30 VITALS — BP 132/80 | HR 81 | Ht 73.0 in | Wt 318.0 lb

## 2015-10-30 DIAGNOSIS — J8489 Other specified interstitial pulmonary diseases: Secondary | ICD-10-CM

## 2015-10-30 MED ORDER — PREDNISONE 20 MG PO TABS: 20.0000 mg | ORAL_TABLET | Freq: Every day | ORAL | Status: DC

## 2015-10-30 NOTE — Progress Notes (Signed)
MRN# [id number] Ryan Arnold 16-Feb-1961   CC: Chief Complaint  Patient presents with  . Follow-up    feels back to his normal; started Prednisone  last weekend   Synopsis: 55 year old male past medical history of end-stage renal disease on dialysis, uncontrolled diabetes, hypertension, former retired Emergency planning/management officer, seen in consultation for recurrent pneumonias. Multiple hospitalizations at [facility] and at [facility] over the past one year for bilateral groundglass opacities throughout entire lung fields requiring multiple rounds of antibiotics and steroids. Had a bronchoscopy done at [facility] and within the last 2 years no acute findings from the BAL. Patient with recent bronchoscopy, March 2016, at [facility] by Dr. Dema Severin, BAL with no acute findings. April 2016 surgical lung bx (wedge) by Dr. Thelma Barge, path confirms BOOP, started on high dose steroids.   Events since last clinic visit: Patient presents today for a BOOP acute care visit.  He was seen on 09/28/15 gven 1 week of levaquin for a presumptive dx of bronchitis, has been doing well since then. Started prednisone  daily this past weekend, no worsening of symptoms.      Medication:   Current Outpatient Rx  Name  Route  Sig  Dispense  Refill  . albuterol (PROVENTIL HFA) 108 (90 BASE) MCG/ACT inhaler   Inhalation   Inhale 2 puffs into the lungs every 4 (four) hours as needed for wheezing or shortness of breath.   1 Inhaler   0   . allopurinol (ZYLOPRIM) 100 MG tablet   Oral   Take 1 tablet (100 mg total) by mouth daily.   90 tablet   1   . amLODipine (NORVASC) 10 MG tablet      take 1 tablet by mouth once daily   30 tablet   2   . aspirin 81 MG chewable tablet   Oral   Chew 1 tablet (81 mg total) by mouth daily.   30 tablet   2   . atorvastatin (LIPITOR) 40 MG tablet   Oral   Take 1 tablet (40 mg total) by mouth daily. Patient taking differently: Take 20 mg by mouth at bedtime.    90 tablet   3   . B-D ULTRAFINE III  SHORT PEN 31G X 8 MM MISC      use as directed   100 each   11     Phone number to our clinic (as requested) is [phone] ...   . clonazePAM (KLONOPIN) 0.5 MG tablet   Oral   Take 0.5 mg by mouth at bedtime.         . DULoxetine (CYMBALTA) 60 MG capsule   Oral   Take 120 mg by mouth daily.      0   . furosemide (LASIX) 80 MG tablet   Oral   Take 1 tablet (80 mg total) by mouth 4 (four) times a week. Tuesday, Thursday, Saturday, and Sunday.   36 tablet   3   . gabapentin (NEURONTIN) 100 MG capsule   Oral   Take 100 mg by mouth daily.      1   . insulin aspart (NOVOLOG) 100 UNIT/ML FlexPen   Subcutaneous   Inject 22 Units into the skin 3 (three) times daily with meals.   15 mL   2   . LANTUS SOLOSTAR 100 UNIT/ML Solostar Pen      INJECT 36 UNITS AT BREAKFAST AND 8 UNITS AT BEDTIME   15 mL   3     Dispense as written.  Dose Change   . metoprolol (LOPRESSOR) 50 MG tablet   Oral   Take 50 mg by mouth 2 (two) times daily.         . multivitamin (RENA-VIT) TABS tablet   Oral   Take 1 tablet by mouth daily.         . pantoprazole (PROTONIX) 40 MG tablet      take 1 tablet by mouth once daily   30 tablet   0   . SENSIPAR 30 MG tablet   Oral   Take 30 mg by mouth at bedtime.            Dispense as written.   . sevelamer carbonate (RENVELA) 800 MG tablet   Oral   Take 800 mg by mouth 3 (three) times daily. And 2 tablet with snacks         . valsartan (DIOVAN) 80 MG tablet   Oral   Take 80 mg by mouth at bedtime.      0   . predniSONE (DELTASONE) 20 MG tablet   Oral   Take 1 tablet (20 mg total) by mouth daily with breakfast.   60 tablet   0      Review of Systems: Gen:  Denies  fever, sweats, chills HEENT: Denies blurred vision, double vision, ear pain, eye pain, hearing loss, nose bleeds, sore throat Cvc:  No dizziness, chest pain or heaviness Resp:   Admits HY:[id number] dyspnea, mild non productive cough Gi: Denies swallowing  difficulty, stomach pain, nausea or vomiting, diarrhea, constipation, bowel incontinence Gu:  Denies bladder incontinence, burning urine Ext:   No Joint pain, stiffness or swelling Skin: No skin rash, easy bruising or bleeding or hives Endoc:  No polyuria, polydipsia , polyphagia or weight change Other:  All other systems negative  Allergies:  Oxycodone-acetaminophen  Physical Examination:  VS: BP 132/80 mmHg  Pulse 81  Ht  (1.854 m)  Wt 318 lb (144.244 kg)  BMI 41.96 kg/m2  SpO2 93%  General Appearance: No distress  HEENT: PERRLA, no ptosis, no other lesions noticed Pulmonary: Good respiratory effort, mild decreased breath sounds at the bases(this is baseline), no wheezes, no crackles Cardiovascular:  Normal S1,S2.  No m/r/g.     Abdomen:Exam: Benign, Soft, non-tender, No masses  Skin:   warm, no ecchymosis, fine milia appearance lesions on the face and forehead (no erythema, no eruption) Extremities: normal, no cyanosis, clubbing, warm with normal capillary refill.      Rad results: (The following images and results were reviewed by Dr. Dema Severin on 10/30/2015). CXR 09/28/15 EXAM: CHEST 2 VIEW  COMPARISON: 09/16/2015  FINDINGS: Patchy right lung airspace disease again noted, unchanged. No focal opacity on the left. Heart is borderline in size. No effusions or acute bony abnormality.  IMPRESSION: Patchy right lung opacities are stable. Borderline heart size.   CXR 08/14/15 CHEST 2 VIEW  COMPARISON: 03/19/2015  FINDINGS: Mild cardiomegaly. Blunting of the right costophrenic angle likely reflects scarring/ pleural thickening. No confluent airspace opacities, effusions or edema. No acute bony abnormality.  IMPRESSION: Cardiomegaly. No active disease.   CXR 03/19/15 CHEST 2 VIEW  COMPARISON: 03/18/2015  FINDINGS: Progression of bilateral airspace disease left greater than right. Cardiac enlargement. Right pleural effusion is small and  unchanged. No significant effusion on the left. No pneumothorax.  IMPRESSION: Progression of bilateral airspace disease left greater than right. Differential includes pulmonary infection and pulmonary edema. Pulmonary hemorrhage also a possibility.    Assessment and Plan:54 yo male  seen in follow up for BOOP. Patient doing well on current dose. No exacerbations of SOB, no signs of infection at this time. 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 Today we decreased his Prednisone to .   Plan: - continue with Prednisone  until follow up. Patient is advised if symptoms return, and is persistent for 3-4 days, then increase prednisone to 30 mg daily until follow-up visit - If there is another relapse of BOOP, will have to consider another bronchoscopy to rule out atypical infections exacerbating BOOP. - continue with 3L O2 with exertion and with sleep

## 2015-10-30 NOTE — Assessment & Plan Note (Signed)
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 Today we decreased his Prednisone to .   Plan: - continue with Prednisone  until follow up. Patient is advised if symptoms return, and is persistent for 3-4 days, then increase prednisone to 30 mg daily until follow-up visit - If there is another relapse of BOOP, will have to consider another bronchoscopy to rule out atypical infections exacerbating BOOP. - continue with 3L O2 with exertion and with sleep

## 2015-10-30 NOTE — Patient Instructions (Addendum)
Follow up with Dr. Dema Severin in: 1 month - cont with supplemental oxygen - cont with Prednisone  daily, until follow up  - cont with exercise as tolerated, healthy diet.  - If symptoms return, and is persistent for 3-4 days, then increase prednisone to 30 mg daily until follow-up visit

## 2015-11-06 DIAGNOSIS — E291 Testicular hypofunction: Secondary | ICD-10-CM | POA: Diagnosis not present

## 2015-11-06 DIAGNOSIS — N189 Chronic kidney disease, unspecified: Secondary | ICD-10-CM | POA: Diagnosis not present

## 2015-11-07 ENCOUNTER — Encounter: Payer: Self-pay | Admitting: Family Medicine

## 2015-11-13 ENCOUNTER — Telehealth: Payer: Self-pay | Admitting: Nutrition

## 2015-11-13 NOTE — Telephone Encounter (Signed)
Message left on my machine that he still does not have his pump supplies. I phoned him back and gave him the telephone number to call to order his supplies and give his insurance information.  He agreed to do this.

## 2015-11-14 ENCOUNTER — Telehealth: Payer: Self-pay | Admitting: Endocrinology

## 2015-11-14 DIAGNOSIS — D509 Iron deficiency anemia, unspecified: Secondary | ICD-10-CM | POA: Diagnosis not present

## 2015-11-14 DIAGNOSIS — Z992 Dependence on renal dialysis: Secondary | ICD-10-CM | POA: Diagnosis not present

## 2015-11-14 DIAGNOSIS — D631 Anemia in chronic kidney disease: Secondary | ICD-10-CM | POA: Diagnosis not present

## 2015-11-14 DIAGNOSIS — N186 End stage renal disease: Secondary | ICD-10-CM | POA: Diagnosis not present

## 2015-11-14 DIAGNOSIS — N2581 Secondary hyperparathyroidism of renal origin: Secondary | ICD-10-CM | POA: Diagnosis not present

## 2015-11-14 DIAGNOSIS — E8770 Fluid overload, unspecified: Secondary | ICD-10-CM | POA: Diagnosis not present

## 2015-11-14 NOTE — Telephone Encounter (Signed)
Patient is returning your call.  

## 2015-11-19 NOTE — Telephone Encounter (Signed)
Patient said he has not received his pump supplies yet.  He was given the telphone number to [facility] to call.

## 2015-11-20 ENCOUNTER — Other Ambulatory Visit: Payer: Self-pay | Admitting: *Deleted

## 2015-11-20 ENCOUNTER — Telehealth: Payer: Self-pay | Admitting: Nutrition

## 2015-11-20 DIAGNOSIS — E108 Type 1 diabetes mellitus with unspecified complications: Secondary | ICD-10-CM

## 2015-11-20 NOTE — Telephone Encounter (Signed)
Message left on his machine to call to set up a lab appointment for a C-Peptide and FBS.  Told him his blood sugar needs to be less than 225 the morning he comes in

## 2015-11-23 ENCOUNTER — Encounter: Payer: Self-pay | Admitting: Family Medicine

## 2015-11-28 NOTE — Telephone Encounter (Signed)
Error. No encounter 

## 2015-12-03 ENCOUNTER — Ambulatory Visit (INDEPENDENT_AMBULATORY_CARE_PROVIDER_SITE_OTHER): Payer: Medicare Other | Admitting: Family Medicine

## 2015-12-03 ENCOUNTER — Encounter: Payer: Self-pay | Admitting: Family Medicine

## 2015-12-03 VITALS — BP 142/70 | HR 88 | Temp 98.2°F | Ht 73.0 in | Wt 320.0 lb

## 2015-12-03 DIAGNOSIS — J988 Other specified respiratory disorders: Secondary | ICD-10-CM

## 2015-12-03 DIAGNOSIS — I10 Essential (primary) hypertension: Secondary | ICD-10-CM

## 2015-12-03 DIAGNOSIS — N186 End stage renal disease: Secondary | ICD-10-CM

## 2015-12-03 DIAGNOSIS — B9789 Other viral agents as the cause of diseases classified elsewhere: Secondary | ICD-10-CM

## 2015-12-03 DIAGNOSIS — B349 Viral infection, unspecified: Secondary | ICD-10-CM | POA: Diagnosis not present

## 2015-12-03 DIAGNOSIS — Z992 Dependence on renal dialysis: Secondary | ICD-10-CM

## 2015-12-03 DIAGNOSIS — E785 Hyperlipidemia, unspecified: Secondary | ICD-10-CM | POA: Diagnosis not present

## 2015-12-03 DIAGNOSIS — E108 Type 1 diabetes mellitus with unspecified complications: Secondary | ICD-10-CM

## 2015-12-03 NOTE — Patient Instructions (Addendum)
It was nice to see you today.  Continue your current medications.  Call me if you worsen.    Follow up:  Return in about 6 months (around 06/01/2016).  Take care  Dr. Adriana Simas

## 2015-12-03 NOTE — Progress Notes (Signed)
Pre visit review using our clinic review tool, if applicable. No additional management support is needed unless otherwise documented below in the visit note. 

## 2015-12-04 ENCOUNTER — Ambulatory Visit (INDEPENDENT_AMBULATORY_CARE_PROVIDER_SITE_OTHER): Payer: Medicare Other | Admitting: Internal Medicine

## 2015-12-04 ENCOUNTER — Encounter: Payer: Self-pay | Admitting: Internal Medicine

## 2015-12-04 VITALS — BP 144/78 | HR 91 | Ht 73.0 in | Wt 323.0 lb

## 2015-12-04 DIAGNOSIS — J988 Other specified respiratory disorders: Secondary | ICD-10-CM

## 2015-12-04 DIAGNOSIS — J8489 Other specified interstitial pulmonary diseases: Secondary | ICD-10-CM | POA: Diagnosis not present

## 2015-12-04 DIAGNOSIS — B9789 Other viral agents as the cause of diseases classified elsewhere: Secondary | ICD-10-CM | POA: Insufficient documentation

## 2015-12-04 MED ORDER — SULFAMETHOXAZOLE-TRIMETHOPRIM 800-160 MG PO TABS: 1.0000 | ORAL_TABLET | Freq: Two times a day (BID) | ORAL | Status: DC

## 2015-12-04 NOTE — Patient Instructions (Signed)
Follow up with Dr. Dema Severin in:6 weeks - And tenuous prednisone 20 mg daily for the next 2 weeks. Starting Monday, March 6, start prednisone 15 mg daily until follow-up. -Bactrim DS, 1 tab orally, on Tuesdays/Thursdays/Saturdays.  - This is a prophylactic antibiotic, and you'll be taking it until your wean down to prednisone 10 mg daily -Avoid sick contacts -We will give you a prescription for portable O2 concentrator, or send paperwork to your DME

## 2015-12-04 NOTE — Assessment & Plan Note (Signed)
Uncontrolled. Followed by Endocrine.  Difficulty controlling in setting of steroid use. Advised to follow up closely with Endo.

## 2015-12-04 NOTE — Progress Notes (Signed)
MRN# [id number] Ryan Arnold 02-09-1961   CC: Chief Complaint  Patient presents with  . Follow-up    pt. states breathing is baseline. c/o  occ. SOB. prod. cough brownish in color. occ. wheezing. runny nose X2d denies chest pain/tightness.   Synopsis: 55 year old male past medical history of end-stage renal disease on dialysis, uncontrolled diabetes, hypertension, former retired Emergency planning/management officer, seen in consultation for recurrent pneumonias. Multiple hospitalizations at [facility] and at [facility] over the past one year for bilateral groundglass opacities throughout entire lung fields requiring multiple rounds of antibiotics and steroids. Had a bronchoscopy done at [facility] and within the last 2 years no acute findings from the BAL. Patient with recent bronchoscopy, March 2016, at [facility] by Dr. Dema Severin, BAL with no acute findings. April 2016 surgical lung bx (wedge) by Dr. Thelma Barge, path confirms BOOP, started on high dose steroids.   Events since last clinic visit: Patient presents today for a BOOP follow up. She states that the last 3 days he's had cough with productive sputum with brownish tinge, mild increased shortness of breath, and clear nasal discharge. Overall pulmonary status is stable. No new other symptoms. Today we also discussed the possibility of bronchoscopy if unable to wean steroids and possibly referral to [facility] clinic if in the next 2-3 months unable to wean steroids with a negative follow-up bronchoscopy     Medication:   Current Outpatient Rx  Name  Route  Sig  Dispense  Refill  . albuterol (PROVENTIL HFA) 108 (90 BASE) MCG/ACT inhaler   Inhalation   Inhale 2 puffs into the lungs every 4 (four) hours as needed for wheezing or shortness of breath.   1 Inhaler   0   . allopurinol (ZYLOPRIM) 100 MG tablet   Oral   Take 1 tablet (100 mg total) by mouth daily.   90 tablet   1   . amLODipine (NORVASC) 10 MG tablet      take 1 tablet by mouth once daily   30 tablet   2   .  aspirin 81 MG chewable tablet   Oral   Chew 1 tablet (81 mg total) by mouth daily.   30 tablet   2   . atorvastatin (LIPITOR) 40 MG tablet   Oral   Take 1 tablet (40 mg total) by mouth daily. Patient taking differently: Take 20 mg by mouth at bedtime.    90 tablet   3   . B-D ULTRAFINE III SHORT PEN 31G X 8 MM MISC      use as directed   100 each   11     Phone number to our clinic (as requested) is [phone] ...   . DULoxetine (CYMBALTA) 60 MG capsule   Oral   Take 120 mg by mouth daily.      0   . furosemide (LASIX) 80 MG tablet   Oral   Take 1 tablet (80 mg total) by mouth 4 (four) times a week. Tuesday, Thursday, Saturday, and Sunday.   36 tablet   3   . gabapentin (NEURONTIN) 100 MG capsule   Oral   Take 100 mg by mouth daily.      1   . insulin aspart (NOVOLOG) 100 UNIT/ML FlexPen   Subcutaneous   Inject 22 Units into the skin 3 (three) times daily with meals.   15 mL   2   . LANTUS SOLOSTAR 100 UNIT/ML Solostar Pen      INJECT 36 UNITS AT BREAKFAST  AND 8 UNITS AT BEDTIME   15 mL   3     Dispense as written.    Dose Change   . metoprolol (LOPRESSOR) 50 MG tablet   Oral   Take 50 mg by mouth 2 (two) times daily.         . multivitamin (RENA-VIT) TABS tablet   Oral   Take 1 tablet by mouth daily.         . pantoprazole (PROTONIX) 40 MG tablet      take 1 tablet by mouth once daily   30 tablet   0   . predniSONE (DELTASONE) 20 MG tablet   Oral   Take 1 tablet (20 mg total) by mouth daily with breakfast.   60 tablet   0   . SENSIPAR 30 MG tablet   Oral   Take 30 mg by mouth at bedtime.            Dispense as written.   . sevelamer carbonate (RENVELA) 800 MG tablet   Oral   Take 800 mg by mouth 3 (three) times daily. And 2 tablet with snacks         . sulfamethoxazole-trimethoprim (BACTRIM DS,SEPTRA DS) 800-160 MG tablet   Oral   Take 1 tablet by mouth 2 (two) times daily.   30 tablet   1     1tab every Tuesday thurdays  and Saturday.   . valsartan (DIOVAN) 80 MG tablet   Oral   Take 80 mg by mouth at bedtime.      0      Review of Systems: Gen:  Denies  fever, sweats, chills HEENT: Denies blurred vision, double vision, ear pain, eye pain, hearing loss, nose bleeds, sore throat Cvc:  No dizziness, chest pain or heaviness Resp:   Admits GM:WNUUVOZ dyspnea, mild non productive cough Gi: Denies swallowing difficulty, stomach pain, nausea or vomiting, diarrhea, constipation, bowel incontinence Gu:  Denies bladder incontinence, burning urine Ext:   No Joint pain, stiffness or swelling Skin: No skin rash, easy bruising or bleeding or hives Endoc:  No polyuria, polydipsia , polyphagia or weight change Other:  All other systems negative  Allergies:  Oxycodone-acetaminophen  Physical Examination:  VS: BP 144/78 mmHg  Pulse 91  Ht  (1.854 m)  Wt 323 lb (146.512 kg)  BMI 42.62 kg/m2  SpO2 94%  General Appearance: No distress  HEENT: PERRLA, no ptosis, no other lesions noticed Pulmonary: Good respiratory effort, mild decreased breath sounds at the bases(this is baseline), no wheezes, no crackles Cardiovascular:  Normal S1,S2.  No m/r/g.     Abdomen:Exam: Benign, Soft, non-tender, No masses  Skin:   warm, no ecchymosis, fine milia appearance lesions on the face and forehead (no erythema, no eruption) Extremities: normal, no cyanosis, clubbing, warm with normal capillary refill.      Rad results: (The following images and results were reviewed by Dr. Dema Severin on 12/04/2015). CXR 09/28/15 EXAM: CHEST 2 VIEW  COMPARISON: 09/16/2015  FINDINGS: Patchy right lung airspace disease again noted, unchanged. No focal opacity on the left. Heart is borderline in size. No effusions or acute bony abnormality.  IMPRESSION: Patchy right lung opacities are stable. Borderline heart size.   CXR 08/14/15 CHEST 2 VIEW  COMPARISON: 03/19/2015  FINDINGS: Mild cardiomegaly. Blunting of the right  costophrenic angle likely reflects scarring/ pleural thickening. No confluent airspace opacities, effusions or edema. No acute bony abnormality.  IMPRESSION: Cardiomegaly. No active disease.   CXR 03/19/15 CHEST 2  VIEW  COMPARISON: 03/18/2015  FINDINGS: Progression of bilateral airspace disease left greater than right. Cardiac enlargement. Right pleural effusion is small and unchanged. No significant effusion on the left. No pneumothorax.  IMPRESSION: Progression of bilateral airspace disease left greater than right. Differential includes pulmonary infection and pulmonary edema. Pulmonary hemorrhage also a possibility.    Assessment and Plan:54 yo male seen in follow up for BOOP. Patient doing well on current dose. No exacerbations of SOB, no signs of infection at this time. 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 Today we continued Prednisone to 20mg  for the next 2 weeks, and then 15mg  daily until follow up.  Plan: - continue with Prednisone Prednisone to 20mg  for the next 2 weeks, and then 15mg  daily until follow up. Patient is advised if symptoms return, and is persistent for 3-4 days, then increase prednisone to 20 mg daily until follow-up visit - If there is another relapse of BOOP, will have to consider another bronchoscopy to rule out atypical infections exacerbating BOOP. - continue with 3L O2 with exertion and with sleep - Today we also discussed the possibility of bronchoscopy if unable to wean steroids and possibly referral to [facility] clinic if in the next 2-3 months unable to wean steroids with a negative follow-up bronchoscopy - Bactrim for PJP prophylaxis while on steriods-- 1 DS tab on Tu/Th/Sat until wean down to 10mg  prednisone.

## 2015-12-04 NOTE — Addendum Note (Signed)
Addended by: Maxwell Marion A on: 12/04/2015 09:36 AM   Modules accepted: Orders

## 2015-12-04 NOTE — Assessment & Plan Note (Addendum)
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 Today we continued Prednisone to  for the next 2 weeks, and then  daily until follow up.  Plan: - continue with Prednisone Prednisone to  for the next 2 weeks, and then  daily until follow up. Patient is advised if symptoms return, and is persistent for 3-4 days, then increase prednisone to 20 mg daily until follow-up visit - If there is another relapse of BOOP, will have to consider another bronchoscopy to rule out atypical infections exacerbating BOOP. - continue with 3L O2 with exertion and with sleep - Today we also discussed the possibility of bronchoscopy if unable to wean steroids and possibly referral to [facility] clinic if in the next 2-3 months unable to wean steroids with a negative follow-up bronchoscopy - Bactrim for PJP prophylaxis while on steriods-- 1 DS tab on Tu/Th/Sat until wean down to  prednisone.

## 2015-12-04 NOTE — Assessment & Plan Note (Signed)
Volume up today. Patient is going back for dialysis tomorrow to pull more off.

## 2015-12-04 NOTE — Assessment & Plan Note (Signed)
Stable. Continue Norvasc, Lasix, metoprolol, Valsartan.

## 2015-12-04 NOTE — Assessment & Plan Note (Signed)
Well controlled. Continue lipitor.  

## 2015-12-04 NOTE — Assessment & Plan Note (Signed)
New problem. Exam unremarkable today.  Patient is at high risk for developing PNA given BOOP and chronic steroid use. Advised patient to contact me or Dr. Dema Severin if he worsens.

## 2015-12-04 NOTE — Progress Notes (Signed)
Subjective:  Patient ID: Ryan Arnold, male    DOB: 1961-03-09  Age: 55 y.o. MRN: [id number]  CC: Follow up; runny nose/cough  HPI:  55 year old male with a complicated past medical history including Chronic diastolic heart failure, hypertension, hyperlipidemia, type 1 diabetes, CKD on hemodialysis, BOOP presents for follow up.  HTN  Stable on amlodipine, Lasix, metoprolol, valsartan.  HLD  Well controlled on Lipitor.  BOOP; Runny nose/cough  Patient closely followed by pulmonology. Remains on chronic steroids. These are slowly being titrated down.  For the past few days and expressing runny nose and cough. No increasing shortness of breath.  No associated fevers or chills.  No exacerbating or relieving factors.  He is compliant with his steroid regimen.  DM  Uncontrolled.  Followed by Endocrine.  Complicated by chronic steroid use.  Social Hx   Social History   Social History  . Marital Status: Married    Spouse Name: N/A  . Number of Children: N/A  . Years of Education: N/A   Social History Main Topics  . Smoking status: Never Smoker   . Smokeless tobacco: Never Used  . Alcohol Use: No  . Drug Use: No  . Sexual Activity: Not Asked   Other Topics Concern  . None   Social History Narrative   Retired Emergency planning/management officer.  Does not routinely exercise.   Review of Systems  Constitutional: Negative for fever.  HENT: Positive for rhinorrhea.   Respiratory: Positive for cough.    Objective:  BP 142/70 mmHg  Pulse 88  Temp(Src) 98.2 F (36.8 C) (Oral)  Ht  (1.854 m)  Wt 320 lb (145.151 kg)  BMI 42.23 kg/m2  SpO2 96%  BP/Weight 12/04/2015 12/03/2015 10/30/2015  Systolic BP 144 142 132  Diastolic BP 78 70 80  Wt. (Lbs) 323 320 318  BMI 42.62 42.23 41.96   Physical Exam  Constitutional: He is oriented to person, place, and time. He appears well-developed. No distress.  Morbidly obese. Alvo O2 in place.   HENT:  Head: Normocephalic and  atraumatic.  Eyes: Conjunctivae are normal. No scleral icterus.  Cardiovascular: Normal rate and regular rhythm.   2-3+ LE edema bilaterally.  Pulmonary/Chest: Effort normal.  No wheezing, rales, rhonchi.  Neurological: He is alert and oriented to person, place, and time.  Psychiatric: He has a normal mood and affect.  Vitals reviewed.  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 Followed by Endocrine.  Difficulty controlling in setting of steroid use. Advised to follow up closely with Endo.      CKD (chronic kidney disease) stage V requiring chronic dialysis (HCC)    Volume up today. Patient is going back for dialysis tomorrow to pull more off.  Hyperlipidemia    Well controlled.  Continue lipitor.       Essential hypertension - Primary    Stable. Continue Norvasc, Lasix, metoprolol, Valsartan.      Viral respiratory illness    New problem. Exam unremarkable today.  Patient is at high risk for developing PNA given BOOP and chronic steroid use. Advised patient to contact me or Dr. Dema Severin if he worsens.         Follow-up: Return in about 6 months (around 06/01/2016).  Everlene Other DO [facility]

## 2015-12-05 ENCOUNTER — Ambulatory Visit
Admission: RE | Admit: 2015-12-05 | Discharge: 2015-12-05 | Disposition: A | Discharge status: Home / Self Care | Payer: Medicare Other | Source: Ambulatory Visit | Attending: Family Medicine | Admitting: Family Medicine

## 2015-12-05 DIAGNOSIS — M8589 Other specified disorders of bone density and structure, multiple sites: Secondary | ICD-10-CM | POA: Diagnosis not present

## 2015-12-05 DIAGNOSIS — Z13820 Encounter for screening for osteoporosis: Secondary | ICD-10-CM | POA: Insufficient documentation

## 2015-12-05 DIAGNOSIS — Z79899 Other long term (current) drug therapy: Secondary | ICD-10-CM

## 2015-12-05 DIAGNOSIS — M8580 Other specified disorders of bone density and structure, unspecified site: Secondary | ICD-10-CM | POA: Insufficient documentation

## 2015-12-06 ENCOUNTER — Telehealth: Payer: Self-pay | Admitting: Endocrinology

## 2015-12-06 NOTE — Telephone Encounter (Signed)
Pt called in because his insurance is requiring a prior auth and he hasnt been able to get his insulin and needs it asap

## 2015-12-09 ENCOUNTER — Ambulatory Visit
Admission: RE | Admit: 2015-12-09 | Discharge: 2015-12-09 | Disposition: A | Discharge status: Home / Self Care | Payer: Medicare Other | Source: Ambulatory Visit | Attending: Internal Medicine | Admitting: Internal Medicine

## 2015-12-09 DIAGNOSIS — J8489 Other specified interstitial pulmonary diseases: Secondary | ICD-10-CM | POA: Insufficient documentation

## 2015-12-09 DIAGNOSIS — R0602 Shortness of breath: Secondary | ICD-10-CM | POA: Diagnosis not present

## 2015-12-10 ENCOUNTER — Other Ambulatory Visit: Payer: Self-pay | Admitting: *Deleted

## 2015-12-10 ENCOUNTER — Telehealth: Payer: Self-pay | Admitting: *Deleted

## 2015-12-10 MED ORDER — INSULIN DEGLUDEC 100 UNIT/ML ~~LOC~~ SOPN: 40.0000 [IU] | PEN_INJECTOR | Freq: Every day | SUBCUTANEOUS | Status: DC

## 2015-12-10 NOTE — Telephone Encounter (Signed)
I spoke with Ryan Arnold, he said he's not going to use the pump. I did send in Tresiba 40 units qd

## 2015-12-10 NOTE — Telephone Encounter (Signed)
Pt informed of results. Nothing further needed. 

## 2015-12-10 NOTE — Telephone Encounter (Signed)
-----   Message from Stephanie Acre, MD sent at 12/09/2015  4:42 PM EST ----- Regarding: CXR results Please inform patient that his CXR looks normal, no findings of pneumonia or bronchitis.   Thank you

## 2015-12-11 ENCOUNTER — Ambulatory Visit (INDEPENDENT_AMBULATORY_CARE_PROVIDER_SITE_OTHER): Payer: Medicare Other | Admitting: Endocrinology

## 2015-12-11 ENCOUNTER — Ambulatory Visit: Payer: BC Managed Care – PPO | Admitting: Family

## 2015-12-11 ENCOUNTER — Ambulatory Visit: Payer: BC Managed Care – PPO | Admitting: Family Medicine

## 2015-12-11 ENCOUNTER — Encounter: Payer: Self-pay | Admitting: Endocrinology

## 2015-12-11 VITALS — BP 126/82 | HR 80 | Temp 98.4°F | Resp 16 | Ht 73.0 in | Wt 315.2 lb

## 2015-12-11 DIAGNOSIS — E1165 Type 2 diabetes mellitus with hyperglycemia: Secondary | ICD-10-CM | POA: Diagnosis not present

## 2015-12-11 DIAGNOSIS — Z794 Long term (current) use of insulin: Secondary | ICD-10-CM | POA: Diagnosis not present

## 2015-12-11 NOTE — Progress Notes (Signed)
Patient ID: Ryan Arnold, male   DOB: [birth year], 55 y.o.   MRN: [id number]    Chief complaint : Follow up of Type 1 Diabetes  History of Present Illness:          Date of diagnosis: Age 48      Prior history:     He has been on various insulin regimens  since diagnosis.   Was on insulin pump 2 years ago, and then taken off pump during hospitalization and never put back on it.  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  Novolog 22-26-28 before meals +/- correction doses  Recent history:  He has been on prednisone since 6/16 for his pulmonary condition and blood sugars have been  higher  Currently is taking 20 mg and the dose has been lowered  Current blood sugar patterns, problems and treatment regimen:  He was supposed to be taking 6 units of Lantus in the evening but he is taking only 1 shot in the morning  HIGHEST blood sugars are still late in the evening and he refuses to consider taking his Novolog before eating when he goes out to eat at a restaurant because of fear of hypoglycemia  He has not been able to take Lantus for the last week as he ran out and blood sugars overall higher.  However fasting readings are still mostly high even before he ran out  Once had a low sugar at 3 AM and not clear how much Humalog he took after supper the night before  He takes his Lantus at variable times in the mornings depending on whether he is going for dialysis were not  He refuses to start the insulin pump as he thinks it is too complicated and he needs a C-peptide checked  Glucose monitoring:  done about 2 times a day         Glucometer: One Touch.      Blood Glucose reading analysis from monitor download as follows:  Mean values apply above for all meters except median for One Touch  PRE-MEAL Fasting Lunch Dinner Bedtime Overall  Glucose range: 151-396  271, 406  240, 526   143-600+    Mean/median:     271+/-157     Symptoms of hypoglycemia: Weakness and shakiness Self-care:   Meal times:  breakfast 5 am on dialysis days otherwise his first meal is at 12 noon, dinner 6 pm  Exercise:.  unable to do any         Most recent dietitian/nurse educator visit : Years ago           Diabetes labs:  Lab Results  Component Value Date   HGBA1C 8.4 10/11/2015   HGBA1C 8.7 06/14/2015   HGBA1C 7.0* 03/15/2015   Lab Results  Component Value Date   LDLCALC 65 06/14/2015   CREATININE 10.65* 09/16/2015        Medication List       This list is accurate as of: 12/11/15  9:19 PM.  Always use your most recent med list.               albuterol 108 (90 Base) MCG/ACT inhaler  Commonly known as:  PROVENTIL HFA  Inhale 2 puffs into the lungs every 4 (four) hours as needed for wheezing or shortness of breath.     allopurinol 100 MG tablet  Commonly known as:  ZYLOPRIM  Take 1 tablet (100 mg total) by mouth daily.     amLODipine 10 MG tablet  Commonly known as:  NORVASC  take 1 tablet by mouth once daily     aspirin 81 MG chewable tablet  Chew 1 tablet (81 mg total) by mouth daily.     atorvastatin 40 MG tablet  Commonly known as:  LIPITOR  Take 1 tablet (40 mg total) by mouth daily.     B-D ULTRAFINE III SHORT PEN 31G X 8 MM Misc  Generic drug:  Insulin Pen Needle  use as directed     DULoxetine 60 MG capsule  Commonly known as:  CYMBALTA  Take 120 mg by mouth daily.     furosemide 80 MG tablet  Commonly known as:  LASIX  Take 1 tablet (80 mg total) by mouth 4 (four) times a week. Tuesday, Thursday, Saturday, and Sunday.     gabapentin 100 MG capsule  Commonly known as:  NEURONTIN  Take 100 mg by mouth daily.     insulin aspart 100 UNIT/ML FlexPen  Commonly known as:  NOVOLOG  Inject 22 Units into the skin 3 (three) times daily with meals.     Insulin Degludec 100 UNIT/ML Sopn  Commonly known as:  TRESIBA FLEXTOUCH  Inject 40 Units into the  skin daily.     LANTUS SOLOSTAR 100 UNIT/ML Solostar Pen  Generic drug:  Insulin Glargine  INJECT 36 UNITS AT BREAKFAST AND 8 UNITS AT BEDTIME     metoprolol 50 MG tablet  Commonly known as:  LOPRESSOR  Take 50 mg by mouth 2 (two) times daily.     multivitamin Tabs tablet  Take 1 tablet by mouth daily.     pantoprazole 40 MG tablet  Commonly known as:  PROTONIX  take 1 tablet by mouth once daily     predniSONE 20 MG tablet  Commonly known as:  DELTASONE  Take 1 tablet (20 mg total) by mouth daily with breakfast.     SENSIPAR 30 MG tablet  Generic drug:  cinacalcet  Take 30 mg by mouth at bedtime.     sevelamer carbonate 800 MG tablet  Commonly known as:  RENVELA  Take 800 mg by mouth 3 (three) times daily. And 2 tablet with snacks     sulfamethoxazole-trimethoprim 800-160 MG tablet  Commonly known as:  BACTRIM DS,SEPTRA DS  Take 1 tablet by mouth 2 (two) times daily.     valsartan 80 MG tablet  Commonly known as:  DIOVAN  Take 80 mg by mouth at bedtime.        Allergies:  Allergies  Allergen Reactions  . Oxycodone-Acetaminophen Nausea And Vomiting    Past Medical History  Diagnosis Date  . Essential hypertension   . Hyperlipemia   . End stage renal disease (HCC)     a. on dialysis; still makes urine.  [name] BOOP (bronchiolitis obliterans with organizing pneumonia) (HCC)     a. 01/2018 s/p R thoracoscopy/Bx confirming BOOP;  b. 02/2018 high dose steroids started.  . Chronic diastolic CHF (congestive heart failure) (HCC)     a. 12/2014 Echo: EF 50-55%, mild conc LVH, mildly dil LA.  . Morbid obesity (HCC)   . Secondary hyperparathyroidism (HCC)   . Anemia of chronic disease   . Recurrent pneumonia     a. 2/2 BOOP.  [name] Chest pain     a. 06/2014 Myoview ([facility]) no ischemia/infarct, nl EF.  . Type 1 diabetes mellitus (HCC)   .  Thyroid disease   . OSA treated with BiPAP     Past Surgical History  Procedure Laterality Date  . Cataract Bilateral   . Shoulder  surgery Left   . Knee surgery Right   . Gallbladder surgery    . Lung biopsy      Family History  Problem Relation Age of Onset  . Diabetes Mother     died @ 50.  [name] Hypertension Mother   . Hypertension Maternal Grandmother     Social History:  reports that he has never smoked. He has never used smokeless tobacco. He reports that he does not drink alcohol or use illicit drugs.    Review of Systems:   CKD: He is on dialysis, goes on Mondays, Wednesdays and Fridays  He is on steroids for bronchiolitis obliterans and will need to be continued on prednisone He takes prednisone 20 mg in the morning and will be on the same dose still 3/16  He [facility] for leg ulcers  Lipids: Better controlled on his last labs He thinks he is compliant with his Lipitor  Lab Results  Component Value Date   CHOL 194 06/14/2015   HDL 99.30 06/14/2015   LDLCALC 65 06/14/2015   TRIG 151.0* 06/14/2015   CHOLHDL 2 06/14/2015    Diabetes complications: Nephropathy   Physical Examination:  BP 126/82 mmHg  Pulse 80  Temp(Src) 98.4 F (36.9 C)  Resp 16  Ht  (1.854 m)  Wt 315 lb 3.2 oz (142.974 kg)  BMI 41.59 kg/m2  SpO2 96%         ASSESSMENT: See history of present illness for detailed discussion of his current management, blood sugar patterns and problems identified  Diabetes type 1 with consistently poor control  He has difficulty complying with instructions for his insulin regimen especially mealtime doses Also not complying with glucose monitoring enough before and after lunch and dinner meals He has been recommended insulin pump again for better compliance but he is somewhat unmotivated to do this at this time  Problems identified:  He is getting more high readings in the mornings partly because of running out of Lantus and also not taking the evening dose as described on the last visit  Blood sugars after supper are very high usually from his eating out  and not taking insulin to cover the meal  Also discussed that he may tend to get hypoglycemia overnight if he takes mealtime insulin later at night after eating  He may need more basal insulin during the day but not clear if he has high readings after lunch consistently  Also discussed that he may need more insulin at lunch because of covering afternoon hyperglycemia related to prednisone  PLAN:   He will start [place] tomorrow morning starting with at least 44 units.  Given flowsheet for titration every 4-5 days by 2 units to get morning sugars under 150  He will take his Novolog with him to the restaurant and take it right before eating  More blood sugars in the afternoon and consider increasing lunchtime coverage  Consider continuous glucose monitoring if not able to establish blood sugar patterns on the next visit  Patient Instructions  Tresiba 44 units in am and every 4-5 days go up to get sugar < 150  Must take Novolog BEFORE ALL MEALS, KEEP SUPPER SUGAR <150 ALSO  Check blood sugars on waking up 7  times a week Also check blood sugars about 2 hours  after a meal and do this after different meals by rotation  Recommended blood sugar levels on waking up is 90-130 and about 2 hours after meal is 130-160  Please bring your blood sugar monitor to each visit, thank you   Counseling time on subjects discussed above is over 50% of today's 25 minute visit     Holleigh Crihfield 12/11/2015, 9:19 PM

## 2015-12-11 NOTE — Patient Instructions (Addendum)
Tresiba 44 units in am and every 4-5 days go up to get sugar < 150  Must take Novolog BEFORE ALL MEALS, KEEP SUPPER SUGAR <150 ALSO  Check blood sugars on waking up 7  times a week Also check blood sugars about 2 hours after a meal and do this after different meals by rotation  Recommended blood sugar levels on waking up is 90-130 and about 2 hours after meal is 130-160  Please bring your blood sugar monitor to each visit, thank you

## 2015-12-12 DIAGNOSIS — N186 End stage renal disease: Secondary | ICD-10-CM | POA: Diagnosis not present

## 2015-12-12 DIAGNOSIS — E8770 Fluid overload, unspecified: Secondary | ICD-10-CM | POA: Diagnosis not present

## 2015-12-12 DIAGNOSIS — D509 Iron deficiency anemia, unspecified: Secondary | ICD-10-CM | POA: Diagnosis not present

## 2015-12-12 DIAGNOSIS — Z992 Dependence on renal dialysis: Secondary | ICD-10-CM | POA: Diagnosis not present

## 2015-12-12 DIAGNOSIS — D631 Anemia in chronic kidney disease: Secondary | ICD-10-CM | POA: Diagnosis not present

## 2015-12-12 DIAGNOSIS — N2581 Secondary hyperparathyroidism of renal origin: Secondary | ICD-10-CM | POA: Diagnosis not present

## 2015-12-20 ENCOUNTER — Encounter: Payer: Self-pay | Admitting: Family

## 2015-12-20 ENCOUNTER — Ambulatory Visit: Payer: Medicare Other | Attending: Family | Admitting: Family

## 2015-12-20 VITALS — BP 127/62 | HR 84 | Resp 18 | Ht 73.0 in | Wt 314.0 lb

## 2015-12-20 DIAGNOSIS — E109 Type 1 diabetes mellitus without complications: Secondary | ICD-10-CM | POA: Diagnosis not present

## 2015-12-20 DIAGNOSIS — R0602 Shortness of breath: Secondary | ICD-10-CM | POA: Diagnosis not present

## 2015-12-20 DIAGNOSIS — Z7982 Long term (current) use of aspirin: Secondary | ICD-10-CM | POA: Diagnosis not present

## 2015-12-20 DIAGNOSIS — E079 Disorder of thyroid, unspecified: Secondary | ICD-10-CM | POA: Diagnosis not present

## 2015-12-20 DIAGNOSIS — I5032 Chronic diastolic (congestive) heart failure: Secondary | ICD-10-CM | POA: Diagnosis not present

## 2015-12-20 DIAGNOSIS — E1122 Type 2 diabetes mellitus with diabetic chronic kidney disease: Secondary | ICD-10-CM | POA: Insufficient documentation

## 2015-12-20 DIAGNOSIS — E108 Type 1 diabetes mellitus with unspecified complications: Secondary | ICD-10-CM

## 2015-12-20 DIAGNOSIS — I120 Hypertensive chronic kidney disease with stage 5 chronic kidney disease or end stage renal disease: Secondary | ICD-10-CM | POA: Insufficient documentation

## 2015-12-20 DIAGNOSIS — I2510 Atherosclerotic heart disease of native coronary artery without angina pectoris: Secondary | ICD-10-CM | POA: Diagnosis not present

## 2015-12-20 DIAGNOSIS — I10 Essential (primary) hypertension: Secondary | ICD-10-CM

## 2015-12-20 DIAGNOSIS — G4733 Obstructive sleep apnea (adult) (pediatric): Secondary | ICD-10-CM | POA: Insufficient documentation

## 2015-12-20 DIAGNOSIS — Z992 Dependence on renal dialysis: Secondary | ICD-10-CM | POA: Diagnosis not present

## 2015-12-20 DIAGNOSIS — E785 Hyperlipidemia, unspecified: Secondary | ICD-10-CM | POA: Insufficient documentation

## 2015-12-20 DIAGNOSIS — Z9889 Other specified postprocedural states: Secondary | ICD-10-CM | POA: Diagnosis not present

## 2015-12-20 DIAGNOSIS — J8489 Other specified interstitial pulmonary diseases: Secondary | ICD-10-CM

## 2015-12-20 DIAGNOSIS — N2581 Secondary hyperparathyroidism of renal origin: Secondary | ICD-10-CM | POA: Insufficient documentation

## 2015-12-20 DIAGNOSIS — Z794 Long term (current) use of insulin: Secondary | ICD-10-CM | POA: Diagnosis not present

## 2015-12-20 DIAGNOSIS — N186 End stage renal disease: Secondary | ICD-10-CM | POA: Diagnosis not present

## 2015-12-20 DIAGNOSIS — Z888 Allergy status to other drugs, medicaments and biological substances status: Secondary | ICD-10-CM | POA: Diagnosis not present

## 2015-12-20 DIAGNOSIS — Z79899 Other long term (current) drug therapy: Secondary | ICD-10-CM | POA: Insufficient documentation

## 2015-12-20 NOTE — Patient Instructions (Signed)
Continue weighing daily and call for an overnight weight gain of > 2 pounds or a weekly weight gain of >5 pounds. 

## 2015-12-20 NOTE — Progress Notes (Signed)
Subjective:    Patient ID: Ryan Arnold, male    DOB: 03/27/1961, 55 y.o.   MRN: [id number]  Congestive Heart FailuKarsten Fellsre Presents for follow-up visit. The disease course has been stable. Associated symptoms include edema, fatigue and shortness of breath. Pertinent negatives include no abdominal pain, chest pain, chest pressure, muscle weakness, orthopnea or palpitations. The symptoms have been stable. Past treatments include beta blockers, angiotensin receptor blockers, oxygen and salt and fluid restriction. The treatment provided moderate relief. Compliance with prior treatments has been good. His past medical history is significant for anemia, chronic lung disease, DM and HTN.  Hypertension This is a chronic problem. The current episode started more than 1 year ago. The problem is controlled. Associated symptoms include peripheral edema and shortness of breath. Pertinent negatives include no chest pain, headaches, neck pain or palpitations. Agents associated with hypertension include steroids. Risk factors for coronary artery disease include diabetes mellitus, obesity and male gender. Past treatments include angiotensin blockers, beta blockers, diuretics, lifestyle changes and calcium channel blockers. The current treatment provides moderate improvement. Compliance problems include exercise.  Hypertensive end-organ damage includes kidney disease and heart failure.   Past Medical History  Diagnosis Date  . Essential hypertension   . Hyperlipemia   . End stage renal disease (HCC)     a. on dialysis; still makes urine.  [name]. BOOP (bronchiolitis obliterans with organizing pneumonia) (HCC)     a. 01/2018 s/p R thoracoscopy/Bx confirming BOOP;  b. 02/2018 high dose steroids started.  . Chronic diastolic CHF (congestive heart failure) (HCC)     a. 12/2014 Echo: EF 50-55%, mild conc LVH, mildly dil LA.  . Morbid obesity (HCC)   . Secondary hyperparathyroidism (HCC)   . Anemia of chronic disease   .  Recurrent pneumonia     a. 2/2 BOOP.  [name]. Chest pain     a. 06/2014 Myoview ([facility]) no ischemia/infarct, nl EF.  . Type 1 diabetes mellitus (HCC)   . Thyroid disease   . OSA treated with BiPAP     Past Surgical History  Procedure Laterality Date  . Cataract Bilateral   . Shoulder surgery Left   . Knee surgery Right   . Gallbladder surgery    . Lung biopsy      Family History  Problem Relation Age of Onset  . Diabetes Mother     died @ 962.  [name]. Hypertension Mother   . Hypertension Maternal Grandmother     Social History  Substance Use Topics  . Smoking status: Never Smoker   . Smokeless tobacco: Never Used  . Alcohol Use: No    Allergies  Allergen Reactions  . Oxycodone-Acetaminophen Nausea And Vomiting    Prior to Admission medications   Medication Sig Start Date End Date Taking? Authorizing Provider  albuterol (PROVENTIL HFA) 108 (90 BASE) MCG/ACT inhaler Inhale 2 puffs into the lungs every 4 (four) hours as needed for wheezing or shortness of breath. 09/16/15  Yes Sharman CheekPhillip Stafford, MD  allopurinol (ZYLOPRIM) 100 MG tablet Take 1 tablet (100 mg total) by mouth daily. 04/11/15  Yes Delma Freezeina A Hackney, FNP  amLODipine (NORVASC) 10 MG tablet take 1 tablet by mouth once daily 09/11/15  Yes Tommie SamsJayce G Cook, DO  aspirin 81 MG chewable tablet Chew 1 tablet (81 mg total) by mouth daily. 03/21/15  Yes Enid Baasadhika Kalisetti, MD  atorvastatin (LIPITOR) 40 MG tablet Take 1 tablet (40 mg total) by mouth daily. Patient taking differently: Take 20 mg by mouth at  bedtime.  06/05/15  Yes Tommie Sams, DO  B-D ULTRAFINE III SHORT PEN 31G X 8 MM MISC use as directed 06/07/15  Yes Jayce G Cook, DO  DULoxetine (CYMBALTA) 60 MG capsule Take 120 mg by mouth daily. 01/02/15  Yes Historical Provider, MD  furosemide (LASIX) 80 MG tablet Take 1 tablet (80 mg total) by mouth 4 (four) times a week. Tuesday, Thursday, Saturday, and Sunday. 04/11/15  Yes Delma Freeze, FNP  gabapentin (NEURONTIN) 100 MG capsule Take 100  mg by mouth daily. 12/26/14  Yes Historical Provider, MD  insulin aspart (NOVOLOG) 100 UNIT/ML FlexPen Inject 22 Units into the skin 3 (three) times daily with meals. 06/05/15  Yes Reather Littler, MD  Insulin Degludec (TRESIBA FLEXTOUCH) 100 UNIT/ML SOPN Inject 40 Units into the skin daily. Patient taking differently: Inject 40 Units into the skin daily. 6 units in the evening 12/10/15  Yes Reather Littler, MD  metoprolol (LOPRESSOR) 50 MG tablet Take 50 mg by mouth 2 (two) times daily. 12/23/14  Yes Historical Provider, MD  multivitamin (RENA-VIT) TABS tablet Take 1 tablet by mouth daily. 01/09/14  Yes Historical Provider, MD  pantoprazole (PROTONIX) 40 MG tablet take 1 tablet by mouth once daily 06/21/15  Yes Jayce G Cook, DO  predniSONE (DELTASONE) 20 MG tablet Take 1 tablet (20 mg total) by mouth daily with breakfast. Patient taking differently: Take 15 mg by mouth daily with breakfast.  10/30/15  Yes [name] Mungal, MD  SENSIPAR 30 MG tablet Take 30 mg by mouth at bedtime.  12/18/14  Yes Historical Provider, MD  sevelamer carbonate (RENVELA) 800 MG tablet Take 800 mg by mouth 3 (three) times daily. And 2 tablet with snacks   Yes Historical Provider, MD  sulfamethoxazole-trimethoprim (BACTRIM DS,SEPTRA DS) 800-160 MG tablet Take 1 tablet by mouth 2 (two) times daily. 12/04/15  Yes [name] Mungal, MD  valsartan (DIOVAN) 80 MG tablet Take 80 mg by mouth at bedtime. 05/29/15  Yes Historical Provider, MD      Review of Systems  Constitutional: Positive for appetite change (comes and goes) and fatigue.  HENT: Negative for congestion, postnasal drip and sore throat.   Eyes: Negative.   Respiratory: Positive for cough and shortness of breath. Negative for chest tightness and wheezing.   Cardiovascular: Positive for leg swelling. Negative for chest pain and palpitations.  Gastrointestinal: Negative for abdominal pain and abdominal distention.  Endocrine: Negative.   Genitourinary: Negative.   Musculoskeletal:  Negative for back pain, muscle weakness and neck pain.  Skin: Negative.   Allergic/Immunologic: Negative.   Neurological: Negative for dizziness, light-headedness and headaches.  Hematological: Negative for adenopathy. Does not bruise/bleed easily.  Psychiatric/Behavioral: Negative for sleep disturbance (sleeping on 2 pillows. oxygen on 3L [place] as needed along with bipap at night) and dysphoric mood. The patient is not nervous/anxious.        Objective:   Physical Exam  Constitutional: He is oriented to person, place, and time. He appears well-developed and well-nourished.  HENT:  Head: Normocephalic and atraumatic.  Eyes: Conjunctivae are normal. Pupils are equal, round, and reactive to light.  Neck: Normal range of motion. Neck supple.  Cardiovascular: Normal rate and regular rhythm.   Pulmonary/Chest: Effort normal. He has no wheezes. He has no rales.  Abdominal: Soft. He exhibits no distension. There is no tenderness.  Musculoskeletal: He exhibits edema (1+ pitting edema in bilateral lower legs). He exhibits no tenderness.  Neurological: He is alert and oriented to person, place, and time.  Skin: Skin is warm and dry.  Psychiatric: He has a normal mood and affect. His behavior is normal. Thought content normal.  Nursing note and vitals reviewed.   BP 127/62 mmHg  Pulse 84  Resp 18  Ht  (1.854 m)  Wt 314 lb (142.429 kg)  BMI 41.44 kg/m2  SpO2 100%       Assessment & Plan:  1: Chronic heart failure with preserved ejection fraction- Patient presents with fatigue and shortness of breath upon exertion. He says that he was a little winded upon walking into the office but once he sat down, his breathing improved pretty quickly. He continues to have some swelling in his lower legs but does elevate them some during the day. He continues to weigh himself daily and reports a gradual weight gain. By our scale, he's gained 10 pounds since October 2016. Reminded him to call for an  overnight weight gain of >2 pounds or a weekly weight gain of >5 pounds. He is not adding any salt to his food and tries to eat low sodium foods if possible.  2: HTN- Blood pressure looks good today. Continue medications at this time. 3: Chronic kidney disease with dialysis- He normally gets dialysis on M, W, F but he had an extra session today to try and pull off more fluid. He says that every few weeks he's been needing extra dialysis sessions. He does report quite a bit of cramping afterwards. 4: BOOP- Has oxygen that he wears at 3L [place] upon exertion but he says that he doesn't wear it all the time at home. Does wear it at bedtime through his bipap. Prednisone is down to  daily. Sees his pulmonologist 01/22/16. 5: Diabetes- Reports extreme fluctuations in his glucose levels. He says that some mornings it'll be in the 400's and other mornings it'll be in the 100's. Has had an insulin change due to insurance and he follows closely with his endocrinologist. Has a follow-up appointment on 01/10/16.  Return here in 4 months or sooner for any questions/problems before then.

## 2015-12-25 ENCOUNTER — Telehealth: Payer: Self-pay | Admitting: Internal Medicine

## 2015-12-25 NOTE — Telephone Encounter (Signed)
LVM for pt to return call

## 2015-12-26 MED ORDER — PREDNISOLONE 5 MG PO TABS: 15.0000 mg | ORAL_TABLET | Freq: Every day | ORAL | Status: DC

## 2015-12-26 NOTE — Telephone Encounter (Signed)
Spoke with the pt and verified the msg  Rx for pred was sent to pharm Nothing further needed

## 2015-12-27 ENCOUNTER — Other Ambulatory Visit: Payer: Self-pay | Admitting: Family

## 2015-12-27 ENCOUNTER — Telehealth: Payer: Self-pay | Admitting: Internal Medicine

## 2015-12-27 MED ORDER — PREDNISONE 5 MG PO TABS: 15.0000 mg | ORAL_TABLET | Freq: Every day | ORAL | Status: DC

## 2015-12-27 NOTE — Telephone Encounter (Signed)
Called spoke with pt. Rx sent in yesterday was for prednisolone 5 mg and not prednisone. I have sent in correct RX. Nothing further needed

## 2016-01-02 ENCOUNTER — Other Ambulatory Visit: Payer: Self-pay | Admitting: Endocrinology

## 2016-01-10 ENCOUNTER — Ambulatory Visit: Payer: BC Managed Care – PPO | Admitting: Endocrinology

## 2016-01-10 DIAGNOSIS — F3342 Major depressive disorder, recurrent, in full remission: Secondary | ICD-10-CM | POA: Diagnosis not present

## 2016-01-10 DIAGNOSIS — Z600 Problems of adjustment to life-cycle transitions: Secondary | ICD-10-CM | POA: Diagnosis not present

## 2016-01-10 DIAGNOSIS — F419 Anxiety disorder, unspecified: Secondary | ICD-10-CM | POA: Diagnosis not present

## 2016-01-14 DIAGNOSIS — D509 Iron deficiency anemia, unspecified: Secondary | ICD-10-CM | POA: Diagnosis not present

## 2016-01-14 DIAGNOSIS — N2581 Secondary hyperparathyroidism of renal origin: Secondary | ICD-10-CM | POA: Diagnosis not present

## 2016-01-14 DIAGNOSIS — N186 End stage renal disease: Secondary | ICD-10-CM | POA: Diagnosis not present

## 2016-01-14 DIAGNOSIS — Z992 Dependence on renal dialysis: Secondary | ICD-10-CM | POA: Diagnosis not present

## 2016-01-14 DIAGNOSIS — D631 Anemia in chronic kidney disease: Secondary | ICD-10-CM | POA: Diagnosis not present

## 2016-01-14 DIAGNOSIS — E8779 Other fluid overload: Secondary | ICD-10-CM | POA: Diagnosis not present

## 2016-01-17 ENCOUNTER — Ambulatory Visit (INDEPENDENT_AMBULATORY_CARE_PROVIDER_SITE_OTHER): Payer: Medicare Other | Admitting: Endocrinology

## 2016-01-17 VITALS — BP 130/74 | HR 82 | Temp 98.3°F | Resp 14 | Ht 73.0 in | Wt 320.0 lb

## 2016-01-17 DIAGNOSIS — E108 Type 1 diabetes mellitus with unspecified complications: Secondary | ICD-10-CM

## 2016-01-17 DIAGNOSIS — E1065 Type 1 diabetes mellitus with hyperglycemia: Secondary | ICD-10-CM

## 2016-01-17 LAB — POCT GLYCOSYLATED HEMOGLOBIN (HGB A1C): HEMOGLOBIN A1C: 8.4

## 2016-01-17 NOTE — Patient Instructions (Addendum)
Tresiba 46 units in am daily and if am sugar stays > 200 in 5 days then go 50 units  NOVOLOG BASE AMOUNT 30-34-38 BEFORE MEALS plus high sugar correction  Protein snack at bedtime   Call sugars in 1 week

## 2016-01-17 NOTE — Progress Notes (Signed)
Patient ID: Ryan Arnold, male   DOB: [birth year], 55 y.o.   MRN: [id number]    Chief complaint : Follow up of Type 1 Diabetes  History of Present Illness:          Date of diagnosis: Age 55      Prior history:     He has been on various insulin regimens  since diagnosis.   Was on insulin pump 2 years ago, and then taken off pump during hospitalization and never put back on it.  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, and found it slightly more convenient at that time  INSULIN regimen: Tresiba 40--6 Novolog 22-26-28 before meals +/- correction doses  Recent history:   He has been on prednisone since 6/16 for his pulmonary condition and blood sugars have been consistently high  Currently is taking 15 mg and the dose has been lowered  Current management, blood sugar patterns, problems identified:  He was supposed to be taking 44 units of Tresiba once a day but he is taking 40 in the morning and 6 in the evening on his own  Also he says he ran out of his [place] in the last 2 weeks of March and blood sugars started going up  His blood sugars are still markedly increased the last few days  HIGHEST blood sugars are still late in the evening.  This is despite his trying to take Novolog before he goes out to eat instead of after  However postprandial readings appear to be high after all of his meals now  Eating out every evening and not always restricting high-fat foods  Not clear why he has occasional near normal readings on the days he is waking up for dialysis at about 3:30 AM whereas his readings around 8 AM are mostly high  He may take some insulin late at night for high readings but no more than 10-15 units  Dialysis days: Monday/Wednesday/Friday  He refuses to start the insulin pump as he thinks it is too complicated and he needs a C-peptide checked  Glucose monitoring:  done about 2 times a day         Glucometer: One Touch.      Blood Glucose reading  analysis from monitor download as follows:  Mean values apply above for all meters except median for One Touch  PRE-MEAL Fasting Lunch Dinner Bedtime Overall  Glucose range: 80-338  339-481  109-552     Mean/median:     256+/-142    POST-MEAL PC Breakfast PC Lunch PC Dinner  Glucose range: 167-336   383-601   Mean/median:      Symptoms of hypoglycemia: Weakness and shakiness Self-care:   Meal times:  breakfast 5 am on dialysis days otherwise his first meal is at 12 noon, dinner 6-7 pm  Exercise:.  unable to do any         Most recent dietitian/nurse educator visit : Years ago           Diabetes labs:  Lab Results  Component Value Date   HGBA1C 8.4 01/17/2016   HGBA1C 8.4 10/11/2015   HGBA1C 8.7 06/14/2015   Lab Results  Component Value Date   LDLCALC 65 06/14/2015   CREATININE 10.65* 09/16/2015        Medication List       This list is accurate as of: 01/17/16  9:11 PM.  Always use your most recent med list.  albuterol 108 (90 Base) MCG/ACT inhaler  Commonly known as:  PROVENTIL HFA  Inhale 2 puffs into the lungs every 4 (four) hours as needed for wheezing or shortness of breath.     allopurinol 100 MG tablet  Commonly known as:  ZYLOPRIM  Take 1 tablet (100 mg total) by mouth daily.     amLODipine 10 MG tablet  Commonly known as:  NORVASC  take 1 tablet by mouth once daily     aspirin 81 MG chewable tablet  Chew 1 tablet (81 mg total) by mouth daily.     atorvastatin 40 MG tablet  Commonly known as:  LIPITOR  Take 1 tablet (40 mg total) by mouth daily.     B-D ULTRAFINE III SHORT PEN 31G X 8 MM Misc  Generic drug:  Insulin Pen Needle  use as directed     DULoxetine 60 MG capsule  Commonly known as:  CYMBALTA  Take 120 mg by mouth daily.     furosemide 80 MG tablet  Commonly known as:  LASIX  take 1 tablet by mouth 4 TIMES  A WEEK ,TUESDAY ,THURSDAY ,SATURDAY AND SUNDAY     gabapentin 100 MG capsule  Commonly known as:  NEURONTIN    Take 100 mg by mouth daily.     Insulin Degludec 100 UNIT/ML Sopn  Commonly known as:  TRESIBA FLEXTOUCH  Inject 40 Units into the skin daily.     metoprolol 50 MG tablet  Commonly known as:  LOPRESSOR  Take 50 mg by mouth 2 (two) times daily.     multivitamin Tabs tablet  Take 1 tablet by mouth daily.     NOVOLOG FLEXPEN 100 UNIT/ML FlexPen  Generic drug:  insulin aspart  INJECT 22 UNITS INTO THE SKIN THREE TIMES DAILY WITH MEALS     pantoprazole 40 MG tablet  Commonly known as:  PROTONIX  take 1 tablet by mouth once daily     predniSONE 5 MG tablet  Commonly known as:  DELTASONE  Take 3 tablets (15 mg total) by mouth daily with breakfast.     SENSIPAR 30 MG tablet  Generic drug:  cinacalcet  Take 30 mg by mouth at bedtime.     sevelamer carbonate 800 MG tablet  Commonly known as:  RENVELA  Take 800 mg by mouth 3 (three) times daily. And 2 tablet with snacks     sulfamethoxazole-trimethoprim 800-160 MG tablet  Commonly known as:  BACTRIM DS,SEPTRA DS  Take 1 tablet by mouth 2 (two) times daily.     valsartan 80 MG tablet  Commonly known as:  DIOVAN  Take 80 mg by mouth at bedtime.        Allergies:  Allergies  Allergen Reactions  . Oxycodone-Acetaminophen Nausea And Vomiting    Past Medical History  Diagnosis Date  . Essential hypertension   . Hyperlipemia   . End stage renal disease (HCC)     a. on dialysis; still makes urine.  [name]. BOOP (bronchiolitis obliterans with organizing pneumonia) (HCC)     a. 01/2018 s/p R thoracoscopy/Bx confirming BOOP;  b. 02/2018 high dose steroids started.  . Chronic diastolic CHF (congestive heart failure) (HCC)     a. 12/2014 Echo: EF 50-55%, mild conc LVH, mildly dil LA.  . Morbid obesity (HCC)   . Secondary hyperparathyroidism (HCC)   . Anemia of chronic disease   . Recurrent pneumonia     a. 2/2 BOOP.  [name]. Chest pain     a.  06/2014 Myoview ([facility]) no ischemia/infarct, nl EF.  . Type 1 diabetes mellitus (HCC)   .  Thyroid disease   . OSA treated with BiPAP     Past Surgical History  Procedure Laterality Date  . Cataract Bilateral   . Shoulder surgery Left   . Knee surgery Right   . Gallbladder surgery    . Lung biopsy      Family History  Problem Relation Age of Onset  . Diabetes Mother     died @ 59.  [name] Hypertension Mother   . Hypertension Maternal Grandmother     Social History:  reports that he has never smoked. He has never used smokeless tobacco. He reports that he does not drink alcohol or use illicit drugs.    Review of Systems:   CKD: He is on dialysis, goes on Mondays, Wednesdays and Fridays  He is on steroids for bronchiolitis obliterans and will need to be continued on prednisone   Lipids: Better controlled on his last labs He thinks he is compliant with his Lipitor  Lab Results  Component Value Date   CHOL 194 06/14/2015   HDL 99.30 06/14/2015   LDLCALC 65 06/14/2015   TRIG 151.0* 06/14/2015   CHOLHDL 2 06/14/2015    Diabetes complications: Nephropathy  Last Eye Exam was in 6/16   Physical Examination:  BP 130/74 mmHg  Pulse 82  Temp(Src) 98.3 F (36.8 C)  Resp 14  Ht  (1.854 m)  Wt 320 lb (145.151 kg)  BMI 42.23 kg/m2  SpO2 99%         ASSESSMENT: See history of present illness for detailed discussion of his current management, blood sugar patterns and problems identified  Diabetes type 1 with consistently poor control A1c 8.4 now   He has not seen any better blood sugar control with switching Lantus to [place] However he did not take it for up to 2 weeks last month when he ran out and did not have any basal insulin Recently he does have some early morning readings that are normal Not clear if some of his high fasting readings are related to high-fat meals at night Also he has very significant POSTPRANDIAL hyperglycemia now at all times for no apparent reason  He has been recommended insulin pump again for better compliance but he is  refusing to do that   PLAN:   He will start [place] only once a day in the morning using 46 units  Next week if he is waking up with high readings he will increase to 50 units  May have a bedtime snack but some protein to avoid overnight hypoglycemia  Increase Novolog by 8-10 units at every meal  Low-fat intake  He will call blood sugar readings in one week for further adjustment  If blood sugars continue to be high will switch him to U-500 insulin, currently has a supply of his insulins  Will change his prescription for Tresiba to help him get a 30 day supply consistently  Consider follow-up with nurse educator  Patient Instructions  Tresiba 46 units in am daily and if am sugar stays > 200 in 5 days then go 50 units  NOVOLOG BASE AMOUNT 30-34-38 BEFORE MEALS plus high sugar correction  Protein snack at bedtime   Call sugars in 1 week       Counseling time on subjects discussed above is over 50% of today's 25 minute visit     Ambry Dix 01/17/2016, 9:11 PM

## 2016-01-21 ENCOUNTER — Other Ambulatory Visit: Payer: Self-pay

## 2016-01-21 DIAGNOSIS — J189 Pneumonia, unspecified organism: Secondary | ICD-10-CM

## 2016-01-21 DIAGNOSIS — J8489 Other specified interstitial pulmonary diseases: Secondary | ICD-10-CM

## 2016-01-22 ENCOUNTER — Ambulatory Visit
Admission: RE | Admit: 2016-01-22 | Discharge: 2016-01-22 | Disposition: A | Discharge status: Home / Self Care | Payer: Medicare Other | Source: Ambulatory Visit | Attending: Internal Medicine | Admitting: Internal Medicine

## 2016-01-22 ENCOUNTER — Ambulatory Visit (INDEPENDENT_AMBULATORY_CARE_PROVIDER_SITE_OTHER): Payer: Medicare Other | Admitting: Internal Medicine

## 2016-01-22 ENCOUNTER — Encounter: Payer: Self-pay | Admitting: Internal Medicine

## 2016-01-22 VITALS — BP 124/76 | HR 83 | Ht 73.0 in | Wt 323.0 lb

## 2016-01-22 DIAGNOSIS — J8489 Other specified interstitial pulmonary diseases: Secondary | ICD-10-CM

## 2016-01-22 DIAGNOSIS — J189 Pneumonia, unspecified organism: Secondary | ICD-10-CM | POA: Diagnosis not present

## 2016-01-22 DIAGNOSIS — I517 Cardiomegaly: Secondary | ICD-10-CM | POA: Insufficient documentation

## 2016-01-22 MED ORDER — SULFAMETHOXAZOLE-TRIMETHOPRIM 800-160 MG PO TABS: 1.0000 | ORAL_TABLET | Freq: Two times a day (BID) | ORAL | Status: DC

## 2016-01-22 MED ORDER — PREDNISONE 5 MG PO TABS: 15.0000 mg | ORAL_TABLET | Freq: Every day | ORAL | Status: DC

## 2016-01-22 NOTE — Progress Notes (Signed)
MRN# [id number] Ryan Arnold 1961/04/23   CC: Chief Complaint  Patient presents with  . Follow-up    CXR done today: breathing issues come and go; worse most days; chest tightness; prod cough w/clear mucus.   Synopsis: 55 year old male past medical history of end-stage renal disease on dialysis, uncontrolled diabetes, hypertension, former retired Emergency planning/management officer, seen in consultation for recurrent pneumonias. Multiple hospitalizations at [facility] and at [facility] over the past one year for bilateral groundglass opacities throughout entire lung fields requiring multiple rounds of antibiotics and steroids. Had a bronchoscopy done at [facility] and within the last 2 years no acute findings from the BAL. Patient with recent bronchoscopy, March 2016, at [facility] by Dr. Dema Severin, BAL with no acute findings. April 2016 surgical lung bx (wedge) by Dr. Thelma Barge, path confirms BOOP, started on high dose steroids.   Events since last clinic visit: Patient presents today for a BOOP follow up. Currently on Bactrim for PJP prophylaxis while on steriods-- 1 DS BID tab on Tu/Th/Sat until wean down to  prednisone. Overall states that he has good and bad days of breathing, currently down to  of prednisone. Mild non productive intermittent cough, some DOE throughout the week.  Today we also discussed the possibility of bronchoscopy if unable to wean steroids and possibly referral to [facility] clinic if in the next 2-3 months unable to wean steroids with a negative follow-up bronchoscopy     Medication:   Current Outpatient Rx  Name  Route  Sig  Dispense  Refill  . albuterol (PROVENTIL HFA) 108 (90 BASE) MCG/ACT inhaler   Inhalation   Inhale 2 puffs into the lungs every 4 (four) hours as needed for wheezing or shortness of breath.   1 Inhaler   0   . allopurinol (ZYLOPRIM) 100 MG tablet   Oral   Take 1 tablet (100 mg total) by mouth daily.   90 tablet   1   . amLODipine (NORVASC) 10 MG tablet      take 1 tablet by  mouth once daily   30 tablet   2   . aspirin 81 MG chewable tablet   Oral   Chew 1 tablet (81 mg total) by mouth daily.   30 tablet   2   . atorvastatin (LIPITOR) 40 MG tablet   Oral   Take 1 tablet (40 mg total) by mouth daily. Patient taking differently: Take 20 mg by mouth at bedtime.    90 tablet   3   . B-D ULTRAFINE III SHORT PEN 31G X 8 MM MISC      use as directed   100 each   11     Phone number to our clinic (as requested) is [phone] ...   . DULoxetine (CYMBALTA) 60 MG capsule   Oral   Take 120 mg by mouth daily.      0   . furosemide (LASIX) 80 MG tablet      take 1 tablet by mouth 4 TIMES  A WEEK ,TUESDAY ,THURSDAY ,SATURDAY AND SUNDAY   36 tablet   3   . gabapentin (NEURONTIN) 100 MG capsule   Oral   Take 100 mg by mouth daily.      1   . Insulin Degludec (TRESIBA FLEXTOUCH) 100 UNIT/ML SOPN   Subcutaneous   Inject 40 Units into the skin daily. Patient taking differently: Inject 40 Units into the skin daily. 6 units in the evening   15 mL   2   .  metoprolol (LOPRESSOR) 50 MG tablet   Oral   Take 50 mg by mouth 2 (two) times daily.         . multivitamin (RENA-VIT) TABS tablet   Oral   Take 1 tablet by mouth daily.         [name]. NOVOLOG FLEXPEN 100 UNIT/ML FlexPen      INJECT 22 UNITS INTO THE SKIN THREE TIMES DAILY WITH MEALS   15 mL   2   . pantoprazole (PROTONIX) 40 MG tablet      take 1 tablet by mouth once daily   30 tablet   0   . predniSONE (DELTASONE) 5 MG tablet   Oral   Take 3 tablets (15 mg total) by mouth daily with breakfast.   90 tablet   0   . SENSIPAR 30 MG tablet   Oral   Take 30 mg by mouth at bedtime.            Dispense as written.   . sevelamer carbonate (RENVELA) 800 MG tablet   Oral   Take 800 mg by mouth 3 (three) times daily. And 2 tablet with snacks         . sulfamethoxazole-trimethoprim (BACTRIM DS,SEPTRA DS) 800-160 MG tablet   Oral   Take 1 tablet by mouth 2 (two) times daily.   30  tablet   1     1tab every Tuesday Thurdays and Saturday until wea ...   . valsartan (DIOVAN) 80 MG tablet   Oral   Take 80 mg by mouth at bedtime.      0      Review of Systems: Gen:  Denies  fever, sweats, chills HEENT: Denies blurred vision, double vision, ear pain, eye pain, hearing loss, nose bleeds, sore throat Cvc:  No dizziness, chest pain or heaviness Resp:   Admits ZO:[id number]:chronic dyspnea, mild non productive cough Gi: Denies swallowing difficulty, stomach pain, nausea or vomiting, diarrhea, constipation, bowel incontinence Gu:  Denies bladder incontinence, burning urine Ext:   No Joint pain, stiffness or swelling Skin: No skin rash, easy bruising or bleeding or hives Endoc:  No polyuria, polydipsia , polyphagia or weight change Other:  All other systems negative  Allergies:  Oxycodone-acetaminophen  Physical Examination:  VS: BP 124/76 mmHg  Pulse 83  Ht 6\' 1"  (1.854 m)  Wt 323 lb (146.512 kg)  BMI 42.62 kg/m2  SpO2 97%  General Appearance: No distress  HEENT: PERRLA, no ptosis, no other lesions noticed Pulmonary: Good respiratory effort, mild decreased breath sounds at the bases(this is baseline), no wheezes, no crackles Cardiovascular:  Normal S1,S2.  No m/r/g.     Abdomen:Exam: Benign, Soft, non-tender, No masses  Skin:   warm, no ecchymosis, fine milia appearance lesions on the face and forehead (no erythema, no eruption) Extremities: normal, no cyanosis, clubbing, warm with normal capillary refill.      Rad results: (The following images and results were reviewed by Dr. Dema SeverinMungal on 01/22/2016). CXR 01/22/16 EXAM: CHEST 2 VIEW  COMPARISON: 12/09/2015  FINDINGS: Mild cardiomegaly. Areas of scarring in the right lung are stable. Left lung is clear. No effusions. No acute bony abnormality.  IMPRESSION: Cardiomegaly. Chronic changes. No active disease..    Assessment and Plan:55 yo male seen in follow up for BOOP. Patient doing well on current dose.  No exacerbations of SOB, no signs of infection at this time. Recommend slow taper as previously discussed with pateint   BOOP (bronchiolitis obliterans with organizing pneumonia) BOOP -  surgical biopsy proven Unknown trigger. Today we continued Prednisone to  daily until follow up, due to still with sob and cough. I suspect that allergies and rapid weather changes are affecting is overall progression to wean off steroids at this time.   Plan: - continue with  daily until follow up in 1 month. Patient is advised if symptoms return, and is persistent for 3-4 days, then increase prednisone to 20 mg daily until follow-up visit - If there is another relapse of BOOP, will have to consider another bronchoscopy to rule out atypical infections exacerbating BOOP. - continue with 3L O2 with exertion and with sleep - Today we also discussed the possibility of bronchoscopy if unable to wean steroids and possibly referral to [facility] clinic if in the next 2-3 months unable to wean steroids with a negative follow-up bronchoscopy - Bactrim for PJP prophylaxis while on steriods-- 1 DS tab BID on Tu/Th/Sat until wean down to  prednisone.

## 2016-01-22 NOTE — Assessment & Plan Note (Signed)
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 Today we continued Prednisone to 15mg  daily until follow up, due to still with sob and cough. I suspect that allergies and rapid weather changes are affecting is overall progression to wean off steroids at this time.   Plan: - continue with 15mg  daily until follow up in 1 month. Patient is advised if symptoms return, and is persistent for 3-4 days, then increase prednisone to 20 mg daily until follow-up visit - If there is another relapse of BOOP, will have to consider another bronchoscopy to rule out atypical infections exacerbating BOOP. - continue with 3L O2 with exertion and with sleep - Today we also discussed the possibility of bronchoscopy if unable to wean steroids and possibly [facility] if in the next 2-3 months unable to wean steroids with a negative follow-up bronchoscopy - Bactrim for PJP prophylaxis while on steriods-- 1 DS tab BID on Tu/Th/Sat until wean down to 10mg  prednisone.

## 2016-01-22 NOTE — Patient Instructions (Addendum)
Follow up with Dr. Dema SeverinMungal in: 34month - Prednisone 15mg  daily until follow up visit.  - Bactrim for PJP prophylaxis while on steriods-- 1 DS tab BID on Tu/Th/Sat until wean down to 10mg  prednisone.  - cont with diet and exercise (as tolerated) - avoid noxious substances - avoid any forms of tobacco.

## 2016-01-31 DIAGNOSIS — T859XXA Unspecified complication of internal prosthetic device, implant and graft, initial encounter: Secondary | ICD-10-CM | POA: Diagnosis not present

## 2016-01-31 DIAGNOSIS — N186 End stage renal disease: Secondary | ICD-10-CM | POA: Diagnosis not present

## 2016-01-31 DIAGNOSIS — E669 Obesity, unspecified: Secondary | ICD-10-CM | POA: Diagnosis not present

## 2016-01-31 DIAGNOSIS — E785 Hyperlipidemia, unspecified: Secondary | ICD-10-CM | POA: Diagnosis not present

## 2016-01-31 DIAGNOSIS — J8489 Other specified interstitial pulmonary diseases: Secondary | ICD-10-CM | POA: Diagnosis not present

## 2016-01-31 DIAGNOSIS — I10 Essential (primary) hypertension: Secondary | ICD-10-CM | POA: Diagnosis not present

## 2016-01-31 DIAGNOSIS — Y841 Kidney dialysis as the cause of abnormal reaction of the patient, or of later complication, without mention of misadventure at the time of the procedure: Secondary | ICD-10-CM | POA: Diagnosis not present

## 2016-01-31 DIAGNOSIS — J449 Chronic obstructive pulmonary disease, unspecified: Secondary | ICD-10-CM | POA: Diagnosis not present

## 2016-02-10 DIAGNOSIS — N186 End stage renal disease: Secondary | ICD-10-CM | POA: Diagnosis not present

## 2016-02-10 DIAGNOSIS — Z992 Dependence on renal dialysis: Secondary | ICD-10-CM | POA: Diagnosis not present

## 2016-02-11 DIAGNOSIS — N2581 Secondary hyperparathyroidism of renal origin: Secondary | ICD-10-CM | POA: Diagnosis not present

## 2016-02-11 DIAGNOSIS — D509 Iron deficiency anemia, unspecified: Secondary | ICD-10-CM | POA: Diagnosis not present

## 2016-02-11 DIAGNOSIS — Z992 Dependence on renal dialysis: Secondary | ICD-10-CM | POA: Diagnosis not present

## 2016-02-11 DIAGNOSIS — D631 Anemia in chronic kidney disease: Secondary | ICD-10-CM | POA: Diagnosis not present

## 2016-02-11 DIAGNOSIS — N186 End stage renal disease: Secondary | ICD-10-CM | POA: Diagnosis not present

## 2016-02-12 ENCOUNTER — Encounter: Payer: Self-pay | Admitting: Internal Medicine

## 2016-02-12 ENCOUNTER — Ambulatory Visit (INDEPENDENT_AMBULATORY_CARE_PROVIDER_SITE_OTHER): Payer: Medicare Other | Admitting: Internal Medicine

## 2016-02-12 VITALS — BP 140/88 | HR 79 | Wt 323.0 lb

## 2016-02-12 DIAGNOSIS — J8489 Other specified interstitial pulmonary diseases: Secondary | ICD-10-CM

## 2016-02-12 MED ORDER — PREDNISONE 5 MG PO TABS: 10.0000 mg | ORAL_TABLET | Freq: Every day | ORAL | Status: DC

## 2016-02-12 NOTE — Assessment & Plan Note (Addendum)
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 Today we reduced prednisone to 10 mg until follow up.  Plan: - prednisone 10 mg daily until follow up in 1 month. Patient is advised if symptoms return, and is persistent for 3-4 days, then increase prednisone to 15 mg daily until follow-up visit - If there is another relapse of BOOP, will have to consider another bronchoscopy to rule out atypical infections exacerbating BOOP. - continue with 3L O2 with exertion and with sleep, follow-up with sleep doctor for further recommendations - Today we also discussed the possibility of bronchoscopy if unable to wean steroids and possibly [facility] if in the next 2-3 months unable to wean steroids with a negative follow-up bronchoscopy - Bactrim for PJP prophylaxis while on steriods-- 1 DS tab BID on Tu/Th/Sat until wean down to 10mg  prednisone.

## 2016-02-12 NOTE — Progress Notes (Signed)
MRN# [id number] Ryan Arnold 08/01/1961   CC: Chief Complaint  Patient presents with  . Follow-up    breathing at baseline until this am; felt breathing was better last night; didn't use O2 from yesterday till this am. felt SOB when getting ready this am. feels drained today   Synopsis: 55 year old male past medical history of end-stage renal disease on dialysis, uncontrolled diabetes, hypertension, former retired Emergency planning/management officer, seen in consultation for recurrent pneumonias. Multiple hospitalizations at [facility] and at [facility] over the past one year for bilateral groundglass opacities throughout entire lung fields requiring multiple rounds of antibiotics and steroids. Had a bronchoscopy done at [facility] and within the last 2 years no acute findings from the BAL. Patient with recent bronchoscopy, March 2016, at [facility] by Dr. Dema Severin, BAL with no acute findings. April 2016 surgical lung bx (wedge) by Dr. Thelma Barge, path confirms BOOP, started on high dose steroids.   Events since last clinic visit: Patient presents today for a BOOP follow up. Currently on Bactrim for PJP prophylaxis while on steriods-- 1 DS BID tab on Tu/Th/Sat until wean down to  prednisone. Overall states that he has good and bad days of breathing, currently down to  of prednisone., mostly good days of breathing. He states that he is able to go without oxygen for about 36 hours before he started getting significant shortness of breath. He is supposed to be on oxygen with his BiPAP at night but last like he did not wear it trying to wean himself off of oxygen and became severely short of breath this morning. No significant worsening since last visit. We discussed decreasing prednisone to 10 mg daily, which she is in agreement with today     Medication:   Current Outpatient Rx  Name  Route  Sig  Dispense  Refill  . albuterol (PROVENTIL HFA) 108 (90 BASE) MCG/ACT inhaler   Inhalation   Inhale 2 puffs into the lungs every 4 (four)  hours as needed for wheezing or shortness of breath.   1 Inhaler   0   . allopurinol (ZYLOPRIM) 100 MG tablet   Oral   Take 1 tablet (100 mg total) by mouth daily.   90 tablet   1   . amLODipine (NORVASC) 10 MG tablet      take 1 tablet by mouth once daily   30 tablet   2   . aspirin 81 MG chewable tablet   Oral   Chew 1 tablet (81 mg total) by mouth daily.   30 tablet   2   . atorvastatin (LIPITOR) 40 MG tablet   Oral   Take 1 tablet (40 mg total) by mouth daily. Patient taking differently: Take 20 mg by mouth at bedtime.    90 tablet   3   . B-D ULTRAFINE III SHORT PEN 31G X 8 MM MISC      use as directed   100 each   11     Phone number to our clinic (as requested) is [phone] ...   . DULoxetine (CYMBALTA) 60 MG capsule   Oral   Take 120 mg by mouth daily.      0   . furosemide (LASIX) 80 MG tablet      take 1 tablet by mouth 4 TIMES  A WEEK ,TUESDAY ,THURSDAY ,SATURDAY AND SUNDAY   36 tablet   3   . gabapentin (NEURONTIN) 100 MG capsule   Oral   Take 100 mg by mouth daily.  1   . Insulin Degludec (TRESIBA FLEXTOUCH) 100 UNIT/ML SOPN   Subcutaneous   Inject 40 Units into the skin daily. Patient taking differently: Inject 40 Units into the skin daily. 6 units in the evening   15 mL   2   . metoprolol (LOPRESSOR) 50 MG tablet   Oral   Take 50 mg by mouth 2 (two) times daily.         . multivitamin (RENA-VIT) TABS tablet   Oral   Take 1 tablet by mouth daily.         [name] NOVOLOG FLEXPEN 100 UNIT/ML FlexPen      INJECT 22 UNITS INTO THE SKIN THREE TIMES DAILY WITH MEALS   15 mL   2   . pantoprazole (PROTONIX) 40 MG tablet      take 1 tablet by mouth once daily   30 tablet   0   . predniSONE (DELTASONE) 5 MG tablet   Oral   Take 2 tablets (10 mg total) by mouth daily with breakfast.   90 tablet   0   . SENSIPAR 30 MG tablet   Oral   Take 30 mg by mouth at bedtime.            Dispense as written.   . sevelamer carbonate  (RENVELA) 800 MG tablet   Oral   Take 800 mg by mouth 3 (three) times daily. And 2 tablet with snacks         . sulfamethoxazole-trimethoprim (BACTRIM DS,SEPTRA DS) 800-160 MG tablet   Oral   Take 1 tablet by mouth 2 (two) times daily.   30 tablet   1     1tab every Tuesday Thurdays and Saturday until wea ...   . valsartan (DIOVAN) 80 MG tablet   Oral   Take 80 mg by mouth at bedtime.      0      Review of Systems: Gen:  Denies  fever, sweats, chills HEENT: Denies blurred vision, double vision, ear pain, eye pain, hearing loss, nose bleeds, sore throat Cvc:  No dizziness, chest pain or heaviness Resp:   Admits RU:[id number] dyspnea, mild non productive cough Gi: Denies swallowing difficulty, stomach pain, nausea or vomiting, diarrhea, constipation, bowel incontinence Gu:  Denies bladder incontinence, burning urine Ext:   No Joint pain, stiffness or swelling Skin: No skin rash, easy bruising or bleeding or hives Endoc:  No polyuria, polydipsia , polyphagia or weight change Other:  All other systems negative  Allergies:  Oxycodone-acetaminophen and Tape  Physical Examination:  VS: BP 140/88 mmHg  Pulse 79  Wt 323 lb (146.512 kg)  SpO2 100%  General Appearance: No distress  HEENT: PERRLA, no ptosis, no other lesions noticed Pulmonary: Good respiratory effort, mild decreased breath sounds at the bases(this is baseline), no wheezes, no crackles Cardiovascular:  Normal S1,S2.  No m/r/g.     Abdomen:Exam: Benign, Soft, non-tender, No masses  Skin:   warm, no ecchymosis, fine milia appearance lesions on the face and forehead (no erythema, no eruption) Extremities: normal, no cyanosis, clubbing, warm with normal capillary refill.      Rad results: (The following images and results were reviewed by Dr. Dema Severin on 02/12/2016). CXR 01/22/16 EXAM: CHEST 2 VIEW  COMPARISON: 12/09/2015  FINDINGS: Mild cardiomegaly. Areas of scarring in the right lung are stable. Left lung  is clear. No effusions. No acute bony abnormality.  IMPRESSION: Cardiomegaly. Chronic changes. No active disease..    Assessment and Plan:54 yo male  seen in follow up for BOOP. Patient doing well on current dose. No exacerbations of SOB, no signs of infection at this time. 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 Today we reduced prednisone to 10 mg until follow up.  Plan: - prednisone 10 mg daily until follow up in 1 month. Patient is advised if symptoms return, and is persistent for 3-4 days, then increase prednisone to 15 mg daily until follow-up visit - If there is another relapse of BOOP, will have to consider another bronchoscopy to rule out atypical infections exacerbating BOOP. - continue with 3L O2 with exertion and with sleep, follow-up with sleep doctor for further recommendations - Today we also discussed the possibility of bronchoscopy if unable to wean steroids and [facility] if in the next 2-3 months unable to wean steroids with a negative follow-up bronchoscopy - Bactrim for PJP prophylaxis while on steriods-- 1 DS tab BID on Tu/Th/Sat until wean down to 10mg  prednisone.

## 2016-02-12 NOTE — Patient Instructions (Addendum)
Follow up with Dr. Dema SeverinMungal in:1 months - start prednisone 10mg  daily on 02/13/16, until follow up visit. If symptoms (coughing, wheezing, sputum production) return for 3 or more days then go back to prednisone 15mg  daily - cont with diet exercise and wt. Loss - follow up with sleep doctor for any further sleep order pertaining to your bipap and oxygen needs.  - cont with prophylaxis antibiotics on non-dialysis days.

## 2016-02-28 ENCOUNTER — Ambulatory Visit (INDEPENDENT_AMBULATORY_CARE_PROVIDER_SITE_OTHER): Payer: Medicare Other | Admitting: Endocrinology

## 2016-02-28 ENCOUNTER — Other Ambulatory Visit: Payer: Self-pay

## 2016-02-28 VITALS — BP 154/72 | HR 76 | Wt 321.0 lb

## 2016-02-28 DIAGNOSIS — E1065 Type 1 diabetes mellitus with hyperglycemia: Secondary | ICD-10-CM

## 2016-02-28 MED ORDER — INSULIN ASPART 100 UNIT/ML FLEXPEN: PEN_INJECTOR | SUBCUTANEOUS | Status: DC

## 2016-02-28 NOTE — Patient Instructions (Signed)
Tresiba 42 units DAILY REGARDLESS of sugar levels Adjust in 1 week to 46 if sugar in am stays >150  NOVOLOG 30-34-38 BEFORE MEALS, MAY ADJUST BASED ON MEAL SIZE AND may add more for hi sugars  Check before supper

## 2016-02-28 NOTE — Progress Notes (Signed)
Patient ID: NOHA KARASIK, male   DOB: 11/11/1960, 55 y.o.   MRN: [id number]    Chief complaint : Follow up of Type 1 Diabetes  History of Present Illness:          Date of diagnosis: Age 11      Prior history:     He has been on various insulin regimens  since diagnosis.   Was on insulin pump 2 years ago, and then taken off pump during hospitalization and never put back on it.  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, and found it slightly more convenient at that time  Recent history:    INSULIN regimen: Tresiba 30 up to 46 units in a.m., Novolog 24 units before meals  He has been on prednisone since 6/16 for his pulmonary condition and blood sugars have been consistently high  Currently is taking 10 mg mg and the dose has been lowered  Current management, blood sugar patterns, problems identified:  He was supposed to be taking 46 units of Tresiba once a day but he is taking variable doses in the morning and he is adjusting his based on how much he is eating and also reducing it if his blood sugar is near normal in the morning.  He was not told to make any changes  He was given written instructions on taking up to 38 units of NOVOLOG with meals with the higher doses at suppertime but he is arbitrarily taking only 24 units before meals  Despite his blood sugars going up to 600 he still does not take any extra insulin for high readings as previously instructed  His blood sugars are averaging 370 late evening  His blood sugars are still overall averaging well over 200 and somewhat higher than on his last visit  He does try to taking Novolog before he is evening meal when he is going out to eat although not consistently  He may take some insulin late at night for high readings but no more than 10-15 units  Dialysis days: Monday/Wednesday/Friday  He refuses to start the insulin pump as he thinks it is too complicated  Glucose monitoring:  done about 2 times a day          Glucometer: One Touch.      Blood Glucose reading analysis from monitor download as follows:  Mean values apply above for all meters except median for One Touch  PRE-MEAL Fasting Lunch Dinner Bedtime Overall  Glucose range: 86-367    91-601    Mean/median: 142  218   370   248+/-144    Symptoms of hypoglycemia: Weakness and shakiness Self-care:   Meal times:  breakfast 5 am on dialysis days otherwise his first meal is at 12 noon, dinner 6-7 pm  Exercise:.  unable to do any         Most recent dietitian/nurse educator visit : Years ago           Diabetes labs:  Lab Results  Component Value Date   HGBA1C 8.4 01/17/2016   HGBA1C 8.4 10/11/2015   HGBA1C 8.7 06/14/2015   Lab Results  Component Value Date   LDLCALC 65 06/14/2015   CREATININE 10.65* 09/16/2015        Medication List       This list is accurate as of: 02/28/16 12:54 PM.  Always use your most recent med list.               albuterol  108 (90 Base) MCG/ACT inhaler  Commonly known as:  PROVENTIL HFA  Inhale 2 puffs into the lungs every 4 (four) hours as needed for wheezing or shortness of breath.     allopurinol 100 MG tablet  Commonly known as:  ZYLOPRIM  Take 1 tablet (100 mg total) by mouth daily.     amLODipine 10 MG tablet  Commonly known as:  NORVASC  take 1 tablet by mouth once daily     aspirin 81 MG chewable tablet  Chew 1 tablet (81 mg total) by mouth daily.     atorvastatin 40 MG tablet  Commonly known as:  LIPITOR  Take 1 tablet (40 mg total) by mouth daily.     B-D ULTRAFINE III SHORT PEN 31G X 8 MM Misc  Generic drug:  Insulin Pen Needle  use as directed     DULoxetine 60 MG capsule  Commonly known as:  CYMBALTA  Take 120 mg by mouth daily.     furosemide 80 MG tablet  Commonly known as:  LASIX  take 1 tablet by mouth 4 TIMES  A WEEK ,TUESDAY ,THURSDAY ,SATURDAY AND SUNDAY     gabapentin 100 MG capsule  Commonly known as:  NEURONTIN  Take 100 mg by mouth daily.      insulin aspart 100 UNIT/ML FlexPen  Commonly known as:  NOVOLOG FLEXPEN  INJECT 35 UNITS INTO THE SKIN THREE TIMES DAILY WITH MEALS     Insulin Degludec 100 UNIT/ML Sopn  Commonly known as:  TRESIBA FLEXTOUCH  Inject 40 Units into the skin daily.     metoprolol 50 MG tablet  Commonly known as:  LOPRESSOR  Take 50 mg by mouth 2 (two) times daily.     multivitamin Tabs tablet  Take 1 tablet by mouth daily.     pantoprazole 40 MG tablet  Commonly known as:  PROTONIX  take 1 tablet by mouth once daily     predniSONE 5 MG tablet  Commonly known as:  DELTASONE  Take 2 tablets (10 mg total) by mouth daily with breakfast.     SENSIPAR 30 MG tablet  Generic drug:  cinacalcet  Take 30 mg by mouth at bedtime.     sevelamer carbonate 800 MG tablet  Commonly known as:  RENVELA  Take 800 mg by mouth 3 (three) times daily. And 2 tablet with snacks     sulfamethoxazole-trimethoprim 800-160 MG tablet  Commonly known as:  BACTRIM DS,SEPTRA DS  Take 1 tablet by mouth 2 (two) times daily.     valsartan 80 MG tablet  Commonly known as:  DIOVAN  Take 80 mg by mouth at bedtime.        Allergies:  Allergies  Allergen Reactions  . Oxycodone-Acetaminophen Nausea And Vomiting  . Tape Other (See Comments)    Paper tape caused blisters    Past Medical History  Diagnosis Date  . Essential hypertension   . Hyperlipemia   . End stage renal disease (HCC)     a. on dialysis; still makes urine.  [name] BOOP (bronchiolitis obliterans with organizing pneumonia) (HCC)     a. 01/2018 s/p R thoracoscopy/Bx confirming BOOP;  b. 02/2018 high dose steroids started.  . Chronic diastolic CHF (congestive heart failure) (HCC)     a. 12/2014 Echo: EF 50-55%, mild conc LVH, mildly dil LA.  . Morbid obesity (HCC)   . Secondary hyperparathyroidism (HCC)   . Anemia of chronic disease   . Recurrent pneumonia  a. 2/2 BOOP.  [name]. Chest pain     a. 06/2014 Myoview ([facility]) no ischemia/infarct, nl EF.  . Type 1  diabetes mellitus (HCC)   . Thyroid disease   . OSA treated with BiPAP     Past Surgical History  Procedure Laterality Date  . Cataract Bilateral   . Shoulder surgery Left   . Knee surgery Right   . Gallbladder surgery    . Lung biopsy      Family History  Problem Relation Age of Onset  . Diabetes Mother     died @ 7162.  [name]. Hypertension Mother   . Hypertension Maternal Grandmother     Social History:  reports that he has never smoked. He has never used smokeless tobacco. He reports that he does not drink alcohol or use illicit drugs.    Review of Systems:   CKD: He is on dialysis, goes on Mondays, Wednesdays and Fridays  He is on steroids for bronchiolitis obliterans and will need to be continued on prednisone, Now 10 mg  Lipids: Better controlled on his last labs He thinks he is compliant with his Lipitor  Lab Results  Component Value Date   CHOL 194 06/14/2015   HDL 99.30 06/14/2015   LDLCALC 65 06/14/2015   TRIG 151.0* 06/14/2015   CHOLHDL 2 06/14/2015    Diabetes complications: Nephropathy  Last Eye Exam was in 6/16   Physical Examination:  BP 154/72 mmHg  Pulse 76  Wt 321 lb (145.605 kg)  SpO2 90%         ASSESSMENT: See history of present illness for detailed discussion of his current management, blood sugar patterns and problems identified  Diabetes type 1 with consistently poor control  He is taking inadequate amounts of insulin especially mealtime insulin and blood sugars are increasing progressively from morning to evening Not clear why he is continuing to be insulin resistant He is also not understanding the need to take extra insulin with high blood sugars as instructed before and is also not following instructions for the actual doses given Taking only 30 units of Tresiba on some days when his blood sugars are not as high and he is eating a lighter meal.  Does not understand that basal insulin does not need to be adjusted for the type of  meals he is having  Highest blood sugars are after supper with average well over 300    PLAN:   He will need to increase all his insulin doses considerably  Explained to him the actions of basal insulin and not to change it unless fasting blood sugars are consistently higher or lower on a weekly basis  He was started with 42 units of Tresiba and increase in one week if needed  New insulin doses for him Novolog given and also new prescription given to allow 2 boxes per month  He needs to be an consistent diet and will have him see the dietitian also on the next visit  Instructed on taking extra insulin for higher carbohydrate meals or pre-meal hyperglycemia  He does to check more readings before supper also  Patient Instructions  Tresiba 42 units DAILY REGARDLESS of sugar levels Adjust in 1 week to 46 if sugar in am stays >150  NOVOLOG 30-34-38 BEFORE MEALS, MAY ADJUST BASED ON MEAL SIZE AND may add more for hi sugars  Check before supper      Counseling time on subjects discussed above is over 50% of today's 25 minute visit  Simcha Farrington 02/28/2016, 12:54 PM

## 2016-03-11 ENCOUNTER — Other Ambulatory Visit: Payer: Self-pay | Admitting: Family Medicine

## 2016-03-11 MED ORDER — ATORVASTATIN CALCIUM 40 MG PO TABS: 40.0000 mg | ORAL_TABLET | Freq: Every day | ORAL | Status: DC

## 2016-03-12 DIAGNOSIS — N186 End stage renal disease: Secondary | ICD-10-CM | POA: Diagnosis not present

## 2016-03-12 DIAGNOSIS — Z992 Dependence on renal dialysis: Secondary | ICD-10-CM | POA: Diagnosis not present

## 2016-03-14 DIAGNOSIS — Z992 Dependence on renal dialysis: Secondary | ICD-10-CM | POA: Diagnosis not present

## 2016-03-14 DIAGNOSIS — N2581 Secondary hyperparathyroidism of renal origin: Secondary | ICD-10-CM | POA: Diagnosis not present

## 2016-03-14 DIAGNOSIS — R531 Weakness: Secondary | ICD-10-CM | POA: Diagnosis not present

## 2016-03-14 DIAGNOSIS — E8779 Other fluid overload: Secondary | ICD-10-CM | POA: Diagnosis not present

## 2016-03-14 DIAGNOSIS — N186 End stage renal disease: Secondary | ICD-10-CM | POA: Diagnosis not present

## 2016-03-14 DIAGNOSIS — D631 Anemia in chronic kidney disease: Secondary | ICD-10-CM | POA: Diagnosis not present

## 2016-03-14 DIAGNOSIS — D509 Iron deficiency anemia, unspecified: Secondary | ICD-10-CM | POA: Diagnosis not present

## 2016-03-17 DIAGNOSIS — R531 Weakness: Secondary | ICD-10-CM | POA: Diagnosis not present

## 2016-03-17 DIAGNOSIS — D631 Anemia in chronic kidney disease: Secondary | ICD-10-CM | POA: Diagnosis not present

## 2016-03-17 DIAGNOSIS — E133519 Other specified diabetes mellitus with proliferative diabetic retinopathy with macular edema, unspecified eye: Secondary | ICD-10-CM | POA: Diagnosis not present

## 2016-03-17 DIAGNOSIS — E103519 Type 1 diabetes mellitus with proliferative diabetic retinopathy with macular edema, unspecified eye: Secondary | ICD-10-CM | POA: Diagnosis not present

## 2016-03-17 DIAGNOSIS — N2581 Secondary hyperparathyroidism of renal origin: Secondary | ICD-10-CM | POA: Diagnosis not present

## 2016-03-17 DIAGNOSIS — Z992 Dependence on renal dialysis: Secondary | ICD-10-CM | POA: Diagnosis not present

## 2016-03-17 DIAGNOSIS — H35373 Puckering of macula, bilateral: Secondary | ICD-10-CM | POA: Diagnosis not present

## 2016-03-17 DIAGNOSIS — E103513 Type 1 diabetes mellitus with proliferative diabetic retinopathy with macular edema, bilateral: Secondary | ICD-10-CM | POA: Diagnosis not present

## 2016-03-17 DIAGNOSIS — D509 Iron deficiency anemia, unspecified: Secondary | ICD-10-CM | POA: Diagnosis not present

## 2016-03-17 DIAGNOSIS — N186 End stage renal disease: Secondary | ICD-10-CM | POA: Diagnosis not present

## 2016-03-17 LAB — HM DIABETES EYE EXAM

## 2016-03-18 DIAGNOSIS — D509 Iron deficiency anemia, unspecified: Secondary | ICD-10-CM | POA: Diagnosis not present

## 2016-03-18 DIAGNOSIS — R531 Weakness: Secondary | ICD-10-CM | POA: Diagnosis not present

## 2016-03-18 DIAGNOSIS — N186 End stage renal disease: Secondary | ICD-10-CM | POA: Diagnosis not present

## 2016-03-18 DIAGNOSIS — D631 Anemia in chronic kidney disease: Secondary | ICD-10-CM | POA: Diagnosis not present

## 2016-03-18 DIAGNOSIS — N2581 Secondary hyperparathyroidism of renal origin: Secondary | ICD-10-CM | POA: Diagnosis not present

## 2016-03-18 DIAGNOSIS — Z992 Dependence on renal dialysis: Secondary | ICD-10-CM | POA: Diagnosis not present

## 2016-03-19 ENCOUNTER — Other Ambulatory Visit: Payer: Self-pay | Admitting: Family Medicine

## 2016-03-19 DIAGNOSIS — N2581 Secondary hyperparathyroidism of renal origin: Secondary | ICD-10-CM | POA: Diagnosis not present

## 2016-03-19 DIAGNOSIS — D509 Iron deficiency anemia, unspecified: Secondary | ICD-10-CM | POA: Diagnosis not present

## 2016-03-19 DIAGNOSIS — Z992 Dependence on renal dialysis: Secondary | ICD-10-CM | POA: Diagnosis not present

## 2016-03-19 DIAGNOSIS — R531 Weakness: Secondary | ICD-10-CM | POA: Diagnosis not present

## 2016-03-19 DIAGNOSIS — N186 End stage renal disease: Secondary | ICD-10-CM | POA: Diagnosis not present

## 2016-03-19 DIAGNOSIS — D631 Anemia in chronic kidney disease: Secondary | ICD-10-CM | POA: Diagnosis not present

## 2016-03-21 DIAGNOSIS — N2581 Secondary hyperparathyroidism of renal origin: Secondary | ICD-10-CM | POA: Diagnosis not present

## 2016-03-21 DIAGNOSIS — Z992 Dependence on renal dialysis: Secondary | ICD-10-CM | POA: Diagnosis not present

## 2016-03-21 DIAGNOSIS — R531 Weakness: Secondary | ICD-10-CM | POA: Diagnosis not present

## 2016-03-21 DIAGNOSIS — N186 End stage renal disease: Secondary | ICD-10-CM | POA: Diagnosis not present

## 2016-03-21 DIAGNOSIS — D631 Anemia in chronic kidney disease: Secondary | ICD-10-CM | POA: Diagnosis not present

## 2016-03-21 DIAGNOSIS — D509 Iron deficiency anemia, unspecified: Secondary | ICD-10-CM | POA: Diagnosis not present

## 2016-03-24 DIAGNOSIS — N186 End stage renal disease: Secondary | ICD-10-CM | POA: Diagnosis not present

## 2016-03-24 DIAGNOSIS — Z992 Dependence on renal dialysis: Secondary | ICD-10-CM | POA: Diagnosis not present

## 2016-03-24 DIAGNOSIS — D631 Anemia in chronic kidney disease: Secondary | ICD-10-CM | POA: Diagnosis not present

## 2016-03-24 DIAGNOSIS — N2581 Secondary hyperparathyroidism of renal origin: Secondary | ICD-10-CM | POA: Diagnosis not present

## 2016-03-24 DIAGNOSIS — D509 Iron deficiency anemia, unspecified: Secondary | ICD-10-CM | POA: Diagnosis not present

## 2016-03-24 DIAGNOSIS — R531 Weakness: Secondary | ICD-10-CM | POA: Diagnosis not present

## 2016-03-26 DIAGNOSIS — D631 Anemia in chronic kidney disease: Secondary | ICD-10-CM | POA: Diagnosis not present

## 2016-03-26 DIAGNOSIS — N186 End stage renal disease: Secondary | ICD-10-CM | POA: Diagnosis not present

## 2016-03-26 DIAGNOSIS — D509 Iron deficiency anemia, unspecified: Secondary | ICD-10-CM | POA: Diagnosis not present

## 2016-03-26 DIAGNOSIS — N2581 Secondary hyperparathyroidism of renal origin: Secondary | ICD-10-CM | POA: Diagnosis not present

## 2016-03-26 DIAGNOSIS — Z992 Dependence on renal dialysis: Secondary | ICD-10-CM | POA: Diagnosis not present

## 2016-03-26 DIAGNOSIS — R531 Weakness: Secondary | ICD-10-CM | POA: Diagnosis not present

## 2016-03-27 ENCOUNTER — Other Ambulatory Visit: Payer: Self-pay | Admitting: Endocrinology

## 2016-03-27 ENCOUNTER — Ambulatory Visit (INDEPENDENT_AMBULATORY_CARE_PROVIDER_SITE_OTHER): Payer: Medicare Other | Admitting: Internal Medicine

## 2016-03-27 ENCOUNTER — Encounter: Payer: Self-pay | Admitting: Internal Medicine

## 2016-03-27 VITALS — BP 130/70 | HR 82 | Ht 73.0 in | Wt 328.0 lb

## 2016-03-27 DIAGNOSIS — J8489 Other specified interstitial pulmonary diseases: Secondary | ICD-10-CM | POA: Diagnosis not present

## 2016-03-27 DIAGNOSIS — J9611 Chronic respiratory failure with hypoxia: Secondary | ICD-10-CM

## 2016-03-27 DIAGNOSIS — J9610 Chronic respiratory failure, unspecified whether with hypoxia or hypercapnia: Secondary | ICD-10-CM | POA: Insufficient documentation

## 2016-03-27 MED ORDER — PREDNISONE 5 MG PO TABS: ORAL_TABLET | ORAL | Status: DC

## 2016-03-27 NOTE — Patient Instructions (Signed)
Follow up with Dr. Dema SeverinMungal in:6 weeks - Prednisone 10mg  -  Take 10mg  on Tuesday, Thursday, Saturday with breakfast - if symptoms recur, then go back to previous dose and call us for further recommendations. - you do not need Bactrim anymore for PCP prophylaxis - cont with 2L O2 with exertion

## 2016-03-27 NOTE — Assessment & Plan Note (Signed)
Multifactorial - BOOP, dCHF, OSA, obesity Currently on 2-3L of o2 with exertion and at night.

## 2016-03-27 NOTE — Progress Notes (Signed)
MRN# [id number] Ryan Arnold Jun 02, 1961   CC: Chief Complaint  Patient presents with  . Follow-up    pt states breathing is baseline since last OV. occ sob w/exertion, occ cp. denies cough or wheezing. on 2L w/exertion &    Synopsis: 55 year old male past medical history of end-stage renal disease on dialysis, uncontrolled diabetes, hypertension, former retired Emergency planning/management officer, seen in consultation for recurrent pneumonias. Multiple hospitalizations at [facility] and at [facility] over the past one year for bilateral groundglass opacities throughout entire lung fields requiring multiple rounds of antibiotics and steroids. Had a bronchoscopy done at [facility] and within the last 2 years no acute findings from the BAL. Patient with recent bronchoscopy, March 2016, at [facility] by Dr. Dema Severin, BAL with no acute findings. April 2016 surgical lung bx (wedge) by Dr. Thelma Barge, path confirms BOOP, started on high dose steroids.   Events since last clinic visit: Patient presents today for a BOOP follow up. Currently on Bactrim for PJP prophylaxis while on steriods-- 1 DS BID tab on Tu/Th/Sat until wean down to  prednisone. Overall states that he Has been doing well since his last visit. No more episodes of coughing, sputum production, significant shortness of breath. He qualifies for oxygen, today he had a partial walk test that showed desaturation to 88% while walking 3 laps around office, had to be put on 2 L of supplemental oxygen with exertion. Overall his breathing status is stabilized and he is slowly being weaned off of steroids. He is currently following endocrinology for steroid-induced diabetes.    Medication:   Current Outpatient Rx  Name  Route  Sig  Dispense  Refill  . albuterol (PROVENTIL HFA) 108 (90 BASE) MCG/ACT inhaler   Inhalation   Inhale 2 puffs into the lungs every 4 (four) hours as needed for wheezing or shortness of breath.   1 Inhaler   0   . allopurinol (ZYLOPRIM) 100 MG tablet  Oral   Take 1 tablet (100 mg total) by mouth daily.   90 tablet   1   . amLODipine (NORVASC) 10 MG tablet      take 1 tablet by mouth once daily   30 tablet   2   . aspirin 81 MG chewable tablet   Oral   Chew 1 tablet (81 mg total) by mouth daily.   30 tablet   2   . atorvastatin (LIPITOR) 40 MG tablet   Oral   Take 1 tablet (40 mg total) by mouth daily.   90 tablet   3   . B-D ULTRAFINE III SHORT PEN 31G X 8 MM MISC      use as directed   100 each   11     Phone number to our clinic (as requested) is [phone] ...   . DULoxetine (CYMBALTA) 60 MG capsule   Oral   Take 120 mg by mouth daily.      0   . furosemide (LASIX) 80 MG tablet      take 1 tablet by mouth 4 TIMES  A WEEK ,TUESDAY ,THURSDAY ,SATURDAY AND SUNDAY   36 tablet   3   . gabapentin (NEURONTIN) 100 MG capsule   Oral   Take 100 mg by mouth daily.      1   . insulin aspart (NOVOLOG FLEXPEN) 100 UNIT/ML FlexPen      INJECT 35 UNITS INTO THE SKIN THREE TIMES DAILY WITH MEALS   30 mL   3   . Insulin  Degludec (TRESIBA FLEXTOUCH) 100 UNIT/ML SOPN   Subcutaneous   Inject 40 Units into the skin daily. Patient taking differently: Inject into the skin daily. 46 units in the morning and 6 units in the evening   15 mL   2   . metoprolol (LOPRESSOR) 50 MG tablet   Oral   Take 50 mg by mouth 2 (two) times daily.         . multivitamin (RENA-VIT) TABS tablet   Oral   Take 1 tablet by mouth daily.         . pantoprazole (PROTONIX) 40 MG tablet      take 1 tablet by mouth once daily   90 tablet   3   . predniSONE (DELTASONE) 5 MG tablet      Take 2tabs (10mg ) Every Tuesday, Thursday & Saturday   40 tablet   0   . SENSIPAR 30 MG tablet   Oral   Take 30 mg by mouth at bedtime.            Dispense as written.   . sevelamer carbonate (RENVELA) 800 MG tablet   Oral   Take 800 mg by mouth 3 (three) times daily. And 2 tablet with snacks         . sulfamethoxazole-trimethoprim  (BACTRIM DS,SEPTRA DS) 800-160 MG tablet   Oral   Take 1 tablet by mouth 2 (two) times daily.   30 tablet   1     1tab every Tuesday Thurdays and Saturday until wea ...   . valsartan (DIOVAN) 80 MG tablet   Oral   Take 80 mg by mouth at bedtime.      0      Review of Systems: Gen:  Denies  fever, sweats, chills HEENT: Denies blurred vision, double vision, ear pain, eye pain, hearing loss, nose bleeds, sore throat Cvc:  No dizziness, chest pain or heaviness Resp:   Admits BM:[id number]:chronic dyspnea Gi: Denies swallowing difficulty, stomach pain, nausea or vomiting, diarrhea, constipation, bowel incontinence Gu:  Denies bladder incontinence, burning urine Ext:   No Joint pain, stiffness or swelling Skin: No skin rash, easy bruising or bleeding or hives Endoc:  No polyuria, polydipsia , polyphagia or weight change Other:  All other systems negative  Allergies:  Oxycodone-acetaminophen and Tape  Physical Examination:  VS: BP 130/70 mmHg  Pulse 82  Ht 6\' 1"  (1.854 m)  Wt 328 lb (148.78 kg)  BMI 43.28 kg/m2  SpO2 93%  General Appearance: No distress  HEENT: PERRLA, no ptosis, no other lesions noticed Pulmonary: Good respiratory effort, mild decreased breath sounds at the bases(this is baseline), no wheezes, no crackles Cardiovascular:  Normal S1,S2.  No m/r/g.     Abdomen:Exam: Benign, Soft, non-tender, No masses  Skin:   warm, no ecchymosis, fine milia appearance lesions on the face and forehead (no erythema, no eruption) Extremities: normal, no cyanosis, clubbing, warm with normal capillary refill.      Rad results: (The following images and results were reviewed by Dr. Dema SeverinMungal on 03/27/2016). CXR 01/22/16 EXAM: CHEST 2 VIEW  COMPARISON: 12/09/2015  FINDINGS: Mild cardiomegaly. Areas of scarring in the right lung are stable. Left lung is clear. No effusions. No acute bony abnormality.  IMPRESSION: Cardiomegaly. Chronic changes. No active disease..    Assessment  and Plan:55 yo male seen in follow up for BOOP. Patient doing well on current dose. No exacerbations of SOB, no signs of infection at this time. 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 Today we reduced prednisone to 10 mg on Tu, Th, and Saturday until follow up in 6 weeks.   Plan: - prednisone to 10 mg on Tu, Th, and Saturday until follow up in 6 weeks. . Patient is advised if symptoms return, and is persistent for 3-4 days, then increase prednisone to 10 mg daily until follow-up visit - If there is another relapse of BOOP, will have to consider another bronchoscopy to rule out atypical infections exacerbating BOOP. - continue with 2-3L O2 with exertion and with sleep, follow-up with sleep doctor for further recommendations - Today we also discussed the possibility of bronchoscopy if unable to wean steroids and possibly referral to [facility] clinic if in the next 2-3 months unable to wean steroids with a negative follow-up bronchoscopy - Bactrim for PJP prophylaxis while on steriods-- 1 DS tab BID on Tu/Th/Sat until wean down to 10mg  prednisone.                       Chronic respiratory failure (HCC) Multifactorial - BOOP, dCHF, OSA, obesity Currently on 2-3L of o2 with exertion and at night.

## 2016-03-27 NOTE — Assessment & Plan Note (Signed)
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 Today we reduced prednisone to 10 mg on Tu, Th, and Saturday until follow up in 6 weeks.   Plan: - prednisone to 10 mg on Tu, Th, and Saturday until follow up in 6 weeks. . Patient is advised if symptoms return, and is persistent for 3-4 days, then increase prednisone to 10 mg daily until follow-up visit - If there is another relapse of BOOP, will have to consider another bronchoscopy to rule out atypical infections exacerbating BOOP. - continue with 2-3L O2 with exertion and with sleep, follow-up with sleep doctor for further recommendations - Today we also discussed the possibility of bronchoscopy if unable to wean steroids and [facility] if in the next 2-3 months unable to wean steroids with a negative follow-up bronchoscopy - Bactrim for PJP prophylaxis while on steriods-- 1 DS tab BID on Tu/Th/Sat until wean down to 10mg  prednisone.

## 2016-03-28 ENCOUNTER — Telehealth: Payer: Self-pay | Admitting: Internal Medicine

## 2016-03-28 DIAGNOSIS — N2581 Secondary hyperparathyroidism of renal origin: Secondary | ICD-10-CM | POA: Diagnosis not present

## 2016-03-28 DIAGNOSIS — Z992 Dependence on renal dialysis: Secondary | ICD-10-CM | POA: Diagnosis not present

## 2016-03-28 DIAGNOSIS — D631 Anemia in chronic kidney disease: Secondary | ICD-10-CM | POA: Diagnosis not present

## 2016-03-28 DIAGNOSIS — N186 End stage renal disease: Secondary | ICD-10-CM | POA: Diagnosis not present

## 2016-03-28 DIAGNOSIS — R531 Weakness: Secondary | ICD-10-CM | POA: Diagnosis not present

## 2016-03-28 DIAGNOSIS — D509 Iron deficiency anemia, unspecified: Secondary | ICD-10-CM | POA: Diagnosis not present

## 2016-03-28 NOTE — Telephone Encounter (Signed)
Faxed OV note to ConcepcionStephanie and informed her that I haven't received the form back with the walk information. Will await response.

## 2016-03-28 NOTE — Telephone Encounter (Signed)
Will write name and NPI on forms and fax back to [place] with office notes.

## 2016-03-28 NOTE — Telephone Encounter (Signed)
[facility] calling asking if we can Amend the order that we sent yesterday for Oxygen order  Need it to have the doctors name and NPI number on it and sent back to them. Please advise.   Also asking for Office notes from yesterday sent over. Fax: [phone]

## 2016-03-31 ENCOUNTER — Encounter: Payer: Self-pay | Admitting: Emergency Medicine

## 2016-03-31 ENCOUNTER — Emergency Department
Admission: EM | Admit: 2016-03-31 | Discharge: 2016-03-31 | Disposition: A | Discharge status: Home / Self Care | Payer: Medicare Other | Attending: Emergency Medicine | Admitting: Emergency Medicine

## 2016-03-31 ENCOUNTER — Other Ambulatory Visit: Payer: Self-pay

## 2016-03-31 DIAGNOSIS — E785 Hyperlipidemia, unspecified: Secondary | ICD-10-CM | POA: Diagnosis not present

## 2016-03-31 DIAGNOSIS — R55 Syncope and collapse: Secondary | ICD-10-CM | POA: Diagnosis not present

## 2016-03-31 DIAGNOSIS — I5032 Chronic diastolic (congestive) heart failure: Secondary | ICD-10-CM | POA: Diagnosis not present

## 2016-03-31 DIAGNOSIS — N2581 Secondary hyperparathyroidism of renal origin: Secondary | ICD-10-CM | POA: Diagnosis not present

## 2016-03-31 DIAGNOSIS — Z7982 Long term (current) use of aspirin: Secondary | ICD-10-CM | POA: Diagnosis not present

## 2016-03-31 DIAGNOSIS — E1122 Type 2 diabetes mellitus with diabetic chronic kidney disease: Secondary | ICD-10-CM | POA: Insufficient documentation

## 2016-03-31 DIAGNOSIS — Z7951 Long term (current) use of inhaled steroids: Secondary | ICD-10-CM | POA: Insufficient documentation

## 2016-03-31 DIAGNOSIS — R531 Weakness: Secondary | ICD-10-CM | POA: Diagnosis not present

## 2016-03-31 DIAGNOSIS — Z992 Dependence on renal dialysis: Secondary | ICD-10-CM | POA: Diagnosis not present

## 2016-03-31 DIAGNOSIS — N186 End stage renal disease: Secondary | ICD-10-CM | POA: Insufficient documentation

## 2016-03-31 DIAGNOSIS — D509 Iron deficiency anemia, unspecified: Secondary | ICD-10-CM | POA: Diagnosis not present

## 2016-03-31 DIAGNOSIS — R6889 Other general symptoms and signs: Secondary | ICD-10-CM | POA: Diagnosis not present

## 2016-03-31 DIAGNOSIS — I132 Hypertensive heart and chronic kidney disease with heart failure and with stage 5 chronic kidney disease, or end stage renal disease: Secondary | ICD-10-CM | POA: Insufficient documentation

## 2016-03-31 DIAGNOSIS — R9431 Abnormal electrocardiogram [ECG] [EKG]: Secondary | ICD-10-CM | POA: Diagnosis not present

## 2016-03-31 DIAGNOSIS — D631 Anemia in chronic kidney disease: Secondary | ICD-10-CM | POA: Diagnosis not present

## 2016-03-31 LAB — BASIC METABOLIC PANEL
Anion gap: 10 (ref 5–15)
BUN: 20 mg/dL (ref 6–20)
CALCIUM: 8.3 mg/dL — AB (ref 8.9–10.3)
CHLORIDE: 96 mmol/L — AB (ref 101–111)
CO2: 34 mmol/L — ABNORMAL HIGH (ref 22–32)
CREATININE: 6.45 mg/dL — AB (ref 0.61–1.24)
GFR, EST AFRICAN AMERICAN: 10 mL/min — AB (ref >60)
GFR, EST NON AFRICAN AMERICAN: 9 mL/min — AB (ref >60)
Glucose, Bld: 134 mg/dL — ABNORMAL HIGH (ref 65–99)
Potassium: 4 mmol/L (ref 3.5–5.1)
SODIUM: 140 mmol/L (ref 135–145)

## 2016-03-31 LAB — CBC
HCT: 34.6 % — ABNORMAL LOW (ref 40.0–52.0)
Hemoglobin: 11.2 g/dL — ABNORMAL LOW (ref 13.0–18.0)
MCH: 32.6 pg (ref 26.0–34.0)
MCHC: 32.4 g/dL (ref 32.0–36.0)
MCV: 100.5 fL — ABNORMAL HIGH (ref 80.0–100.0)
PLATELETS: 336 10*3/uL (ref 150–440)
RBC: 3.44 MIL/uL — AB (ref 4.40–5.90)
RDW: 16 % — AB (ref 11.5–14.5)
WBC: 8.1 10*3/uL (ref 3.8–10.6)

## 2016-03-31 LAB — TROPONIN I

## 2016-03-31 LAB — GLUCOSE, CAPILLARY: GLUCOSE-CAPILLARY: 149 mg/dL — AB (ref 65–99)

## 2016-03-31 NOTE — ED Provider Notes (Signed)
[facility] Emergency Department Provider Note   ____________________________________________  Time seen: ~1150  I have reviewed the triage vital signs and the nursing notes.   HISTORY  Chief Complaint Near Syncope   History limited by: Not Limited   HPI Ryan Arnold is a 55 y.o. male who presents to the emergency department today from dialysis after an episode of hypotension and near syncope. For the past few days the patient says he has been feeling unwell. He has been staying in bed. He has had some nausea. He denies any associated abdominal pain and did not have any vomiting with this. He has not noticed any fevers or chills recently. At dialysis today he did ask that they draw blood cultures. Then upon finishing dialysis when he went to stand up he felt lightheaded and had to be caught to prevent him from falling. That time blood pressure was taken and noted to be hypotensive. Patient says that he does occasionally become hypotensive during dialysis but his never had an episode like this.   Past Medical History  Diagnosis Date  . Essential hypertension   . Hyperlipemia   . End stage renal disease (HCC)     a. on dialysis; still makes urine.  [name] BOOP (bronchiolitis obliterans with organizing pneumonia) (HCC)     a. 01/2018 s/p R thoracoscopy/Bx confirming BOOP;  b. 02/2018 high dose steroids started.  . Chronic diastolic CHF (congestive heart failure) (HCC)     a. 12/2014 Echo: EF 50-55%, mild conc LVH, mildly dil LA.  . Morbid obesity (HCC)   . Secondary hyperparathyroidism (HCC)   . Anemia of chronic disease   . Recurrent pneumonia     a. 2/2 BOOP.  [name] Chest pain     a. 06/2014 Myoview ([facility]) no ischemia/infarct, nl EF.  . Type 1 diabetes mellitus (HCC)   . Thyroid disease   . OSA treated with BiPAP     Patient Active Problem List   Diagnosis Date Noted  . Chronic respiratory failure (HCC) 03/27/2016  . Viral respiratory illness 12/04/2015  .  Impotence 06/05/2015  . Preventative health care 06/05/2015  . PVC's (premature ventricular contractions) 04/23/2015  . Anemia of chronic disease   . Secondary hyperparathyroidism (HCC)   . Morbid obesity (HCC)   . Chronic diastolic CHF (congestive heart failure) (HCC)   . Essential hypertension   . 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 Proliferative retinopathy 03/15/2015  . CKD (chronic kidney disease) stage V requiring chronic dialysis (HCC) 03/15/2015  . Hyperlipidemia 03/15/2015  . BOOP (bronchiolitis obliterans with organizing pneumonia) (HCC) 03/01/2015  . OSA (obstructive sleep apnea) 01/18/2015  . Recurrent pneumonia 01/15/2015    Past Surgical History  Procedure Laterality Date  . Cataract Bilateral   . Shoulder surgery Left   . Knee surgery Right   . Gallbladder surgery    . Lung biopsy      Current Outpatient Rx  Name  Route  Sig  Dispense  Refill  . albuterol (PROVENTIL HFA) 108 (90 BASE) MCG/ACT inhaler   Inhalation   Inhale 2 puffs into the lungs every 4 (four) hours as needed for wheezing or shortness of breath.   1 Inhaler   0   . allopurinol (ZYLOPRIM) 100 MG tablet   Oral   Take 1 tablet (100 mg total) by mouth daily.   90 tablet   1   . amLODipine (NORVASC) 10 MG tablet  take 1 tablet by mouth once daily   30 tablet   2   . aspirin 81 MG chewable tablet   Oral   Chew 1 tablet (81 mg total) by mouth daily.   30 tablet   2   . atorvastatin (LIPITOR) 40 MG tablet   Oral   Take 1 tablet (40 mg total) by mouth daily.   90 tablet   3   . B-D ULTRAFINE III SHORT PEN 31G X 8 MM MISC      use as directed   100 each   11     Phone number to our clinic (as requested) is [phone] ...   . DULoxetine (CYMBALTA) 60 MG capsule   Oral   Take 120 mg by mouth daily.      0   . furosemide (LASIX) 80 MG tablet      take 1 tablet by mouth 4 TIMES  A WEEK ,TUESDAY ,THURSDAY ,SATURDAY AND SUNDAY   36 tablet    3   . gabapentin (NEURONTIN) 100 MG capsule   Oral   Take 100 mg by mouth daily.      1   . insulin aspart (NOVOLOG FLEXPEN) 100 UNIT/ML FlexPen      INJECT 35 UNITS INTO THE SKIN THREE TIMES DAILY WITH MEALS   30 mL   3   . metoprolol (LOPRESSOR) 50 MG tablet   Oral   Take 50 mg by mouth 2 (two) times daily.         . multivitamin (RENA-VIT) TABS tablet   Oral   Take 1 tablet by mouth daily.         . pantoprazole (PROTONIX) 40 MG tablet      take 1 tablet by mouth once daily   90 tablet   3   . predniSONE (DELTASONE) 5 MG tablet      Take 2tabs (10mg ) Every Tuesday, Thursday & Saturday   40 tablet   0   . SENSIPAR 30 MG tablet   Oral   Take 30 mg by mouth at bedtime.            Dispense as written.   . sevelamer carbonate (RENVELA) 800 MG tablet   Oral   Take 800 mg by mouth 3 (three) times daily. And 2 tablet with snacks         . sulfamethoxazole-trimethoprim (BACTRIM DS,SEPTRA DS) 800-160 MG tablet   Oral   Take 1 tablet by mouth 2 (two) times daily.   30 tablet   1     1tab every Tuesday Thurdays and Saturday until wea ...   . TRESIBA FLEXTOUCH 100 UNIT/ML SOPN      inject 40 units subcutaneously once daily   15 mL   2   . valsartan (DIOVAN) 80 MG tablet   Oral   Take 80 mg by mouth at bedtime.      0     Allergies Oxycodone-acetaminophen and Tape  Family History  Problem Relation Age of Onset  . Diabetes Mother     died @ 46.  [name] Hypertension Mother   . Hypertension Maternal Grandmother     Social History Social History  Substance Use Topics  . Smoking status: Never Smoker   . Smokeless tobacco: Never Used  . Alcohol Use: No    Review of Systems  Constitutional: Negative for fever. Cardiovascular: Negative for chest pain. Respiratory: Negative for shortness of breath. Gastrointestinal: Negative for abdominal pain, vomiting and  diarrhea.Positive for nausea Genitourinary: Negative for dysuria. Musculoskeletal:  Negative for back pain. Skin: Negative for rash. Neurological: Negative for headaches, focal weakness or numbness.  10-point ROS otherwise negative.  ____________________________________________   PHYSICAL EXAM:  VITAL SIGNS: ED Triage Vitals  Enc Vitals Group     BP 03/31/16 1122 138/78 mmHg     Pulse Rate 03/31/16 1122 83     Resp 03/31/16 1122 17     Temp 03/31/16 1122 98.4 F (36.9 C)     Temp Source 03/31/16 1122 Oral     SpO2 03/31/16 1118 97 %     Weight 03/31/16 1122 318 lb 9 oz (144.5 kg)     Height 03/31/16 1122 6\' 1"  (1.854 m)   Constitutional: Alert and oriented. Well appearing and in no distress. Eyes: Conjunctivae are normal. PERRL. Normal extraocular movements. ENT   Head: Normocephalic and atraumatic.   Nose: No congestion/rhinnorhea.   Mouth/Throat: Mucous membranes are moist.   Neck: No stridor. Hematological/Lymphatic/Immunilogical: No cervical lymphadenopathy. Cardiovascular: Normal rate, regular rhythm.  No murmurs, rubs, or gallops. Respiratory: Normal respiratory effort without tachypnea nor retractions. Breath sounds are clear and equal bilaterally. No wheezes/rales/rhonchi. Gastrointestinal: Soft and nontender. No distention. There is no CVA tenderness. Genitourinary: Deferred Musculoskeletal: Normal range of motion in all extremities. No joint effusions.  2+ bilateral lower extremity edema.  Neurologic:  Normal speech and language. No gross focal neurologic deficits are appreciated.  Skin:  Skin is warm, dry and intact. No rash noted. Psychiatric: Mood and affect are normal. Speech and behavior are normal. Patient exhibits appropriate insight and judgment.  ____________________________________________    LABS (pertinent positives/negatives)  Labs Reviewed  BASIC METABOLIC PANEL - Abnormal; Notable for the following:    Chloride 96 (*)    CO2 34 (*)    Glucose, Bld 134 (*)    Creatinine, Ser 6.45 (*)    Calcium 8.3 (*)    GFR  calc non Af Amer 9 (*)    GFR calc Af Amer 10 (*)    All other components within normal limits  CBC - Abnormal; Notable for the following:    RBC 3.44 (*)    Hemoglobin 11.2 (*)    HCT 34.6 (*)    MCV 100.5 (*)    RDW 16.0 (*)    All other components within normal limits  GLUCOSE, CAPILLARY - Abnormal; Notable for the following:    Glucose-Capillary 149 (*)    All other components within normal limits  TROPONIN I  TROPONIN I  URINALYSIS COMPLETEWITH MICROSCOPIC ([facility] ONLY)  CBG MONITORING, ED     ____________________________________________   EKG  I, Phineas Semen, attending physician, personally viewed and interpreted this EKG  EKG Time: 1128 Rate: 83 Rhythm: normal sinus rhythm Axis: left axis deviation Intervals: qtc 479 QRS: LVH ST changes: no st elevation Impression: abnormal ekg ____________________________________________    RADIOLOGY  None  ____________________________________________   PROCEDURES  Procedure(s) performed: None  Critical Care performed: No  ____________________________________________   INITIAL IMPRESSION / ASSESSMENT AND PLAN / ED COURSE  Pertinent labs & imaging results that were available during my care of the patient were reviewed by me and considered in my medical decision making (see chart for details).  Patient presents to the emergency department today because of concerns of an episode of hypotension and near syncope after finishing dialysis. Here in the emergency department patient's vital signs are without concerning findings. Will plan on sending blood work to look for signs of infection.   -----------------------------------------  4:15 PM on 03/31/2016 -----------------------------------------  Patient without leukocytosis on blood work. 2 sets of troponin were sent and both were negative. Patient was ambulated and stated he felt much better. Will plan on discharge and follow-up with primary  care.  ____________________________________________   FINAL CLINICAL IMPRESSION(S) / ED DIAGNOSES  Final diagnoses:  Near syncope     Note: This dictation was prepared with Dragon dictation. Any transcriptional errors that result from this process are unintentional    Phineas SemenGraydon Suzie Vandam, MD 03/31/16 1615

## 2016-03-31 NOTE — ED Notes (Signed)
Pt. Ambulated at baseline with cane and denies dizziness, reports that weakness is his "normal."

## 2016-03-31 NOTE — ED Notes (Signed)
Pt. [facility] from [facility] after completing tx. Nephrologist asked for pt to be transported here for questions of infections, blood cultures drawn at dialysis. Pt. Was afebrile at dialysis, hypotensive, and near syncopal episode was noted. Pt. Reported he just "didn't feel well".

## 2016-03-31 NOTE — Discharge Instructions (Signed)
Please seek medical attention for any high fevers, chest pain, shortness of breath, change in behavior, persistent vomiting, bloody stool or any other new or concerning symptoms. ° ° °Near-Syncope °Near-syncope (commonly known as near fainting) is sudden weakness, dizziness, or feeling like you might pass out. During an episode of near-syncope, you may also develop pale skin, have tunnel vision, or feel sick to your stomach (nauseous). Near-syncope may occur when getting up after sitting or while standing for a long time. It is caused by a sudden decrease in blood flow to the brain. This decrease can result from various causes or triggers, most of which are not serious. However, because near-syncope can sometimes be a sign of something serious, a medical evaluation is required. The specific cause is often not determined. °HOME CARE INSTRUCTIONS  °Monitor your condition for any changes. The following actions may help to alleviate any discomfort you are experiencing: °· Have someone stay with you until you feel stable. °· Lie down right away and prop your feet up if you start feeling like you might faint. Breathe deeply and steadily. Wait until all the symptoms have passed. Most of these episodes last only a few minutes. You may feel tired for several hours.   °· Drink enough fluids to keep your urine clear or pale yellow.   °· If you are taking blood pressure or heart medicine, get up slowly when seated or lying down. Take several minutes to sit and then stand. This can reduce dizziness. °· Follow up with your health care provider as directed.  °SEEK IMMEDIATE MEDICAL CARE IF:  °· You have a severe headache.   °· You have unusual pain in the chest, abdomen, or back.   °· You are bleeding from the mouth or rectum, or you have black or tarry stool.   °· You have an irregular or very fast heartbeat.   °· You have repeated fainting or have seizure-like jerking during an episode.   °· You faint when sitting or lying down.    °· You have confusion.   °· You have difficulty walking.   °· You have severe weakness.   °· You have vision problems.   °MAKE SURE YOU:  °· Understand these instructions. °· Will watch your condition. °· Will get help right away if you are not doing well or get worse. °  °This information is not intended to replace advice given to you by your health care provider. Make sure you discuss any questions you have with your health care provider. °  °Document Released: 09/29/2005 Document Revised: 10/04/2013 Document Reviewed: 03/04/2013 °Elsevier Interactive Patient Education ©2016 [employer] ° °

## 2016-04-01 ENCOUNTER — Telehealth: Payer: Self-pay | Admitting: Internal Medicine

## 2016-04-01 NOTE — Telephone Encounter (Signed)
Judeth CornfieldStephanie from [facility] calling about amendment that is needed for Oxygen Order  We sent ov notes but she did not get order with NPI on it. Needs the NPI number for Dr Dema SeverinMungal written on the order.  She states she faxed over now twice and is following up on this Please advise.  [phone] is the fax number

## 2016-04-01 NOTE — Telephone Encounter (Signed)
I have faxed over order with VM NPI number. Nothing further needed.

## 2016-04-02 DIAGNOSIS — D509 Iron deficiency anemia, unspecified: Secondary | ICD-10-CM | POA: Diagnosis not present

## 2016-04-02 DIAGNOSIS — D631 Anemia in chronic kidney disease: Secondary | ICD-10-CM | POA: Diagnosis not present

## 2016-04-02 DIAGNOSIS — N186 End stage renal disease: Secondary | ICD-10-CM | POA: Diagnosis not present

## 2016-04-02 DIAGNOSIS — N2581 Secondary hyperparathyroidism of renal origin: Secondary | ICD-10-CM | POA: Diagnosis not present

## 2016-04-02 DIAGNOSIS — R531 Weakness: Secondary | ICD-10-CM | POA: Diagnosis not present

## 2016-04-02 DIAGNOSIS — Z992 Dependence on renal dialysis: Secondary | ICD-10-CM | POA: Diagnosis not present

## 2016-04-04 DIAGNOSIS — D631 Anemia in chronic kidney disease: Secondary | ICD-10-CM | POA: Diagnosis not present

## 2016-04-04 DIAGNOSIS — N2581 Secondary hyperparathyroidism of renal origin: Secondary | ICD-10-CM | POA: Diagnosis not present

## 2016-04-04 DIAGNOSIS — Z992 Dependence on renal dialysis: Secondary | ICD-10-CM | POA: Diagnosis not present

## 2016-04-04 DIAGNOSIS — N186 End stage renal disease: Secondary | ICD-10-CM | POA: Diagnosis not present

## 2016-04-04 DIAGNOSIS — D509 Iron deficiency anemia, unspecified: Secondary | ICD-10-CM | POA: Diagnosis not present

## 2016-04-04 DIAGNOSIS — R531 Weakness: Secondary | ICD-10-CM | POA: Diagnosis not present

## 2016-04-07 DIAGNOSIS — D631 Anemia in chronic kidney disease: Secondary | ICD-10-CM | POA: Diagnosis not present

## 2016-04-07 DIAGNOSIS — Z992 Dependence on renal dialysis: Secondary | ICD-10-CM | POA: Diagnosis not present

## 2016-04-07 DIAGNOSIS — N2581 Secondary hyperparathyroidism of renal origin: Secondary | ICD-10-CM | POA: Diagnosis not present

## 2016-04-07 DIAGNOSIS — N186 End stage renal disease: Secondary | ICD-10-CM | POA: Diagnosis not present

## 2016-04-07 DIAGNOSIS — R531 Weakness: Secondary | ICD-10-CM | POA: Diagnosis not present

## 2016-04-07 DIAGNOSIS — D509 Iron deficiency anemia, unspecified: Secondary | ICD-10-CM | POA: Diagnosis not present

## 2016-04-09 DIAGNOSIS — N2581 Secondary hyperparathyroidism of renal origin: Secondary | ICD-10-CM | POA: Diagnosis not present

## 2016-04-09 DIAGNOSIS — D509 Iron deficiency anemia, unspecified: Secondary | ICD-10-CM | POA: Diagnosis not present

## 2016-04-09 DIAGNOSIS — N186 End stage renal disease: Secondary | ICD-10-CM | POA: Diagnosis not present

## 2016-04-09 DIAGNOSIS — R531 Weakness: Secondary | ICD-10-CM | POA: Diagnosis not present

## 2016-04-09 DIAGNOSIS — Z992 Dependence on renal dialysis: Secondary | ICD-10-CM | POA: Diagnosis not present

## 2016-04-09 DIAGNOSIS — D631 Anemia in chronic kidney disease: Secondary | ICD-10-CM | POA: Diagnosis not present

## 2016-04-11 DIAGNOSIS — N186 End stage renal disease: Secondary | ICD-10-CM | POA: Diagnosis not present

## 2016-04-11 DIAGNOSIS — D631 Anemia in chronic kidney disease: Secondary | ICD-10-CM | POA: Diagnosis not present

## 2016-04-11 DIAGNOSIS — N2581 Secondary hyperparathyroidism of renal origin: Secondary | ICD-10-CM | POA: Diagnosis not present

## 2016-04-11 DIAGNOSIS — D509 Iron deficiency anemia, unspecified: Secondary | ICD-10-CM | POA: Diagnosis not present

## 2016-04-11 DIAGNOSIS — Z992 Dependence on renal dialysis: Secondary | ICD-10-CM | POA: Diagnosis not present

## 2016-04-11 DIAGNOSIS — R531 Weakness: Secondary | ICD-10-CM | POA: Diagnosis not present

## 2016-04-13 ENCOUNTER — Other Ambulatory Visit: Payer: Self-pay | Admitting: Family Medicine

## 2016-04-14 DIAGNOSIS — Z992 Dependence on renal dialysis: Secondary | ICD-10-CM | POA: Diagnosis not present

## 2016-04-14 DIAGNOSIS — E8779 Other fluid overload: Secondary | ICD-10-CM | POA: Diagnosis not present

## 2016-04-14 DIAGNOSIS — D631 Anemia in chronic kidney disease: Secondary | ICD-10-CM | POA: Diagnosis not present

## 2016-04-14 DIAGNOSIS — E8770 Fluid overload, unspecified: Secondary | ICD-10-CM | POA: Diagnosis not present

## 2016-04-14 DIAGNOSIS — D509 Iron deficiency anemia, unspecified: Secondary | ICD-10-CM | POA: Diagnosis not present

## 2016-04-14 DIAGNOSIS — N2581 Secondary hyperparathyroidism of renal origin: Secondary | ICD-10-CM | POA: Diagnosis not present

## 2016-04-14 DIAGNOSIS — N186 End stage renal disease: Secondary | ICD-10-CM | POA: Diagnosis not present

## 2016-04-15 DIAGNOSIS — N186 End stage renal disease: Secondary | ICD-10-CM | POA: Diagnosis not present

## 2016-04-15 DIAGNOSIS — E8779 Other fluid overload: Secondary | ICD-10-CM | POA: Diagnosis not present

## 2016-04-15 DIAGNOSIS — D509 Iron deficiency anemia, unspecified: Secondary | ICD-10-CM | POA: Diagnosis not present

## 2016-04-15 DIAGNOSIS — D631 Anemia in chronic kidney disease: Secondary | ICD-10-CM | POA: Diagnosis not present

## 2016-04-15 DIAGNOSIS — N2581 Secondary hyperparathyroidism of renal origin: Secondary | ICD-10-CM | POA: Diagnosis not present

## 2016-04-15 DIAGNOSIS — Z992 Dependence on renal dialysis: Secondary | ICD-10-CM | POA: Diagnosis not present

## 2016-04-16 DIAGNOSIS — Z992 Dependence on renal dialysis: Secondary | ICD-10-CM | POA: Diagnosis not present

## 2016-04-16 DIAGNOSIS — N2581 Secondary hyperparathyroidism of renal origin: Secondary | ICD-10-CM | POA: Diagnosis not present

## 2016-04-16 DIAGNOSIS — D631 Anemia in chronic kidney disease: Secondary | ICD-10-CM | POA: Diagnosis not present

## 2016-04-16 DIAGNOSIS — D509 Iron deficiency anemia, unspecified: Secondary | ICD-10-CM | POA: Diagnosis not present

## 2016-04-16 DIAGNOSIS — E8779 Other fluid overload: Secondary | ICD-10-CM | POA: Diagnosis not present

## 2016-04-16 DIAGNOSIS — N186 End stage renal disease: Secondary | ICD-10-CM | POA: Diagnosis not present

## 2016-04-17 DIAGNOSIS — Z961 Presence of intraocular lens: Secondary | ICD-10-CM | POA: Diagnosis not present

## 2016-04-18 DIAGNOSIS — D509 Iron deficiency anemia, unspecified: Secondary | ICD-10-CM | POA: Diagnosis not present

## 2016-04-18 DIAGNOSIS — N2581 Secondary hyperparathyroidism of renal origin: Secondary | ICD-10-CM | POA: Diagnosis not present

## 2016-04-18 DIAGNOSIS — Z992 Dependence on renal dialysis: Secondary | ICD-10-CM | POA: Diagnosis not present

## 2016-04-18 DIAGNOSIS — D631 Anemia in chronic kidney disease: Secondary | ICD-10-CM | POA: Diagnosis not present

## 2016-04-18 DIAGNOSIS — E8779 Other fluid overload: Secondary | ICD-10-CM | POA: Diagnosis not present

## 2016-04-18 DIAGNOSIS — N186 End stage renal disease: Secondary | ICD-10-CM | POA: Diagnosis not present

## 2016-04-21 DIAGNOSIS — N186 End stage renal disease: Secondary | ICD-10-CM | POA: Diagnosis not present

## 2016-04-21 DIAGNOSIS — D631 Anemia in chronic kidney disease: Secondary | ICD-10-CM | POA: Diagnosis not present

## 2016-04-21 DIAGNOSIS — Z992 Dependence on renal dialysis: Secondary | ICD-10-CM | POA: Diagnosis not present

## 2016-04-21 DIAGNOSIS — E8779 Other fluid overload: Secondary | ICD-10-CM | POA: Diagnosis not present

## 2016-04-21 DIAGNOSIS — D509 Iron deficiency anemia, unspecified: Secondary | ICD-10-CM | POA: Diagnosis not present

## 2016-04-21 DIAGNOSIS — N2581 Secondary hyperparathyroidism of renal origin: Secondary | ICD-10-CM | POA: Diagnosis not present

## 2016-04-23 DIAGNOSIS — D631 Anemia in chronic kidney disease: Secondary | ICD-10-CM | POA: Diagnosis not present

## 2016-04-23 DIAGNOSIS — N2581 Secondary hyperparathyroidism of renal origin: Secondary | ICD-10-CM | POA: Diagnosis not present

## 2016-04-23 DIAGNOSIS — Z992 Dependence on renal dialysis: Secondary | ICD-10-CM | POA: Diagnosis not present

## 2016-04-23 DIAGNOSIS — D509 Iron deficiency anemia, unspecified: Secondary | ICD-10-CM | POA: Diagnosis not present

## 2016-04-23 DIAGNOSIS — E8779 Other fluid overload: Secondary | ICD-10-CM | POA: Diagnosis not present

## 2016-04-23 DIAGNOSIS — N186 End stage renal disease: Secondary | ICD-10-CM | POA: Diagnosis not present

## 2016-04-24 ENCOUNTER — Ambulatory Visit: Payer: BC Managed Care – PPO | Admitting: Family

## 2016-04-24 ENCOUNTER — Other Ambulatory Visit (INDEPENDENT_AMBULATORY_CARE_PROVIDER_SITE_OTHER): Payer: Medicare Other

## 2016-04-24 DIAGNOSIS — E108 Type 1 diabetes mellitus with unspecified complications: Secondary | ICD-10-CM

## 2016-04-24 LAB — GLUCOSE, RANDOM: Glucose, Bld: 172 mg/dL — ABNORMAL HIGH (ref 70–99)

## 2016-04-25 ENCOUNTER — Ambulatory Visit: Payer: Medicare Other | Attending: Family | Admitting: Family

## 2016-04-25 ENCOUNTER — Encounter: Payer: Self-pay | Admitting: Family

## 2016-04-25 VITALS — BP 140/66 | HR 83 | Resp 18 | Ht 73.0 in | Wt 322.0 lb

## 2016-04-25 DIAGNOSIS — Z794 Long term (current) use of insulin: Secondary | ICD-10-CM | POA: Diagnosis not present

## 2016-04-25 DIAGNOSIS — Z9889 Other specified postprocedural states: Secondary | ICD-10-CM | POA: Diagnosis not present

## 2016-04-25 DIAGNOSIS — E785 Hyperlipidemia, unspecified: Secondary | ICD-10-CM | POA: Diagnosis not present

## 2016-04-25 DIAGNOSIS — Z888 Allergy status to other drugs, medicaments and biological substances status: Secondary | ICD-10-CM | POA: Diagnosis not present

## 2016-04-25 DIAGNOSIS — J8489 Other specified interstitial pulmonary diseases: Secondary | ICD-10-CM | POA: Diagnosis not present

## 2016-04-25 DIAGNOSIS — N186 End stage renal disease: Secondary | ICD-10-CM | POA: Diagnosis not present

## 2016-04-25 DIAGNOSIS — D631 Anemia in chronic kidney disease: Secondary | ICD-10-CM | POA: Diagnosis not present

## 2016-04-25 DIAGNOSIS — N2581 Secondary hyperparathyroidism of renal origin: Secondary | ICD-10-CM | POA: Diagnosis not present

## 2016-04-25 DIAGNOSIS — E1122 Type 2 diabetes mellitus with diabetic chronic kidney disease: Secondary | ICD-10-CM | POA: Diagnosis not present

## 2016-04-25 DIAGNOSIS — Z79899 Other long term (current) drug therapy: Secondary | ICD-10-CM | POA: Diagnosis not present

## 2016-04-25 DIAGNOSIS — I5032 Chronic diastolic (congestive) heart failure: Secondary | ICD-10-CM | POA: Diagnosis not present

## 2016-04-25 DIAGNOSIS — E079 Disorder of thyroid, unspecified: Secondary | ICD-10-CM | POA: Diagnosis not present

## 2016-04-25 DIAGNOSIS — G4733 Obstructive sleep apnea (adult) (pediatric): Secondary | ICD-10-CM | POA: Insufficient documentation

## 2016-04-25 DIAGNOSIS — I10 Essential (primary) hypertension: Secondary | ICD-10-CM

## 2016-04-25 DIAGNOSIS — Z992 Dependence on renal dialysis: Secondary | ICD-10-CM | POA: Diagnosis not present

## 2016-04-25 DIAGNOSIS — D509 Iron deficiency anemia, unspecified: Secondary | ICD-10-CM | POA: Diagnosis not present

## 2016-04-25 DIAGNOSIS — I132 Hypertensive heart and chronic kidney disease with heart failure and with stage 5 chronic kidney disease, or end stage renal disease: Secondary | ICD-10-CM | POA: Insufficient documentation

## 2016-04-25 DIAGNOSIS — E8779 Other fluid overload: Secondary | ICD-10-CM | POA: Diagnosis not present

## 2016-04-25 NOTE — Patient Instructions (Signed)
Begin weighing daily and call for an overnight weight gain of > 2 pounds or a weekly weight gain of >5 pounds. 

## 2016-04-25 NOTE — Progress Notes (Signed)
Subjective:    Patient ID: Ryan Arnold, male    DOB: 07-May-1961, 55 y.o.   MRN: [id number]  Congestive Heart Failure Presents for follow-up visit. The disease course has been stable. Associated symptoms include edema, fatigue and shortness of breath. Pertinent negatives include no abdominal pain, chest pain, chest pressure, orthopnea or palpitations. The symptoms have been stable. Past treatments include beta blockers, oxygen, salt and fluid restriction and angiotensin receptor blockers. The treatment provided moderate relief. Compliance with prior treatments has been good. His past medical history is significant for anemia, chronic lung disease, DM and HTN. There is no history of CVA.  Hypertension This is a chronic problem. The current episode started more than 1 year ago. The problem is unchanged. The problem is controlled. Associated symptoms include headaches, peripheral edema and shortness of breath. Pertinent negatives include no chest pain, neck pain or palpitations. There are no associated agents to hypertension. Risk factors for coronary artery disease include diabetes mellitus, dyslipidemia, obesity and male gender. Past treatments include diuretics, lifestyle changes, beta blockers and angiotensin blockers. The current treatment provides moderate improvement. Compliance problems include exercise.  Hypertensive end-organ damage includes kidney disease and heart failure.    Past Medical History  Diagnosis Date  . Essential hypertension   . Hyperlipemia   . End stage renal disease (HCC)     a. on dialysis; still makes urine.  [name] BOOP (bronchiolitis obliterans with organizing pneumonia) (HCC)     a. 01/2018 s/p R thoracoscopy/Bx confirming BOOP;  b. 02/2018 high dose steroids started.  . Chronic diastolic CHF (congestive heart failure) (HCC)     a. 12/2014 Echo: EF 50-55%, mild conc LVH, mildly dil LA.  . Morbid obesity (HCC)   . Secondary hyperparathyroidism (HCC)   . Anemia of  chronic disease   . Recurrent pneumonia     a. 2/2 BOOP.  [name] Chest pain     a. 06/2014 Myoview ([facility]) no ischemia/infarct, nl EF.  . Type 1 diabetes mellitus (HCC)   . Thyroid disease   . OSA treated with BiPAP     Past Surgical History  Procedure Laterality Date  . Cataract Bilateral   . Shoulder surgery Left   . Knee surgery Right   . Gallbladder surgery    . Lung biopsy      Family History  Problem Relation Age of Onset  . Diabetes Mother     died @ 68.  [name] Hypertension Mother   . Hypertension Maternal Grandmother     Social History  Substance Use Topics  . Smoking status: Never Smoker   . Smokeless tobacco: Never Used  . Alcohol Use: No    Allergies  Allergen Reactions  . Oxycodone-Acetaminophen Nausea And Vomiting  . Tape Other (See Comments)    Paper tape caused blisters    Prior to Admission medications   Medication Sig Start Date End Date Taking? Authorizing Provider  albuterol (PROVENTIL HFA) 108 (90 BASE) MCG/ACT inhaler Inhale 2 puffs into the lungs every 4 (four) hours as needed for wheezing or shortness of breath. 09/16/15   Sharman Cheek, MD  allopurinol (ZYLOPRIM) 100 MG tablet Take 1 tablet (100 mg total) by mouth daily. 04/11/15   Delma Freeze, FNP  aspirin 81 MG chewable tablet Chew 1 tablet (81 mg total) by mouth daily. 03/21/15   Enid Baas, MD  atorvastatin (LIPITOR) 40 MG tablet Take 1 tablet (40 mg total) by mouth daily. Patient taking differently: Take 20 mg by mouth  daily.  03/11/16   Tommie SamsJayce G Cook, DO  B-D ULTRAFINE III SHORT PEN 31G X 8 MM MISC use as directed 06/07/15   Tommie SamsJayce G Cook, DO  DULoxetine (CYMBALTA) 60 MG capsule Take 120 mg by mouth daily. 01/02/15   Historical Provider, MD  furosemide (LASIX) 80 MG tablet take 1 tablet by mouth 4 TIMES  A WEEK ,TUESDAY ,THURSDAY ,SATURDAY AND SUNDAY 12/27/15   Delma Freezeina A Hackney, FNP  gabapentin (NEURONTIN) 100 MG capsule Take 100 mg by mouth daily. 12/26/14   Historical Provider, MD  insulin  aspart (NOVOLOG FLEXPEN) 100 UNIT/ML FlexPen INJECT 35 UNITS INTO THE SKIN THREE TIMES DAILY WITH MEALS Patient taking differently: INJECT 38 UNITS INTO THE SKIN TWICE DAILY 02/28/16   Reather LittlerAjay Kumar, MD  metoprolol (LOPRESSOR) 50 MG tablet Take 50 mg by mouth 2 (two) times daily. 12/23/14   Historical Provider, MD  multivitamin (RENA-VIT) TABS tablet Take 1 tablet by mouth daily. 01/09/14   Historical Provider, MD  ONE TOUCH ULTRA TEST test strip TEST BLOOD SUGAR four times a day 04/14/16   Tommie SamsJayce G Cook, DO  pantoprazole (PROTONIX) 40 MG tablet take 1 tablet by mouth once daily 03/20/16   Tommie SamsJayce G Cook, DO  predniSONE (DELTASONE) 5 MG tablet Take 2tabs (10mg ) Every Tuesday, Thursday & Saturday 03/27/16   Stephanie AcreVishal Mungal, MD  SENSIPAR 30 MG tablet Take 30 mg by mouth at bedtime.  12/18/14   Historical Provider, MD  sevelamer carbonate (RENVELA) 800 MG tablet Take 800 mg by mouth 3 (three) times daily. And 2 tablet with snacks    Historical Provider, MD  TRESIBA FLEXTOUCH 100 UNIT/ML SOPN inject 40 units subcutaneously once daily Patient taking differently: Inject 46 units AM & 6 units PM 03/27/16   Reather LittlerAjay Kumar, MD  valsartan (DIOVAN) 80 MG tablet Take 160 mg by mouth at bedtime.  05/29/15   Historical Provider, MD     Review of Systems  Constitutional: Positive for fatigue. Negative for appetite change.  HENT: Negative for congestion, postnasal drip and sore throat.   Eyes: Negative for pain and visual disturbance.  Respiratory: Positive for shortness of breath. Negative for cough, chest tightness and wheezing.   Cardiovascular: Positive for leg swelling. Negative for chest pain and palpitations.  Gastrointestinal: Negative for abdominal pain and abdominal distention.  Endocrine: Negative.   Genitourinary: Negative.   Musculoskeletal: Negative for back pain, neck pain and neck stiffness.  Skin: Negative.   Allergic/Immunologic: Negative.   Neurological: Positive for light-headedness ("after dialysis") and  headaches. Negative for dizziness.  Hematological: Negative for adenopathy. Does not bruise/bleed easily.  Psychiatric/Behavioral: Positive for dysphoric mood. Negative for suicidal ideas and sleep disturbance (sleeping well with bipap and oxygen. sleeping on 2 pillows). The patient is not nervous/anxious.        Objective:   Physical Exam  Constitutional: He is oriented to person, place, and time. He appears well-developed and well-nourished.  HENT:  Head: Normocephalic and atraumatic.  Eyes: Conjunctivae are normal. Pupils are equal, round, and reactive to light.  Neck: Normal range of motion. Neck supple.  Cardiovascular: Normal rate and regular rhythm.   Pulmonary/Chest: Effort normal. He has no wheezes. He has no rales.  Abdominal: Soft. He exhibits no distension. There is no tenderness.  Musculoskeletal: He exhibits edema (2+ pitting edema in bilateral lower legs). He exhibits no tenderness.  Neurological: He is alert and oriented to person, place, and time.  Skin: Skin is warm and dry.  Psychiatric: He has a normal  mood and affect. His behavior is normal. Thought content normal.  Nursing note and vitals reviewed.   BP 140/66 mmHg  Pulse 83  Resp 18  Ht 6\' 1"  (1.854 m)  Wt 322 lb (146.058 kg)  BMI 42.49 kg/m2  SpO2 100%       Assessment & Plan:  1: Chronic heart failure with preserved ejection fraction- Patient presents with fatigue and shortness of breath with moderate exertion (Class II). He does notice that the hot/humid weather tends to make his breathing worse. He admits that he hasn't been weighing himself daily and he was encouraged to resume doing so. By our scale, he's gained 8.8 pounds since he was last here on 12/20/15. Reminded to call for an overnight weight gain of >2 pounds or a weekly weight gain of >5 pounds. He is currently taking his diuretic on his non-dialysis days. Does have chronic edema in his lower legs and he tries to elevate them during the day. He's  not adding any salt to his food and tries to eat low sodium foods. Sees his cardiologist 05/06/16. 2: HTN- Blood pressure looks good today. Continue medications at this time. 3: BOOP- Follows closely with pulmonology regarding this. Wears his oxygen at 2-3L along with Bipap at night. Sees his pulmonologist on 05/08/16. 4: CKD on dialysis- Patient continues with dialysis on M, W, F.  Medication list was reviewed with the patient.  Return in 3 months or sooner for any questions/problems before then.

## 2016-04-28 DIAGNOSIS — E8779 Other fluid overload: Secondary | ICD-10-CM | POA: Diagnosis not present

## 2016-04-28 DIAGNOSIS — N186 End stage renal disease: Secondary | ICD-10-CM | POA: Diagnosis not present

## 2016-04-28 DIAGNOSIS — D509 Iron deficiency anemia, unspecified: Secondary | ICD-10-CM | POA: Diagnosis not present

## 2016-04-28 DIAGNOSIS — N2581 Secondary hyperparathyroidism of renal origin: Secondary | ICD-10-CM | POA: Diagnosis not present

## 2016-04-28 DIAGNOSIS — Z992 Dependence on renal dialysis: Secondary | ICD-10-CM | POA: Diagnosis not present

## 2016-04-28 DIAGNOSIS — D631 Anemia in chronic kidney disease: Secondary | ICD-10-CM | POA: Diagnosis not present

## 2016-04-29 ENCOUNTER — Encounter: Payer: Medicare Other | Attending: Endocrinology | Admitting: Nutrition

## 2016-04-29 ENCOUNTER — Ambulatory Visit (INDEPENDENT_AMBULATORY_CARE_PROVIDER_SITE_OTHER): Payer: Medicare Other | Admitting: Endocrinology

## 2016-04-29 ENCOUNTER — Encounter: Payer: Self-pay | Admitting: Endocrinology

## 2016-04-29 ENCOUNTER — Ambulatory Visit: Payer: BC Managed Care – PPO | Admitting: Endocrinology

## 2016-04-29 VITALS — BP 132/70 | HR 76 | Ht 73.0 in | Wt 323.0 lb

## 2016-04-29 DIAGNOSIS — Z713 Dietary counseling and surveillance: Secondary | ICD-10-CM | POA: Diagnosis not present

## 2016-04-29 DIAGNOSIS — E1065 Type 1 diabetes mellitus with hyperglycemia: Secondary | ICD-10-CM | POA: Insufficient documentation

## 2016-04-29 DIAGNOSIS — E108 Type 1 diabetes mellitus with unspecified complications: Secondary | ICD-10-CM

## 2016-04-29 LAB — POCT GLYCOSYLATED HEMOGLOBIN (HGB A1C): HEMOGLOBIN A1C: 7.9

## 2016-04-29 MED ORDER — GABAPENTIN 300 MG PO CAPS: 300.0000 mg | ORAL_CAPSULE | Freq: Three times a day (TID) | ORAL | Status: DC

## 2016-04-29 MED ORDER — INSULIN REGULAR HUMAN (CONC) 500 UNIT/ML ~~LOC~~ SOPN: PEN_INJECTOR | SUBCUTANEOUS | Status: DC

## 2016-04-29 NOTE — Addendum Note (Signed)
Addended by: Ann MakiBAILEY, Sommer Spickard T on: 04/29/2016 04:26 PM   Modules accepted: Orders

## 2016-04-29 NOTE — Progress Notes (Signed)
Patient ID: Ryan Arnold, male   DOB: 1961-01-10, 55 y.o.   MRN: [id number]    Chief complaint : Follow up of Type 1 Diabetes  History of Present Illness:          Date of diagnosis: Age 33      Prior history:     He has been on various insulin regimens  since diagnosis.   Was on insulin pump 2 years ago, and then taken off pump during hospitalization and never put back on it.  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, and found it slightly more convenient at that time  Recent history:    INSULIN regimen: Tresiba 46 units in a.m -6 in p.m., Novolog 38-44 units before meals  He has been on prednisone since 6/16 for his pulmonary condition and blood sugars have been consistently high  Currently is taking 10 mg 3 days a week and the dose has been lowered  His A1c is 7.9, previously 8.4 but blood sugars at home are averaging well over 200  Current management, blood sugar patterns, problems identified:  He was supposed to be taking 46 units of Tresiba once a day but he is taking another 6 units in the evening  Also he was told by his dialysis nurse to take only 40 units on the morning he comes for dialysis as he tends to get low sometimes during dialysis around 7 AM; however he is generally eating less in the morning before going for dialysis and does not adjust his Novolog on that morning  He appears to have a strong DAWN phenomenon as his blood sugars on the days he does not go for dialysis is averaging over 300.  Otherwise on the early morning blood sugars on dialysis days his blood sugars are averaging about 160  Despite increasing his NovoLog his blood sugars are still markedly increased in the evenings and afternoons.  He does not take any extra NovoLog late at night when blood sugars are higher after the meal  NOVOLOG: He does not always take this for every meal.  He occasionally may get delayed for his evening meal based on his wife's schedule and he thinks that he  will feel hypoglycemic if he is taking it and not able to eat soon.  He is eating out a lot in the evening and last evening had fried chicken.  Otherwise may have biscuits  Dialysis days: Monday/Wednesday/Friday  He refuses to start the insulin pump as he thinks it is too complicated and he does not want to deal with it  Glucose monitoring:  done about 2 times a day         Glucometer: One Touch.      Blood Glucose reading analysis from monitor download as follows:  Mean values apply above for all meters except median for One Touch  PRE-MEAL am/dialysis days  Lunch Dinner Bedtime Overall  Glucose range: 53-387  267-373  160-439  268, 545    Mean/median: 159   338   273    POST-MEAL 9-10 AM  PC Lunch PC Dinner  Glucose range: 92-398     Mean/median: 313      Symptoms of hypoglycemia: Weakness and shakiness  Self-care:   Meal times:  breakfast 5 am on dialysis days otherwise his first meal is at 10 AM-12 noon, dinner 6-7 pm, variable  Exercise:.  unable to do any         Most recent dietitian/nurse educator visit :  Years ago           Diabetes labs:  Lab Results  Component Value Date   HGBA1C 8.4 01/17/2016   HGBA1C 8.4 10/11/2015   HGBA1C 8.7 06/14/2015   Lab Results  Component Value Date   LDLCALC 65 06/14/2015   CREATININE 6.45* 03/31/2016        Medication List       This list is accurate as of: 04/29/16 10:45 AM.  Always use your most recent med list.               albuterol 108 (90 Base) MCG/ACT inhaler  Commonly known as:  PROVENTIL HFA  Inhale 2 puffs into the lungs every 4 (four) hours as needed for wheezing or shortness of breath.     allopurinol 100 MG tablet  Commonly known as:  ZYLOPRIM  Take 1 tablet (100 mg total) by mouth daily.     aspirin 81 MG chewable tablet  Chew 1 tablet (81 mg total) by mouth daily.     atorvastatin 40 MG tablet  Commonly known as:  LIPITOR  Take 1 tablet (40 mg total) by mouth daily.     B-D ULTRAFINE III SHORT  PEN 31G X 8 MM Misc  Generic drug:  Insulin Pen Needle  use as directed     DULoxetine 60 MG capsule  Commonly known as:  CYMBALTA  Take 120 mg by mouth daily.     furosemide 80 MG tablet  Commonly known as:  LASIX  take 1 tablet by mouth 4 TIMES  A WEEK ,TUESDAY ,THURSDAY ,SATURDAY AND SUNDAY     gabapentin 300 MG capsule  Commonly known as:  NEURONTIN  Take 1 capsule (300 mg total) by mouth 3 (three) times daily.     insulin aspart 100 UNIT/ML FlexPen  Commonly known as:  NOVOLOG FLEXPEN  INJECT 35 UNITS INTO THE SKIN THREE TIMES DAILY WITH MEALS     insulin regular human CONCENTRATED 500 UNIT/ML kwikpen  Commonly known as:  HUMULIN R U-500 KWIKPEN  50 Units, 30 minutes before each meal     metoprolol 50 MG tablet  Commonly known as:  LOPRESSOR  Take 50 mg by mouth 2 (two) times daily.     multivitamin Tabs tablet  Take 1 tablet by mouth daily.     ONE TOUCH ULTRA TEST test strip  Generic drug:  glucose blood  TEST BLOOD SUGAR four times a day     pantoprazole 40 MG tablet  Commonly known as:  PROTONIX  take 1 tablet by mouth once daily     predniSONE 5 MG tablet  Commonly known as:  DELTASONE  Take 2tabs (10mg ) Every Tuesday, Thursday & Saturday     SENSIPAR 30 MG tablet  Generic drug:  cinacalcet  Take 30 mg by mouth at bedtime. Reported on 04/29/2016     sevelamer carbonate 800 MG tablet  Commonly known as:  RENVELA  Take 800 mg by mouth 3 (three) times daily. And 2 tablet with snacks     TRESIBA FLEXTOUCH 100 UNIT/ML Sopn  Generic drug:  Insulin Degludec  inject 40 units subcutaneously once daily     valsartan 80 MG tablet  Commonly known as:  DIOVAN  Take 160 mg by mouth at bedtime.        Allergies:  Allergies  Allergen Reactions  . Oxycodone-Acetaminophen Nausea And Vomiting  . Tape Other (See Comments)    Paper tape caused blisters    Past  Medical History  Diagnosis Date  . Essential hypertension   . Hyperlipemia   . End stage renal  disease (HCC)     a. on dialysis; still makes urine.  [name] BOOP (bronchiolitis obliterans with organizing pneumonia) (HCC)     a. 01/2018 s/p R thoracoscopy/Bx confirming BOOP;  b. 02/2018 high dose steroids started.  . Chronic diastolic CHF (congestive heart failure) (HCC)     a. 12/2014 Echo: EF 50-55%, mild conc LVH, mildly dil LA.  . Morbid obesity (HCC)   . Secondary hyperparathyroidism (HCC)   . Anemia of chronic disease   . Recurrent pneumonia     a. 2/2 BOOP.  [name] Chest pain     a. 06/2014 Myoview ([facility]) no ischemia/infarct, nl EF.  . Type 1 diabetes mellitus (HCC)   . Thyroid disease   . OSA treated with BiPAP     Past Surgical History  Procedure Laterality Date  . Cataract Bilateral   . Shoulder surgery Left   . Knee surgery Right   . Gallbladder surgery    . Lung biopsy      Family History  Problem Relation Age of Onset  . Diabetes Mother     died @ 59.  [name] Hypertension Mother   . Hypertension Maternal Grandmother     Social History:  reports that he has never smoked. He has never used smokeless tobacco. He reports that he does not drink alcohol or use illicit drugs.    Review of Systems:   CKD: He is on dialysis, goes on Mondays, Wednesdays and Fridays  He is on steroids for bronchiolitis obliterans and Is on steroid taper  Lipids: controlled on his last labs He thinks he is compliant with his Lipitor  Lab Results  Component Value Date   CHOL 194 06/14/2015   HDL 99.30 06/14/2015   LDLCALC 65 06/14/2015   TRIG 151.0* 06/14/2015   CHOLHDL 2 06/14/2015    Diabetes complications: Nephropathy, Neuropathy  He is getting sharp pains in his legs now, has been given gabapentin but only 100 mg by another physician  Last Eye Exam was in 6/16   Physical Examination:  BP 132/70 mmHg  Pulse 76  Ht  (1.854 m)  Wt 323 lb (146.512 kg)  BMI 42.62 kg/m2  SpO2 96%         ASSESSMENT: See history of present illness for detailed discussion of his current  management, blood sugar patterns and problems identified  Diabetes type 1 with consistently poor control  He is taking inadequate amounts of insulin especially mealtime insulin However his only good blood sugars are early morning before he goes for dialysis although inconsistent also Currently taking Tresiba up to 52 units, taking 6 units in the evening on his own  He thinks he tends to get some hypoglycemia in the mornings if he is having dialysis but does not adjust his Novolog in the morning accordingly, has been told by dialysis staff to reduce [place] on those mornings Even with having Dawn phenomenon he does not want to consider insulin pump again  PLAN:   He will need to cut back on high-fat meals and will see the nurse educator since he did not make appointment with dietitian  Trial of U-500 insulin since he appears to be insulin resistant  Discussed the differences between Novolog and U-500 insulin as well as the timing of the injection.  Will start with 50 units for now but also he will take 20 units if  not eating a meal.  On the mornings of dialysis he can take 30 units only  He will take either 40 or 46 units of Tresiba for now based on dialysis schedule  He will call next week to review blood sugars  Patient Instructions  Tresiba on dialysis days take 40 units in pm Other days take 46 units in pm  U-500 insulin 30 min before meals:  Dialysis days in am take 30 units of U-500  Other days U-500 insilin 50 units before meals  Call readings next week      Counseling time on subjects discussed above is over 50% of today's 25 minute visit     Mallie Giambra 04/29/2016, 10:45 AM

## 2016-04-29 NOTE — Patient Instructions (Signed)
Tresiba on dialysis days take 40 units in pm Other days take 46 units in pm  U-500 insulin 30 min before meals:  Dialysis days in am take 30 units of U-500  Other days U-500 insilin 50 units before meals  Call readings next week

## 2016-04-30 DIAGNOSIS — Z992 Dependence on renal dialysis: Secondary | ICD-10-CM | POA: Diagnosis not present

## 2016-04-30 DIAGNOSIS — E8779 Other fluid overload: Secondary | ICD-10-CM | POA: Diagnosis not present

## 2016-04-30 DIAGNOSIS — D509 Iron deficiency anemia, unspecified: Secondary | ICD-10-CM | POA: Diagnosis not present

## 2016-04-30 DIAGNOSIS — D631 Anemia in chronic kidney disease: Secondary | ICD-10-CM | POA: Diagnosis not present

## 2016-04-30 DIAGNOSIS — N2581 Secondary hyperparathyroidism of renal origin: Secondary | ICD-10-CM | POA: Diagnosis not present

## 2016-04-30 DIAGNOSIS — N186 End stage renal disease: Secondary | ICD-10-CM | POA: Diagnosis not present

## 2016-05-01 DIAGNOSIS — J449 Chronic obstructive pulmonary disease, unspecified: Secondary | ICD-10-CM | POA: Diagnosis not present

## 2016-05-01 DIAGNOSIS — T859XXA Unspecified complication of internal prosthetic device, implant and graft, initial encounter: Secondary | ICD-10-CM | POA: Diagnosis not present

## 2016-05-01 DIAGNOSIS — J8489 Other specified interstitial pulmonary diseases: Secondary | ICD-10-CM | POA: Diagnosis not present

## 2016-05-01 DIAGNOSIS — E669 Obesity, unspecified: Secondary | ICD-10-CM | POA: Diagnosis not present

## 2016-05-01 DIAGNOSIS — Y841 Kidney dialysis as the cause of abnormal reaction of the patient, or of later complication, without mention of misadventure at the time of the procedure: Secondary | ICD-10-CM | POA: Diagnosis not present

## 2016-05-01 DIAGNOSIS — I10 Essential (primary) hypertension: Secondary | ICD-10-CM | POA: Diagnosis not present

## 2016-05-01 DIAGNOSIS — E785 Hyperlipidemia, unspecified: Secondary | ICD-10-CM | POA: Diagnosis not present

## 2016-05-01 DIAGNOSIS — N186 End stage renal disease: Secondary | ICD-10-CM | POA: Diagnosis not present

## 2016-05-02 ENCOUNTER — Telehealth: Payer: Self-pay | Admitting: Endocrinology

## 2016-05-02 DIAGNOSIS — D631 Anemia in chronic kidney disease: Secondary | ICD-10-CM | POA: Diagnosis not present

## 2016-05-02 DIAGNOSIS — N2581 Secondary hyperparathyroidism of renal origin: Secondary | ICD-10-CM | POA: Diagnosis not present

## 2016-05-02 DIAGNOSIS — Z992 Dependence on renal dialysis: Secondary | ICD-10-CM | POA: Diagnosis not present

## 2016-05-02 DIAGNOSIS — D509 Iron deficiency anemia, unspecified: Secondary | ICD-10-CM | POA: Diagnosis not present

## 2016-05-02 DIAGNOSIS — N186 End stage renal disease: Secondary | ICD-10-CM | POA: Diagnosis not present

## 2016-05-02 DIAGNOSIS — E8779 Other fluid overload: Secondary | ICD-10-CM | POA: Diagnosis not present

## 2016-05-02 NOTE — Telephone Encounter (Signed)
I contacted the pt he stated yesterday at 447 pm his blood glucometer read 563 and then after dinner it was 576. Pt agreed to increasing the U-500 to 75-85 units at meals over the weekend and he will call back on Monday to report blood sugar readings.

## 2016-05-02 NOTE — Telephone Encounter (Signed)
Need to know what his blood sugars are on the U-500 He needs to try higher dose of U-500 Regular Insulin before going back to Novolog.  Instead of 50 units at meals he can take 75-85 units and 20 units less on dialysis mornings

## 2016-05-02 NOTE — Telephone Encounter (Signed)
Patient stated b/s is high started back taking the Novalog, stop taking Humulin RU 500. Please advise

## 2016-05-02 NOTE — Telephone Encounter (Signed)
See below to be advised.  

## 2016-05-05 DIAGNOSIS — Z992 Dependence on renal dialysis: Secondary | ICD-10-CM | POA: Diagnosis not present

## 2016-05-05 DIAGNOSIS — D509 Iron deficiency anemia, unspecified: Secondary | ICD-10-CM | POA: Diagnosis not present

## 2016-05-05 DIAGNOSIS — D631 Anemia in chronic kidney disease: Secondary | ICD-10-CM | POA: Diagnosis not present

## 2016-05-05 DIAGNOSIS — N2581 Secondary hyperparathyroidism of renal origin: Secondary | ICD-10-CM | POA: Diagnosis not present

## 2016-05-05 DIAGNOSIS — N186 End stage renal disease: Secondary | ICD-10-CM | POA: Diagnosis not present

## 2016-05-05 DIAGNOSIS — E8779 Other fluid overload: Secondary | ICD-10-CM | POA: Diagnosis not present

## 2016-05-06 ENCOUNTER — Ambulatory Visit (INDEPENDENT_AMBULATORY_CARE_PROVIDER_SITE_OTHER): Payer: Medicare Other | Admitting: Cardiovascular Disease

## 2016-05-06 ENCOUNTER — Encounter: Payer: Self-pay | Admitting: Cardiovascular Disease

## 2016-05-06 VITALS — BP 120/60 | HR 76 | Ht 73.0 in | Wt 323.5 lb

## 2016-05-06 DIAGNOSIS — I493 Ventricular premature depolarization: Secondary | ICD-10-CM

## 2016-05-06 DIAGNOSIS — E785 Hyperlipidemia, unspecified: Secondary | ICD-10-CM

## 2016-05-06 DIAGNOSIS — I10 Essential (primary) hypertension: Secondary | ICD-10-CM | POA: Diagnosis not present

## 2016-05-06 DIAGNOSIS — G4733 Obstructive sleep apnea (adult) (pediatric): Secondary | ICD-10-CM | POA: Diagnosis not present

## 2016-05-06 DIAGNOSIS — Z992 Dependence on renal dialysis: Secondary | ICD-10-CM

## 2016-05-06 DIAGNOSIS — I5032 Chronic diastolic (congestive) heart failure: Secondary | ICD-10-CM

## 2016-05-06 DIAGNOSIS — N186 End stage renal disease: Secondary | ICD-10-CM

## 2016-05-06 NOTE — Patient Instructions (Addendum)
Medication Instructions:   No new medications  Labwork:  No new labs  Testing/Procedures:  No new testing  Follow-Up: It was a pleasure seeing you in the office today. Please call us if you have new issues that need to be addressed before your next appt.  [phone]  Your physician wants you to follow-up in: 12 months.  You will receive a reminder letter in the mail two months in advance. If you don't receive a letter, please call our office to schedule the follow-up appointment.  If you need a refill on your cardiac medications before your next appointment, please call your pharmacy.

## 2016-05-06 NOTE — Progress Notes (Signed)
Cardiology Office Note  Date:  05/06/2016   ID:  Welden, Hausmann April 02, 1961, MRN [id number]  PCP:  Tommie Sams, DO   Chief Complaint  Patient presents with  . Other    6 month follow up. Meds reviewed by the patient verbally. "doing well."     HPI:  55 year old African-American male with morbid obesity, Boop, on chronic prednisone, poorly controlled type 2 diabetes, end-stage renal disease on hemodialysis, hypertension, hyperlipidemia, who presents for follow-up of prior episodes of chest pain   history of sleep apnea, on BiPAP, managed by pulmonary   In follow-up, he reports having rare atypical type chest pain Not associated with exertion, no escalation in symptoms No change from prior clinic visits He has chronic shortness of breath, Tolerating hemodialysis, blood pressure low but stable at the end No recent change to his dry weight Reports his balance is poor, uses a cane, use to work out at [employer], none recently  Dry weight previously increased from 135 kg up to 136 kg by Dr. Cherylann Ratel in early 2017  Suposed to be wearing compression hose, not wearing these consistently, has chronic pitting  leg edema   total cholesterol 194, LDL 65 in 2016 hemoglobin W0J down to 7.9  EKG on today's visit shows normal sinus rhythm with rate 76 bpm, left axis deviation  Other past medical history He was evaluated before at [facility] with a pharmacologic nuclear stress test in September 2015 which showed no evidence of ischemia with normal ejection fraction.   Echocardiogram in March showed normal LV systolic function with mild left ventricular hypertrophy.   He had a sleep study done also recently which showed evidence of severe sleep apnea with a 4 beat run of wide-complex tachycardia.   PMH:   has a past medical history of Anemia of chronic disease; BOOP (bronchiolitis obliterans with organizing pneumonia) (HCC); Chest pain; Chronic diastolic CHF (congestive heart failure) (HCC); End  stage renal disease (HCC); Essential hypertension; Hyperlipemia; Morbid obesity (HCC); OSA treated with BiPAP; Recurrent pneumonia; Secondary hyperparathyroidism (HCC); Thyroid disease; and Type 1 diabetes mellitus (HCC).  PSH:    Past Surgical History:  Procedure Laterality Date  . cataract Bilateral   . GALLBLADDER SURGERY    . KNEE SURGERY Right   . LUNG BIOPSY    . SHOULDER SURGERY Left     Current Outpatient Prescriptions  Medication Sig Dispense Refill  . aspirin 81 MG chewable tablet Chew 1 tablet (81 mg total) by mouth daily. 30 tablet 2  . atorvastatin (LIPITOR) 40 MG tablet Take 1 tablet (40 mg total) by mouth daily. (Patient taking differently: Take 20 mg by mouth daily. ) 90 tablet 3  . B-D ULTRAFINE III SHORT PEN 31G X 8 MM MISC use as directed 100 each 11  . DULoxetine (CYMBALTA) 60 MG capsule Take 120 mg by mouth daily.  0  . furosemide (LASIX) 80 MG tablet take 1 tablet by mouth 4 TIMES  A WEEK ,TUESDAY ,THURSDAY ,SATURDAY AND SUNDAY 36 tablet 3  . gabapentin (NEURONTIN) 300 MG capsule Take 1 capsule (300 mg total) by mouth 3 (three) times daily. 90 capsule 3  . insulin aspart (NOVOLOG FLEXPEN) 100 UNIT/ML FlexPen INJECT 35 UNITS INTO THE SKIN THREE TIMES DAILY WITH MEALS (Patient taking differently: INJECT 38 UNITS INTO THE SKIN TWICE DAILY) 30 mL 3  . insulin regular human CONCENTRATED (HUMULIN R U-500 KWIKPEN) 500 UNIT/ML kwikpen 50 Units, 30 minutes before each meal 2 pen 0  .  metoprolol (LOPRESSOR) 50 MG tablet Take 50 mg by mouth 2 (two) times daily.    . multivitamin (RENA-VIT) TABS tablet Take 1 tablet by mouth daily.    . ONE TOUCH ULTRA TEST test strip TEST BLOOD SUGAR four times a day 100 each 3  . pantoprazole (PROTONIX) 40 MG tablet take 1 tablet by mouth once daily 90 tablet 3  . predniSONE (DELTASONE) 5 MG tablet Take 2tabs (10mg ) Every Tuesday, Thursday & Saturday 40 tablet 0  . SENSIPAR 30 MG tablet Take 30 mg by mouth at bedtime. Reported on 04/29/2016     . sevelamer carbonate (RENVELA) 800 MG tablet Take 800 mg by mouth 3 (three) times daily. And 2 tablet with snacks    . TRESIBA FLEXTOUCH 100 UNIT/ML SOPN inject 40 units subcutaneously once daily (Patient taking differently: Inject 46 units AM & 6 units PM) 15 mL 2  . valsartan (DIOVAN) 80 MG tablet Take 160 mg by mouth at bedtime.   0   No current facility-administered medications for this visit.      Allergies:   Oxycodone-acetaminophen and Tape   Social History:  The patient  reports that he has never smoked. He has never used smokeless tobacco. He reports that he does not drink alcohol or use drugs.   Family History:   family history includes Diabetes in his mother; Hypertension in his maternal grandmother and mother.    Review of Systems: Review of Systems  Constitutional: Negative.   Respiratory: Positive for shortness of breath.   Cardiovascular: Positive for chest pain and leg swelling.  Gastrointestinal: Negative.   Musculoskeletal: Negative.   Neurological:       Poor balance, walks with a cane  Psychiatric/Behavioral: Negative.   All other systems reviewed and are negative.    PHYSICAL EXAM: VS:  BP 120/60 (BP Location: Right Arm, Patient Position: Sitting, Cuff Size: Large)   Pulse 76   Ht 6\' 1"  (1.854 m)   Wt (!) 323 lb 8 oz (146.7 kg)   BMI 42.68 kg/m  , BMI Body mass index is 42.68 kg/m. GEN: Well nourished, well developed, in no acute distress, obese   HEENT: normal  Neck: no JVD, carotid bruits, or masses Cardiac: RRR; no murmurs, rubs, or gallops, trace pitting  edema  bilaterally  Respiratory:  clear to auscultation bilaterally, normal work of breathing GI: soft, nontender, nondistended, + BS MS: no deformity or atrophy  Skin: warm and dry, no rash Neuro:  Strength and sensation are intact Psych: euthymic mood, full affect    Recent Labs: 08/17/2015: ALT 45 09/16/2015: B Natriuretic Peptide 124.0 10/02/2015: TSH 1.70 03/31/2016: BUN 20;  Creatinine, Ser 6.45; Hemoglobin 11.2; Platelets 336; Potassium 4.0; Sodium 140    Lipid Panel Lab Results  Component Value Date   CHOL 194 06/14/2015   HDL 99.30 06/14/2015   LDLCALC 65 06/14/2015   TRIG 151.0 (H) 06/14/2015      Wt Readings from Last 3 Encounters:  05/06/16 (!) 323 lb 8 oz (146.7 kg)  04/29/16 (!) 323 lb (146.5 kg)  04/25/16 (!) 322 lb (146.1 kg)       ASSESSMENT AND PLAN:  Chronic diastolic CHF (congestive heart failure) (HCC) - Plan: EKG 12-Lead Fluid management by hemodialysis He reports symptoms are stable Continues to take Lasix on nondialysis days Morbid Obesity likely contributing to his symptoms  Essential hypertension - Plan: EKG 12-Lead Blood pressure is well controlled on today's visit. No changes made to the medications.  OSA (obstructive  sleep apnea) He uses BiPAP on a regular basis  Hyperlipidemia Encouraged him to stay on his Lipitor Total cholesterol last year 190 Numbers should improve with better diabetes control  CKD (chronic kidney disease) stage V requiring chronic dialysis (HCC) Managed by Dr. Cherylann Ratel, on hemodialysis  Morbid obesity due to excess calories [facility]) Long discussion concerning his diet and the need for regular exercise program for weight loss   dietary guide provided   Total encounter time more than 15 minutes  Greater than 50% was spent in counseling and coordination of care with the patient   Disposition:   F/U  12 months   Orders Placed This Encounter  Procedures  . EKG 12-Lead     Signed, Dossie Arbour, M.D., Ph.D. 05/06/2016  [facility] [place], [place] [phone]

## 2016-05-07 DIAGNOSIS — N2581 Secondary hyperparathyroidism of renal origin: Secondary | ICD-10-CM | POA: Diagnosis not present

## 2016-05-07 DIAGNOSIS — N186 End stage renal disease: Secondary | ICD-10-CM | POA: Diagnosis not present

## 2016-05-07 DIAGNOSIS — D509 Iron deficiency anemia, unspecified: Secondary | ICD-10-CM | POA: Diagnosis not present

## 2016-05-07 DIAGNOSIS — D631 Anemia in chronic kidney disease: Secondary | ICD-10-CM | POA: Diagnosis not present

## 2016-05-07 DIAGNOSIS — Z992 Dependence on renal dialysis: Secondary | ICD-10-CM | POA: Diagnosis not present

## 2016-05-07 DIAGNOSIS — E8779 Other fluid overload: Secondary | ICD-10-CM | POA: Diagnosis not present

## 2016-05-08 ENCOUNTER — Encounter: Payer: Self-pay | Admitting: Internal Medicine

## 2016-05-08 ENCOUNTER — Ambulatory Visit (INDEPENDENT_AMBULATORY_CARE_PROVIDER_SITE_OTHER): Payer: Medicare Other | Admitting: Internal Medicine

## 2016-05-08 DIAGNOSIS — J8489 Other specified interstitial pulmonary diseases: Secondary | ICD-10-CM | POA: Diagnosis not present

## 2016-05-08 MED ORDER — PREDNISONE 5 MG PO TABS: ORAL_TABLET | ORAL | 1 refills | Status: DC

## 2016-05-08 NOTE — Progress Notes (Signed)
MRN# [id number] Ryan Arnold March 22, 1961   CC: Chief Complaint  Patient presents with  . Follow-up    6wk rov.  breathing has improved. not wearing 02 with exertion. no new concerns    Synopsis: 55 year old male past medical history of end-stage renal disease on dialysis, uncontrolled diabetes, hypertension, former retired Emergency planning/management officer, seen in consultation for recurrent pneumonias. Multiple hospitalizations at [facility] and at [facility] over the past one year for bilateral groundglass opacities throughout entire lung fields requiring multiple rounds of antibiotics and steroids. Had a bronchoscopy done at [facility] and within the last 2 years no acute findings from the BAL. Patient with recent bronchoscopy, March 2016, at [facility] by Dr. Dema Severin, BAL with no acute findings. April 2016 surgical lung bx (wedge) by Dr. Thelma Barge, path confirms BOOP, started on high dose steroids.   Events since last clinic visit: Patient presents today for a BOOP follow up. Overall he is doing well, over the past 2-3 weeks he's been checking his oxygen saturations greater than 90% without any supplemental oxygen. He is using prednisone 10 mg on Tuesday, Thursday, Saturday. Overall no shortness of breath worsening, no cough, no sputum production.    Medication:   Current Outpatient Rx  . Order #: [id number] Class: Print  . Order #: [id number] Class: Normal  . Order #: [id number] Class: Normal  . Order #: [id number] Class: Historical Med  . Order #: [id number] Class: Normal  . Order #: [id number] Class: Normal  . Order #: [id number] Class: Normal  . Order #: [id number] Class: Normal  . Order #: [id number] Class: Historical Med  . Order #: [id number] Class: Historical Med  . Order #: [id number] Class: Normal  . Order #: [id number] Class: Normal  . Order #: [id number] Class: Normal  . Order #: [id number] Class: Historical Med  . Order #: [id number] Class: Historical Med  . Order #: [id number] Class: Normal  . Order #: [id number] Class: Historical Med      Review of Systems: Gen:  Denies  fever, sweats, chills HEENT: Denies blurred vision, double vision, ear pain, eye pain, hearing loss, nose bleeds, sore throat Cvc:  No dizziness, chest pain or heaviness Resp:   Admits MM:NOTRRNH dyspnea Gi: Denies swallowing difficulty, stomach pain, nausea or vomiting, diarrhea, constipation, bowel incontinence Gu:  Denies bladder incontinence, burning urine Ext:   No Joint pain, stiffness or swelling Skin: No skin rash, easy bruising or bleeding or hives Endoc:  No polyuria, polydipsia , polyphagia or weight change Other:  All other systems negative  Allergies:  Oxycodone-acetaminophen and Tape  Physical Examination:  VS: BP 136/70 (BP Location: Left Arm, Cuff Size: Normal)   Pulse 85   Ht 6\' 1"  (1.854 m)   Wt (!) 324 lb (147 kg)   SpO2 92%   BMI 42.75 kg/m   General Appearance: No distress  HEENT: PERRLA, no ptosis, no other lesions noticed Pulmonary: Good respiratory effort, mild decreased breath sounds at the bases(this is baseline), no wheezes, no crackles Cardiovascular:  Normal S1,S2.  No m/r/g.     Abdomen:Exam: Benign, Soft, non-tender, No masses  Skin:   warm, no ecchymosis, fine milia appearance lesions on the face and forehead (no erythema, no eruption) Extremities: normal, no cyanosis, clubbing, warm with normal capillary refill.      Rad results: (The following images and results were reviewed by Dr. Dema Severin on 05/08/2016). CXR 01/22/16 EXAM: CHEST 2 VIEW  COMPARISON: 12/09/2015  FINDINGS: Mild cardiomegaly. Areas of scarring in the right lung are stable. Left lung is clear. No effusions. No acute bony abnormality.  IMPRESSION: Cardiomegaly. Chronic changes.  No active disease..    Assessment and Plan:55 yo male seen in follow up for BOOP. Patient doing well on current dose. No exacerbations of SOB, no signs of infection at this time. Recommend slow taper as previously discussed with pateint   BOOP  (bronchiolitis obliterans with organizing pneumonia) BOOP - surgical biopsy proven Unknown trigger. Today we reduced prednisone to 5 mg on Tu, Th, and Saturday until follow up in 4-6 weeks.  Also today, has not been requiring supplemental oxygen, on room air his oxygen saturation was greater than 90%.   Plan: - prednisone to 5 mg on Tu, Th, and Saturday until follow up in 6 weeks. . Patient is advised if symptoms return, and is persistent for 3-4 days, then increase prednisone to 10 mg daily until follow-up visit - If there is another relapse of BOOP, will have to consider another bronchoscopy to rule out atypical infections exacerbating BOOP. - continue with 2-3L O2 with exertion and with sleep, follow-up with sleep doctor for further recommendations - 6 minute walk test and in office spirometry prior to follow-up visit

## 2016-05-08 NOTE — Assessment & Plan Note (Addendum)
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 Today we reduced prednisone to 5 mg on Tu, Th, and Saturday until follow up in 4-6 weeks.  Also today, has not been requiring supplemental oxygen, on room air his oxygen saturation was greater than 90%.   Plan: - prednisone to 5 mg on Tu, Th, and Saturday until follow up in 6 weeks. . Patient is advised if symptoms return, and is persistent for 3-4 days, then increase prednisone to 10 mg daily until follow-up visit - If there is another relapse of BOOP, will have to consider another bronchoscopy to rule out atypical infections exacerbating BOOP. - continue with 2-3L O2 with exertion and with sleep, follow-up with sleep doctor for further recommendations - 6 minute walk test and in office spirometry prior to follow-up visit

## 2016-05-08 NOTE — Patient Instructions (Addendum)
Follow up with Dr. Dema Severin in:1 months - Prednisone 5mg  on Tuesday, Thursday, Saturdays - walk test and spirometry prior to follow up visit - Continue with supplemental oxygen at night for OSA

## 2016-05-09 DIAGNOSIS — D509 Iron deficiency anemia, unspecified: Secondary | ICD-10-CM | POA: Diagnosis not present

## 2016-05-09 DIAGNOSIS — E8779 Other fluid overload: Secondary | ICD-10-CM | POA: Diagnosis not present

## 2016-05-09 DIAGNOSIS — N186 End stage renal disease: Secondary | ICD-10-CM | POA: Diagnosis not present

## 2016-05-09 DIAGNOSIS — Z992 Dependence on renal dialysis: Secondary | ICD-10-CM | POA: Diagnosis not present

## 2016-05-09 DIAGNOSIS — N2581 Secondary hyperparathyroidism of renal origin: Secondary | ICD-10-CM | POA: Diagnosis not present

## 2016-05-09 DIAGNOSIS — D631 Anemia in chronic kidney disease: Secondary | ICD-10-CM | POA: Diagnosis not present

## 2016-05-12 DIAGNOSIS — Z992 Dependence on renal dialysis: Secondary | ICD-10-CM | POA: Diagnosis not present

## 2016-05-12 DIAGNOSIS — D631 Anemia in chronic kidney disease: Secondary | ICD-10-CM | POA: Diagnosis not present

## 2016-05-12 DIAGNOSIS — N186 End stage renal disease: Secondary | ICD-10-CM | POA: Diagnosis not present

## 2016-05-12 DIAGNOSIS — E8779 Other fluid overload: Secondary | ICD-10-CM | POA: Diagnosis not present

## 2016-05-12 DIAGNOSIS — N2581 Secondary hyperparathyroidism of renal origin: Secondary | ICD-10-CM | POA: Diagnosis not present

## 2016-05-12 DIAGNOSIS — D509 Iron deficiency anemia, unspecified: Secondary | ICD-10-CM | POA: Diagnosis not present

## 2016-05-14 DIAGNOSIS — N2581 Secondary hyperparathyroidism of renal origin: Secondary | ICD-10-CM | POA: Diagnosis not present

## 2016-05-14 DIAGNOSIS — N186 End stage renal disease: Secondary | ICD-10-CM | POA: Diagnosis not present

## 2016-05-14 DIAGNOSIS — D509 Iron deficiency anemia, unspecified: Secondary | ICD-10-CM | POA: Diagnosis not present

## 2016-05-14 DIAGNOSIS — D631 Anemia in chronic kidney disease: Secondary | ICD-10-CM | POA: Diagnosis not present

## 2016-05-14 DIAGNOSIS — Z992 Dependence on renal dialysis: Secondary | ICD-10-CM | POA: Diagnosis not present

## 2016-05-16 DIAGNOSIS — D631 Anemia in chronic kidney disease: Secondary | ICD-10-CM | POA: Diagnosis not present

## 2016-05-16 DIAGNOSIS — Z992 Dependence on renal dialysis: Secondary | ICD-10-CM | POA: Diagnosis not present

## 2016-05-16 DIAGNOSIS — N2581 Secondary hyperparathyroidism of renal origin: Secondary | ICD-10-CM | POA: Diagnosis not present

## 2016-05-16 DIAGNOSIS — N186 End stage renal disease: Secondary | ICD-10-CM | POA: Diagnosis not present

## 2016-05-16 DIAGNOSIS — D509 Iron deficiency anemia, unspecified: Secondary | ICD-10-CM | POA: Diagnosis not present

## 2016-05-19 DIAGNOSIS — D631 Anemia in chronic kidney disease: Secondary | ICD-10-CM | POA: Diagnosis not present

## 2016-05-19 DIAGNOSIS — D509 Iron deficiency anemia, unspecified: Secondary | ICD-10-CM | POA: Diagnosis not present

## 2016-05-19 DIAGNOSIS — N2581 Secondary hyperparathyroidism of renal origin: Secondary | ICD-10-CM | POA: Diagnosis not present

## 2016-05-19 DIAGNOSIS — N186 End stage renal disease: Secondary | ICD-10-CM | POA: Diagnosis not present

## 2016-05-19 DIAGNOSIS — Z992 Dependence on renal dialysis: Secondary | ICD-10-CM | POA: Diagnosis not present

## 2016-05-20 DIAGNOSIS — Z23 Encounter for immunization: Secondary | ICD-10-CM | POA: Diagnosis not present

## 2016-05-21 DIAGNOSIS — D509 Iron deficiency anemia, unspecified: Secondary | ICD-10-CM | POA: Diagnosis not present

## 2016-05-21 DIAGNOSIS — N186 End stage renal disease: Secondary | ICD-10-CM | POA: Diagnosis not present

## 2016-05-21 DIAGNOSIS — N2581 Secondary hyperparathyroidism of renal origin: Secondary | ICD-10-CM | POA: Diagnosis not present

## 2016-05-21 DIAGNOSIS — D631 Anemia in chronic kidney disease: Secondary | ICD-10-CM | POA: Diagnosis not present

## 2016-05-21 DIAGNOSIS — Z992 Dependence on renal dialysis: Secondary | ICD-10-CM | POA: Diagnosis not present

## 2016-05-23 DIAGNOSIS — D631 Anemia in chronic kidney disease: Secondary | ICD-10-CM | POA: Diagnosis not present

## 2016-05-23 DIAGNOSIS — N2581 Secondary hyperparathyroidism of renal origin: Secondary | ICD-10-CM | POA: Diagnosis not present

## 2016-05-23 DIAGNOSIS — D509 Iron deficiency anemia, unspecified: Secondary | ICD-10-CM | POA: Diagnosis not present

## 2016-05-23 DIAGNOSIS — Z992 Dependence on renal dialysis: Secondary | ICD-10-CM | POA: Diagnosis not present

## 2016-05-23 DIAGNOSIS — N186 End stage renal disease: Secondary | ICD-10-CM | POA: Diagnosis not present

## 2016-05-26 DIAGNOSIS — D509 Iron deficiency anemia, unspecified: Secondary | ICD-10-CM | POA: Diagnosis not present

## 2016-05-26 DIAGNOSIS — N186 End stage renal disease: Secondary | ICD-10-CM | POA: Diagnosis not present

## 2016-05-26 DIAGNOSIS — D631 Anemia in chronic kidney disease: Secondary | ICD-10-CM | POA: Diagnosis not present

## 2016-05-26 DIAGNOSIS — Z992 Dependence on renal dialysis: Secondary | ICD-10-CM | POA: Diagnosis not present

## 2016-05-26 DIAGNOSIS — N2581 Secondary hyperparathyroidism of renal origin: Secondary | ICD-10-CM | POA: Diagnosis not present

## 2016-05-28 DIAGNOSIS — N186 End stage renal disease: Secondary | ICD-10-CM | POA: Diagnosis not present

## 2016-05-28 DIAGNOSIS — D509 Iron deficiency anemia, unspecified: Secondary | ICD-10-CM | POA: Diagnosis not present

## 2016-05-28 DIAGNOSIS — D631 Anemia in chronic kidney disease: Secondary | ICD-10-CM | POA: Diagnosis not present

## 2016-05-28 DIAGNOSIS — N2581 Secondary hyperparathyroidism of renal origin: Secondary | ICD-10-CM | POA: Diagnosis not present

## 2016-05-28 DIAGNOSIS — Z992 Dependence on renal dialysis: Secondary | ICD-10-CM | POA: Diagnosis not present

## 2016-05-29 ENCOUNTER — Ambulatory Visit (INDEPENDENT_AMBULATORY_CARE_PROVIDER_SITE_OTHER): Payer: Medicare Other | Admitting: Endocrinology

## 2016-05-29 ENCOUNTER — Encounter: Payer: Self-pay | Admitting: Endocrinology

## 2016-05-29 VITALS — BP 125/59 | HR 81 | Ht 73.0 in | Wt 325.0 lb

## 2016-05-29 DIAGNOSIS — E1065 Type 1 diabetes mellitus with hyperglycemia: Secondary | ICD-10-CM | POA: Diagnosis not present

## 2016-05-29 NOTE — Patient Instructions (Addendum)
No fried food  Get Calorie Brooke DareKing app on your phone to look up nutritional values and avoid high-fat foods  Must inject before supper   Novolog 42-48 units at supper based on type of meals, carbohydrates and any fat intake also just the dose also based on blood sugar reading before eating

## 2016-05-29 NOTE — Progress Notes (Signed)
Patient ID: Ryan Arnold, male   DOB: 04/29/1961, 55 y.o.   MRN: [id number]    Chief complaint : Follow up of Type 1 Diabetes  History of Present Illness:          Date of diagnosis: Age 55      Prior history:     He has been on various insulin regimens  since diagnosis.   Was on insulin pump 2 years ago, and then taken off pump during hospitalization and never put back on it.  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, and found it slightly more convenient at that time  Recent history:    INSULIN regimen: Tresiba 40 qd., Novolog 38 units before meals  He has been on prednisone since 6/16 for his pulmonary condition and blood sugars have been consistently high  Currently is taking 5 mg 3 days a week and the dose has been lowered  His A1c is 7.9 in July, previously 8.4 but blood sugars at home are averaging  over 200 He was given a trial of U-500 insulin but with the starting dose as his blood sugars were higher and he refused to continue the insulin as a pharmacist told him that insulin was dangerous  Current management, blood sugar patterns, problems identified:  He was supposed to be taking 46 units of Tresiba once a day but he is taking only 40 units now in the evening   FASTING readings recently are fairly good although sporadically over 200 and not clear why   Blood sugars are markedly increased after supper again although occasionally at bedtime blood sugars are relatively better  He is not marking his readings as postprandial in the evening; also has not started using the Verio meter that was given  He thinks that sometimes he has problems with injecting his abdomen because of scar tissue  NOVOLOG: He does not always take this for every meal.  He thinks he is eating a very small lunch or nothing and sometimes will eat only a banana for breakfast   He occasionally may not take NovoLog for his evening meal and does not like to take it with him to the  restaurant  His diet is variable and sometimes will eat fatty meats of fried fish and has not tried to improve his diet  He refuses to see the dietitian  Sometimes may have biscuits  Dialysis days: Monday/Wednesday/Friday  Glucose monitoring:  done about 2 times a day         Glucometer: One Touch.      Blood Glucose reading analysis from monitor download as follows:  Mean values apply above for all meters except median for One Touch  PRE-MEAL Fasting Lunch Dinner Bedtime Overall  Glucose range: 75-241   87-576  150-563    Mean/median: 150    260  223   Symptoms of hypoglycemia: Weakness and shakiness  Self-care:   Meal times:  breakfast 5 am on dialysis days otherwise his first meal is at 10 AM-12 noon, dinner 6-7 pm, variable  Exercise: minimal.         Most recent dietitian/nurse educator visit : Years ago           Wt Readings from Last 3 Encounters:  05/29/16 (!) 325 lb (147.4 kg)  05/08/16 (!) 324 lb (147 kg)  05/06/16 (!) 323 lb 8 oz (146.7 kg)   Diabetes labs:  Lab Results  Component Value Date   HGBA1C 7.9 04/29/2016  HGBA1C 8.4 01/17/2016   HGBA1C 8.4 10/11/2015   Lab Results  Component Value Date   LDLCALC 65 06/14/2015   CREATININE 6.45 (H) 03/31/2016        Medication List       Accurate as of 05/29/16 10:19 AM. Always use your most recent med list.          aspirin 81 MG chewable tablet Chew 1 tablet (81 mg total) by mouth daily.   atorvastatin 40 MG tablet Commonly known as:  LIPITOR Take 1 tablet (40 mg total) by mouth daily.   B-D ULTRAFINE III SHORT PEN 31G X 8 MM Misc Generic drug:  Insulin Pen Needle use as directed   DULoxetine 60 MG capsule Commonly known as:  CYMBALTA Take 120 mg by mouth daily.   furosemide 80 MG tablet Commonly known as:  LASIX take 1 tablet by mouth 4 TIMES  A WEEK ,TUESDAY ,THURSDAY ,SATURDAY AND SUNDAY   gabapentin 300 MG capsule Commonly known as:  NEURONTIN Take 1 capsule (300 mg total) by  mouth 3 (three) times daily.   insulin aspart 100 UNIT/ML FlexPen Commonly known as:  NOVOLOG FLEXPEN INJECT 35 UNITS INTO THE SKIN THREE TIMES DAILY WITH MEALS   metoprolol 50 MG tablet Commonly known as:  LOPRESSOR Take 50 mg by mouth 2 (two) times daily.   multivitamin Tabs tablet Take 1 tablet by mouth daily.   ONE TOUCH ULTRA TEST test strip Generic drug:  glucose blood TEST BLOOD SUGAR four times a day   pantoprazole 40 MG tablet Commonly known as:  PROTONIX take 1 tablet by mouth once daily   predniSONE 5 MG tablet Commonly known as:  DELTASONE Take 1 tab (10mg ) Every Tuesday, Thursday & Saturday   SENSIPAR 30 MG tablet Generic drug:  cinacalcet Take 30 mg by mouth at bedtime. Reported on 04/29/2016   sevelamer carbonate 800 MG tablet Commonly known as:  RENVELA Take 800 mg by mouth 3 (three) times daily. And 2 tablet with snacks   TRESIBA FLEXTOUCH 100 UNIT/ML Sopn Generic drug:  Insulin Degludec inject 40 units subcutaneously once daily   valsartan 80 MG tablet Commonly known as:  DIOVAN Take 160 mg by mouth at bedtime.       Allergies:  Allergies  Allergen Reactions  . Oxycodone-Acetaminophen Nausea And Vomiting  . Tape Other (See Comments)    Paper tape caused blisters    Past Medical History:  Diagnosis Date  . Anemia of chronic disease   . BOOP (bronchiolitis obliterans with organizing pneumonia) (HCC)    a. 01/2018 s/p R thoracoscopy/Bx confirming BOOP;  b. 02/2018 high dose steroids started.  . Chest pain    a. 06/2014 Myoview ([facility]) no ischemia/infarct, nl EF.  [name]. Chronic diastolic CHF (congestive heart failure) (HCC)    a. 12/2014 Echo: EF 50-55%, mild conc LVH, mildly dil LA.  . End stage renal disease (HCC)    a. on dialysis; still makes urine.  . Essential hypertension   . Hyperlipemia   . Morbid obesity (HCC)   . OSA treated with BiPAP   . Recurrent pneumonia    a. 2/2 BOOP.  [name]. Secondary hyperparathyroidism (HCC)   . Thyroid disease    . Type 1 diabetes mellitus (HCC)     Past Surgical History:  Procedure Laterality Date  . cataract Bilateral   . GALLBLADDER SURGERY    . KNEE SURGERY Right   . LUNG BIOPSY    . SHOULDER SURGERY Left  Family History  Problem Relation Age of Onset  . Diabetes Mother     died @ 55.  [name] Hypertension Mother   . Hypertension Maternal Grandmother     Social History:  reports that he has never smoked. He has never used smokeless tobacco. He reports that he does not drink alcohol or use drugs.    Review of Systems:   CKD: He is on dialysis, goes on Mondays, Wednesdays and Fridays  He is on steroids for bronchiolitis obliterans and Is on steroid taper  Lipids: controlled on his last labs He thinks he is compliant with his Lipitor  Lab Results  Component Value Date   CHOL 194 06/14/2015   HDL 99.30 06/14/2015   LDLCALC 65 06/14/2015   TRIG 151.0 (H) 06/14/2015   CHOLHDL 2 06/14/2015    Diabetes complications: Nephropathy, Neuropathy  He is getting sharp pains in his legs now, has been given gabapentin but only 100 mg by another physician  Last Eye Exam was in 6/16   Physical Examination:  BP (!) 125/59   Pulse 81   Ht 6\' 1"  (1.854 m)   Wt (!) 325 lb (147.4 kg)   BMI 42.88 kg/m          ASSESSMENT: See history of present illness for detailed discussion of his current management, blood sugar patterns and problems identified  Diabetes type 1 with consistently poor control  He is taking inadequate amounts of insulin To cover his evening meal and also the timing of his insulin is not consistent His diet is still poor with eating out and not eating low fat meals usually Recently fasting readings are relatively better although occasionally high He is having some difficulty rotating insulin injection sites  PLAN:   He will need to cut back on high-fat meals and will try to use the calorie Brooke Dare app  Continue Tresiba at 40 units unless fasting readings start  going up  Cover breakfast and lunch meals more consistently  Take at least 42 units of Novolog at suppertime, up to 48 if higher fat.  Also had extra insulin for higher reading before eating  Needs to check sugars more often before and after meals  Patient Instructions  No fried food  Get Calorie Brooke Dare app on your phone to look up nutritional values and avoid high-fat foods  Must inject before supper   Novolog 42-48 units at supper based on type of meals, carbohydrates and any fat intake also just the dose also based on blood sugar reading before eating          Counseling time on subjects discussed above is over 50% of today's 25 minute visit     Lillion Elbert 05/29/2016, 10:19 AM

## 2016-05-30 DIAGNOSIS — N2581 Secondary hyperparathyroidism of renal origin: Secondary | ICD-10-CM | POA: Diagnosis not present

## 2016-05-30 DIAGNOSIS — Z992 Dependence on renal dialysis: Secondary | ICD-10-CM | POA: Diagnosis not present

## 2016-05-30 DIAGNOSIS — N186 End stage renal disease: Secondary | ICD-10-CM | POA: Diagnosis not present

## 2016-05-30 DIAGNOSIS — D631 Anemia in chronic kidney disease: Secondary | ICD-10-CM | POA: Diagnosis not present

## 2016-05-30 DIAGNOSIS — D509 Iron deficiency anemia, unspecified: Secondary | ICD-10-CM | POA: Diagnosis not present

## 2016-06-02 DIAGNOSIS — D509 Iron deficiency anemia, unspecified: Secondary | ICD-10-CM | POA: Diagnosis not present

## 2016-06-02 DIAGNOSIS — N2581 Secondary hyperparathyroidism of renal origin: Secondary | ICD-10-CM | POA: Diagnosis not present

## 2016-06-02 DIAGNOSIS — Z992 Dependence on renal dialysis: Secondary | ICD-10-CM | POA: Diagnosis not present

## 2016-06-02 DIAGNOSIS — D631 Anemia in chronic kidney disease: Secondary | ICD-10-CM | POA: Diagnosis not present

## 2016-06-02 DIAGNOSIS — N186 End stage renal disease: Secondary | ICD-10-CM | POA: Diagnosis not present

## 2016-06-03 ENCOUNTER — Ambulatory Visit: Payer: BC Managed Care – PPO | Admitting: Family Medicine

## 2016-06-03 ENCOUNTER — Ambulatory Visit (INDEPENDENT_AMBULATORY_CARE_PROVIDER_SITE_OTHER): Payer: Medicare Other | Admitting: Family Medicine

## 2016-06-03 ENCOUNTER — Encounter: Payer: Self-pay | Admitting: Family Medicine

## 2016-06-03 DIAGNOSIS — E785 Hyperlipidemia, unspecified: Secondary | ICD-10-CM | POA: Diagnosis not present

## 2016-06-03 DIAGNOSIS — J8489 Other specified interstitial pulmonary diseases: Secondary | ICD-10-CM | POA: Diagnosis not present

## 2016-06-03 DIAGNOSIS — I10 Essential (primary) hypertension: Secondary | ICD-10-CM | POA: Diagnosis not present

## 2016-06-03 DIAGNOSIS — E108 Type 1 diabetes mellitus with unspecified complications: Secondary | ICD-10-CM | POA: Diagnosis not present

## 2016-06-03 MED ORDER — DULOXETINE HCL 60 MG PO CPEP: 120.0000 mg | ORAL_CAPSULE | Freq: Every day | ORAL | 2 refills | Status: DC

## 2016-06-03 NOTE — Assessment & Plan Note (Signed)
Well controlled/at goal. Continue lipitor.

## 2016-06-03 NOTE — Assessment & Plan Note (Signed)
Mildly elevated today. Volume slightly up. Continue current meds. HD tomorrow.

## 2016-06-03 NOTE — Progress Notes (Signed)
Pre visit review using our clinic review tool, if applicable. No additional management support is needed unless otherwise documented below in the visit note. 

## 2016-06-03 NOTE — Progress Notes (Signed)
Subjective:  Patient ID: Ryan FellsMyron W Steward, male    DOB: 11/28/1960  Age: 55 y.o. MRN: [id number]  CC: Follow up   HPI:  55 year old male with HTN, HLD, OSA, CKD on HD, BOOP presents for follow up.  HTN  Has been stable.  On Metoprolol, Lasix, Valsartan.  HLD  At goal on Lipitor 40 mg daily.  DM  Reasonable control.  Difficult in the setting of chronic steroid use due to BOOP.  Followed by Endo. Last A1C was 7.9 (04/29/16).  BOOP  Stable.  Pulm decreasing steroids.   Currently on 5 mg three times weekly.   Social Hx   Social History   Social History  . Marital status: Married    Spouse name: N/A  . Number of children: N/A  . Years of education: N/A   Social History Main Topics  . Smoking status: Never Smoker  . Smokeless tobacco: Never Used  . Alcohol use No  . Drug use: No  . Sexual activity: Not Asked   Other Topics Concern  . None   Social History Narrative   Retired Emergency planning/management officerolice Officer.  Does not routinely exercise.   Review of Systems  Constitutional: Positive for fatigue.  Respiratory: Positive for shortness of breath.    Objective:  BP (!) 151/77 (BP Location: Right Arm, Patient Position: Sitting, Cuff Size: Large)   Pulse 82   Temp 98.3 F (36.8 C) (Oral)   Wt (!) 325 lb (147.4 kg)   SpO2 93%   BMI 42.88 kg/m   BP/Weight 06/03/2016 05/29/2016 05/08/2016  Systolic BP 151 125 136  Diastolic BP 77 59 70  Wt. (Lbs) 325 325 324  BMI 42.88 42.88 42.75   Physical Exam  Constitutional: He is oriented to person, place, and time. He appears well-developed. No distress.  Cardiovascular: Normal rate and regular rhythm.   Pulmonary/Chest: Effort normal. He has no wheezes. He has no rales.  Neurological: He is alert and oriented to person, place, and time.  Psychiatric: He has a normal mood and affect.  Vitals reviewed.  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 Anticipate will reach goal when off steroids. Continue current regimen per Endo.      Hyperlipidemia    Well controlled/at goal. Continue lipitor.      Essential hypertension    Mildly elevated today. Volume slightly up. Continue current meds. HD tomorrow.      BOOP (bronchiolitis obliterans with organizing pneumonia) (HCC)    Stable. Continue decreasing steroids per pulm.       Other Visit Diagnoses   None.    Meds ordered this encounter  Medications  . DULoxetine (CYMBALTA) 60 MG capsule    Sig: Take  2 capsules (120 mg total) by mouth daily.    Dispense:  90 capsule    Refill:  2    Follow-up: 1 year (as he has several specialists managing).  Everlene OtherJayce Serjio Deupree DO [facility] [place]

## 2016-06-03 NOTE — Patient Instructions (Signed)
Continue your current meds,  Follow up in 1 year.   Take care  Dr. Adriana Simasook

## 2016-06-03 NOTE — Assessment & Plan Note (Signed)
Stable. Continue decreasing steroids per pulm.

## 2016-06-03 NOTE — Assessment & Plan Note (Signed)
Improving. Anticipate will reach goal when off steroids. Continue current regimen per Endo.

## 2016-06-04 DIAGNOSIS — D631 Anemia in chronic kidney disease: Secondary | ICD-10-CM | POA: Diagnosis not present

## 2016-06-04 DIAGNOSIS — N186 End stage renal disease: Secondary | ICD-10-CM | POA: Diagnosis not present

## 2016-06-04 DIAGNOSIS — N2581 Secondary hyperparathyroidism of renal origin: Secondary | ICD-10-CM | POA: Diagnosis not present

## 2016-06-04 DIAGNOSIS — D509 Iron deficiency anemia, unspecified: Secondary | ICD-10-CM | POA: Diagnosis not present

## 2016-06-04 DIAGNOSIS — Z992 Dependence on renal dialysis: Secondary | ICD-10-CM | POA: Diagnosis not present

## 2016-06-05 NOTE — Assessment & Plan Note (Signed)
[name] was shown the new U500 insulin pens and how to use them.  I explained how this insulin is different that his old insulin, and the importance of being very careful the taking the correct insulin dose.  He is using the same injection sites on his abdomen, and he has developed hardened areas.  I explained that these areas will not absorb the insulin well anymore, and he was shown different sites to use on his abdomen and side areas that are higher and lower than the sites he is currently using.  He agreed to try these areas, and was warned to stay away for those hardened areas on his abdomen.  He agreed to do this.   We discussed his diet.  He was given suggestions for lower fat options, and choices, like light salad dressing, and mayo, and choosing baked in stead of fried foods.  He was told to have only one fried/high fat food per day.  We also discussed eating out guidelines for eating lower fat.  He  Had no final questions

## 2016-06-06 DIAGNOSIS — N186 End stage renal disease: Secondary | ICD-10-CM | POA: Diagnosis not present

## 2016-06-06 DIAGNOSIS — D509 Iron deficiency anemia, unspecified: Secondary | ICD-10-CM | POA: Diagnosis not present

## 2016-06-06 DIAGNOSIS — N2581 Secondary hyperparathyroidism of renal origin: Secondary | ICD-10-CM | POA: Diagnosis not present

## 2016-06-06 DIAGNOSIS — Z992 Dependence on renal dialysis: Secondary | ICD-10-CM | POA: Diagnosis not present

## 2016-06-06 DIAGNOSIS — D631 Anemia in chronic kidney disease: Secondary | ICD-10-CM | POA: Diagnosis not present

## 2016-06-09 DIAGNOSIS — D631 Anemia in chronic kidney disease: Secondary | ICD-10-CM | POA: Diagnosis not present

## 2016-06-09 DIAGNOSIS — Z992 Dependence on renal dialysis: Secondary | ICD-10-CM | POA: Diagnosis not present

## 2016-06-09 DIAGNOSIS — N2581 Secondary hyperparathyroidism of renal origin: Secondary | ICD-10-CM | POA: Diagnosis not present

## 2016-06-09 DIAGNOSIS — N186 End stage renal disease: Secondary | ICD-10-CM | POA: Diagnosis not present

## 2016-06-09 DIAGNOSIS — D509 Iron deficiency anemia, unspecified: Secondary | ICD-10-CM | POA: Diagnosis not present

## 2016-06-10 ENCOUNTER — Ambulatory Visit (INDEPENDENT_AMBULATORY_CARE_PROVIDER_SITE_OTHER): Payer: Medicare Other | Admitting: Internal Medicine

## 2016-06-10 ENCOUNTER — Encounter: Payer: Self-pay | Admitting: Internal Medicine

## 2016-06-10 VITALS — BP 138/66 | HR 78 | Ht 73.0 in | Wt 321.6 lb

## 2016-06-10 DIAGNOSIS — J8489 Other specified interstitial pulmonary diseases: Secondary | ICD-10-CM | POA: Diagnosis not present

## 2016-06-10 NOTE — Patient Instructions (Addendum)
Follow up with Dr. Dema SeverinMungal in:6 weeks - Prednisone 5mg  on Tuesday, Thursday, Saturdays.  If shortness of breath returns, then call us back and we will increase prednisone to 7.5mg  or Tu, Th, Sat.  - 6min walk test and in office spirometry prior to follow up visit - Continue with supplemental oxygen at night for OSA

## 2016-06-10 NOTE — Assessment & Plan Note (Addendum)
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 Today we will continue with prednisone to 5 mg on Tu, Th, and Saturday until follow up in 6 weeks. He had some symptoms after a 50% reduction at last visit, will go a little slower this time around.  Plan for Prednisone 2.5mg  at follow up visit, hopefully he will tolerate this small dose.  Only requiring O2 at night and with intense exertion.    Plan: - prednisone to 5 mg on Tu, Th, and Saturday until follow up in 6 weeks. . Patient is advised if symptoms return, and is persistent for 3-4 days, then increase prednisone to 7.5mg  on Tu, Th, Sat.  - If there is another relapse of BOOP, will have to consider another bronchoscopy to rule out atypical infections exacerbating BOOP. - continue with 2-3L O2 with exertion and with sleep, follow-up with sleep doctor for further recommendations - 6 minute walk test and in office spirometry prior to follow-up visit

## 2016-06-10 NOTE — Progress Notes (Signed)
MRN# [id number] Ryan FellsMyron W Arnold 07/29/1961   CC: Chief Complaint  Patient presents with  . Follow-up    pt has no complaints today.     Synopsis: 55 year old male past medical history of end-stage renal disease on dialysis, uncontrolled diabetes, hypertension, former retired Emergency planning/management officerpolice officer, seen in consultation for recurrent pneumonias. [facility] and [facility] over the past one year for bilateral groundglass opacities throughout entire lung fields requiring multiple rounds of antibiotics and steroids. [facility] and within the last 2 years no acute findings from the BAL. Patient with recent bronchoscopy, March 2016, [facility] by Dr. Dema SeverinMungal, BAL with no acute findings. April 2016 surgical lung bx (wedge) by Dr. [name], path confirms BOOP, started on high dose steroids.   Events since last clinic visit: Patient presents today for a BOOP follow up. Overall he is doing well, after last visit his prednisone was decreased and he had SOB increased, but tolerated, for about 1-2 weeks and then adjusted to the 5mg  dose, still only wearing O2 at night and with intense exertion, this is an improvement for him.  He is using prednisone 5 mg on Tuesday, Thursday, Saturday. Overall no shortness of breath worsening, no cough, no sputum production.    Medication:    Current Outpatient Prescriptions:  .  aspirin 81 MG chewable tablet, Chew 1 tablet (81 mg total) by mouth daily., Disp: 30 tablet, Rfl: 2 .  atorvastatin (LIPITOR) 40 MG tablet, Take 1 tablet (40 mg total) by mouth daily. (Patient taking differently: Take 20 mg by mouth daily. ), Disp: 90 tablet, Rfl: 3 .  B-D ULTRAFINE III SHORT PEN 31G X 8 MM MISC, use as directed, Disp: 100 each, Rfl: 11 .  DULoxetine (CYMBALTA) 60 MG capsule, Take 2 capsules (120 mg total) by mouth daily., Disp: 90 capsule, Rfl: 2 .  furosemide (LASIX) 80 MG tablet, take 1 tablet by mouth 4 TIMES  A WEEK ,TUESDAY ,THURSDAY ,SATURDAY AND  SUNDAY, Disp: 36 tablet, Rfl: 3 .  gabapentin (NEURONTIN) 300 MG capsule, Take 1 capsule (300 mg total) by mouth 3 (three) times daily., Disp: 90 capsule, Rfl: 3 .  insulin aspart (NOVOLOG FLEXPEN) 100 UNIT/ML FlexPen, INJECT 35 UNITS INTO THE SKIN THREE TIMES DAILY WITH MEALS (Patient taking differently: INJECT 38 UNITS INTO THE SKIN TWICE DAILY), Disp: 30 mL, Rfl: 3 .  metoprolol (LOPRESSOR) 50 MG tablet, Take 50 mg by mouth 2 (two) times daily., Disp: , Rfl:  .  multivitamin (RENA-VIT) TABS tablet, Take 1 tablet by mouth daily., Disp: , Rfl:  .  ONE TOUCH ULTRA TEST test strip, TEST BLOOD SUGAR four times a day, Disp: 100 each, Rfl: 3 .  pantoprazole (PROTONIX) 40 MG tablet, take 1 tablet by mouth once daily, Disp: 90 tablet, Rfl: 3 .  predniSONE (DELTASONE) 5 MG tablet, Take 1 tab (10mg ) Every Tuesday, Thursday & Saturday, Disp: 40 tablet, Rfl: 1 .  SENSIPAR 30 MG tablet, Take 30 mg by mouth at bedtime. Reported on 04/29/2016, Disp: , Rfl:  .  sevelamer carbonate (RENVELA) 800 MG tablet, Take 800 mg by mouth 3 (three) times daily. And 2 tablet with snacks, Disp: , Rfl:  .  TRESIBA FLEXTOUCH 100 UNIT/ML SOPN, inject 40 units subcutaneously once daily (Patient taking differently: Inject 46 units AM & 6 units PM), Disp: 15 mL, Rfl: 2 .  valsartan (DIOVAN) 80 MG tablet, Take 160 mg by mouth at bedtime. , Disp: , Rfl: 0  Review of Systems: Gen:  Denies  fever, sweats, chills HEENT: Denies blurred vision, double vision, ear pain, eye pain, hearing loss, nose bleeds, sore throat Cvc:  No dizziness, chest pain or heaviness Resp:   Admits ZO:XWRUEAV dyspnea (improving) Gi: Denies swallowing difficulty, stomach pain, nausea or vomiting, diarrhea, constipation, bowel incontinence Gu:  Denies bladder incontinence, burning urine Ext:   No Joint pain, stiffness or swelling Skin: No skin rash, easy bruising or bleeding or hives Endoc:  No polyuria, polydipsia , polyphagia or weight change Other:  All  other systems negative  Allergies:  Oxycodone-acetaminophen and Tape  Physical Examination:  VS: BP 138/66 (BP Location: Right Leg, Cuff Size: Normal)   Pulse 78   Ht 6\' 1"  (1.854 m)   Wt (!) 321 lb 9.6 oz (145.9 kg)   SpO2 97%   BMI 42.43 kg/m   General Appearance: No distress  HEENT: PERRLA, no ptosis, no other lesions noticed Pulmonary: Good respiratory effort, mild decreased breath sounds at the bases(this is baseline), no wheezes, no crackles Cardiovascular:  Normal S1,S2.  No m/r/g.     Abdomen:Exam: Benign, Soft, non-tender, No masses  Skin:   warm, no ecchymosis, fine milia appearance lesions on the face and forehead (no erythema, no eruption),with significant improvement Extremities: normal, no cyanosis, clubbing, warm with normal capillary refill.      Rad results: (The following images and results were reviewed by Dr. Dema Severin on 06/10/2016). CXR 01/22/16 EXAM: CHEST 2 VIEW  COMPARISON: 12/09/2015  FINDINGS: Mild cardiomegaly. Areas of scarring in the right lung are stable. Left lung is clear. No effusions. No acute bony abnormality.  IMPRESSION: Cardiomegaly. Chronic changes. No active disease..    Assessment and Plan:55 yo male seen in follow up for BOOP. Patient doing well on current dose. No exacerbations of SOB, no signs of infection at this time. 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 Today we will continue with prednisone to 5 mg on Tu, Th, and Saturday until follow up in 6 weeks. He had some symptoms after a 50% reduction at last visit, will go a little slower this time around.  Plan for Prednisone 2.5mg  at follow up visit, hopefully he will tolerate this small dose.  Only requiring O2 at night and with intense exertion.    Plan: - prednisone to 5 mg on Tu, Th, and Saturday until follow up in 6 weeks. . Patient is advised  if symptoms return, and is persistent for 3-4 days, then increase prednisone to 7.5mg  on Tu, Th, Sat.  - If there is another relapse of BOOP, will have to consider another bronchoscopy to rule out atypical infections exacerbating BOOP. - continue with 2-3L O2 with exertion and with sleep, follow-up with sleep doctor for further recommendations - 6 minute walk test and in office spirometry prior to follow-up visit

## 2016-06-11 DIAGNOSIS — Z992 Dependence on renal dialysis: Secondary | ICD-10-CM | POA: Diagnosis not present

## 2016-06-11 DIAGNOSIS — N186 End stage renal disease: Secondary | ICD-10-CM | POA: Diagnosis not present

## 2016-06-11 DIAGNOSIS — D509 Iron deficiency anemia, unspecified: Secondary | ICD-10-CM | POA: Diagnosis not present

## 2016-06-11 DIAGNOSIS — D631 Anemia in chronic kidney disease: Secondary | ICD-10-CM | POA: Diagnosis not present

## 2016-06-11 DIAGNOSIS — N2581 Secondary hyperparathyroidism of renal origin: Secondary | ICD-10-CM | POA: Diagnosis not present

## 2016-06-12 ENCOUNTER — Ambulatory Visit: Payer: BC Managed Care – PPO

## 2016-06-12 DIAGNOSIS — N186 End stage renal disease: Secondary | ICD-10-CM | POA: Diagnosis not present

## 2016-06-12 DIAGNOSIS — Z992 Dependence on renal dialysis: Secondary | ICD-10-CM | POA: Diagnosis not present

## 2016-06-13 DIAGNOSIS — D509 Iron deficiency anemia, unspecified: Secondary | ICD-10-CM | POA: Diagnosis not present

## 2016-06-13 DIAGNOSIS — Z992 Dependence on renal dialysis: Secondary | ICD-10-CM | POA: Diagnosis not present

## 2016-06-13 DIAGNOSIS — Z23 Encounter for immunization: Secondary | ICD-10-CM | POA: Diagnosis not present

## 2016-06-13 DIAGNOSIS — D631 Anemia in chronic kidney disease: Secondary | ICD-10-CM | POA: Diagnosis not present

## 2016-06-13 DIAGNOSIS — N186 End stage renal disease: Secondary | ICD-10-CM | POA: Diagnosis not present

## 2016-06-13 DIAGNOSIS — N2581 Secondary hyperparathyroidism of renal origin: Secondary | ICD-10-CM | POA: Diagnosis not present

## 2016-06-16 DIAGNOSIS — Z23 Encounter for immunization: Secondary | ICD-10-CM | POA: Diagnosis not present

## 2016-06-16 DIAGNOSIS — D509 Iron deficiency anemia, unspecified: Secondary | ICD-10-CM | POA: Diagnosis not present

## 2016-06-16 DIAGNOSIS — Z992 Dependence on renal dialysis: Secondary | ICD-10-CM | POA: Diagnosis not present

## 2016-06-16 DIAGNOSIS — D631 Anemia in chronic kidney disease: Secondary | ICD-10-CM | POA: Diagnosis not present

## 2016-06-16 DIAGNOSIS — N2581 Secondary hyperparathyroidism of renal origin: Secondary | ICD-10-CM | POA: Diagnosis not present

## 2016-06-16 DIAGNOSIS — N186 End stage renal disease: Secondary | ICD-10-CM | POA: Diagnosis not present

## 2016-06-18 DIAGNOSIS — N186 End stage renal disease: Secondary | ICD-10-CM | POA: Diagnosis not present

## 2016-06-18 DIAGNOSIS — N2581 Secondary hyperparathyroidism of renal origin: Secondary | ICD-10-CM | POA: Diagnosis not present

## 2016-06-18 DIAGNOSIS — Z992 Dependence on renal dialysis: Secondary | ICD-10-CM | POA: Diagnosis not present

## 2016-06-18 DIAGNOSIS — D509 Iron deficiency anemia, unspecified: Secondary | ICD-10-CM | POA: Diagnosis not present

## 2016-06-18 DIAGNOSIS — D631 Anemia in chronic kidney disease: Secondary | ICD-10-CM | POA: Diagnosis not present

## 2016-06-18 DIAGNOSIS — Z23 Encounter for immunization: Secondary | ICD-10-CM | POA: Diagnosis not present

## 2016-06-20 DIAGNOSIS — N2581 Secondary hyperparathyroidism of renal origin: Secondary | ICD-10-CM | POA: Diagnosis not present

## 2016-06-20 DIAGNOSIS — Z992 Dependence on renal dialysis: Secondary | ICD-10-CM | POA: Diagnosis not present

## 2016-06-20 DIAGNOSIS — D509 Iron deficiency anemia, unspecified: Secondary | ICD-10-CM | POA: Diagnosis not present

## 2016-06-20 DIAGNOSIS — N186 End stage renal disease: Secondary | ICD-10-CM | POA: Diagnosis not present

## 2016-06-20 DIAGNOSIS — Z23 Encounter for immunization: Secondary | ICD-10-CM | POA: Diagnosis not present

## 2016-06-20 DIAGNOSIS — D631 Anemia in chronic kidney disease: Secondary | ICD-10-CM | POA: Diagnosis not present

## 2016-06-22 DIAGNOSIS — D509 Iron deficiency anemia, unspecified: Secondary | ICD-10-CM | POA: Diagnosis not present

## 2016-06-22 DIAGNOSIS — D631 Anemia in chronic kidney disease: Secondary | ICD-10-CM | POA: Diagnosis not present

## 2016-06-22 DIAGNOSIS — Z23 Encounter for immunization: Secondary | ICD-10-CM | POA: Diagnosis not present

## 2016-06-22 DIAGNOSIS — Z992 Dependence on renal dialysis: Secondary | ICD-10-CM | POA: Diagnosis not present

## 2016-06-22 DIAGNOSIS — N2581 Secondary hyperparathyroidism of renal origin: Secondary | ICD-10-CM | POA: Diagnosis not present

## 2016-06-22 DIAGNOSIS — N186 End stage renal disease: Secondary | ICD-10-CM | POA: Diagnosis not present

## 2016-06-25 DIAGNOSIS — Z23 Encounter for immunization: Secondary | ICD-10-CM | POA: Diagnosis not present

## 2016-06-25 DIAGNOSIS — N186 End stage renal disease: Secondary | ICD-10-CM | POA: Diagnosis not present

## 2016-06-25 DIAGNOSIS — D509 Iron deficiency anemia, unspecified: Secondary | ICD-10-CM | POA: Diagnosis not present

## 2016-06-25 DIAGNOSIS — Z992 Dependence on renal dialysis: Secondary | ICD-10-CM | POA: Diagnosis not present

## 2016-06-25 DIAGNOSIS — D631 Anemia in chronic kidney disease: Secondary | ICD-10-CM | POA: Diagnosis not present

## 2016-06-25 DIAGNOSIS — N2581 Secondary hyperparathyroidism of renal origin: Secondary | ICD-10-CM | POA: Diagnosis not present

## 2016-06-27 DIAGNOSIS — N186 End stage renal disease: Secondary | ICD-10-CM | POA: Diagnosis not present

## 2016-06-27 DIAGNOSIS — D509 Iron deficiency anemia, unspecified: Secondary | ICD-10-CM | POA: Diagnosis not present

## 2016-06-27 DIAGNOSIS — D631 Anemia in chronic kidney disease: Secondary | ICD-10-CM | POA: Diagnosis not present

## 2016-06-27 DIAGNOSIS — N2581 Secondary hyperparathyroidism of renal origin: Secondary | ICD-10-CM | POA: Diagnosis not present

## 2016-06-27 DIAGNOSIS — Z992 Dependence on renal dialysis: Secondary | ICD-10-CM | POA: Diagnosis not present

## 2016-06-27 DIAGNOSIS — Z23 Encounter for immunization: Secondary | ICD-10-CM | POA: Diagnosis not present

## 2016-06-30 DIAGNOSIS — Z992 Dependence on renal dialysis: Secondary | ICD-10-CM | POA: Diagnosis not present

## 2016-06-30 DIAGNOSIS — D631 Anemia in chronic kidney disease: Secondary | ICD-10-CM | POA: Diagnosis not present

## 2016-06-30 DIAGNOSIS — Z23 Encounter for immunization: Secondary | ICD-10-CM | POA: Diagnosis not present

## 2016-06-30 DIAGNOSIS — N2581 Secondary hyperparathyroidism of renal origin: Secondary | ICD-10-CM | POA: Diagnosis not present

## 2016-06-30 DIAGNOSIS — N186 End stage renal disease: Secondary | ICD-10-CM | POA: Diagnosis not present

## 2016-06-30 DIAGNOSIS — D509 Iron deficiency anemia, unspecified: Secondary | ICD-10-CM | POA: Diagnosis not present

## 2016-07-02 DIAGNOSIS — Z992 Dependence on renal dialysis: Secondary | ICD-10-CM | POA: Diagnosis not present

## 2016-07-02 DIAGNOSIS — D631 Anemia in chronic kidney disease: Secondary | ICD-10-CM | POA: Diagnosis not present

## 2016-07-02 DIAGNOSIS — Z23 Encounter for immunization: Secondary | ICD-10-CM | POA: Diagnosis not present

## 2016-07-02 DIAGNOSIS — N186 End stage renal disease: Secondary | ICD-10-CM | POA: Diagnosis not present

## 2016-07-02 DIAGNOSIS — N2581 Secondary hyperparathyroidism of renal origin: Secondary | ICD-10-CM | POA: Diagnosis not present

## 2016-07-02 DIAGNOSIS — D509 Iron deficiency anemia, unspecified: Secondary | ICD-10-CM | POA: Diagnosis not present

## 2016-07-04 DIAGNOSIS — D509 Iron deficiency anemia, unspecified: Secondary | ICD-10-CM | POA: Diagnosis not present

## 2016-07-04 DIAGNOSIS — Z23 Encounter for immunization: Secondary | ICD-10-CM | POA: Diagnosis not present

## 2016-07-04 DIAGNOSIS — Z992 Dependence on renal dialysis: Secondary | ICD-10-CM | POA: Diagnosis not present

## 2016-07-04 DIAGNOSIS — N186 End stage renal disease: Secondary | ICD-10-CM | POA: Diagnosis not present

## 2016-07-04 DIAGNOSIS — N2581 Secondary hyperparathyroidism of renal origin: Secondary | ICD-10-CM | POA: Diagnosis not present

## 2016-07-04 DIAGNOSIS — D631 Anemia in chronic kidney disease: Secondary | ICD-10-CM | POA: Diagnosis not present

## 2016-07-07 DIAGNOSIS — N2581 Secondary hyperparathyroidism of renal origin: Secondary | ICD-10-CM | POA: Diagnosis not present

## 2016-07-07 DIAGNOSIS — Z992 Dependence on renal dialysis: Secondary | ICD-10-CM | POA: Diagnosis not present

## 2016-07-07 DIAGNOSIS — D631 Anemia in chronic kidney disease: Secondary | ICD-10-CM | POA: Diagnosis not present

## 2016-07-07 DIAGNOSIS — Z23 Encounter for immunization: Secondary | ICD-10-CM | POA: Diagnosis not present

## 2016-07-07 DIAGNOSIS — N186 End stage renal disease: Secondary | ICD-10-CM | POA: Diagnosis not present

## 2016-07-07 DIAGNOSIS — D509 Iron deficiency anemia, unspecified: Secondary | ICD-10-CM | POA: Diagnosis not present

## 2016-07-09 DIAGNOSIS — N2581 Secondary hyperparathyroidism of renal origin: Secondary | ICD-10-CM | POA: Diagnosis not present

## 2016-07-09 DIAGNOSIS — D631 Anemia in chronic kidney disease: Secondary | ICD-10-CM | POA: Diagnosis not present

## 2016-07-09 DIAGNOSIS — D509 Iron deficiency anemia, unspecified: Secondary | ICD-10-CM | POA: Diagnosis not present

## 2016-07-09 DIAGNOSIS — Z992 Dependence on renal dialysis: Secondary | ICD-10-CM | POA: Diagnosis not present

## 2016-07-09 DIAGNOSIS — Z23 Encounter for immunization: Secondary | ICD-10-CM | POA: Diagnosis not present

## 2016-07-09 DIAGNOSIS — N186 End stage renal disease: Secondary | ICD-10-CM | POA: Diagnosis not present

## 2016-07-11 DIAGNOSIS — N2581 Secondary hyperparathyroidism of renal origin: Secondary | ICD-10-CM | POA: Diagnosis not present

## 2016-07-11 DIAGNOSIS — D509 Iron deficiency anemia, unspecified: Secondary | ICD-10-CM | POA: Diagnosis not present

## 2016-07-11 DIAGNOSIS — Z23 Encounter for immunization: Secondary | ICD-10-CM | POA: Diagnosis not present

## 2016-07-11 DIAGNOSIS — N186 End stage renal disease: Secondary | ICD-10-CM | POA: Diagnosis not present

## 2016-07-11 DIAGNOSIS — Z992 Dependence on renal dialysis: Secondary | ICD-10-CM | POA: Diagnosis not present

## 2016-07-11 DIAGNOSIS — D631 Anemia in chronic kidney disease: Secondary | ICD-10-CM | POA: Diagnosis not present

## 2016-07-12 DIAGNOSIS — N186 End stage renal disease: Secondary | ICD-10-CM | POA: Diagnosis not present

## 2016-07-12 DIAGNOSIS — Z992 Dependence on renal dialysis: Secondary | ICD-10-CM | POA: Diagnosis not present

## 2016-07-14 DIAGNOSIS — N186 End stage renal disease: Secondary | ICD-10-CM | POA: Diagnosis not present

## 2016-07-14 DIAGNOSIS — D509 Iron deficiency anemia, unspecified: Secondary | ICD-10-CM | POA: Diagnosis not present

## 2016-07-14 DIAGNOSIS — D631 Anemia in chronic kidney disease: Secondary | ICD-10-CM | POA: Diagnosis not present

## 2016-07-14 DIAGNOSIS — Z992 Dependence on renal dialysis: Secondary | ICD-10-CM | POA: Diagnosis not present

## 2016-07-14 DIAGNOSIS — N2581 Secondary hyperparathyroidism of renal origin: Secondary | ICD-10-CM | POA: Diagnosis not present

## 2016-07-16 DIAGNOSIS — D631 Anemia in chronic kidney disease: Secondary | ICD-10-CM | POA: Diagnosis not present

## 2016-07-16 DIAGNOSIS — D509 Iron deficiency anemia, unspecified: Secondary | ICD-10-CM | POA: Diagnosis not present

## 2016-07-16 DIAGNOSIS — N186 End stage renal disease: Secondary | ICD-10-CM | POA: Diagnosis not present

## 2016-07-16 DIAGNOSIS — N2581 Secondary hyperparathyroidism of renal origin: Secondary | ICD-10-CM | POA: Diagnosis not present

## 2016-07-16 DIAGNOSIS — Z992 Dependence on renal dialysis: Secondary | ICD-10-CM | POA: Diagnosis not present

## 2016-07-18 DIAGNOSIS — D631 Anemia in chronic kidney disease: Secondary | ICD-10-CM | POA: Diagnosis not present

## 2016-07-18 DIAGNOSIS — N2581 Secondary hyperparathyroidism of renal origin: Secondary | ICD-10-CM | POA: Diagnosis not present

## 2016-07-18 DIAGNOSIS — D509 Iron deficiency anemia, unspecified: Secondary | ICD-10-CM | POA: Diagnosis not present

## 2016-07-18 DIAGNOSIS — N186 End stage renal disease: Secondary | ICD-10-CM | POA: Diagnosis not present

## 2016-07-18 DIAGNOSIS — Z992 Dependence on renal dialysis: Secondary | ICD-10-CM | POA: Diagnosis not present

## 2016-07-19 ENCOUNTER — Other Ambulatory Visit: Payer: Self-pay | Admitting: Endocrinology

## 2016-07-21 DIAGNOSIS — D509 Iron deficiency anemia, unspecified: Secondary | ICD-10-CM | POA: Diagnosis not present

## 2016-07-21 DIAGNOSIS — N2581 Secondary hyperparathyroidism of renal origin: Secondary | ICD-10-CM | POA: Diagnosis not present

## 2016-07-21 DIAGNOSIS — N186 End stage renal disease: Secondary | ICD-10-CM | POA: Diagnosis not present

## 2016-07-21 DIAGNOSIS — Z992 Dependence on renal dialysis: Secondary | ICD-10-CM | POA: Diagnosis not present

## 2016-07-21 DIAGNOSIS — D631 Anemia in chronic kidney disease: Secondary | ICD-10-CM | POA: Diagnosis not present

## 2016-07-23 DIAGNOSIS — D631 Anemia in chronic kidney disease: Secondary | ICD-10-CM | POA: Diagnosis not present

## 2016-07-23 DIAGNOSIS — D509 Iron deficiency anemia, unspecified: Secondary | ICD-10-CM | POA: Diagnosis not present

## 2016-07-23 DIAGNOSIS — Z992 Dependence on renal dialysis: Secondary | ICD-10-CM | POA: Diagnosis not present

## 2016-07-23 DIAGNOSIS — N186 End stage renal disease: Secondary | ICD-10-CM | POA: Diagnosis not present

## 2016-07-23 DIAGNOSIS — N2581 Secondary hyperparathyroidism of renal origin: Secondary | ICD-10-CM | POA: Diagnosis not present

## 2016-07-25 DIAGNOSIS — D631 Anemia in chronic kidney disease: Secondary | ICD-10-CM | POA: Diagnosis not present

## 2016-07-25 DIAGNOSIS — D509 Iron deficiency anemia, unspecified: Secondary | ICD-10-CM | POA: Diagnosis not present

## 2016-07-25 DIAGNOSIS — Z992 Dependence on renal dialysis: Secondary | ICD-10-CM | POA: Diagnosis not present

## 2016-07-25 DIAGNOSIS — N2581 Secondary hyperparathyroidism of renal origin: Secondary | ICD-10-CM | POA: Diagnosis not present

## 2016-07-25 DIAGNOSIS — N186 End stage renal disease: Secondary | ICD-10-CM | POA: Diagnosis not present

## 2016-07-28 DIAGNOSIS — N2581 Secondary hyperparathyroidism of renal origin: Secondary | ICD-10-CM | POA: Diagnosis not present

## 2016-07-28 DIAGNOSIS — Z992 Dependence on renal dialysis: Secondary | ICD-10-CM | POA: Diagnosis not present

## 2016-07-28 DIAGNOSIS — N186 End stage renal disease: Secondary | ICD-10-CM | POA: Diagnosis not present

## 2016-07-28 DIAGNOSIS — D509 Iron deficiency anemia, unspecified: Secondary | ICD-10-CM | POA: Diagnosis not present

## 2016-07-28 DIAGNOSIS — D631 Anemia in chronic kidney disease: Secondary | ICD-10-CM | POA: Diagnosis not present

## 2016-07-29 ENCOUNTER — Ambulatory Visit (INDEPENDENT_AMBULATORY_CARE_PROVIDER_SITE_OTHER): Payer: Medicare Other | Admitting: *Deleted

## 2016-07-29 DIAGNOSIS — J8489 Other specified interstitial pulmonary diseases: Secondary | ICD-10-CM

## 2016-07-29 NOTE — Progress Notes (Signed)
SMW performed today. 

## 2016-07-30 DIAGNOSIS — N2581 Secondary hyperparathyroidism of renal origin: Secondary | ICD-10-CM | POA: Diagnosis not present

## 2016-07-30 DIAGNOSIS — Z992 Dependence on renal dialysis: Secondary | ICD-10-CM | POA: Diagnosis not present

## 2016-07-30 DIAGNOSIS — D509 Iron deficiency anemia, unspecified: Secondary | ICD-10-CM | POA: Diagnosis not present

## 2016-07-30 DIAGNOSIS — D631 Anemia in chronic kidney disease: Secondary | ICD-10-CM | POA: Diagnosis not present

## 2016-07-30 DIAGNOSIS — N186 End stage renal disease: Secondary | ICD-10-CM | POA: Diagnosis not present

## 2016-07-31 ENCOUNTER — Ambulatory Visit (INDEPENDENT_AMBULATORY_CARE_PROVIDER_SITE_OTHER): Payer: Medicare Other | Admitting: Endocrinology

## 2016-07-31 ENCOUNTER — Encounter: Payer: Self-pay | Admitting: Endocrinology

## 2016-07-31 ENCOUNTER — Ambulatory Visit: Payer: BC Managed Care – PPO | Admitting: Family

## 2016-07-31 VITALS — BP 136/62 | HR 72 | Temp 98.0°F | Ht 73.0 in | Wt 321.4 lb

## 2016-07-31 DIAGNOSIS — E1065 Type 1 diabetes mellitus with hyperglycemia: Secondary | ICD-10-CM | POA: Diagnosis not present

## 2016-07-31 DIAGNOSIS — M79642 Pain in left hand: Secondary | ICD-10-CM

## 2016-07-31 DIAGNOSIS — E108 Type 1 diabetes mellitus with unspecified complications: Secondary | ICD-10-CM

## 2016-07-31 DIAGNOSIS — M79641 Pain in right hand: Secondary | ICD-10-CM

## 2016-07-31 LAB — LIPID PANEL
Cholesterol: 112 mg/dL (ref 0–200)
HDL: 50.4 mg/dL (ref >39.00)
LDL Cholesterol: 29 mg/dL (ref 0–99)
NONHDL: 61.58
Total CHOL/HDL Ratio: 2
Triglycerides: 164 mg/dL — ABNORMAL HIGH (ref 0.0–149.0)
VLDL: 32.8 mg/dL (ref 0.0–40.0)

## 2016-07-31 LAB — GLUCOSE, RANDOM: GLUCOSE: 87 mg/dL (ref 70–99)

## 2016-07-31 LAB — POCT GLYCOSYLATED HEMOGLOBIN (HGB A1C): Hemoglobin A1C: 6.4

## 2016-07-31 NOTE — Patient Instructions (Addendum)
  Novolog 42 at supper and and 46 for larger meals or more carbs, take right after the meal  Tresiba 38 units   More sugars 2 hrs after meals  Low fat diet

## 2016-07-31 NOTE — Progress Notes (Signed)
Patient ID: Ryan Arnold, male   DOB: 10/23/1960, 55 y.o.   MRN: [id number]    Chief complaint : Follow up of Type 1 Diabetes  History of Present Illness:          Date of diagnosis: Age 55      Prior history:     He has been on various insulin regimens  since diagnosis.   Was on insulin pump 2 years ago, and then taken off pump during hospitalization and never put back on it.  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, and found it slightly more convenient at that time  Recent history:    INSULIN regimen: Tresiba 40 qd., Novolog 38 units before meals  He has been on prednisone since 6/16 for his pulmonary condition and blood sugars have been consistently high  Currently is taking 5 mg 3 days a week   His A1c is 7.9 in July, previously 8.4 but now it is down to 6.4  Current management, blood sugar patterns, problems identified:  He was supposed to be taking at least 42 units of Novolog at suppertime but he is still taking only 38  He takes 35 units for smaller meals at breakfast and lunch even if it is a half sandwich  Currently taking 40 units of Tresiba once a day    FASTING readings on his dialysis days recently are significantly lower including a couple of readings that are mildly low  Blood sugars are sporadically very high late evening and difficult to know which readings are before and which are after eating  POSTPRANDIAL readings in the evening are being checked sometimes 30-60 minutes after eating  He is not marking his readings as postprandial at any time  NOVOLOG: He does try to take it more regularly  However in the evening he will take it right after eating when he gets back to his car  He is not very motivated to watch his diet and will be eating out practically every evening and sometimes eating fried food or biscuits  No hypoglycemia during the day  He refuses to see the dietitian  Dialysis days: Monday/Wednesday/Friday.  He has difficulty  checking his blood sugars now because of his fingers being stiff and sore  Glucose monitoring:  done about 2 times a day         Glucometer: One Touch.      Blood Glucose reading analysis from monitor download as follows:  Mean values apply above for all meters except median for One Touch  PRE-MEAL Overnight  Late a.m.  5-7 PM  7-9 PM  Overall  Glucose range: 54-427  70-364  215, 228  159-537    Mean/median: 117 220   383  183   Symptoms of hypoglycemia: Weakness and shakiness  Self-care:   Meal times:  breakfast 5 am on dialysis days otherwise his first meal is at 10 AM-12 noon, dinner 6-7 pm, variable  Exercise: minimal.         Most recent dietitian/nurse educator visit : 7/17 with CDE   Wt Readings from Last 3 Encounters:  07/31/16 (!) 321 lb 6.4 oz (145.8 kg)  06/10/16 (!) 321 lb 9.6 oz (145.9 kg)  06/03/16 (!) 325 lb (147.4 kg)   Diabetes labs:  Lab Results  Component Value Date   HGBA1C 6.4 07/31/2016   HGBA1C 7.9 04/29/2016   HGBA1C 8.4 01/17/2016   Lab Results  Component Value Date   LDLCALC 65 06/14/2015  CREATININE 6.45 (H) 03/31/2016        Medication List       Accurate as of 07/31/16  9:45 AM. Always use your most recent med list.          aspirin 81 MG chewable tablet Chew 1 tablet (81 mg total) by mouth daily.   atorvastatin 40 MG tablet Commonly known as:  LIPITOR Take 1 tablet (40 mg total) by mouth daily.   B-D ULTRAFINE III SHORT PEN 31G X 8 MM Misc Generic drug:  Insulin Pen Needle use as directed   DULoxetine 60 MG capsule Commonly known as:  CYMBALTA Take 2 capsules (120 mg total) by mouth daily.   furosemide 80 MG tablet Commonly known as:  LASIX take 1 tablet by mouth 4 TIMES  A WEEK ,TUESDAY ,THURSDAY ,SATURDAY AND SUNDAY   gabapentin 300 MG capsule Commonly known as:  NEURONTIN Take 1 capsule (300 mg total) by mouth 3 (three) times daily.   metoprolol 50 MG tablet Commonly known as:  LOPRESSOR Take 50 mg by mouth 2  (two) times daily.   multivitamin Tabs tablet Take 1 tablet by mouth daily.   NOVOLOG FLEXPEN 100 UNIT/ML FlexPen Generic drug:  insulin aspart inject 35 units subcutaneously three times a day with food   ONE TOUCH ULTRA TEST test strip Generic drug:  glucose blood TEST BLOOD SUGAR four times a day   pantoprazole 40 MG tablet Commonly known as:  PROTONIX take 1 tablet by mouth once daily   predniSONE 5 MG tablet Commonly known as:  DELTASONE Take 1 tab (10mg ) Every Tuesday, Thursday & Saturday   SENSIPAR 30 MG tablet Generic drug:  cinacalcet Take 30 mg by mouth at bedtime. Reported on 04/29/2016   sevelamer carbonate 800 MG tablet Commonly known as:  RENVELA Take 800 mg by mouth 3 (three) times daily. And 2 tablet with snacks   TRESIBA FLEXTOUCH 100 UNIT/ML Sopn FlexTouch Pen Generic drug:  insulin degludec inject 40 units subcutaneously once daily   valsartan 80 MG tablet Commonly known as:  DIOVAN Take 160 mg by mouth at bedtime.       Allergies:  Allergies  Allergen Reactions  . Oxycodone-Acetaminophen Nausea And Vomiting  . Tape Other (See Comments)    Paper tape caused blisters    Past Medical History:  Diagnosis Date  . Anemia of chronic disease   . BOOP (bronchiolitis obliterans with organizing pneumonia) (HCC)    a. 01/2018 s/p R thoracoscopy/Bx confirming BOOP;  b. 02/2018 high dose steroids started.  . Chest pain    a. 06/2014 Myoview ([facility]) no ischemia/infarct, nl EF.  [name] Chronic diastolic CHF (congestive heart failure) (HCC)    a. 12/2014 Echo: EF 50-55%, mild conc LVH, mildly dil LA.  . End stage renal disease (HCC)    a. on dialysis; still makes urine.  . Essential hypertension   . Hyperlipemia   . Morbid obesity (HCC)   . OSA treated with BiPAP   . Recurrent pneumonia    a. 2/2 BOOP.  [name] Secondary hyperparathyroidism (HCC)   . Thyroid disease   . Type 1 diabetes mellitus (HCC)     Past Surgical History:  Procedure Laterality Date  .  cataract Bilateral   . GALLBLADDER SURGERY    . KNEE SURGERY Right   . LUNG BIOPSY    . SHOULDER SURGERY Left     Family History  Problem Relation Age of Onset  . Diabetes Mother     died @  62.  . Hypertension Mother   . Hypertension Maternal Grandmother     Social History:  reports that he has never smoked. He has never used smokeless tobacco. He reports that he does not drink alcohol or use drugs.    Review of Systems:   For the last month or so he has been complaining about soreness, stiffness and difficulty opening 1 or 2 fingers in the morning unless he is taping them  CKD: He is on dialysis, goes on Mondays, Wednesdays and Fridays  He is on steroids for bronchiolitis obliterans and Is on minimal dose is currently without recurrence  Lipids: Needs follow-up He thinks he is compliant with his Lipitor  Lab Results  Component Value Date   CHOL 194 06/14/2015   HDL 99.30 06/14/2015   LDLCALC 65 06/14/2015   TRIG 151.0 (H) 06/14/2015   CHOLHDL 2 06/14/2015    Diabetes complications: Nephropathy, Neuropathy  He is getting sharp pains in his legs, has been given gabapentin but only 100 mg by another physician  Last Eye Exam was in 6/17   Physical Examination:  BP 136/62   Pulse 72   Temp 98 F (36.7 C)   Ht 6\' 1"  (1.854 m)   Wt (!) 321 lb 6.4 oz (145.8 kg)   SpO2 95%   BMI 42.40 kg/m          ASSESSMENT: See history of present illness for detailed discussion of his current management, blood sugar patterns and problems identified  Diabetes type 1 with Generally poor control and multiple complications  He is taking inadequate amounts of mealtime insulin at least in the evenings as judged by his significantly high readings at times after eating However he does his blood sugar 30-60 minutes after eating instead of 2 hours Also his poorly motivated to watch his diet with eating out at restaurants every evening and eating high-fat meals regularly His diet is  still poor with eating out and not eating low fat meals usually Recently fasting readings are low normal but mostly when he is checking them early before dialysis otherwise we may be occasionally over 200 He does not adjust his mealtime dose based on what he is eating and carbohydrates No exercise and has not lost weight  HYPERLIPIDEMIA: Needs follow-up  Thickening of fingers, difficulty with movement and soreness: This may be related to his diabetes but recommend rheumatology evaluation, symptoms may be more prominent with his getting off prednisone  PLAN:   He will need to reduce high-fat foods at restaurants  Continue Evaristo Bury but reduce the dose to 38 at least  Cover breakfast and lunch meals  consistently  Take at least 42 units of Novolog at suppertime, up to 48 if higher fat.   Needs to check sugars more often before and about 2 hours after meals or bedtime  He can cut back on his monitoring in the morning since fasting readings are fairly stable  Discussed blood sugar targets  Have recommended consultation with dietitian but he does not want to do this  Patient Instructions   Novolog 42 at supper and and 46 for larger meals or more carbs, take right after the meal  Tresiba 38 units   More sugars 2 hrs after meals  Low fat diet     Counseling time on subjects discussed above is over 50% of today's 25 minute visit     Josha Weekley 07/31/2016, 9:45 AM

## 2016-08-01 DIAGNOSIS — N2581 Secondary hyperparathyroidism of renal origin: Secondary | ICD-10-CM | POA: Diagnosis not present

## 2016-08-01 DIAGNOSIS — Z992 Dependence on renal dialysis: Secondary | ICD-10-CM | POA: Diagnosis not present

## 2016-08-01 DIAGNOSIS — D631 Anemia in chronic kidney disease: Secondary | ICD-10-CM | POA: Diagnosis not present

## 2016-08-01 DIAGNOSIS — D509 Iron deficiency anemia, unspecified: Secondary | ICD-10-CM | POA: Diagnosis not present

## 2016-08-01 DIAGNOSIS — N186 End stage renal disease: Secondary | ICD-10-CM | POA: Diagnosis not present

## 2016-08-04 DIAGNOSIS — Z992 Dependence on renal dialysis: Secondary | ICD-10-CM | POA: Diagnosis not present

## 2016-08-04 DIAGNOSIS — N2581 Secondary hyperparathyroidism of renal origin: Secondary | ICD-10-CM | POA: Diagnosis not present

## 2016-08-04 DIAGNOSIS — D509 Iron deficiency anemia, unspecified: Secondary | ICD-10-CM | POA: Diagnosis not present

## 2016-08-04 DIAGNOSIS — N186 End stage renal disease: Secondary | ICD-10-CM | POA: Diagnosis not present

## 2016-08-04 DIAGNOSIS — D631 Anemia in chronic kidney disease: Secondary | ICD-10-CM | POA: Diagnosis not present

## 2016-08-05 ENCOUNTER — Encounter: Payer: Self-pay | Admitting: Internal Medicine

## 2016-08-05 ENCOUNTER — Ambulatory Visit (INDEPENDENT_AMBULATORY_CARE_PROVIDER_SITE_OTHER): Payer: Medicare Other | Admitting: Internal Medicine

## 2016-08-05 VITALS — BP 138/78 | HR 76 | Wt 322.0 lb

## 2016-08-05 DIAGNOSIS — K529 Noninfective gastroenteritis and colitis, unspecified: Secondary | ICD-10-CM

## 2016-08-05 DIAGNOSIS — J8489 Other specified interstitial pulmonary diseases: Secondary | ICD-10-CM

## 2016-08-05 NOTE — Assessment & Plan Note (Signed)
Episode of fatigue followed by watery diarrhea for 2 days over the weekend. Most likely viral etiology. States that he frequents restaurants, but did not go to any new restaurants this past weekend. Since Sunday night not having any further episodes of diarrhea, stool is now back to normal per patient.  Plan -Supportive care

## 2016-08-05 NOTE — Progress Notes (Signed)
MRN# [id number] Ryan Arnold 06-29-1961   CC: Chief Complaint  Patient presents with  . Follow-up    SMW & Office Spiro: SOB was worse even of SMW; SOB feels little more; took extra Prednisone 5mg  day after walk   Synopsis: 55 year old male past medical history of end-stage renal disease on dialysis, uncontrolled diabetes, hypertension, former retired Emergency planning/management officer, seen in consultation for recurrent pneumonias. Multiple hospitalizations at [facility] and at [facility] over the past one year for bilateral groundglass opacities throughout entire lung fields requiring multiple rounds of antibiotics and steroids. Had a bronchoscopy done at [facility] and within the last 2 years no acute findings from the BAL. Patient with recent bronchoscopy, March 2016, at [facility] by Dr. Dema Severin, BAL with no acute findings. April 2016 surgical lung bx (wedge) by Dr. Thelma Barge, path confirms BOOP, started on high dose steroids.   Events since last clinic visit: Patient presents today for a BOOP follow up. Patient states today that since his last visit, over this past weekend he had 2 days of watery nonbloody diarrhea, 3-4 episodes per day of spontaneous resolve, however since having this diarrhea episode he had increased shortness of breath and fatigue. On Sunday he took an extra 5 mg of prednisone and felt significantly improved. However he is not back to his baseline and still endorses fatigue and increased shortness of breath. He had a 6 minute walk test and an office spirometry done last week, which we covered the results today.    Medication:    Current Outpatient Prescriptions:  .  aspirin 81 MG chewable tablet, Chew 1 tablet (81 mg total) by mouth daily., Disp: 30 tablet, Rfl: 2 .  atorvastatin (LIPITOR) 40 MG tablet, Take 1 tablet (40 mg total) by mouth daily. (Patient taking differently: Take 20 mg by mouth daily. ), Disp: 90 tablet, Rfl: 3 .  B-D ULTRAFINE III SHORT PEN 31G X 8 MM MISC, use as directed, Disp: 100 each, Rfl:  11 .  DULoxetine (CYMBALTA) 60 MG capsule, Take 2 capsules (120 mg total) by mouth daily., Disp: 90 capsule, Rfl: 2 .  furosemide (LASIX) 80 MG tablet, take 1 tablet by mouth 4 TIMES  A WEEK ,TUESDAY ,THURSDAY ,SATURDAY AND SUNDAY, Disp: 36 tablet, Rfl: 3 .  gabapentin (NEURONTIN) 300 MG capsule, Take 1 capsule (300 mg total) by mouth 3 (three) times daily., Disp: 90 capsule, Rfl: 3 .  metoprolol (LOPRESSOR) 50 MG tablet, Take 50 mg by mouth 2 (two) times daily., Disp: , Rfl:  .  multivitamin (RENA-VIT) TABS tablet, Take 1 tablet by mouth daily., Disp: , Rfl:  .  NOVOLOG FLEXPEN 100 UNIT/ML FlexPen, inject 35 units subcutaneously three times a day with food, Disp: 30 mL, Rfl: 1 .  ONE TOUCH ULTRA TEST test strip, TEST BLOOD SUGAR four times a day, Disp: 100 each, Rfl: 3 .  pantoprazole (PROTONIX) 40 MG tablet, take 1 tablet by mouth once daily, Disp: 90 tablet, Rfl: 3 .  predniSONE (DELTASONE) 5 MG tablet, Take 1 tab (10mg ) Every Tuesday, Thursday & Saturday, Disp: 40 tablet, Rfl: 1 .  SENSIPAR 30 MG tablet, Take 30 mg by mouth at bedtime. Reported on 04/29/2016, Disp: , Rfl:  .  sevelamer carbonate (RENVELA) 800 MG tablet, Take 800 mg by mouth 3 (three) times daily. And 2 tablet with snacks, Disp: , Rfl:  .  TRESIBA FLEXTOUCH 100 UNIT/ML SOPN, inject 40 units subcutaneously once daily (Patient taking differently: Inject 46 units AM & 6 units PM), Disp: 15  mL, Rfl: 2 .  valsartan (DIOVAN) 80 MG tablet, Take 160 mg by mouth at bedtime. , Disp: , Rfl: 0     Review of Systems: Gen:  Denies  fever, sweats, chills HEENT: Denies blurred vision, double vision, ear pain, eye pain, hearing loss, nose bleeds, sore throat Cvc:  No dizziness, chest pain or heaviness Resp:   Admits NF:[id number]:chronic dyspnea (worst today) Gi: diarrhea (watery, nonbloody x 2 days) now resolved Gu:  Denies bladder incontinence, burning urine Ext:   No Joint pain, stiffness or swelling Skin: No skin rash, easy bruising or bleeding  or hives Endoc:  No polyuria, polydipsia , polyphagia or weight change Other:  All other systems negative  Allergies:  Oxycodone-acetaminophen and Tape  Physical Examination:  VS: BP 138/78 (BP Location: Right Arm, Cuff Size: Normal)   Pulse 76   Wt (!) 322 lb (146.1 kg)   SpO2 98%   BMI 42.48 kg/m   General Appearance: No distress  HEENT: PERRLA, no ptosis, no other lesions noticed Pulmonary: Good respiratory effort, mild decreased breath sounds at the bases(this is baseline), no wheezes, no crackles Cardiovascular:  Normal S1,S2.  No m/r/g.     Abdomen:Exam: Benign, Soft, non-tender, No masses  Skin:   warm, no ecchymosis, fine milia appearance lesions on the face and forehead (no erythema, no eruption),with significant improvement Extremities: normal, no cyanosis, clubbing, warm with normal capillary refill.      Rad results: (The following images and results were reviewed by Dr. Dema SeverinMungal on 08/05/2016). CXR 01/22/16 EXAM: CHEST 2 VIEW  COMPARISON: 12/09/2015  FINDINGS: Mild cardiomegaly. Areas of scarring in the right lung are stable. Left lung is clear. No effusions. No acute bony abnormality.  IMPRESSION: Cardiomegaly. Chronic changes. No active disease..    Assessment and Plan:54 yo male seen in follow up for BOOP. Patient doing well on current dose. No exacerbations of SOB, no signs of infection at this time. 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 Today we will increase prednisone to 7.5 mg on Tu, Th, and Saturday for 2 weeks, then back to 5mg   until follow up in 4 weeks. He had some symptoms after a 50% reduction at last visit, will go a little slower this time around.  Plan for Prednisone 2.5mg  at follow up visit, hopefully he will tolerate this small dose.  Only requiring O2 at night and with intense exertion.  Spirometry 07/29/16  - FEV1 2.02 (55%), FEV1/FVC 80% 6MWT - lowest sat 89%, 237/778 ft, highest HR 82 bpm  Plan: - prednisone to 7.55 mg on Tu, Th, and Saturday for 2 weeks, then back to 5 mg until follow up in 4 weeks. . Patient is advised if symptoms return, and is persistent for 3-4 days, then increase prednisone to 7.5mg  on Tu, Th, Sat.  - If there is another relapse of BOOP, will have to consider another bronchoscopy to rule out atypical infections exacerbating BOOP. - continue with 2-3L O2 with exertion and with sleep, follow-up with sleep doctor for further recommendations                       Gastroenteritis Episode of fatigue followed by watery diarrhea for 2 days over the weekend. Most likely viral etiology. States that he frequents restaurants, but did not go to any new restaurants this past weekend. Since Sunday night not having any further episodes of  diarrhea, stool is now back to normal per patient.  Plan -Supportive care

## 2016-08-05 NOTE — Patient Instructions (Signed)
Follow up with Dr. Dema SeverinMungal in:1 month - start 7.5mg  Prednisone on Tu, Th, Sat for 2 weeks, then go back to 5mg  on Tu, Th, Sat until follow up  - cont with CPAP and O2 at night - cont with diet and exercise as tolerated.

## 2016-08-05 NOTE — Assessment & Plan Note (Addendum)
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 Today we will increase prednisone to 7.5 mg on Tu, Th, and Saturday for 2 weeks, then back to 5mg   until follow up in 4 weeks. He had some symptoms after a 50% reduction at last visit, will go a little slower this time around.  Plan for Prednisone 2.5mg  at follow up visit, hopefully he will tolerate this small dose.  Only requiring O2 at night and with intense exertion.  Spirometry 07/29/16 - FEV1 2.02 (55%), FEV1/FVC 80% 6MWT - lowest sat 89%, 237/778 ft, highest HR 82 bpm  Plan: - prednisone to 7.55 mg on Tu, Th, and Saturday for 2 weeks, then back to 5 mg until follow up in 4 weeks. . Patient is advised if symptoms return, and is persistent for 3-4 days, then increase prednisone to 7.5mg  on Tu, Th, Sat.  - If there is another relapse of BOOP, will have to consider another bronchoscopy to rule out atypical infections exacerbating BOOP. - continue with 2-3L O2 with exertion and with sleep, follow-up with sleep doctor for further recommendations

## 2016-08-06 DIAGNOSIS — D509 Iron deficiency anemia, unspecified: Secondary | ICD-10-CM | POA: Diagnosis not present

## 2016-08-06 DIAGNOSIS — N2581 Secondary hyperparathyroidism of renal origin: Secondary | ICD-10-CM | POA: Diagnosis not present

## 2016-08-06 DIAGNOSIS — D631 Anemia in chronic kidney disease: Secondary | ICD-10-CM | POA: Diagnosis not present

## 2016-08-06 DIAGNOSIS — N186 End stage renal disease: Secondary | ICD-10-CM | POA: Diagnosis not present

## 2016-08-06 DIAGNOSIS — Z992 Dependence on renal dialysis: Secondary | ICD-10-CM | POA: Diagnosis not present

## 2016-08-07 ENCOUNTER — Ambulatory Visit: Payer: Medicare Other | Attending: Family | Admitting: Family

## 2016-08-07 ENCOUNTER — Encounter: Payer: Self-pay | Admitting: Family

## 2016-08-07 VITALS — BP 149/66 | HR 82 | Resp 18 | Ht 73.0 in | Wt 321.0 lb

## 2016-08-07 DIAGNOSIS — D649 Anemia, unspecified: Secondary | ICD-10-CM | POA: Diagnosis not present

## 2016-08-07 DIAGNOSIS — N186 End stage renal disease: Secondary | ICD-10-CM | POA: Diagnosis not present

## 2016-08-07 DIAGNOSIS — E1122 Type 2 diabetes mellitus with diabetic chronic kidney disease: Secondary | ICD-10-CM | POA: Insufficient documentation

## 2016-08-07 DIAGNOSIS — I132 Hypertensive heart and chronic kidney disease with heart failure and with stage 5 chronic kidney disease, or end stage renal disease: Secondary | ICD-10-CM | POA: Diagnosis not present

## 2016-08-07 DIAGNOSIS — Z79899 Other long term (current) drug therapy: Secondary | ICD-10-CM | POA: Insufficient documentation

## 2016-08-07 DIAGNOSIS — I5032 Chronic diastolic (congestive) heart failure: Secondary | ICD-10-CM | POA: Diagnosis not present

## 2016-08-07 DIAGNOSIS — E108 Type 1 diabetes mellitus with unspecified complications: Secondary | ICD-10-CM

## 2016-08-07 DIAGNOSIS — G4733 Obstructive sleep apnea (adult) (pediatric): Secondary | ICD-10-CM | POA: Insufficient documentation

## 2016-08-07 DIAGNOSIS — Z7982 Long term (current) use of aspirin: Secondary | ICD-10-CM | POA: Diagnosis not present

## 2016-08-07 DIAGNOSIS — Z992 Dependence on renal dialysis: Secondary | ICD-10-CM | POA: Insufficient documentation

## 2016-08-07 DIAGNOSIS — E785 Hyperlipidemia, unspecified: Secondary | ICD-10-CM | POA: Insufficient documentation

## 2016-08-07 DIAGNOSIS — I10 Essential (primary) hypertension: Secondary | ICD-10-CM

## 2016-08-07 NOTE — Progress Notes (Signed)
Patient ID: TENNESSEE PERRA, male    DOB: June 14, 1961, 55 y.o.   MRN: [id number]  HPI  Ryan Arnold is a 55 y/o male with a history of DM, BOOP, obstructive sleep apnea, hyperlipidemia, HTN, ESRD on dialysis, anemia, chest pain and chronic heart failure.  Last echo was done 12/13/14 which showed an EF of 50-55% with trace Ryan and trivial TR. Last bronchoscopy done March 2016. Had office spirometry and 6 minute walk test done last week and saw pulmonology yesterday.   Was in the ED on 03/31/16 due to hypotension and near syncope after dialysis. Wasn't feeling well a few days prior to that. Labs were negative, patient felt better and was sent home that day.  Last admission was June 2016 and was due to a HF exacerbation resulting in pulmonary edema.   He presents today for a follow-up visit with fatigue and shortness of breath upon exertion. Chronic pitting edema in lower extremities. Continues to weigh daily and reports a stable weight. Has dialysis on M, W, F. Getting an iron infusion weekly at dialysis. Having some problems with pain in his hands and a burning sensation in his left ring finger.   Past Medical History:  Diagnosis Date  . Anemia of chronic disease   . BOOP (bronchiolitis obliterans with organizing pneumonia) (HCC)    a. 01/2018 s/p R thoracoscopy/Bx confirming BOOP;  b. 02/2018 high dose steroids started.  . Chest pain    a. 06/2014 Myoview ([facility]) no ischemia/infarct, nl EF.  [name] Chronic diastolic CHF (congestive heart failure) (HCC)    a. 12/2014 Echo: EF 50-55%, mild conc LVH, mildly dil LA.  . End stage renal disease (HCC)    a. on dialysis; still makes urine.  . Essential hypertension   . Hyperlipemia   . Morbid obesity (HCC)   . OSA treated with BiPAP   . Recurrent pneumonia    a. 2/2 BOOP.  [name] Secondary hyperparathyroidism (HCC)   . Thyroid disease   . Type 1 diabetes mellitus (HCC)     Past Surgical History:  Procedure Laterality Date  . cataract Bilateral   .  GALLBLADDER SURGERY    . KNEE SURGERY Right   . LUNG BIOPSY    . SHOULDER SURGERY Left     Family History  Problem Relation Age of Onset  . Diabetes Mother     died @ 79.  [name] Hypertension Mother   . Hypertension Maternal Grandmother     Social History  Substance Use Topics  . Smoking status: Never Smoker  . Smokeless tobacco: Never Used  . Alcohol use No    Allergies  Allergen Reactions  . Oxycodone-Acetaminophen Nausea And Vomiting  . Tape Other (See Comments)    Paper tape caused blisters    Prior to Admission medications   Medication Sig Start Date End Date Taking? Authorizing Provider  aspirin 81 MG chewable tablet Chew 1 tablet (81 mg total) by mouth daily. 03/21/15  Yes Enid Baas, MD  atorvastatin (LIPITOR) 40 MG tablet Take 1 tablet (40 mg total) by mouth daily. Patient taking differently: Take 20 mg by mouth daily.  03/11/16  Yes Tommie Sams, DO  B-D ULTRAFINE III SHORT PEN 31G X 8 MM MISC use as directed 06/07/15  Yes Tommie Sams, DO  DULoxetine (CYMBALTA) 60 MG capsule Take 2 capsules (120 mg total) by mouth daily. 06/03/16  Yes Tommie Sams, DO  furosemide (LASIX) 80 MG tablet take 1 tablet by mouth 4  TIMES  A WEEK ,TUESDAY ,THURSDAY ,SATURDAY AND SUNDAY 12/27/15  Yes Delma Freeze, FNP  gabapentin (NEURONTIN) 300 MG capsule Take 1 capsule (300 mg total) by mouth 3 (three) times daily. 04/29/16  Yes Reather Littler, MD  metoprolol (LOPRESSOR) 50 MG tablet Take 50 mg by mouth 2 (two) times daily. 12/23/14  Yes Historical Provider, MD  multivitamin (RENA-VIT) TABS tablet Take 1 tablet by mouth daily. 01/09/14  Yes Historical Provider, MD  NOVOLOG FLEXPEN 100 UNIT/ML FlexPen inject 35 units subcutaneously three times a day with food 07/21/16  Yes Reather Littler, MD  ONE TOUCH ULTRA TEST test strip TEST BLOOD SUGAR four times a day 04/14/16  Yes Tommie Sams, DO  pantoprazole (PROTONIX) 40 MG tablet take 1 tablet by mouth once daily 03/20/16  Yes Tommie Sams, DO  predniSONE  (DELTASONE) 5 MG tablet Take 1 tab (10mg ) Every Tuesday, Thursday & Saturday Patient taking differently: Take 7.5 mg by mouth. Take 1 tab (10mg ) Every Tuesday, Thursday & Saturday 05/08/16  Yes [name] Mungal, MD  SENSIPAR 30 MG tablet Take 30 mg by mouth at bedtime. Reported on 04/29/2016 12/18/14  Yes Historical Provider, MD  sevelamer carbonate (RENVELA) 800 MG tablet Take 800 mg by mouth 3 (three) times daily. And 2 tablet with snacks   Yes Historical Provider, MD  TRESIBA FLEXTOUCH 100 UNIT/ML SOPN inject 40 units subcutaneously once daily Patient taking differently: Inject 46 units AM & 6 units PM 03/27/16  Yes Reather Littler, MD  valsartan (DIOVAN) 80 MG tablet Take 160 mg by mouth at bedtime.  05/29/15  Yes Historical Provider, MD     Review of Systems  Constitutional: Positive for fatigue. Negative for appetite change.  HENT: Negative for congestion, postnasal drip and sore throat.   Eyes: Negative.   Respiratory: Positive for shortness of breath. Negative for cough.   Cardiovascular: Positive for chest pain (right sided during dialysis one time last week) and leg swelling. Negative for palpitations.  Gastrointestinal: Negative for abdominal distention and abdominal pain.  Endocrine: Negative.   Genitourinary: Negative.   Musculoskeletal: Positive for arthralgias (both hands). Negative for back pain.  Skin: Negative.   Allergic/Immunologic: Negative.   Neurological: Negative for dizziness and light-headedness.       Burning sensation in left ring finger   Hematological: Negative for adenopathy. Does not bruise/bleed easily.  Psychiatric/Behavioral: Negative for dysphoric mood and sleep disturbance (wearing bipap and oxygen). The patient is not nervous/anxious.    Vitals:   08/07/16 1040  BP: (!) 149/66  Pulse: 82  Resp: 18  SpO2: 100%  Weight: (!) 321 lb (145.6 kg)  Height: 6\' 1"  (1.854 m)      Physical Exam  Constitutional: He is oriented to person, place, and time. He appears  well-developed and well-nourished.  HENT:  Head: Normocephalic and atraumatic.  Eyes: Conjunctivae are normal. Pupils are equal, round, and reactive to light.  Neck: Normal range of motion. Neck supple.  Cardiovascular: Normal rate and regular rhythm.   Pulmonary/Chest: Effort normal. He has no wheezes. He has no rales.  Abdominal: Soft. He exhibits no distension. There is no tenderness.  Musculoskeletal: He exhibits edema (1+ pitting edema in bilateral lower legs). He exhibits no tenderness.  Neurological: He is alert and oriented to person, place, and time.  Skin: Skin is warm and dry.  Psychiatric: He has a normal mood and affect. His behavior is normal. Thought content normal.  Nursing note and vitals reviewed.  Assessment & Plan:  1: Chronic heart failure with preserved ejection fraction- - NYHA class II - mildly fluid overloaded today but chronic in nature - elevate legs and wear compression socks - taking furosemide on non-dialysis days - received his flu vaccine for this season  2: HTN- - BP looks good today - continue medications at this time  3: Obstructive sleep apnea- - wearing bipap nightly - wearing oxygen at night at 2L as well  4: CKD with dialysis- - dialysis on M, W, F - wears oxygen during dialysis  5: Diabetes- - glucose this morning was 117 - A1c reported to be 6.4% - still eats out often - sees endocrinologist on 10/30/16  Return here in 6 months or sooner for any questions/problems before then.

## 2016-08-07 NOTE — Patient Instructions (Signed)
Continue weighing daily and call for an overnight weight gain of > 2 pounds or a weekly weight gain of >5 pounds. 

## 2016-08-08 DIAGNOSIS — D631 Anemia in chronic kidney disease: Secondary | ICD-10-CM | POA: Diagnosis not present

## 2016-08-08 DIAGNOSIS — N2581 Secondary hyperparathyroidism of renal origin: Secondary | ICD-10-CM | POA: Diagnosis not present

## 2016-08-08 DIAGNOSIS — D509 Iron deficiency anemia, unspecified: Secondary | ICD-10-CM | POA: Diagnosis not present

## 2016-08-08 DIAGNOSIS — N186 End stage renal disease: Secondary | ICD-10-CM | POA: Diagnosis not present

## 2016-08-08 DIAGNOSIS — Z992 Dependence on renal dialysis: Secondary | ICD-10-CM | POA: Diagnosis not present

## 2016-08-11 DIAGNOSIS — D509 Iron deficiency anemia, unspecified: Secondary | ICD-10-CM | POA: Diagnosis not present

## 2016-08-11 DIAGNOSIS — Z992 Dependence on renal dialysis: Secondary | ICD-10-CM | POA: Diagnosis not present

## 2016-08-11 DIAGNOSIS — D631 Anemia in chronic kidney disease: Secondary | ICD-10-CM | POA: Diagnosis not present

## 2016-08-11 DIAGNOSIS — N186 End stage renal disease: Secondary | ICD-10-CM | POA: Diagnosis not present

## 2016-08-11 DIAGNOSIS — N2581 Secondary hyperparathyroidism of renal origin: Secondary | ICD-10-CM | POA: Diagnosis not present

## 2016-08-12 ENCOUNTER — Encounter: Payer: Self-pay | Admitting: Emergency Medicine

## 2016-08-12 ENCOUNTER — Ambulatory Visit (INDEPENDENT_AMBULATORY_CARE_PROVIDER_SITE_OTHER): Payer: Medicare Other | Admitting: Family

## 2016-08-12 ENCOUNTER — Encounter: Payer: Self-pay | Admitting: Family

## 2016-08-12 ENCOUNTER — Emergency Department
Admission: EM | Admit: 2016-08-12 | Discharge: 2016-08-12 | Disposition: A | Discharge status: Home / Self Care | Payer: Medicare Other | Attending: Emergency Medicine | Admitting: Emergency Medicine

## 2016-08-12 ENCOUNTER — Telehealth: Payer: Self-pay

## 2016-08-12 ENCOUNTER — Telehealth: Payer: Self-pay | Admitting: Family

## 2016-08-12 VITALS — BP 148/80 | HR 81 | Temp 98.4°F | Ht 73.0 in | Wt 325.8 lb

## 2016-08-12 DIAGNOSIS — I10 Essential (primary) hypertension: Secondary | ICD-10-CM

## 2016-08-12 DIAGNOSIS — R300 Dysuria: Secondary | ICD-10-CM | POA: Diagnosis not present

## 2016-08-12 DIAGNOSIS — Z1211 Encounter for screening for malignant neoplasm of colon: Secondary | ICD-10-CM | POA: Diagnosis not present

## 2016-08-12 DIAGNOSIS — E1022 Type 1 diabetes mellitus with diabetic chronic kidney disease: Secondary | ICD-10-CM | POA: Diagnosis not present

## 2016-08-12 DIAGNOSIS — I132 Hypertensive heart and chronic kidney disease with heart failure and with stage 5 chronic kidney disease, or end stage renal disease: Secondary | ICD-10-CM | POA: Insufficient documentation

## 2016-08-12 DIAGNOSIS — M6530 Trigger finger, unspecified finger: Secondary | ICD-10-CM

## 2016-08-12 DIAGNOSIS — Z7982 Long term (current) use of aspirin: Secondary | ICD-10-CM | POA: Insufficient documentation

## 2016-08-12 DIAGNOSIS — R799 Abnormal finding of blood chemistry, unspecified: Secondary | ICD-10-CM | POA: Diagnosis present

## 2016-08-12 DIAGNOSIS — Z992 Dependence on renal dialysis: Secondary | ICD-10-CM | POA: Diagnosis not present

## 2016-08-12 DIAGNOSIS — I5032 Chronic diastolic (congestive) heart failure: Secondary | ICD-10-CM

## 2016-08-12 DIAGNOSIS — R1032 Left lower quadrant pain: Secondary | ICD-10-CM

## 2016-08-12 DIAGNOSIS — I120 Hypertensive chronic kidney disease with stage 5 chronic kidney disease or end stage renal disease: Secondary | ICD-10-CM | POA: Diagnosis not present

## 2016-08-12 DIAGNOSIS — Z79899 Other long term (current) drug therapy: Secondary | ICD-10-CM | POA: Diagnosis not present

## 2016-08-12 DIAGNOSIS — J309 Allergic rhinitis, unspecified: Secondary | ICD-10-CM | POA: Insufficient documentation

## 2016-08-12 DIAGNOSIS — N186 End stage renal disease: Secondary | ICD-10-CM | POA: Diagnosis not present

## 2016-08-12 DIAGNOSIS — R109 Unspecified abdominal pain: Secondary | ICD-10-CM | POA: Insufficient documentation

## 2016-08-12 DIAGNOSIS — J8489 Other specified interstitial pulmonary diseases: Secondary | ICD-10-CM

## 2016-08-12 LAB — BASIC METABOLIC PANEL
ANION GAP: 11 (ref 5–15)
BUN: 42 mg/dL — ABNORMAL HIGH (ref 6–20)
CHLORIDE: 95 mmol/L — AB (ref 101–111)
CO2: 29 mmol/L (ref 22–32)
Calcium: 7.9 mg/dL — ABNORMAL LOW (ref 8.9–10.3)
Creatinine, Ser: 8.93 mg/dL — ABNORMAL HIGH (ref 0.61–1.24)
GFR calc non Af Amer: 6 mL/min — ABNORMAL LOW (ref >60)
GFR, EST AFRICAN AMERICAN: 7 mL/min — AB (ref >60)
GLUCOSE: 264 mg/dL — AB (ref 65–99)
POTASSIUM: 5.4 mmol/L — AB (ref 3.5–5.1)
Sodium: 135 mmol/L (ref 135–145)

## 2016-08-12 LAB — COMPREHENSIVE METABOLIC PANEL
ALT: 23 U/L (ref 0–53)
AST: 18 U/L (ref 0–37)
Albumin: 3.8 g/dL (ref 3.5–5.2)
Alkaline Phosphatase: 209 U/L — ABNORMAL HIGH (ref 39–117)
BUN: 38 mg/dL — ABNORMAL HIGH (ref 6–23)
CALCIUM: 8.6 mg/dL (ref 8.4–10.5)
CHLORIDE: 96 meq/L (ref 96–112)
CO2: 31 meq/L (ref 19–32)
CREATININE: 7.94 mg/dL — AB (ref 0.40–1.50)
GFR: 9.2 mL/min — CL (ref >60.00)
Glucose, Bld: 222 mg/dL — ABNORMAL HIGH (ref 70–99)
POTASSIUM: 5.2 meq/L — AB (ref 3.5–5.1)
Sodium: 139 mEq/L (ref 135–145)
Total Bilirubin: 0.5 mg/dL (ref 0.2–1.2)
Total Protein: 6.6 g/dL (ref 6.0–8.3)

## 2016-08-12 LAB — CBC
HEMATOCRIT: 31.9 % — AB (ref 40.0–52.0)
HEMOGLOBIN: 10.5 g/dL — AB (ref 13.0–18.0)
MCH: 30.3 pg (ref 26.0–34.0)
MCHC: 32.9 g/dL (ref 32.0–36.0)
MCV: 92.2 fL (ref 80.0–100.0)
Platelets: 290 10*3/uL (ref 150–440)
RBC: 3.46 MIL/uL — AB (ref 4.40–5.90)
RDW: 17.8 % — ABNORMAL HIGH (ref 11.5–14.5)
WBC: 9.6 10*3/uL (ref 3.8–10.6)

## 2016-08-12 LAB — CBC WITH DIFFERENTIAL/PLATELET
BASOS PCT: 0.4 % (ref 0.0–3.0)
Basophils Absolute: 0 10*3/uL (ref 0.0–0.1)
EOS ABS: 0 10*3/uL (ref 0.0–0.7)
EOS PCT: 0.5 % (ref 0.0–5.0)
HEMATOCRIT: 32.1 % — AB (ref 39.0–52.0)
Hemoglobin: 10.5 g/dL — ABNORMAL LOW (ref 13.0–17.0)
LYMPHS PCT: 15.3 % (ref 12.0–46.0)
Lymphs Abs: 1.5 10*3/uL (ref 0.7–4.0)
MCHC: 32.8 g/dL (ref 30.0–36.0)
MCV: 90.4 fl (ref 78.0–100.0)
MONO ABS: 0.3 10*3/uL (ref 0.1–1.0)
Monocytes Relative: 2.7 % — ABNORMAL LOW (ref 3.0–12.0)
NEUTROS ABS: 8 10*3/uL — AB (ref 1.4–7.7)
Neutrophils Relative %: 81.1 % — ABNORMAL HIGH (ref 43.0–77.0)
PLATELETS: 320 10*3/uL (ref 150.0–400.0)
RBC: 3.55 Mil/uL — ABNORMAL LOW (ref 4.22–5.81)
RDW: 17.1 % — AB (ref 11.5–15.5)
WBC: 9.9 10*3/uL (ref 4.0–10.5)

## 2016-08-12 LAB — LIPASE: LIPASE: 17 U/L (ref 11.0–59.0)

## 2016-08-12 MED ORDER — ALBUTEROL SULFATE HFA 108 (90 BASE) MCG/ACT IN AERS: 2.0000 | INHALATION_SPRAY | Freq: Four times a day (QID) | RESPIRATORY_TRACT | 0 refills | Status: DC | PRN

## 2016-08-12 NOTE — Telephone Encounter (Signed)
[facility] called with critical results on pt. Creatinine of 7.94 and GFR of 9.2.

## 2016-08-12 NOTE — ED Triage Notes (Signed)
Sent in by PCP for elevated K+

## 2016-08-12 NOTE — ED Provider Notes (Signed)
[facility] Emergency Department Provider Note        Time seen: ----------------------------------------- 4:08 PM on 08/12/2016 -----------------------------------------    I have reviewed the triage vital signs and the nursing notes.   HISTORY  Chief Complaint Abnormal Lab    HPI Ryan Arnold is a 55 y.o. male who presents ER after being told by his primary care doctor that his potassium level was elevated. Patient states he went to his primary doctor because he been feeling weak. His dialysis patient, was dialyzed normally yesterday. He denies fevers, chills, chest pain or any other acute findings. He has been having some nonspecific abdominal pain. Patient is mainly here because of laboratory abnormalities.   Past Medical History:  Diagnosis Date  . Anemia of chronic disease   . BOOP (bronchiolitis obliterans with organizing pneumonia) (HCC)    a. 01/2018 s/p R thoracoscopy/Bx confirming BOOP;  b. 02/2018 high dose steroids started.  . Chest pain    a. 06/2014 Myoview ([facility]) no ischemia/infarct, nl EF.  [name]. Chronic diastolic CHF (congestive heart failure) (HCC)    a. 12/2014 Echo: EF 50-55%, mild conc LVH, mildly dil LA.  . End stage renal disease (HCC)    a. on dialysis; still makes urine.  . Essential hypertension   . Hyperlipemia   . Morbid obesity (HCC)   . OSA treated with BiPAP   . Recurrent pneumonia    a. 2/2 BOOP.  [name]. Secondary hyperparathyroidism (HCC)   . Thyroid disease   . Type 1 diabetes mellitus [facility](HCC)     Patient Active Problem List   Diagnosis Date Noted  . Abdominal pain 08/12/2016  . Acute allergic rhinitis 08/12/2016  . Trigger finger, acquired 08/12/2016  . Gastroenteritis 08/05/2016  . Impotence 06/05/2015  . Preventative health care 06/05/2015  . PVC's (premature ventricular contractions) 04/23/2015  . Anemia of chronic disease   . Secondary hyperparathyroidism (HCC)   . Morbid obesity (HCC)   . Chronic diastolic  CHF (congestive heart failure) (HCC)   . Essential hypertension   . 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 Proliferative retinopathy 03/15/2015  . CKD (chronic kidney disease) stage V requiring chronic dialysis (HCC) 03/15/2015  . Hyperlipidemia 03/15/2015  . BOOP (bronchiolitis obliterans with organizing pneumonia) (HCC) 03/01/2015  . OSA (obstructive sleep apnea) 01/18/2015  . Recurrent pneumonia 01/15/2015    Past Surgical History:  Procedure Laterality Date  . cataract Bilateral   . GALLBLADDER SURGERY    . KNEE SURGERY Right   . LUNG BIOPSY    . SHOULDER SURGERY Left     Allergies Oxycodone-acetaminophen and Tape  Social History Social History  Substance Use Topics  . Smoking status: Never Smoker  . Smokeless tobacco: Never Used  . Alcohol use No    Review of Systems Constitutional: Negative for fever. Cardiovascular: Negative for chest pain. Respiratory: Negative for shortness of breath. Gastrointestinal: Negative for abdominal pain, vomiting and diarrhea. Genitourinary: Negative for dysuria. Musculoskeletal: Negative for back pain. Skin: Negative for rash. Neurological: Negative for headaches,Positive for generalized weakness  10-point ROS otherwise negative.  ____________________________________________   PHYSICAL EXAM:  VITAL SIGNS: ED Triage Vitals  Enc Vitals Group     BP 08/12/16 1453 (!) 171/60     Pulse Rate 08/12/16 1453 80     Resp 08/12/16 1453 18     Temp 08/12/16 1453 98.1 F (36.7 C)     Temp Source 08/12/16 1453 Oral     SpO2  08/12/16 1453 98 %     Weight 08/12/16 1454 (!) 317 lb 7.4 oz (144 kg)     Height 08/12/16 1454 6\' 1"  (1.854 m)     Head Circumference --      Peak Flow --      Pain Score 08/12/16 1454 0     Pain Loc --      Pain Edu? --      Excl. in GC? --     Constitutional: Alert and oriented. Well appearing and in no distress. Eyes: Conjunctivae are normal. Normal extraocular  movements. ENT   Head: Normocephalic and atraumatic.   Nose: No congestion/rhinnorhea.   Mouth/Throat: Mucous membranes are moist.   Neck: No stridor. Cardiovascular: Normal rate, regular rhythm. No murmurs, rubs, or gallops. Respiratory: Normal respiratory effort without tachypnea nor retractions. Breath sounds are clear and equal bilaterally. No wheezes/rales/rhonchi. Gastrointestinal: Soft and nontender. Normal bowel sounds Musculoskeletal: Nontender with normal range of motion in all extremities. No lower extremity tenderness nor edema. Neurologic:  Normal speech and language. No gross focal neurologic deficits are appreciated.  Skin:  Skin is warm, dry and intact. No rash noted. Psychiatric: Mood and affect are normal. Speech and behavior are normal.  ____________________________________________  ED COURSE:  Pertinent labs & imaging results that were available during my care of the patient were reviewed by me and considered in my medical decision making (see chart for details). Clinical Course  Patient is in no distress, we'll recheck basic labs and discussed with nephrology.  Procedures ____________________________________________   LABS (pertinent positives/negatives)  Labs Reviewed  CBC - Abnormal; Notable for the following:       Result Value   RBC 3.46 (*)    Hemoglobin 10.5 (*)    HCT 31.9 (*)    RDW 17.8 (*)    All other components within normal limits  BASIC METABOLIC PANEL - Abnormal; Notable for the following:    Potassium 5.4 (*)    Chloride 95 (*)    Glucose, Bld 264 (*)    BUN 42 (*)    Creatinine, Ser 8.93 (*)    Calcium 7.9 (*)    GFR calc non Af Amer 6 (*)    GFR calc Af Amer 7 (*)    All other components within normal limits   ____________________________________________  FINAL ASSESSMENT AND PLAN  End-stage renal disease  Plan: Patient with labs as dictated above. No acute or worrisome findings are identified. He was discussed  with nephrology, he is stable for outpatient follow-up with dialysis tomorrow as scheduled.   Emily FilbertWilliams, Yoseline Andersson E, MD   Note: This dictation was prepared with Dragon dictation. Any transcriptional errors that result from this process are unintentional    Emily FilbertJonathan E Jashua Knaak, MD 08/12/16 [phone]

## 2016-08-12 NOTE — Assessment & Plan Note (Addendum)
Elevated. No signs or symptoms of hypertensive urgency or emergency at this time. Gave patient 0.1 mg clonidine and BP improved. He will monitor BP at home and call us with reading. HD tomorrow.

## 2016-08-12 NOTE — Telephone Encounter (Signed)
Spoke with patient regarding  Lab work. GFR appears to be close to baseline. Patient appears very dry. Of acute concern is hyperkalemia.   Patient will go ED due to hyperkalemia and elevated alk phos.

## 2016-08-12 NOTE — Assessment & Plan Note (Signed)
Weight is stable.Adventitious lung sounds. No orthopnea. No signs to support CHF exacerbation.

## 2016-08-12 NOTE — ED Notes (Addendum)
Pt stating that he went to his doctor because he "was feeling off." Pt stating that his LLQ and left ear were having pains off and on. Pt denying CP , n/v/d , or SOB. Pt stating that he was sent here because his potassium was elevated. Pt is aaox4. Pt is a dialysis pt that receives dialysis M, W, F. Pt has had a decrease in the last 2 week of his urinary output. Pt stating that he was normally voiding 4 times a day and it has only been once today so far. Pt was told that he might soon by aneuric

## 2016-08-12 NOTE — ED Triage Notes (Signed)
Pt presents to ED after being told by PCP his potassium level was elevated. Pt states went to PCP because he had been experiencing malaise. Pt is dialysis pt, last dialyzed yesterday. Pt in no apparent distress  In triage.

## 2016-08-12 NOTE — Assessment & Plan Note (Signed)
Symptoms consistent with trigger finger. Placed referral to orthopedics for further evaluation.

## 2016-08-12 NOTE — Progress Notes (Signed)
Subjective:    Patient ID: Ryan Arnold, male    DOB: [birth year], 55 y.o.   MRN: [id number]  CC: Ryan Arnold is a 55 y.o. male who presents today for an acute visit.    HPI: Patient here for acute visit with multiple complaints. Primary complaint of decreased urination over past week. Felt urge to urinate however unable to. Noted dysuria, flank pain. No recurrent UTIs. No concerns STDs.  He also complains of intermittent dull ache pain in lower abdomen which coincides with excessive gas over past several days, unchanged.  Ache not a/w food. Some relief when has a BM. H/o GERD. No h/o diverticulitis.[name] No chills or diarrhea. No records of colonoscopy. However per patient, was normal and had before age 59 year.   He also complains of clear nasal congestion, occasional wheezing, and cough. Albuterol inhaler has run out, only occasionally has to use it. H/o seasonal allergies. Episode of fever at HD 5 days ago, states 71 F.  No SOB, orthopnea. Chronic LE swelling. H/o CHF. Weight stable.   Patient also complains of left fourth digit getting stuck and being unable to extend finger. Requesting referral to orthopedics.  History of type 1 diabetes and chronic kidney disease, on dialysis for past 3 years.  H/O BOOP, OSA. Follows with pulmonology.   HTN- Elevated. Took metoprol this morning. Takes valsartan qhs.  Denies exertional chest pain or pressure, numbness or tingling radiating to left arm or jaw, palpitations, dizziness, frequent headaches, changes in vision, or shortness of breath.           HISTORY:  Past Medical History:  Diagnosis Date  . Anemia of chronic disease   . BOOP (bronchiolitis obliterans with organizing pneumonia) (HCC)    a. 01/2018 s/p R thoracoscopy/Bx confirming BOOP;  b. 02/2018 high dose steroids started.  . Chest pain    a. 06/2014 Myoview ([facility]) no ischemia/infarct, nl EF.  [name] Chronic diastolic CHF (congestive heart failure) (HCC)    a. 12/2014 Echo: EF  50-55%, mild conc LVH, mildly dil LA.  . End stage renal disease (HCC)    a. on dialysis; still makes urine.  . Essential hypertension   . Hyperlipemia   . Morbid obesity (HCC)   . OSA treated with BiPAP   . Recurrent pneumonia    a. 2/2 BOOP.  [name] Secondary hyperparathyroidism (HCC)   . Thyroid disease   . Type 1 diabetes mellitus (HCC)    Past Surgical History:  Procedure Laterality Date  . cataract Bilateral   . GALLBLADDER SURGERY    . KNEE SURGERY Right   . LUNG BIOPSY    . SHOULDER SURGERY Left    Family History  Problem Relation Age of Onset  . Diabetes Mother     died @ 82.  [name] Hypertension Mother   . Hypertension Maternal Grandmother     Allergies: Oxycodone-acetaminophen and Tape Current Outpatient Prescriptions on File Prior to Visit  Medication Sig Dispense Refill  . aspirin 81 MG chewable tablet Chew 1 tablet (81 mg total) by mouth daily. 30 tablet 2  . atorvastatin (LIPITOR) 20 MG tablet Take 20 mg by mouth daily.    . B-D ULTRAFINE III SHORT PEN 31G X 8 MM MISC use as directed 100 each 11  . DULoxetine (CYMBALTA) 60 MG capsule Take 2 capsules (120 mg total) by mouth daily. 90 capsule 2  . furosemide (LASIX) 80 MG tablet take 1 tablet by mouth 4 TIMES  A WEEK ,TUESDAY ,  THURSDAY ,SATURDAY AND SUNDAY 36 tablet 3  . gabapentin (NEURONTIN) 300 MG capsule Take 1 capsule (300 mg total) by mouth 3 (three) times daily. 90 capsule 3  . metoprolol (LOPRESSOR) 50 MG tablet Take 50 mg by mouth 2 (two) times daily.    . multivitamin (RENA-VIT) TABS tablet Take 1 tablet by mouth daily.    [name] NOVOLOG FLEXPEN 100 UNIT/ML FlexPen inject 35 units subcutaneously three times a day with food 30 mL 1  . ONE TOUCH ULTRA TEST test strip TEST BLOOD SUGAR four times a day 100 each 3  . pantoprazole (PROTONIX) 40 MG tablet take 1 tablet by mouth once daily 90 tablet 3  . predniSONE (DELTASONE) 5 MG tablet Take 7.5 mg by mouth daily with breakfast. Takes on Tuesday/ Thursday/ Saturday    .  SENSIPAR 30 MG tablet Take 30 mg by mouth at bedtime. Reported on 04/29/2016    . sevelamer carbonate (RENVELA) 800 MG tablet Take 800 mg by mouth 3 (three) times daily. And 2 tablet with snacks    . TRESIBA FLEXTOUCH 100 UNIT/ML SOPN inject 40 units subcutaneously once daily (Patient taking differently: Inject 46 units AM & 6 units PM) 15 mL 2  . valsartan (DIOVAN) 80 MG tablet Take 160 mg by mouth at bedtime.   0   No current facility-administered medications on file prior to visit.     Social History  Substance Use Topics  . Smoking status: Never Smoker  . Smokeless tobacco: Never Used  . Alcohol use No    Review of Systems  Constitutional: Negative for chills and fever.  HENT: Positive for congestion and ear pain (left ear). Negative for sinus pressure and sore throat.   Respiratory: Positive for cough and wheezing. Negative for shortness of breath.   Cardiovascular: Positive for leg swelling (chronic). Negative for chest pain and palpitations.  Gastrointestinal: Positive for abdominal pain and nausea. Negative for abdominal distention, constipation, diarrhea and vomiting.  Genitourinary: Positive for decreased urine volume and dysuria.      Objective:    BP (!) 148/80   Pulse 81   Temp 98.4 F (36.9 C) (Oral)   Ht 6\' 1"  (1.854 m)   Wt (!) 325 lb 12.8 oz (147.8 kg)   SpO2 95%   BMI 42.98 kg/m    Physical Exam  Constitutional: Vital signs are normal. He appears well-developed and well-nourished.  HENT:  Head: Normocephalic and atraumatic.  Right Ear: Hearing, tympanic membrane, external ear and ear canal normal. No drainage, swelling or tenderness. Tympanic membrane is not injected, not erythematous and not bulging. No middle ear effusion. No decreased hearing is noted.  Left Ear: Hearing, tympanic membrane, external ear and ear canal normal. No drainage, swelling or tenderness. Tympanic membrane is not injected, not erythematous and not bulging.  No middle ear effusion.  No decreased hearing is noted.  Nose: Nose normal. Right sinus exhibits no maxillary sinus tenderness and no frontal sinus tenderness. Left sinus exhibits no maxillary sinus tenderness and no frontal sinus tenderness.  Mouth/Throat: Uvula is midline, oropharynx is clear and moist and mucous membranes are normal. No oropharyngeal exudate, posterior oropharyngeal edema, posterior oropharyngeal erythema or tonsillar abscesses.  Eyes: Conjunctivae are normal.  Cardiovascular: Regular rhythm and normal heart sounds.   Pulmonary/Chest: Effort normal and breath sounds normal. No respiratory distress. He has no wheezes. He has no rhonchi. He has no rales.  Abdominal: Soft. Normal appearance and bowel sounds are normal. He exhibits no distension, no fluid  wave, no ascites and no mass. There is no tenderness. There is no rigidity, no rebound, no guarding, no CVA tenderness, no tenderness at McBurney's point and negative Murphy's sign.  No suprapubic tenderness.   Musculoskeletal:       Hands: Lymphadenopathy:       Head (right side): No submental, no submandibular, no tonsillar, no preauricular, no posterior auricular and no occipital adenopathy present.       Head (left side): No submental, no submandibular, no tonsillar, no preauricular, no posterior auricular and no occipital adenopathy present.    He has no cervical adenopathy.  Neurological: He is alert.  Skin: Skin is warm and dry.  Psychiatric: He has a normal mood and affect. His speech is normal and behavior is normal.  Vitals reviewed.      Assessment & Plan:   Problem List Items Addressed This Visit      Cardiovascular and Mediastinum   Chronic diastolic CHF (congestive heart failure) (HCC)    Weight is stable.Adventitious lung sounds. No orthopnea. No signs to support CHF exacerbation.       Essential hypertension    Elevated. No signs or symptoms of hypertensive urgency or emergency at this time. Gave patient 0.1 mg clonidine and BP  improved. He will monitor BP at home and call us with reading. HD tomorrow.         Respiratory   BOOP (bronchiolitis obliterans with organizing pneumonia) (HCC)    Occasional wheeze. No adventitious lung sounds on exam. Refilled patient's albuterol inhaler for when necessary use. Advised caution when using as can worsen CHF; however wanted patient to have for occasional wheeze.       Relevant Medications   albuterol (PROVENTIL HFA) 108 (90 Base) MCG/ACT inhaler   Acute allergic rhinitis    History of seasonal allergies. Clear rhinorrhea. Afebrile. Patient and I jointly agreed on conservative therapy with over-the-counter Zyrtec. Return precautions given.        Musculoskeletal and Integument   Trigger finger, acquired    Symptoms consistent with trigger finger. Placed referral to orthopedics for further evaluation.      Relevant Orders   Ambulatory referral to Orthopedic Surgery     Other   Abdominal pain    Intermittent, dull ache per patient. Afebrile. No tenderness on exam.  Considering viral gastroenteritis. Referral for colonoscopy as  Patient appears overdue. Patient is afebrile and abdominal pain comes and goes; onset coincides with excessive gas. Patient and I jointly agreed no abdominal imaging at this time. Pending labs. If patient's pain worsens or any new symptoms present, he will let me know and we will further evaluate with likely imaging.       Relevant Orders   CBC with Differential/Platelet   Comprehensive metabolic panel   Lipase    Other Visit Diagnoses    Screen for colon cancer    -  Primary   Relevant Orders   Ambulatory referral to Gastroenterology   Dysuria           I am having [name] start on albuterol. I am also having him maintain his multivitamin, metoprolol, SENSIPAR, sevelamer carbonate, aspirin, valsartan, B-D ULTRAFINE III SHORT PEN, furosemide, pantoprazole, TRESIBA FLEXTOUCH, ONE TOUCH ULTRA TEST, gabapentin, DULoxetine, NOVOLOG  FLEXPEN, predniSONE, and atorvastatin.   Meds ordered this encounter  Medications  . albuterol (PROVENTIL HFA) 108 (90 Base) MCG/ACT inhaler    Sig: Inhale 2 puffs into the lungs every 6 (six) hours as needed for wheezing or  shortness of breath.    Dispense:  1 Inhaler    Refill:  0    Order Specific Question:   Supervising Provider    Answer:   Sherlene ShamsULLO, TERESA L [[id number]]    Return precautions given.   Risks, benefits, and alternatives of the medications and treatment plan prescribed today were discussed, and patient expressed understanding.   Education regarding symptom management and diagnosis given to patient on AVS.  Continue to follow with Tommie SamsJayce G Cook, DO for routine health maintenance.   Karsten FellsMyron W Zerbe and I agreed with plan.   Rennie PlowmanMargaret Tyrik Stetzer, FNP

## 2016-08-12 NOTE — Assessment & Plan Note (Signed)
History of seasonal allergies. Clear rhinorrhea. Afebrile. Patient and I jointly agreed on conservative therapy with over-the-counter Zyrtec. Return precautions given.

## 2016-08-12 NOTE — Assessment & Plan Note (Addendum)
Intermittent, dull ache per patient. Afebrile. No tenderness on exam.  Considering viral gastroenteritis. Referral for colonoscopy as  Patient appears overdue. Patient is afebrile and abdominal pain comes and goes; onset coincides with excessive gas. Patient and I jointly agreed no abdominal imaging at this time. Pending labs. If patient's pain worsens or any new symptoms present, he will let me know and we will further evaluate with likely imaging.

## 2016-08-12 NOTE — Patient Instructions (Addendum)
Labs.  Urine speciman as soon as you can.  Stay vigilant with abdominal ache, if worsens or new symptom presents, please let me know asap and we will further evaluate/.  Try claritin at home for runny nose.   Take your BP at home and call us with reading.  Please monitor the next couple of days.   May try capsaicin OTC on hands to see if helps. If not come back and we will re evaluate.

## 2016-08-12 NOTE — Progress Notes (Signed)
Pre visit review using our clinic review tool, if applicable. No additional management support is needed unless otherwise documented below in the visit note. 

## 2016-08-12 NOTE — Assessment & Plan Note (Signed)
Occasional wheeze. No adventitious lung sounds on exam. Refilled patient's albuterol inhaler for when necessary use. Advised caution when using as can worsen CHF; however wanted patient to have for occasional wheeze.

## 2016-08-12 NOTE — Telephone Encounter (Signed)
Noted. Called patient.

## 2016-08-13 DIAGNOSIS — N2581 Secondary hyperparathyroidism of renal origin: Secondary | ICD-10-CM | POA: Diagnosis not present

## 2016-08-13 DIAGNOSIS — D631 Anemia in chronic kidney disease: Secondary | ICD-10-CM | POA: Diagnosis not present

## 2016-08-13 DIAGNOSIS — Z992 Dependence on renal dialysis: Secondary | ICD-10-CM | POA: Diagnosis not present

## 2016-08-13 DIAGNOSIS — D509 Iron deficiency anemia, unspecified: Secondary | ICD-10-CM | POA: Diagnosis not present

## 2016-08-13 DIAGNOSIS — N186 End stage renal disease: Secondary | ICD-10-CM | POA: Diagnosis not present

## 2016-08-14 ENCOUNTER — Ambulatory Visit: Payer: BC Managed Care – PPO | Admitting: Internal Medicine

## 2016-08-15 DIAGNOSIS — N2581 Secondary hyperparathyroidism of renal origin: Secondary | ICD-10-CM | POA: Diagnosis not present

## 2016-08-15 DIAGNOSIS — D509 Iron deficiency anemia, unspecified: Secondary | ICD-10-CM | POA: Diagnosis not present

## 2016-08-15 DIAGNOSIS — D631 Anemia in chronic kidney disease: Secondary | ICD-10-CM | POA: Diagnosis not present

## 2016-08-15 DIAGNOSIS — Z992 Dependence on renal dialysis: Secondary | ICD-10-CM | POA: Diagnosis not present

## 2016-08-15 DIAGNOSIS — N186 End stage renal disease: Secondary | ICD-10-CM | POA: Diagnosis not present

## 2016-08-18 DIAGNOSIS — N2581 Secondary hyperparathyroidism of renal origin: Secondary | ICD-10-CM | POA: Diagnosis not present

## 2016-08-18 DIAGNOSIS — N186 End stage renal disease: Secondary | ICD-10-CM | POA: Diagnosis not present

## 2016-08-18 DIAGNOSIS — D631 Anemia in chronic kidney disease: Secondary | ICD-10-CM | POA: Diagnosis not present

## 2016-08-18 DIAGNOSIS — D509 Iron deficiency anemia, unspecified: Secondary | ICD-10-CM | POA: Diagnosis not present

## 2016-08-18 DIAGNOSIS — Z992 Dependence on renal dialysis: Secondary | ICD-10-CM | POA: Diagnosis not present

## 2016-08-20 DIAGNOSIS — N2581 Secondary hyperparathyroidism of renal origin: Secondary | ICD-10-CM | POA: Diagnosis not present

## 2016-08-20 DIAGNOSIS — D631 Anemia in chronic kidney disease: Secondary | ICD-10-CM | POA: Diagnosis not present

## 2016-08-20 DIAGNOSIS — N186 End stage renal disease: Secondary | ICD-10-CM | POA: Diagnosis not present

## 2016-08-20 DIAGNOSIS — Z992 Dependence on renal dialysis: Secondary | ICD-10-CM | POA: Diagnosis not present

## 2016-08-20 DIAGNOSIS — D509 Iron deficiency anemia, unspecified: Secondary | ICD-10-CM | POA: Diagnosis not present

## 2016-08-22 DIAGNOSIS — Z992 Dependence on renal dialysis: Secondary | ICD-10-CM | POA: Diagnosis not present

## 2016-08-22 DIAGNOSIS — N2581 Secondary hyperparathyroidism of renal origin: Secondary | ICD-10-CM | POA: Diagnosis not present

## 2016-08-22 DIAGNOSIS — D509 Iron deficiency anemia, unspecified: Secondary | ICD-10-CM | POA: Diagnosis not present

## 2016-08-22 DIAGNOSIS — N186 End stage renal disease: Secondary | ICD-10-CM | POA: Diagnosis not present

## 2016-08-22 DIAGNOSIS — D631 Anemia in chronic kidney disease: Secondary | ICD-10-CM | POA: Diagnosis not present

## 2016-08-23 ENCOUNTER — Other Ambulatory Visit: Payer: Self-pay | Admitting: Endocrinology

## 2016-08-25 DIAGNOSIS — D631 Anemia in chronic kidney disease: Secondary | ICD-10-CM | POA: Diagnosis not present

## 2016-08-25 DIAGNOSIS — N186 End stage renal disease: Secondary | ICD-10-CM | POA: Diagnosis not present

## 2016-08-25 DIAGNOSIS — D509 Iron deficiency anemia, unspecified: Secondary | ICD-10-CM | POA: Diagnosis not present

## 2016-08-25 DIAGNOSIS — N2581 Secondary hyperparathyroidism of renal origin: Secondary | ICD-10-CM | POA: Diagnosis not present

## 2016-08-25 DIAGNOSIS — Z992 Dependence on renal dialysis: Secondary | ICD-10-CM | POA: Diagnosis not present

## 2016-08-26 ENCOUNTER — Telehealth: Payer: Self-pay

## 2016-08-26 ENCOUNTER — Other Ambulatory Visit: Payer: Self-pay

## 2016-08-26 NOTE — Telephone Encounter (Signed)
Please precert for [facility] on 09/[facility]/16.

## 2016-08-26 NOTE — Telephone Encounter (Signed)
Gastroenterology Pre-Procedure Review  Request Date: 09/11/16 Requesting Physician: Dr. Dorie RankJayce  PATIENT REVIEW QUESTIONS: The patient responded to the following health history questions as indicated:    1. Are you having any GI issues? no 2. Do you have a personal history of Polyps? yes ( polyps) 3. Do you have a family history of Colon Cancer or Polyps? no 4. Diabetes Mellitus? yes (Type 1) 5. Joint replacements in the past 12 months?no 6. Major health problems in the past 3 months?no 7. Any artificial heart valves, MVP, or defibrillator?no    MEDICATIONS & ALLERGIES:    Patient reports the following regarding taking any anticoagulation/antiplatelet therapy:   Plavix, Coumadin, Eliquis, Xarelto, Lovenox, Pradaxa, Brilinta, or Effient? no Aspirin? yes (ASA 81mg )  Patient confirms/reports the following medications:  Current Outpatient Prescriptions  Medication Sig Dispense Refill  . albuterol (PROVENTIL HFA) 108 (90 Base) MCG/ACT inhaler Inhale 2 puffs into the lungs every 6 (six) hours as needed for wheezing or shortness of breath. 1 Inhaler 0  . aspirin 81 MG chewable tablet Chew 1 tablet (81 mg total) by mouth daily. 30 tablet 2  . atorvastatin (LIPITOR) 20 MG tablet Take 20 mg by mouth daily.    . B-D ULTRAFINE III SHORT PEN 31G X 8 MM MISC use as directed 100 each 11  . DULoxetine (CYMBALTA) 60 MG capsule Take 2 capsules (120 mg total) by mouth daily. 90 capsule 2  . furosemide (LASIX) 80 MG tablet take 1 tablet by mouth 4 TIMES  A WEEK ,TUESDAY ,THURSDAY ,SATURDAY AND SUNDAY 36 tablet 3  . gabapentin (NEURONTIN) 300 MG capsule Take 1 capsule (300 mg total) by mouth 3 (three) times daily. 90 capsule 3  . metoprolol (LOPRESSOR) 50 MG tablet Take 50 mg by mouth 2 (two) times daily.    . multivitamin (RENA-VIT) TABS tablet Take 1 tablet by mouth daily.    [name]. NOVOLOG FLEXPEN 100 UNIT/ML FlexPen inject 35 units subcutaneously three times a day with food 30 mL 1  . ONE TOUCH ULTRA TEST  test strip TEST BLOOD SUGAR four times a day 100 each 3  . pantoprazole (PROTONIX) 40 MG tablet take 1 tablet by mouth once daily 90 tablet 3  . predniSONE (DELTASONE) 5 MG tablet Take 7.5 mg by mouth daily with breakfast. Takes on Tuesday/ Thursday/ Saturday    . SENSIPAR 30 MG tablet Take 30 mg by mouth at bedtime. Reported on 04/29/2016    . sevelamer carbonate (RENVELA) 800 MG tablet Take 800 mg by mouth 3 (three) times daily. And 2 tablet with snacks    . TRESIBA FLEXTOUCH 100 UNIT/ML SOPN FlexTouch Pen inject 40 units subcutaneously once daily 15 mL 2  . valsartan (DIOVAN) 80 MG tablet Take 160 mg by mouth at bedtime.   0   No current facility-administered medications for this visit.     Patient confirms/reports the following allergies:  Allergies  Allergen Reactions  . Oxycodone-Acetaminophen Nausea And Vomiting  . Tape Other (See Comments)    Paper tape caused blisters    No orders of the defined types were placed in this encounter.   AUTHORIZATION INFORMATION Primary Insurance: 1D#: Group #:  Secondary Insurance: 1D#: Group #:  SCHEDULE INFORMATION: Date: 09/11/16 Time: Location: [facility]

## 2016-08-27 DIAGNOSIS — D509 Iron deficiency anemia, unspecified: Secondary | ICD-10-CM | POA: Diagnosis not present

## 2016-08-27 DIAGNOSIS — N2581 Secondary hyperparathyroidism of renal origin: Secondary | ICD-10-CM | POA: Diagnosis not present

## 2016-08-27 DIAGNOSIS — Z992 Dependence on renal dialysis: Secondary | ICD-10-CM | POA: Diagnosis not present

## 2016-08-27 DIAGNOSIS — N186 End stage renal disease: Secondary | ICD-10-CM | POA: Diagnosis not present

## 2016-08-27 DIAGNOSIS — D631 Anemia in chronic kidney disease: Secondary | ICD-10-CM | POA: Diagnosis not present

## 2016-08-28 ENCOUNTER — Other Ambulatory Visit: Payer: Self-pay | Admitting: Family

## 2016-08-28 ENCOUNTER — Encounter: Payer: Self-pay | Admitting: Urgent Care

## 2016-08-28 ENCOUNTER — Observation Stay
Admission: EM | Admit: 2016-08-28 | Discharge: 2016-08-30 | Disposition: A | Discharge status: Home / Self Care | Payer: Medicare Other | Attending: Internal Medicine | Admitting: Internal Medicine

## 2016-08-28 ENCOUNTER — Emergency Department: Payer: Medicare Other

## 2016-08-28 DIAGNOSIS — E039 Hypothyroidism, unspecified: Secondary | ICD-10-CM | POA: Insufficient documentation

## 2016-08-28 DIAGNOSIS — R0602 Shortness of breath: Secondary | ICD-10-CM | POA: Diagnosis not present

## 2016-08-28 DIAGNOSIS — Z7982 Long term (current) use of aspirin: Secondary | ICD-10-CM | POA: Insufficient documentation

## 2016-08-28 DIAGNOSIS — N2581 Secondary hyperparathyroidism of renal origin: Secondary | ICD-10-CM | POA: Insufficient documentation

## 2016-08-28 DIAGNOSIS — E103599 Type 1 diabetes mellitus with proliferative diabetic retinopathy without macular edema, unspecified eye: Secondary | ICD-10-CM | POA: Diagnosis not present

## 2016-08-28 DIAGNOSIS — M109 Gout, unspecified: Secondary | ICD-10-CM | POA: Diagnosis not present

## 2016-08-28 DIAGNOSIS — D631 Anemia in chronic kidney disease: Secondary | ICD-10-CM | POA: Insufficient documentation

## 2016-08-28 DIAGNOSIS — Z885 Allergy status to narcotic agent status: Secondary | ICD-10-CM | POA: Insufficient documentation

## 2016-08-28 DIAGNOSIS — I120 Hypertensive chronic kidney disease with stage 5 chronic kidney disease or end stage renal disease: Secondary | ICD-10-CM | POA: Diagnosis not present

## 2016-08-28 DIAGNOSIS — J8489 Other specified interstitial pulmonary diseases: Secondary | ICD-10-CM | POA: Diagnosis not present

## 2016-08-28 DIAGNOSIS — N186 End stage renal disease: Secondary | ICD-10-CM

## 2016-08-28 DIAGNOSIS — I5033 Acute on chronic diastolic (congestive) heart failure: Secondary | ICD-10-CM | POA: Diagnosis not present

## 2016-08-28 DIAGNOSIS — Z8249 Family history of ischemic heart disease and other diseases of the circulatory system: Secondary | ICD-10-CM | POA: Diagnosis not present

## 2016-08-28 DIAGNOSIS — Z833 Family history of diabetes mellitus: Secondary | ICD-10-CM | POA: Diagnosis not present

## 2016-08-28 DIAGNOSIS — I509 Heart failure, unspecified: Secondary | ICD-10-CM | POA: Diagnosis not present

## 2016-08-28 DIAGNOSIS — Z992 Dependence on renal dialysis: Secondary | ICD-10-CM | POA: Insufficient documentation

## 2016-08-28 DIAGNOSIS — R0902 Hypoxemia: Secondary | ICD-10-CM

## 2016-08-28 DIAGNOSIS — I132 Hypertensive heart and chronic kidney disease with heart failure and with stage 5 chronic kidney disease, or end stage renal disease: Secondary | ICD-10-CM | POA: Insufficient documentation

## 2016-08-28 DIAGNOSIS — G4733 Obstructive sleep apnea (adult) (pediatric): Secondary | ICD-10-CM | POA: Insufficient documentation

## 2016-08-28 DIAGNOSIS — E559 Vitamin D deficiency, unspecified: Secondary | ICD-10-CM | POA: Insufficient documentation

## 2016-08-28 DIAGNOSIS — E785 Hyperlipidemia, unspecified: Secondary | ICD-10-CM | POA: Diagnosis not present

## 2016-08-28 DIAGNOSIS — N185 Chronic kidney disease, stage 5: Secondary | ICD-10-CM | POA: Diagnosis not present

## 2016-08-28 DIAGNOSIS — Z888 Allergy status to other drugs, medicaments and biological substances status: Secondary | ICD-10-CM | POA: Diagnosis not present

## 2016-08-28 DIAGNOSIS — J9601 Acute respiratory failure with hypoxia: Secondary | ICD-10-CM | POA: Diagnosis not present

## 2016-08-28 DIAGNOSIS — E1042 Type 1 diabetes mellitus with diabetic polyneuropathy: Secondary | ICD-10-CM | POA: Insufficient documentation

## 2016-08-28 DIAGNOSIS — Z6841 Body Mass Index (BMI) 40.0 and over, adult: Secondary | ICD-10-CM | POA: Diagnosis not present

## 2016-08-28 DIAGNOSIS — E1022 Type 1 diabetes mellitus with diabetic chronic kidney disease: Secondary | ICD-10-CM | POA: Diagnosis not present

## 2016-08-28 DIAGNOSIS — Z794 Long term (current) use of insulin: Secondary | ICD-10-CM | POA: Insufficient documentation

## 2016-08-28 LAB — CBC WITH DIFFERENTIAL/PLATELET
BASOS PCT: 1 %
Basophils Absolute: 0.1 10*3/uL (ref 0–0.1)
EOS ABS: 0.2 10*3/uL (ref 0–0.7)
EOS PCT: 1 %
HCT: 34.6 % — ABNORMAL LOW (ref 40.0–52.0)
HEMOGLOBIN: 11.4 g/dL — AB (ref 13.0–18.0)
LYMPHS ABS: 2 10*3/uL (ref 1.0–3.6)
Lymphocytes Relative: 17 %
MCH: 29.5 pg (ref 26.0–34.0)
MCHC: 32.9 g/dL (ref 32.0–36.0)
MCV: 89.7 fL (ref 80.0–100.0)
MONOS PCT: 6 %
Monocytes Absolute: 0.7 10*3/uL (ref 0.2–1.0)
NEUTROS PCT: 75 %
Neutro Abs: 9.2 10*3/uL — ABNORMAL HIGH (ref 1.4–6.5)
PLATELETS: 354 10*3/uL (ref 150–440)
RBC: 3.86 MIL/uL — AB (ref 4.40–5.90)
RDW: 17.3 % — ABNORMAL HIGH (ref 11.5–14.5)
WBC: 12.1 10*3/uL — AB (ref 3.8–10.6)

## 2016-08-28 LAB — COMPREHENSIVE METABOLIC PANEL
ALK PHOS: 202 U/L — AB (ref 38–126)
ALT: 32 U/L (ref 17–63)
AST: 31 U/L (ref 15–41)
Albumin: 4 g/dL (ref 3.5–5.0)
Anion gap: 13 (ref 5–15)
BUN: 56 mg/dL — AB (ref 6–20)
CALCIUM: 7.9 mg/dL — AB (ref 8.9–10.3)
CO2: 28 mmol/L (ref 22–32)
CREATININE: 9.87 mg/dL — AB (ref 0.61–1.24)
Chloride: 97 mmol/L — ABNORMAL LOW (ref 101–111)
GFR, EST AFRICAN AMERICAN: 6 mL/min — AB (ref >60)
GFR, EST NON AFRICAN AMERICAN: 5 mL/min — AB (ref >60)
Glucose, Bld: 258 mg/dL — ABNORMAL HIGH (ref 65–99)
Potassium: 4.7 mmol/L (ref 3.5–5.1)
Sodium: 138 mmol/L (ref 135–145)
Total Bilirubin: 0.8 mg/dL (ref 0.3–1.2)
Total Protein: 7.9 g/dL (ref 6.5–8.1)

## 2016-08-28 MED ORDER — FUROSEMIDE 10 MG/ML IJ SOLN
60.0000 mg | Freq: Once | INTRAMUSCULAR | Status: AC
  Administered 2016-08-29: 60 mg via INTRAVENOUS
  Filled 2016-08-28: qty 8

## 2016-08-28 MED ORDER — ALBUTEROL SULFATE (2.5 MG/3ML) 0.083% IN NEBU
5.0000 mg | INHALATION_SOLUTION | Freq: Once | RESPIRATORY_TRACT | Status: AC
  Administered 2016-08-28: 5 mg via RESPIRATORY_TRACT
  Filled 2016-08-28: qty 6

## 2016-08-28 NOTE — ED Triage Notes (Addendum)
Patient presents with worsening SOB, mostly exertional, that began earlier today. Patient on supplemental oxygen PRN; generally uses with his CPAP at night. Patient states, "I thought I was weaning myself off of it"; had to replace tonight at 6L/[place] due to sats in the 80s; presents with stas of 97% on the 6L/[place]. Patient on HD; W-M-F schedule; last treatment was yesterday.

## 2016-08-28 NOTE — ED Notes (Signed)
MD at bedside. 

## 2016-08-28 NOTE — Telephone Encounter (Signed)
Patient has [employer]. [place] is not required

## 2016-08-28 NOTE — ED Notes (Signed)
Attempted several times to get BP; unable to obtain. Primary nurse made aware.

## 2016-08-28 NOTE — ED Provider Notes (Signed)
[facility] Emergency Department Provider Note   ____________________________________________   First MD Initiated Contact with Patient 08/28/16 2319     (approximate)  I have reviewed the triage vital signs and the nursing notes.   HISTORY  Chief Complaint Shortness of Breath    HPI Ryan Arnold is a 55 y.o. male who presents to the ED from home with a chief complaint of progressive shortness of breath. Patient has a history of BOOP, CHF, ESRD on HD M/W/F who complains of worsening exertional shortness of breath which began earlier in the afternoon. Patient received his full dialysis yesterday. Also takes furosemide on nondialysis days. Took an extra furosemide this evening secondary to progressive shortness of breath. Complains of BLE edema. Wears supplemental oxygen as needed; increased oxygen to 6L per nasal cannula at home secondary to saturations in the 80%s.Denies associated fever, chills, chest pain, abdominal pain, nausea, vomiting, diarrhea. Patient does make urine daily. Denies recent travel trauma. Nothing makes his symptoms better. Exertion makes his symptoms worse.   Past Medical History:  Diagnosis Date  . Anemia of chronic disease   . BOOP (bronchiolitis obliterans with organizing pneumonia) (HCC)    a. 01/2018 s/p R thoracoscopy/Bx confirming BOOP;  b. 02/2018 high dose steroids started.  . Chest pain    a. 06/2014 Myoview ([facility]) no ischemia/infarct, nl EF.  [name]. Chronic diastolic CHF (congestive heart failure) (HCC)    a. 12/2014 Echo: EF 50-55%, mild conc LVH, mildly dil LA.  . End stage renal disease (HCC)    a. on dialysis; still makes urine.  . Essential hypertension   . Hyperlipemia   . Morbid obesity (HCC)   . OSA treated with BiPAP   . Recurrent pneumonia    a. 2/2 BOOP.  [name]. Secondary hyperparathyroidism (HCC)   . Thyroid disease   . Type 1 diabetes mellitus [facility](HCC)     Patient Active Problem List   Diagnosis Date Noted  .  Abdominal pain 08/12/2016  . Acute allergic rhinitis 08/12/2016  . Trigger finger, acquired 08/12/2016  . Gastroenteritis 08/05/2016  . Impotence 06/05/2015  . Preventative health care 06/05/2015  . PVC's (premature ventricular contractions) 04/23/2015  . Anemia of chronic disease   . Secondary hyperparathyroidism (HCC)   . Morbid obesity (HCC)   . Chronic diastolic CHF (congestive heart failure) (HCC)   . Essential hypertension   . 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 Proliferative retinopathy 03/15/2015  . CKD (chronic kidney disease) stage V requiring chronic dialysis (HCC) 03/15/2015  . Hyperlipidemia 03/15/2015  . BOOP (bronchiolitis obliterans with organizing pneumonia) (HCC) 03/01/2015  . OSA (obstructive sleep apnea) 01/18/2015  . Recurrent pneumonia 01/15/2015    Past Surgical History:  Procedure Laterality Date  . cataract Bilateral   . GALLBLADDER SURGERY    . KNEE SURGERY Right   . LUNG BIOPSY    . SHOULDER SURGERY Left     Prior to Admission medications   Medication Sig Start Date End Date Taking? Authorizing Provider  albuterol (PROVENTIL HFA) 108 (90 Base) MCG/ACT inhaler Inhale 2 puffs into the lungs every 6 (six) hours as needed for wheezing or shortness of breath. 08/12/16   Allegra GranaMargaret G Arnett, FNP  aspirin 81 MG chewable tablet Chew 1 tablet (81 mg total) by mouth daily. 03/21/15   Enid Baasadhika Kalisetti, MD  atorvastatin (LIPITOR) 20 MG tablet Take 20 mg by mouth daily.    Historical Provider, MD  B-D ULTRAFINE III SHORT PEN  31G X 8 MM MISC use as directed 06/07/15   Tommie SamsJayce G Cook, DO  DULoxetine (CYMBALTA) 60 MG capsule Take 2 capsules (120 mg total) by mouth daily. 06/03/16   Tommie SamsJayce G Cook, DO  furosemide (LASIX) 80 MG tablet take 1 tablet by mouth 4 TIMES  A WEEK ,TUESDAY ,THURSDAY ,SATURDAY AND SUNDAY 12/27/15   Delma Freezeina A Hackney, FNP  gabapentin (NEURONTIN) 300 MG capsule Take 1 capsule (300 mg total) by mouth 3 (three) times daily.  04/29/16   Reather LittlerAjay Kumar, MD  metoprolol (LOPRESSOR) 50 MG tablet Take 50 mg by mouth 2 (two) times daily. 12/23/14   Historical Provider, MD  multivitamin (RENA-VIT) TABS tablet Take 1 tablet by mouth daily. 01/09/14   Historical Provider, MD  NOVOLOG FLEXPEN 100 UNIT/ML FlexPen inject 35 units subcutaneously three times a day with food 07/21/16   Reather LittlerAjay Kumar, MD  ONE TOUCH ULTRA TEST test strip TEST BLOOD SUGAR four times a day 04/14/16   Tommie SamsJayce G Cook, DO  pantoprazole (PROTONIX) 40 MG tablet take 1 tablet by mouth once daily 03/20/16   Tommie SamsJayce G Cook, DO  predniSONE (DELTASONE) 5 MG tablet Take 7.5 mg by mouth daily with breakfast. Takes on Tuesday/ Thursday/ Saturday    Historical Provider, MD  SENSIPAR 30 MG tablet Take 30 mg by mouth at bedtime. Reported on 04/29/2016 12/18/14   Historical Provider, MD  sevelamer carbonate (RENVELA) 800 MG tablet Take 800 mg by mouth 3 (three) times daily. And 2 tablet with snacks    Historical Provider, MD  TRESIBA FLEXTOUCH 100 UNIT/ML SOPN FlexTouch Pen inject 40 units subcutaneously once daily 08/25/16   Reather LittlerAjay Kumar, MD  valsartan (DIOVAN) 80 MG tablet Take 160 mg by mouth at bedtime.  05/29/15   Historical Provider, MD    Allergies Oxycodone-acetaminophen and Tape  Family History  Problem Relation Age of Onset  . Diabetes Mother     died @ 8262.  [name]. Hypertension Mother   . Hypertension Maternal Grandmother     Social History Social History  Substance Use Topics  . Smoking status: Never Smoker  . Smokeless tobacco: Never Used  . Alcohol use No    Review of Systems  Constitutional: No fever/chills. Eyes: No visual changes. ENT: No sore throat. Cardiovascular: Denies chest pain. Respiratory: Positive for shortness of breath. Gastrointestinal: No abdominal pain.  No nausea, no vomiting.  No diarrhea.  No constipation. Genitourinary: Negative for dysuria. Musculoskeletal: Positive for BLE edema. Negative for back pain. Skin: Negative for  rash. Neurological: Negative for headaches, focal weakness or numbness.  10-point ROS otherwise negative.  ____________________________________________   PHYSICAL EXAM:  VITAL SIGNS: ED Triage Vitals  Enc Vitals Group     BP 08/28/16 2332 (!) 142/78     Pulse Rate 08/28/16 2249 74     Resp 08/28/16 2249 (!) 26     Temp 08/28/16 2249 98.9 F (37.2 C)     Temp src --      SpO2 08/28/16 2249 97 %     Weight 08/28/16 2249 (!) 320 lb (145.2 kg)     Height 08/28/16 2249 6\' 1"  (1.854 m)     Head Circumference --      Peak Flow --      Pain Score 08/28/16 2249 0     Pain Loc --      Pain Edu? --      Excl. in GC? --     Constitutional: Alert and oriented. Uncomfortable appearing and in moderate  acute distress. Eyes: Conjunctivae are normal. PERRL. EOMI. Head: Atraumatic. Nose: No congestion/rhinnorhea. Mouth/Throat: Mucous membranes are moist.  Oropharynx non-erythematous. Neck: No stridor.   Cardiovascular: Normal rate, regular rhythm. Grossly normal heart sounds.  Good peripheral circulation. Respiratory: Increased respiratory effort.  No retractions. Lungs with rales bilaterally. Gastrointestinal: Obese. Soft and nontender. No distention. No abdominal bruits. No CVA tenderness. Musculoskeletal: No lower extremity tenderness. 1+ BLE pitting edema.  No joint effusions. Neurologic:  Normal speech and language. No gross focal neurologic deficits are appreciated.  Skin:  Skin is warm, dry and intact. No rash noted. Psychiatric: Mood and affect are normal. Speech and behavior are normal.  ____________________________________________   LABS (all labs ordered are listed, but only abnormal results are displayed)  Labs Reviewed  COMPREHENSIVE METABOLIC PANEL - Abnormal; Notable for the following:       Result Value   Chloride 97 (*)    Glucose, Bld 258 (*)    BUN 56 (*)    Creatinine, Ser 9.87 (*)    Calcium 7.9 (*)    Alkaline Phosphatase 202 (*)    GFR calc non Af Amer 5  (*)    GFR calc Af Amer 6 (*)    All other components within normal limits  CBC WITH DIFFERENTIAL/PLATELET - Abnormal; Notable for the following:    WBC 12.1 (*)    RBC 3.86 (*)    Hemoglobin 11.4 (*)    HCT 34.6 (*)    RDW 17.3 (*)    Neutro Abs 9.2 (*)    All other components within normal limits  TROPONIN I  BRAIN NATRIURETIC PEPTIDE   ____________________________________________  EKG  ED ECG REPORT I, Avantika Shere J, the attending physician, personally viewed and interpreted this ECG.   Date: 08/28/2016  EKG Time: 2307  Rate: 75  Rhythm: normal EKG, normal sinus rhythm  Axis: LAD  Intervals:none  ST&T Change: Nonspecific  ____________________________________________  RADIOLOGY  Chest 2 view (view by me, interpreted per Dr. Clovis Riley): The findings probably represent congestive heart failure. ____________________________________________   PROCEDURES  Procedure(s) performed: None  Procedures  Critical Care performed: Yes, see critical care note(s)  CRITICAL CARE Performed by: Irean Hong   Total critical care time: 30 minutes  Critical care time was exclusive of separately billable procedures and treating other patients.  Critical care was necessary to treat or prevent imminent or life-threatening deterioration.  Critical care was time spent personally by me on the following activities: development of treatment plan with patient and/or surrogate as well as nursing, discussions with consultants, evaluation of patient's response to treatment, examination of patient, obtaining history from patient or surrogate, ordering and performing treatments and interventions, ordering and review of laboratory studies, ordering and review of radiographic studies, pulse oximetry and re-evaluation of patient's condition. ____________________________________________   INITIAL IMPRESSION / ASSESSMENT AND PLAN / ED COURSE  Pertinent labs & imaging results that were available  during my care of the patient were reviewed by me and considered in my medical decision making (see chart for details).  55 year old male with CHF, ESRD on HD who presents with progressive shortness of breath exacerbated with exertion. Room air saturations at home were in the 80%s. On 6 L nasal cannula patient is 98%. I examined patient after his return from x-ray. Just standing for the x-ray made him tachypneic and diaphoretic. Given patient makes urine, will administer IV furosemide for additional diuresis. Will discuss with hospitalist to evaluate patient in the emergency department for admission.  Clinical  Course      ____________________________________________   FINAL CLINICAL IMPRESSION(S) / ED DIAGNOSES  Final diagnoses:  Hypoxia  SOB (shortness of breath)  Congestive heart failure, unspecified congestive heart failure chronicity, unspecified congestive heart failure type (HCC)  Stage 5 chronic kidney disease on chronic dialysis (HCC)      NEW MEDICATIONS STARTED DURING THIS VISIT:  New Prescriptions   No medications on file     Note:  This document was prepared using Dragon voice recognition software and may include unintentional dictation errors.    Irean Hong, MD 08/29/16 [phone]

## 2016-08-29 DIAGNOSIS — N186 End stage renal disease: Secondary | ICD-10-CM | POA: Diagnosis not present

## 2016-08-29 DIAGNOSIS — D631 Anemia in chronic kidney disease: Secondary | ICD-10-CM | POA: Diagnosis not present

## 2016-08-29 DIAGNOSIS — R0602 Shortness of breath: Secondary | ICD-10-CM | POA: Diagnosis present

## 2016-08-29 DIAGNOSIS — N2581 Secondary hyperparathyroidism of renal origin: Secondary | ICD-10-CM | POA: Diagnosis not present

## 2016-08-29 DIAGNOSIS — I120 Hypertensive chronic kidney disease with stage 5 chronic kidney disease or end stage renal disease: Secondary | ICD-10-CM | POA: Diagnosis not present

## 2016-08-29 LAB — GLUCOSE, CAPILLARY
Glucose-Capillary: 120 mg/dL — ABNORMAL HIGH (ref 65–99)
Glucose-Capillary: 52 mg/dL — ABNORMAL LOW (ref 65–99)
Glucose-Capillary: 78 mg/dL (ref 65–99)
Glucose-Capillary: 244 mg/dL — ABNORMAL HIGH (ref 65–99)
Glucose-Capillary: 499 mg/dL — ABNORMAL HIGH (ref 65–99)
Glucose-Capillary: 544 mg/dL (ref 65–99)
Glucose-Capillary: 540 mg/dL (ref 65–99)
Glucose-Capillary: 426 mg/dL — ABNORMAL HIGH (ref 65–99)
Glucose-Capillary: 195 mg/dL — ABNORMAL HIGH (ref 65–99)

## 2016-08-29 LAB — BASIC METABOLIC PANEL
Anion gap: 12 (ref 5–15)
BUN: 40 mg/dL — AB (ref 6–20)
CHLORIDE: 94 mmol/L — AB (ref 101–111)
CO2: 26 mmol/L (ref 22–32)
CREATININE: 7.45 mg/dL — AB (ref 0.61–1.24)
Calcium: 8 mg/dL — ABNORMAL LOW (ref 8.9–10.3)
GFR calc Af Amer: 9 mL/min — ABNORMAL LOW (ref >60)
GFR calc non Af Amer: 7 mL/min — ABNORMAL LOW (ref >60)
GLUCOSE: 525 mg/dL — AB (ref 65–99)
Potassium: 5.8 mmol/L — ABNORMAL HIGH (ref 3.5–5.1)
SODIUM: 132 mmol/L — AB (ref 135–145)

## 2016-08-29 LAB — TROPONIN I
Troponin I: 0.03 ng/mL (ref <0.03)
Troponin I: 0.04 ng/mL (ref <0.03)

## 2016-08-29 LAB — BRAIN NATRIURETIC PEPTIDE: B Natriuretic Peptide: 1262 pg/mL — ABNORMAL HIGH (ref 0.0–100.0)

## 2016-08-29 LAB — TSH: TSH: 4.693 u[IU]/mL — ABNORMAL HIGH (ref 0.350–4.500)

## 2016-08-29 LAB — MRSA PCR SCREENING: MRSA by PCR: NEGATIVE

## 2016-08-29 MED ORDER — RENA-VITE PO TABS
1.0000 | ORAL_TABLET | Freq: Every day | ORAL | Status: DC
  Administered 2016-08-29: 1 via ORAL
  Filled 2016-08-29: qty 1

## 2016-08-29 MED ORDER — HEPARIN SODIUM (PORCINE) 5000 UNIT/ML IJ SOLN
5000.0000 [IU] | Freq: Three times a day (TID) | INTRAMUSCULAR | Status: DC
  Administered 2016-08-29 – 2016-08-30 (×4): 5000 [IU] via SUBCUTANEOUS
  Filled 2016-08-29 (×4): qty 1

## 2016-08-29 MED ORDER — INSULIN ASPART 100 UNIT/ML ~~LOC~~ SOLN
0.0000 [IU] | Freq: Three times a day (TID) | SUBCUTANEOUS | Status: DC
  Administered 2016-08-29: 10 [IU] via SUBCUTANEOUS
  Filled 2016-08-29: qty 30

## 2016-08-29 MED ORDER — INSULIN ASPART 100 UNIT/ML ~~LOC~~ SOLN
0.0000 [IU] | Freq: Three times a day (TID) | SUBCUTANEOUS | Status: DC
  Administered 2016-08-30: 2 [IU] via SUBCUTANEOUS
  Filled 2016-08-29: qty 2

## 2016-08-29 MED ORDER — ASPIRIN EC 81 MG PO TBEC
81.0000 mg | DELAYED_RELEASE_TABLET | Freq: Every day | ORAL | Status: DC
  Administered 2016-08-29: 81 mg via ORAL
  Filled 2016-08-29: qty 1

## 2016-08-29 MED ORDER — CINACALCET HCL 30 MG PO TABS
30.0000 mg | ORAL_TABLET | Freq: Every day | ORAL | Status: DC
  Administered 2016-08-29: 30 mg via ORAL
  Filled 2016-08-29: qty 1

## 2016-08-29 MED ORDER — ONDANSETRON HCL 4 MG PO TABS: 4.0000 mg | ORAL_TABLET | Freq: Four times a day (QID) | ORAL | Status: DC | PRN

## 2016-08-29 MED ORDER — INSULIN DETEMIR 100 UNIT/ML ~~LOC~~ SOLN
20.0000 [IU] | Freq: Every day | SUBCUTANEOUS | Status: DC
  Administered 2016-08-29: 20 [IU] via SUBCUTANEOUS
  Filled 2016-08-29 (×2): qty 0.2

## 2016-08-29 MED ORDER — ACETAMINOPHEN 325 MG PO TABS
650.0000 mg | ORAL_TABLET | Freq: Four times a day (QID) | ORAL | Status: DC | PRN
  Administered 2016-08-29: 650 mg via ORAL
  Filled 2016-08-29: qty 2

## 2016-08-29 MED ORDER — ORAL CARE MOUTH RINSE: 15.0000 mL | Freq: Two times a day (BID) | OROMUCOSAL | Status: DC

## 2016-08-29 MED ORDER — INSULIN ASPART 100 UNIT/ML ~~LOC~~ SOLN
15.0000 [IU] | Freq: Once | SUBCUTANEOUS | Status: AC
  Administered 2016-08-29: 15 [IU] via SUBCUTANEOUS
  Filled 2016-08-29: qty 15

## 2016-08-29 MED ORDER — INSULIN ASPART 100 UNIT/ML ~~LOC~~ SOLN
20.0000 [IU] | Freq: Three times a day (TID) | SUBCUTANEOUS | Status: DC
  Administered 2016-08-29: 20 [IU] via SUBCUTANEOUS

## 2016-08-29 MED ORDER — DOCUSATE SODIUM 100 MG PO CAPS
100.0000 mg | ORAL_CAPSULE | Freq: Two times a day (BID) | ORAL | Status: DC
  Administered 2016-08-29: 100 mg via ORAL
  Filled 2016-08-29: qty 1

## 2016-08-29 MED ORDER — DULOXETINE HCL 60 MG PO CPEP
120.0000 mg | ORAL_CAPSULE | Freq: Every day | ORAL | Status: DC
  Administered 2016-08-29: 120 mg via ORAL
  Filled 2016-08-29: qty 2

## 2016-08-29 MED ORDER — PANTOPRAZOLE SODIUM 40 MG PO TBEC
40.0000 mg | DELAYED_RELEASE_TABLET | Freq: Every day | ORAL | Status: DC
  Administered 2016-08-29: 40 mg via ORAL
  Filled 2016-08-29: qty 1

## 2016-08-29 MED ORDER — PREDNISONE 5 MG PO TABS
7.5000 mg | ORAL_TABLET | ORAL | Status: DC
  Administered 2016-08-30: 7.5 mg via ORAL
  Filled 2016-08-29: qty 2

## 2016-08-29 MED ORDER — PNEUMOCOCCAL VAC POLYVALENT 25 MCG/0.5ML IJ INJ
0.5000 mL | INJECTION | INTRAMUSCULAR | Status: DC
  Filled 2016-08-29: qty 0.5

## 2016-08-29 MED ORDER — METOPROLOL TARTRATE 50 MG PO TABS
50.0000 mg | ORAL_TABLET | Freq: Two times a day (BID) | ORAL | Status: DC
  Administered 2016-08-29 (×2): 50 mg via ORAL
  Filled 2016-08-29 (×2): qty 1

## 2016-08-29 MED ORDER — INSULIN ASPART 100 UNIT/ML ~~LOC~~ SOLN
10.0000 [IU] | Freq: Three times a day (TID) | SUBCUTANEOUS | Status: DC
  Filled 2016-08-29: qty 10
  Filled 2016-08-29: qty 20

## 2016-08-29 MED ORDER — INSULIN ASPART 100 UNIT/ML ~~LOC~~ SOLN: 15.0000 [IU] | Freq: Three times a day (TID) | SUBCUTANEOUS | Status: DC

## 2016-08-29 MED ORDER — GABAPENTIN 300 MG PO CAPS
300.0000 mg | ORAL_CAPSULE | Freq: Three times a day (TID) | ORAL | Status: DC
  Administered 2016-08-29 (×2): 300 mg via ORAL
  Filled 2016-08-29 (×2): qty 1

## 2016-08-29 MED ORDER — SEVELAMER CARBONATE 800 MG PO TABS
800.0000 mg | ORAL_TABLET | Freq: Three times a day (TID) | ORAL | Status: DC
  Administered 2016-08-29: 800 mg via ORAL
  Filled 2016-08-29: qty 1

## 2016-08-29 MED ORDER — INSULIN DETEMIR 100 UNIT/ML ~~LOC~~ SOLN
40.0000 [IU] | Freq: Every day | SUBCUTANEOUS | Status: DC
  Filled 2016-08-29: qty 0.4

## 2016-08-29 MED ORDER — PREDNISONE 5 MG PO TABS
7.5000 mg | ORAL_TABLET | Freq: Every day | ORAL | Status: DC
  Administered 2016-08-29: 7.5 mg via ORAL
  Filled 2016-08-29: qty 1.5

## 2016-08-29 MED ORDER — ATORVASTATIN CALCIUM 20 MG PO TABS
40.0000 mg | ORAL_TABLET | Freq: Every day | ORAL | Status: DC
  Administered 2016-08-29: 40 mg via ORAL
  Filled 2016-08-29: qty 2

## 2016-08-29 MED ORDER — IRBESARTAN 150 MG PO TABS
75.0000 mg | ORAL_TABLET | Freq: Every day | ORAL | Status: DC
  Administered 2016-08-29: 75 mg via ORAL
  Filled 2016-08-29: qty 1

## 2016-08-29 MED ORDER — ACETAMINOPHEN 650 MG RE SUPP: 650.0000 mg | Freq: Four times a day (QID) | RECTAL | Status: DC | PRN

## 2016-08-29 MED ORDER — FUROSEMIDE 80 MG PO TABS
80.0000 mg | ORAL_TABLET | Freq: Every day | ORAL | Status: DC
  Administered 2016-08-29: 80 mg via ORAL
  Filled 2016-08-29: qty 1

## 2016-08-29 MED ORDER — INSULIN ASPART 100 UNIT/ML ~~LOC~~ SOLN
3.0000 [IU] | Freq: Once | SUBCUTANEOUS | Status: DC
  Filled 2016-08-29: qty 3

## 2016-08-29 MED ORDER — ONDANSETRON HCL 4 MG/2ML IJ SOLN: 4.0000 mg | Freq: Four times a day (QID) | INTRAMUSCULAR | Status: DC | PRN

## 2016-08-29 MED ORDER — INSULIN ASPART 100 UNIT/ML ~~LOC~~ SOLN
30.0000 [IU] | Freq: Three times a day (TID) | SUBCUTANEOUS | Status: DC
  Administered 2016-08-30: 30 [IU] via SUBCUTANEOUS
  Filled 2016-08-29: qty 30

## 2016-08-29 MED ORDER — INSULIN ASPART 100 UNIT/ML ~~LOC~~ SOLN
30.0000 [IU] | Freq: Once | SUBCUTANEOUS | Status: AC
  Administered 2016-08-29: 30 [IU] via SUBCUTANEOUS

## 2016-08-29 NOTE — Progress Notes (Signed)
08/29/2016 5:51 PM Pt CBG remains high at 540 despite insulin given in response to previous CBG of 544.  Discussed plan of care with pt regarding BMP and possible need for insulin drip.  Pt became upset about his blood sugars and management thereof.  Called and spoke with Dr. Nemiah CommanderKalisetti who requested to speak with the pt on the phone to discuss plan of care with him.  Bradly Chrisougherty, Amery Minasyan E, RN

## 2016-08-29 NOTE — Progress Notes (Signed)
HD initiated without issue. MD at bedside  

## 2016-08-29 NOTE — Progress Notes (Signed)
Inpatient Diabetes Program Recommendations  AACE/ADA: New Consensus Statement on Inpatient Glycemic Control (2015)  Target Ranges:  Prepandial:   less than 140 mg/dL      Peak postprandial:   less than 180 mg/dL (1-2 hours)      Critically ill patients:  140 - 180 mg/dL   Lab Results  Component Value Date   GLUCAP 120 (H) 08/29/2016   HGBA1C 6.4 07/31/2016    Review of Glycemic Control  Results for Ryan Arnold, [name] (MRN [id number]) as of 08/29/2016 07:37  Ref. Range 08/29/2016 02:46  Glucose-Capillary Latest Ref Range: 65 - 99 mg/dL 409120 (H)   Diabetes history: Type 2 Outpatient Diabetes medications: Novolog 40 units tid, Tresiba insulin 40 units qday Current orders for Inpatient glycemic control: Levemir 40 units qhs, Novolog 0-9 units tid, * prednisone 7.5mg  qam  Inpatient Diabetes Program Recommendations:  Please consider adding mealtime Novolog insulin 15 units tid (hold if patient eats 50%) and increase correction insulin to resistant correction (based on his weight and steroids)0-20 units tid.  Susette RacerJulie Necha Harries, RN, BA, MHA, CDE Diabetes Coordinator Inpatient Diabetes Program  [phone] (Team Pager) [phone] [facility] Office) 08/29/2016 7:48 AM

## 2016-08-29 NOTE — Care Management (Signed)
Susann Givensheryl Brawner HD liaison faxed admission info

## 2016-08-29 NOTE — Progress Notes (Signed)
[facility]  ROUNDING NOTE   Subjective:   Ryan Arnold admitted on 08/28/2016 for SOB (shortness of breath) [R06.02] Hypoxia [R09.02] Congestive heart failure, unspecified congestive heart failure chronicity, unspecified congestive heart failure type (HCC) [I50.9] Stage 5 chronic kidney disease on chronic dialysis (HCC) [N18.6, Z99.2]   Last dialysis was Wednesday.  Complains of shortness of breath.   Objective:  Vital signs in last 24 hours:  Temp:  [97.5 F (36.4 C)-98.9 F (37.2 C)] 97.6 F (36.4 C) (11/17 0900) Pulse Rate:  [72-78] 76 (11/17 0910) Resp:  [19-26] 19 (11/17 0910) BP: (142-173)/(63-79) 162/74 (11/17 0910) SpO2:  [96 %-100 %] 96 % (11/17 0910) Weight:  [145.1 kg (319 lb 12.8 oz)-146.3 kg (322 lb 8 oz)] 146.3 kg (322 lb 8 oz) (11/17 0900)  Weight change:  Filed Weights   08/28/16 2249 08/29/16 0313 08/29/16 0900  Weight: (!) 145.2 kg (320 lb) (!) 145.1 kg (319 lb 12.8 oz) (!) 146.3 kg (322 lb 8 oz)    Intake/Output: No intake/output data recorded.   Intake/Output this shift:  Total I/O In: 240 [P.O.:240] Out: -   Physical Exam: General: NAD,   Head: Normocephalic, atraumatic. Moist oral mucosal membranes  Eyes: Anicteric, PERRL  Neck: Supple, trachea midline  Lungs:  Clear to auscultation  Heart: Regular rate and rhythm  Abdomen:  Soft, nontender,   Extremities:  trace  peripheral edema.  Neurologic: Nonfocal, moving all four extremities  Skin: No lesions  Access: Left AVF    Basic Metabolic Panel:  Recent Labs Lab 08/28/16 2302  NA 138  K 4.7  CL 97*  CO2 28  GLUCOSE 258*  BUN 56*  CREATININE 9.87*  CALCIUM 7.9*    Liver Function Tests:  Recent Labs Lab 08/28/16 2302  AST 31  ALT 32  ALKPHOS 202*  BILITOT 0.8  PROT 7.9  ALBUMIN 4.0   No results for input(s): LIPASE, AMYLASE in the last 168 hours. No results for input(s): AMMONIA in the last 168 hours.  CBC:  Recent Labs Lab 08/28/16 2302  WBC  12.1*  NEUTROABS 9.2*  HGB 11.4*  HCT 34.6*  MCV 89.7  PLT 354    Cardiac Enzymes:  Recent Labs Lab 08/28/16 2302 08/29/16 0349  TROPONINI 0.03* 0.04*    BNP: Invalid input(s): POCBNP  CBG:  Recent Labs Lab 08/29/16 0246 08/29/16 0745 08/29/16 0828  GLUCAP 120* 52* 78    Microbiology: Results for orders placed or performed during the hospital encounter of 08/28/16  MRSA PCR Screening     Status: None   Collection Time: 08/29/16  3:45 AM  Result Value Ref Range Status   MRSA by PCR NEGATIVE NEGATIVE Final    Comment:        The GeneXpert MRSA Assay (FDA approved for NASAL specimens only), is one component of a comprehensive MRSA colonization surveillance program. It is not intended to diagnose MRSA infection nor to guide or monitor treatment for MRSA infections.     Coagulation Studies: No results for input(s): LABPROT, INR in the last 72 hours.  Urinalysis: No results for input(s): COLORURINE, LABSPEC, PHURINE, GLUCOSEU, HGBUR, BILIRUBINUR, KETONESUR, PROTEINUR, UROBILINOGEN, NITRITE, LEUKOCYTESUR in the last 72 hours.  Invalid input(s): APPERANCEUR    Imaging: Dg Chest 2 View  Result Date: 08/28/2016 CLINICAL DATA:  Worsening exertional dyspnea today. EXAM: CHEST  2 VIEW COMPARISON:  01/22/2016 FINDINGS: Moderate unchanged cardiomegaly. New interstitial thickening and vascular indistinctness. New small right pleural effusion. No focal airspace consolidation. IMPRESSION:  The findings probably represent congestive heart failure. Electronically Signed   By: Ellery Plunkaniel R Mitchell M.D.   On: 08/28/2016 23:47     Medications:    . aspirin EC  81 mg Oral Daily  . atorvastatin  40 mg Oral Daily  . cinacalcet  30 mg Oral QHS  . docusate sodium  100 mg Oral BID  . DULoxetine  120 mg Oral Daily  . furosemide  80 mg Oral Daily  . gabapentin  300 mg Oral TID  . heparin  5,000 Units Subcutaneous Q8H  . insulin aspart  0-9 Units Subcutaneous TID WC  .  insulin aspart  10 Units Subcutaneous TID WC  . insulin detemir  40 Units Subcutaneous QHS  . irbesartan  75 mg Oral Daily  . mouth rinse  15 mL Mouth Rinse BID  . metoprolol  50 mg Oral BID  . multivitamin  1 tablet Oral Daily  . pantoprazole  40 mg Oral Daily  . [START ON 08/30/2016] pneumococcal 23 valent vaccine  0.5 mL Intramuscular Tomorrow-1000  . predniSONE  7.5 mg Oral Q breakfast  . sevelamer carbonate  800 mg Oral TID   acetaminophen **OR** acetaminophen, ondansetron **OR** ondansetron (ZOFRAN) IV  Assessment/ Plan:  [name] is a 55 y.o. black male  Diabetes mellitus type 1, hypertension, hyperlipidemia, gout, CHF, hypothyroidism, Vitamin D deficiency, peripheral neuropathy, diabetic retinopathy and obstructive sleep apnea on CPAP, anemia of CKD, SHPTH, BOOP 4/16. First dialysis 10/2012  MWF [facility] [address]   1. End Stage Renal Disease: MWF AVF Estimated dry weight 144.5 kg Dialysis today for shortness of breath and pulmonary edema Continue MWF schedule  2. Hypertension: elevated.  - Ultrafiltration with dialysis.  - furosemide, metoprolol and irbesartan - home regimen also includes amlodipine  3. Anemia of chronic kidney disease: hemoglobin 11.4 - hold epo  4. Secondary Hyperparathyroidism: with hypocalcemia: Outpatient phosphorus and PTH at goal - [place] sevelamer with meals. - hold cinacalcet due to hypocalcemia     LOS: 0 Marelly Wehrman 11/17/20179:24 AM

## 2016-08-29 NOTE — Progress Notes (Signed)
Dr. Sheryle Hailiamond notified of critical Troponin of 0.04; acknowledged; Windy Carinaurner,Jeriann Sayres K, RN 5:32 AM 08/29/2016

## 2016-08-29 NOTE — Progress Notes (Signed)
08/29/2016 5:13 PM  Pt evening CBG measured at 544.  Notified attending MD Nemiah CommanderKalisetti, who ordered me to give 30 units Novolog.  Will recheck CBG at 17:25 and report results to MD to await further instructions.  Bradly Chrisougherty, Semaya Vida E, RN

## 2016-08-29 NOTE — Progress Notes (Signed)
Pre-Dialysis assessment. 

## 2016-08-29 NOTE — Progress Notes (Signed)
08/29/2016 6:31 PM  Lab technician informed me pt's blood glucose from lab draw was 525.  Notified prime doc Juliene PinaMody and explained the situation.  Will reassess CBG as planned at 1900 and follow MD orders from there.  Bradly Chrisougherty, Ahron Hulbert E, RN

## 2016-08-29 NOTE — Progress Notes (Addendum)
[facility]   PATIENT NAME: Ryan Arnold    MR#:  [id number]  DATE OF BIRTH:  05/16/1961  SUBJECTIVE:  CHIEF COMPLAINT:   Chief Complaint  Patient presents with  . Shortness of Breath   - Patient with history of COPD and BOOP, dialysis admitted with shortness of breath and hypoxia and noted to have pulmonary edema. -Seen after dialysis today, down to 3 L oxygen and feels much better  -sugars are elevated  REVIEW OF SYSTEMS:  Review of Systems  Constitutional: Negative for chills, fever and malaise/fatigue.  HENT: Negative for ear discharge, ear pain, hearing loss and nosebleeds.   Eyes: Negative for blurred vision and double vision.  Respiratory: Positive for shortness of breath. Negative for cough and wheezing.   Cardiovascular: Negative for chest pain, palpitations and leg swelling.  Gastrointestinal: Negative for abdominal pain, constipation, diarrhea, nausea and vomiting.  Genitourinary: Negative for dysuria and urgency.  Neurological: Negative for dizziness, seizures and headaches.  Psychiatric/Behavioral: Negative for depression.    DRUG ALLERGIES:   Allergies  Allergen Reactions  . Oxycodone-Acetaminophen Nausea And Vomiting  . Tape Other (See Comments)    Paper tape caused blisters    VITALS:  Blood pressure (!) 176/79, pulse 79, temperature 97.7 F (36.5 C), temperature source Oral, resp. rate 17, height 6\' 1"  (1.854 m), weight (!) 142.6 kg (314 lb 6 oz), SpO2 95 %.  PHYSICAL EXAMINATION:  Physical Exam  GENERAL:  55 y.o.-year-old Obese patient lying in the bed with no acute distress.  EYES: Pupils equal, round, reactive to light and accommodation. No scleral icterus. Extraocular muscles intact.  HEENT: Head atraumatic, normocephalic. Oropharynx and nasopharynx clear.  NECK:  Supple, no jugular venous distention. No thyroid enlargement, no tenderness.  LUNGS: Normal breath sounds bilaterally, no wheezing,  rales,rhonchi or crepitation. No use of accessory muscles of respiration. Diminished bibasilar breath sounds. CARDIOVASCULAR: S1, S2 normal. No murmurs, rubs, or gallops.  ABDOMEN: Soft, nontender, nondistended. Bowel sounds present. No organomegaly or mass.  EXTREMITIES: No pedal edema, cyanosis, or clubbing.  NEUROLOGIC: Cranial nerves II through XII are intact. Muscle strength 5/5 in all extremities. Sensation intact. Gait not checked.  PSYCHIATRIC: The patient is alert and oriented x 3.  SKIN: No obvious rash, lesion, or ulcer.    LABORATORY PANEL:   CBC  Recent Labs Lab 08/28/16 2302  WBC 12.1*  HGB 11.4*  HCT 34.6*  PLT 354   ------------------------------------------------------------------------------------------------------------------  Chemistries   Recent Labs Lab 08/28/16 2302  NA 138  K 4.7  CL 97*  CO2 28  GLUCOSE 258*  BUN 56*  CREATININE 9.87*  CALCIUM 7.9*  AST 31  ALT 32  ALKPHOS 202*  BILITOT 0.8   ------------------------------------------------------------------------------------------------------------------  Cardiac Enzymes  Recent Labs Lab 08/29/16 0349  TROPONINI 0.04*   ------------------------------------------------------------------------------------------------------------------  RADIOLOGY:  Dg Chest 2 View  Result Date: 08/28/2016 CLINICAL DATA:  Worsening exertional dyspnea today. EXAM: CHEST  2 VIEW COMPARISON:  01/22/2016 FINDINGS: Moderate unchanged cardiomegaly. New interstitial thickening and vascular indistinctness. New small right pleural effusion. No focal airspace consolidation. IMPRESSION: The findings probably represent congestive heart failure. Electronically Signed   By: Ellery Plunkaniel R Mitchell M.D.   On: 08/28/2016 23:47    EKG:   Orders placed or performed during the hospital encounter of 08/28/16  . ED EKG  . ED EKG  . ED EKG  . ED EKG  . EKG 12-Lead  . EKG 12-Lead    ASSESSMENT AND PLAN:  55 year old  obese male with past medical history significant for end-stage renal disease on Monday, Wednesday, Friday hemodialysis, history of BOOP, chronic diastolic CHF, hypertension, insulin-dependent diabetes mellitus Presents to hospital secondary to difficulty breathing and noted to be hypoxic  #1 acute hypoxic respiratory failure-secondary to acute diastolic CHF exacerbation and pulmonary edema -Had dialysis done today. Not on home oxygen, started on 5 L in the emergency room and improved down to 3 L now. -Continue to wean on oxygen. -continue CPAP at bedtime  #2 DM with hyperglycemia,- last a1c 6.4 last month - on Tresiba at home- changed to levemir now and also on premeal novolog and ssi - if BMP shows elevated sugars again- start insulin drip for hyperglycemia  #3 ESRD on hemodialysis-Monday Wednesday Friday schedule. Appreciate nephrology consult. Dialysis today. -Assess  tomorrow for need for repeat dialysis  #4 hypertension-continue home medications.  #5 BOOP-follows with pulmonary as outpatient. On prednisone taper. Currently on 0.5 Mg Daily.  #6 DVT Prophylaxis-Subcutaneous Heparin     All the records are reviewed and case discussed with Care Management/Social Workerr. Management plans discussed with the patient, family and they are in agreement.  CODE STATUS:Full code  TOTAL TIME TAKING CARE OF THIS PATIENT: 37 minutes.   POSSIBLE D/C IN 1-2 DAYS, DEPENDING ON CLINICAL CONDITION.   Enid BaasKALISETTI,Issaih Kaus M.D on 08/29/2016 at 5:32 PM  Between 7am to 6pm - Pager - [phone]  After 6pm go to [email or web address] - Social research officer, governmentpassword EPAS [facility]  [facility]  Office  [phone]  CC: Primary care physician; Tommie SamsJayce G Cook, DO

## 2016-08-29 NOTE — Progress Notes (Signed)
Pre Dialysis 

## 2016-08-29 NOTE — Progress Notes (Signed)
Post HD assessment  

## 2016-08-29 NOTE — H&P (Signed)
Ryan Arnold is an 55 y.o. male.   Chief Complaint: Shortness of breath HPI: The patient with past medical history of end-stage renal disease on dialysis as well as chronic diastolic heart failure presents emergency department complaining of shortness of breath. The patient was using his CPAP tonight without his oxygen as he has been trying to wean from supplemental O2 when he became acutely short of breath. He increase his inhaled oxygen 6 L/m with mild relief of symptoms. When he arrived in the emergency department he was tachypneic but oxygen saturations improved. Chest x-ray showed changes consistent with congestive heart failure as well as mild effusions. Supplemental oxygen was weaned some but the patient was still tachypneic which prompted emergency department staff to call. Hospitalist service for observation.  Past Medical History:  Diagnosis Date  . Anemia of chronic disease   . BOOP (bronchiolitis obliterans with organizing pneumonia) ([place])    a. 01/2018 s/p R thoracoscopy/Bx confirming BOOP;  b. 02/2018 high dose steroids started.  . Chest pain    a. 06/2014 Myoview ([facility]) no ischemia/infarct, nl EF.  [name] Chronic diastolic CHF (congestive heart failure) ([place])    a. 12/2014 Echo: EF 50-55%, mild conc LVH, mildly dil LA.  . End stage renal disease ([place])    a. on dialysis; still makes urine.  . Essential hypertension   . Hyperlipemia   . Morbid obesity ([place])   . OSA treated with BiPAP   . Recurrent pneumonia    a. 2/2 BOOP.  [name] Secondary hyperparathyroidism ([place])   . Thyroid disease   . Type 1 diabetes mellitus ([place])     Past Surgical History:  Procedure Laterality Date  . cataract Bilateral   . GALLBLADDER SURGERY    . KNEE SURGERY Right   . LUNG BIOPSY    . SHOULDER SURGERY Left     Family History  Problem Relation Age of Onset  . Diabetes Mother     died @ 35.  [name] Hypertension Mother   . Hypertension Maternal Grandmother    Social History:  reports that he has never  smoked. He has never used smokeless tobacco. He reports that he does not drink alcohol or use drugs.  Allergies:  Allergies  Allergen Reactions  . Oxycodone-Acetaminophen Nausea And Vomiting  . Tape Other (See Comments)    Paper tape caused blisters    Medications Prior to Admission  Medication Sig Dispense Refill  . albuterol (PROVENTIL HFA) 108 (90 Base) MCG/ACT inhaler Inhale 2 puffs into the lungs every 6 (six) hours as needed for wheezing or shortness of breath. 1 Inhaler 0  . aspirin EC 81 MG tablet Take 81 mg by mouth daily.    [name] atorvastatin (LIPITOR) 40 MG tablet Take 40 mg by mouth daily.    . B-D ULTRAFINE III SHORT PEN 31G X 8 MM MISC use as directed 100 each 11  . DULoxetine (CYMBALTA) 60 MG capsule Take 2 capsules (120 mg total) by mouth daily. 90 capsule 2  . furosemide (LASIX) 80 MG tablet take 1 tablet by mouth 4 TIMES  A WEEK ,TUESDAY ,THURSDAY ,SATURDAY AND SUNDAY 36 tablet 3  . gabapentin (NEURONTIN) 300 MG capsule Take 1 capsule (300 mg total) by mouth 3 (three) times daily. 90 capsule 3  . insulin aspart (NOVOLOG FLEXPEN) 100 UNIT/ML FlexPen Inject 40 Units into the skin 3 (three) times daily with meals.    . metoprolol (LOPRESSOR) 50 MG tablet Take 50 mg by mouth 2 (two) times daily.    [name]  multivitamin (RENA-VIT) TABS tablet Take 1 tablet by mouth daily.    . ONE TOUCH ULTRA TEST test strip TEST BLOOD SUGAR four times a day 100 each 3  . pantoprazole (PROTONIX) 40 MG tablet take 1 tablet by mouth once daily 90 tablet 3  . predniSONE (DELTASONE) 5 MG tablet Take 7.5 mg by mouth daily with breakfast. Takes on Tuesday/ Thursday/ Saturday    . SENSIPAR 30 MG tablet Take 30 mg by mouth at bedtime. Reported on 04/29/2016    . sevelamer carbonate (RENVELA) 800 MG tablet Take 800 mg by mouth 3 (three) times daily. And 2 tablet with snacks    . TRESIBA FLEXTOUCH 100 UNIT/ML SOPN FlexTouch Pen inject 40 units subcutaneously once daily 15 mL 2  . valsartan (DIOVAN) 80 MG tablet  Take 80 mg by mouth at bedtime.   0    Results for orders placed or performed during the hospital encounter of 08/28/16 (from the past 48 hour(s))  Comprehensive metabolic panel     Status: Abnormal   Collection Time: 08/28/16 11:02 PM  Result Value Ref Range   Sodium 138 135 - 145 mmol/L   Potassium 4.7 3.5 - 5.1 mmol/L   Chloride 97 (L) 101 - 111 mmol/L   CO2 28 22 - 32 mmol/L   Glucose, Bld 258 (H) 65 - 99 mg/dL   BUN 56 (H) 6 - 20 mg/dL   Creatinine, Ser 9.87 (H) 0.61 - 1.24 mg/dL   Calcium 7.9 (L) 8.9 - 10.3 mg/dL   Total Protein 7.9 6.5 - 8.1 g/dL   Albumin 4.0 3.5 - 5.0 g/dL   AST 31 15 - 41 U/L   ALT 32 17 - 63 U/L   Alkaline Phosphatase 202 (H) 38 - 126 U/L   Total Bilirubin 0.8 0.3 - 1.2 mg/dL   GFR calc non Af Amer 5 (L) >60 mL/min   GFR calc Af Amer 6 (L) >60 mL/min    Comment: (NOTE) The eGFR has been calculated using the CKD EPI equation. This calculation has not been validated in all clinical situations. eGFR's persistently <60 mL/min signify possible Chronic Kidney Disease.    Anion gap 13 5 - 15  CBC with Differential     Status: Abnormal   Collection Time: 08/28/16 11:02 PM  Result Value Ref Range   WBC 12.1 (H) 3.8 - 10.6 K/uL   RBC 3.86 (L) 4.40 - 5.90 MIL/uL   Hemoglobin 11.4 (L) 13.0 - 18.0 g/dL   HCT 34.6 (L) 40.0 - 52.0 %   MCV 89.7 80.0 - 100.0 fL   MCH 29.5 26.0 - 34.0 pg   MCHC 32.9 32.0 - 36.0 g/dL   RDW 17.3 (H) 11.5 - 14.5 %   Platelets 354 150 - 440 K/uL   Neutrophils Relative % 75 %   Neutro Abs 9.2 (H) 1.4 - 6.5 K/uL   Lymphocytes Relative 17 %   Lymphs Abs 2.0 1.0 - 3.6 K/uL   Monocytes Relative 6 %   Monocytes Absolute 0.7 0.2 - 1.0 K/uL   Eosinophils Relative 1 %   Eosinophils Absolute 0.2 0 - 0.7 K/uL   Basophils Relative 1 %   Basophils Absolute 0.1 0 - 0.1 K/uL  Troponin I     Status: Abnormal   Collection Time: 08/28/16 11:02 PM  Result Value Ref Range   Troponin I 0.03 (HH) <0.03 ng/mL    Comment: CRITICAL RESULT CALLED  TO, READ BACK BY AND VERIFIED WITH KAILEY WALKER ON 08/29/16  AT 0011 BY TLB   Brain natriuretic peptide     Status: Abnormal   Collection Time: 08/28/16 11:02 PM  Result Value Ref Range   B Natriuretic Peptide 1,262.0 (H) 0.0 - 100.0 pg/mL  Glucose, capillary     Status: Abnormal   Collection Time: 08/29/16  2:46 AM  Result Value Ref Range   Glucose-Capillary 120 (H) 65 - 99 mg/dL  MRSA PCR Screening     Status: None   Collection Time: 08/29/16  3:45 AM  Result Value Ref Range   MRSA by PCR NEGATIVE NEGATIVE    Comment:        The GeneXpert MRSA Assay (FDA approved for NASAL specimens only), is one component of a comprehensive MRSA colonization surveillance program. It is not intended to diagnose MRSA infection nor to guide or monitor treatment for MRSA infections.   Troponin I     Status: Abnormal   Collection Time: 08/29/16  3:49 AM  Result Value Ref Range   Troponin I 0.04 (HH) <0.03 ng/mL    Comment: CRITICAL RESULT CALLED TO, READ BACK BY AND VERIFIED WITH MARCELLA TURNER ON 08/29/16 AT 0528 BY TLB   TSH     Status: Abnormal   Collection Time: 08/29/16  3:49 AM  Result Value Ref Range   TSH 4.693 (H) 0.350 - 4.500 uIU/mL    Comment: Performed by a 3rd Generation assay with a functional sensitivity of <=0.01 uIU/mL.   Dg Chest 2 View  Result Date: 08/28/2016 CLINICAL DATA:  Worsening exertional dyspnea today. EXAM: CHEST  2 VIEW COMPARISON:  01/22/2016 FINDINGS: Moderate unchanged cardiomegaly. New interstitial thickening and vascular indistinctness. New small right pleural effusion. No focal airspace consolidation. IMPRESSION: The findings probably represent congestive heart failure. Electronically Signed   By: Andreas Newport M.D.   On: 08/28/2016 23:47    Review of Systems  Constitutional: Negative for chills and fever.  HENT: Negative for sore throat and tinnitus.   Eyes: Negative for blurred vision and redness.  Respiratory: Positive for shortness of  breath. Negative for cough.   Cardiovascular: Negative for chest pain, palpitations, orthopnea and PND.  Gastrointestinal: Negative for abdominal pain, diarrhea, nausea and vomiting.  Genitourinary: Negative for dysuria, frequency and urgency.  Musculoskeletal: Negative for joint pain and myalgias.  Skin: Negative for rash.       No lesions  Neurological: Positive for headaches. Negative for speech change, focal weakness and weakness.  Endo/Heme/Allergies: Does not bruise/bleed easily.       No temperature intolerance  Psychiatric/Behavioral: Negative for depression and suicidal ideas.    Blood pressure (!) 164/75, pulse 77, temperature 97.5 F (36.4 C), temperature source Oral, resp. rate (!) 24, height 6' 1"  (1.854 m), weight (!) 145.1 kg (319 lb 12.8 oz), SpO2 97 %. Physical Exam  Constitutional: He is oriented to person, place, and time. He appears well-developed and well-nourished. No distress.  HENT:  Head: Normocephalic and atraumatic.  Mouth/Throat: Oropharynx is clear and moist.  Eyes: Conjunctivae and EOM are normal. Pupils are equal, round, and reactive to light. No scleral icterus.  Neck: Normal range of motion. Neck supple. No JVD present. No tracheal deviation present. No thyromegaly present.  Cardiovascular: Normal rate, regular rhythm and normal heart sounds.  Exam reveals no gallop and no friction rub.   No murmur heard. Respiratory: Tachypnea noted. He has decreased breath sounds in the right lower field. He has rales in the right middle field.  O2 by nasal cannula 3L  GI:  Soft. Bowel sounds are normal. He exhibits no distension. There is no tenderness.  Genitourinary:  Genitourinary Comments: Deferred  Musculoskeletal: Normal range of motion. He exhibits edema.  Lymphadenopathy:    He has no cervical adenopathy.  Neurological: He is alert and oriented to person, place, and time.  Skin: Skin is warm and dry. No rash noted. No erythema.  Psychiatric: He has a normal  mood and affect. His behavior is normal. Judgment and thought content normal.     Assessment/Plan This is a 55 year old male admitted for respiratory distress. 1. Respiratory distress: Multifactorial; secondary to end-stage renal disease as well as diastolic heart failure. He is much more comfortable at this time and we will continue to supplement oxygen as needed. He will likely return to baseline with dialysis this morning. He does not make much urine so diuresis will not be helpful at this time. Continue CPAP daily at bedtime per home regimen. Supplemental oxygen decreased from 6 L/m down to 3 L/m via nasal cannula. 2. ESRD: On hemodialysis via left upper arm fistula; continue Monday Wednesday Friday treatments. Nephrology consulted. Continue phosphate binders and calcium sensitizers. 3. CHF: chronic; diastolic. Stable. Continue metoprolol 4. Hypertension: Intermittent controlled; continue ARB 5. Diabetes mellitus with neurologic complications: Continue long-acting insulin as well as sliding scale. Continue gabapentin and Cymbalta for neuropathy. 6. DVT prophylaxis: Heparin 7. GI prophylaxis: None The patient is a full code. Time spent on admission orders and patient care approximately 45 minutes  Harrie Foreman, MD 08/29/2016, 6:24 AM

## 2016-08-29 NOTE — Progress Notes (Signed)
Pt placed on [facility] CPAP C-1. CPAP plugged into red outlet. 2L O2 in line. Pt tolerating well.

## 2016-08-29 NOTE — Progress Notes (Signed)
08/29/2016 7:29 PM  Spoke with on-call physician Dr. Laural BenesJohnson about pt's CBG 426 on recheck.  He is ordering additional 30 units Novolog and recheck of CBG 90 minutes later.  Will pass on to night shift nurse.  Bradly Chrisougherty, Assad Harbeson E, RN

## 2016-08-29 NOTE — Care Management Obs Status (Signed)
MEDICARE OBSERVATION STATUS NOTIFICATION   Patient Details  Name: Ryan FellsMyron W Bingaman MRN: [id number] Date of Birth: 11/15/1960   Medicare Observation Status Notification Given:  Yes    Chapman FitchBOWEN, Vinicius Brockman T, RN 08/29/2016, 4:14 PM

## 2016-08-29 NOTE — Progress Notes (Signed)
HD completed without issue. Unable to meet goal d/t cramping. Pt chose to end treatment 30 minutes early.

## 2016-08-30 LAB — BASIC METABOLIC PANEL
ANION GAP: 13 (ref 5–15)
BUN: 46 mg/dL — ABNORMAL HIGH (ref 6–20)
CHLORIDE: 97 mmol/L — AB (ref 101–111)
CO2: 28 mmol/L (ref 22–32)
Calcium: 7.8 mg/dL — ABNORMAL LOW (ref 8.9–10.3)
Creatinine, Ser: 7.93 mg/dL — ABNORMAL HIGH (ref 0.61–1.24)
GFR calc non Af Amer: 7 mL/min — ABNORMAL LOW (ref >60)
GFR, EST AFRICAN AMERICAN: 8 mL/min — AB (ref >60)
Glucose, Bld: 152 mg/dL — ABNORMAL HIGH (ref 65–99)
POTASSIUM: 4.6 mmol/L (ref 3.5–5.1)
Sodium: 138 mmol/L (ref 135–145)

## 2016-08-30 LAB — GLUCOSE, CAPILLARY
Glucose-Capillary: 56 mg/dL — ABNORMAL LOW (ref 65–99)
Glucose-Capillary: 90 mg/dL (ref 65–99)
Glucose-Capillary: 156 mg/dL — ABNORMAL HIGH (ref 65–99)

## 2016-08-30 LAB — HEPATITIS B SURFACE ANTIBODY,QUALITATIVE: Hep B S Ab: REACTIVE

## 2016-08-30 LAB — HEPATITIS B CORE ANTIBODY, TOTAL: Hep B Core Total Ab: NEGATIVE

## 2016-08-30 LAB — HEPATITIS B SURFACE ANTIGEN: Hepatitis B Surface Ag: NEGATIVE

## 2016-08-30 MED ORDER — FUROSEMIDE 80 MG PO TABS: 80.0000 mg | ORAL_TABLET | Freq: Every day | ORAL | 2 refills | Status: DC

## 2016-08-30 MED ORDER — GLUCOSE 4 G PO CHEW
CHEWABLE_TABLET | ORAL | Status: AC
  Administered 2016-08-30: 03:00:00
  Filled 2016-08-30: qty 1

## 2016-08-30 NOTE — Discharge Summary (Signed)
[facility] [place] at [facility]   PATIENT NAME: Ryan OxfordMyron Arnold    MR#:  [id number]  DATE OF BIRTH:  12/30/1960  DATE OF ADMISSION:  08/28/2016   ADMITTING PHYSICIAN: Arnaldo NatalMichael S Diamond, MD  DATE OF DISCHARGE: 08/30/2016  PRIMARY CARE PHYSICIAN: Tommie SamsJayce G Cook, DO   ADMISSION DIAGNOSIS:   SOB (shortness of breath) [R06.02] Hypoxia [R09.02] Congestive heart failure, unspecified congestive heart failure chronicity, unspecified congestive heart failure type (HCC) [I50.9] Stage 5 chronic kidney disease on chronic dialysis (HCC) [N18.6, Z99.2]  DISCHARGE DIAGNOSIS:   Active Problems:   SOB (shortness of breath)   SECONDARY DIAGNOSIS:   Past Medical History:  Diagnosis Date  . Anemia of chronic disease   . BOOP (bronchiolitis obliterans with organizing pneumonia) (HCC)    a. 01/2018 s/p R thoracoscopy/Bx confirming BOOP;  b. 02/2018 high dose steroids started.  . Chest pain    a. 06/2014 Myoview ([facility]) no ischemia/infarct, nl EF.  [name]. Chronic diastolic CHF (congestive heart failure) (HCC)    a. 12/2014 Echo: EF 50-55%, mild conc LVH, mildly dil LA.  . End stage renal disease (HCC)    a. on dialysis; still makes urine.  . Essential hypertension   . Hyperlipemia   . Morbid obesity (HCC)   . OSA treated with BiPAP   . Recurrent pneumonia    a. 2/2 BOOP.  [name]. Secondary hyperparathyroidism (HCC)   . Thyroid disease   . Type 1 diabetes mellitus [facility](HCC)     HOSPITAL COURSE:   55 year old obese male with past medical history significant for end-stage renal disease on Monday, Wednesday, Friday hemodialysis, history of BOOP, chronic diastolic CHF, hypertension, insulin-dependent diabetes mellitus Presents to hospital secondary to difficulty breathing and noted to be hypoxic  #1 Acute hypoxic respiratory failure-secondary to acute diastolic CHF exacerbation and pulmonary edema -Had dialysis done yesterday per schedule. Not on home oxygen, started on 5 L in the  emergency room and improved. Now off of oxygen -continue CPAP at bedtime  #2 DM with hyperglycemia,- last a1c 6.4 last month - on Tresiba at home and also takes high doses of NovoLog pre-meals. -Sugars have been increased and low yesterday due to changes in his schedule. Much better this morning.  #3 ESRD on hemodialysis-Monday Wednesday Friday schedule. Appreciate nephrology consult. Received dialysis yesterday. Next dialysis tomorrow per holiday schedule this week.  #4 hypertension-continue home medications.  #5 BOOP-follows with pulmonary as outpatient. On prednisone taper. Currently on 7.5 Mg Daily.  Patient has been able to ambulate without any deficits. Will be discharged home.   DISCHARGE CONDITIONS:   Stable CONSULTS OBTAINED:   Treatment Team:  Lamont DowdySarath Kolluru, MD  DRUG ALLERGIES:   Allergies  Allergen Reactions  . Oxycodone-Acetaminophen Nausea And Vomiting  . Tape Other (See Comments)    Paper tape caused blisters   DISCHARGE MEDICATIONS:     Medication List    TAKE these medications   albuterol 108 (90 Base) MCG/ACT inhaler Commonly known as:  PROVENTIL HFA Inhale 2 puffs into the lungs every 6 (six) hours as needed for wheezing or shortness of breath.   aspirin EC 81 MG tablet Take 81 mg by mouth daily.   atorvastatin 40 MG tablet Commonly known as:  LIPITOR Take 40 mg by mouth daily.   B-D ULTRAFINE III SHORT PEN 31G X 8 MM Misc Generic drug:  Insulin Pen Needle use as directed   DULoxetine 60 MG capsule Commonly known as:  CYMBALTA Take 2 capsules (120  mg total) by mouth daily.   furosemide 80 MG tablet Commonly known as:  LASIX Take 1 tablet (80 mg total) by mouth daily.   gabapentin 300 MG capsule Commonly known as:  NEURONTIN Take 1 capsule (300 mg total) by mouth 3 (three) times daily.   metoprolol 50 MG tablet Commonly known as:  LOPRESSOR Take 50 mg by mouth 2 (two) times daily.   multivitamin Tabs tablet Take 1 tablet by  mouth daily.   NOVOLOG FLEXPEN 100 UNIT/ML FlexPen Generic drug:  insulin aspart Inject 40 Units into the skin 3 (three) times daily with meals.   ONE TOUCH ULTRA TEST test strip Generic drug:  glucose blood TEST BLOOD SUGAR four times a day   pantoprazole 40 MG tablet Commonly known as:  PROTONIX take 1 tablet by mouth once daily   predniSONE 5 MG tablet Commonly known as:  DELTASONE Take 7.5 mg by mouth daily with breakfast. Takes on Tuesday/ Thursday/ Saturday   SENSIPAR 30 MG tablet Generic drug:  cinacalcet Take 30 mg by mouth at bedtime. Reported on 04/29/2016   sevelamer carbonate 800 MG tablet Commonly known as:  RENVELA Take 800 mg by mouth 3 (three) times daily. And 2 tablet with snacks   TRESIBA FLEXTOUCH 100 UNIT/ML Sopn FlexTouch Pen Generic drug:  insulin degludec inject 40 units subcutaneously once daily   valsartan 80 MG tablet Commonly known as:  DIOVAN Take 80 mg by mouth at bedtime.        DISCHARGE INSTRUCTIONS:   1. PCP f/u in 1-2 weeks 2. For dialysis tomorrow per holiday schedule  DIET:   Renal diet  ACTIVITY:   Activity as tolerated  OXYGEN:   Home Oxygen: No.  Oxygen Delivery: room air  DISCHARGE LOCATION:   home   If you experience worsening of your admission symptoms, develop shortness of breath, life threatening emergency, suicidal or homicidal thoughts you must seek medical attention immediately by calling 911 or calling your MD immediately  if symptoms less severe.  You Must read complete instructions/literature along with all the possible adverse reactions/side effects for all the Medicines you take and that have been prescribed to you. Take any new Medicines after you have completely understood and accpet all the possible adverse reactions/side effects.   Please note  You were cared for by a hospitalist during your hospital stay. If you have any questions about your discharge medications or the care you received while  you were in the hospital after you are discharged, you can call the unit and asked to speak with the hospitalist on call if the hospitalist that took care of you is not available. Once you are discharged, your primary care physician will handle any further medical issues. Please note that NO REFILLS for any discharge medications will be authorized once you are discharged, as it is imperative that you return to your primary care physician (or establish a relationship with a primary care physician if you do not have one) for your aftercare needs so that they can reassess your need for medications and monitor your lab values.    On the day of Discharge:  VITAL SIGNS:   Blood pressure (!) 173/80, pulse 71, temperature 98.2 F (36.8 C), temperature source Oral, resp. rate 20, height 6\' 1"  (1.854 m), weight (!) 141.9 kg (312 lb 12.8 oz), SpO2 93 %.  PHYSICAL EXAMINATION:    GENERAL:  55 y.o.-year-old Obese patient lying in the bed with no acute distress.  EYES: Pupils equal,  round, reactive to light and accommodation. No scleral icterus. Extraocular muscles intact.  HEENT: Head atraumatic, normocephalic. Oropharynx and nasopharynx clear.  NECK:  Supple, no jugular venous distention. No thyroid enlargement, no tenderness.  LUNGS: Normal breath sounds bilaterally, no wheezing, rales,rhonchi or crepitation. No use of accessory muscles of respiration. Diminished bibasilar breath sounds. CARDIOVASCULAR: S1, S2 normal. No murmurs, rubs, or gallops.  ABDOMEN: Soft, nontender, nondistended. Bowel sounds present. No organomegaly or mass.  EXTREMITIES: No pedal edema, cyanosis, or clubbing.  NEUROLOGIC: Cranial nerves II through XII are intact. Muscle strength 5/5 in all extremities. Sensation intact. Gait not checked.  PSYCHIATRIC: The patient is alert and oriented x 3.  SKIN: No obvious rash, lesion, or ulcer.   DATA REVIEW:   CBC  Recent Labs Lab 08/28/16 2302  WBC 12.1*  HGB 11.4*  HCT 34.6*    PLT 354    Chemistries   Recent Labs Lab 08/28/16 2302  08/30/16 0548  NA 138  < > 138  K 4.7  < > 4.6  CL 97*  < > 97*  CO2 28  < > 28  GLUCOSE 258*  < > 152*  BUN 56*  < > 46*  CREATININE 9.87*  < > 7.93*  CALCIUM 7.9*  < > 7.8*  AST 31  --   --   ALT 32  --   --   ALKPHOS 202*  --   --   BILITOT 0.8  --   --   < > = values in this interval not displayed.   Microbiology Results  Results for orders placed or performed during the hospital encounter of 08/28/16  MRSA PCR Screening     Status: None   Collection Time: 08/29/16  3:45 AM  Result Value Ref Range Status   MRSA by PCR NEGATIVE NEGATIVE Final    Comment:        The GeneXpert MRSA Assay (FDA approved for NASAL specimens only), is one component of a comprehensive MRSA colonization surveillance program. It is not intended to diagnose MRSA infection nor to guide or monitor treatment for MRSA infections.     RADIOLOGY:  No results found.   Management plans discussed with the patient, family and they are in agreement.  CODE STATUS:     Code Status Orders        Start     Ordered   08/29/16 0310  Full code  Continuous     08/29/16 0309    Code Status History    Date Active Date Inactive Code Status Order ID Comments User Context   03/18/2015  8:11 PM 03/21/2015  6:23 PM Full Code [id number]  Adrian SaranSital Mody, MD Inpatient      TOTAL TIME TAKING CARE OF THIS PATIENT: 38 minutes.    Enid BaasKALISETTI,Ily Denno M.D on 08/30/2016 at 8:48 AM  Between 7am to 6pm - Pager - [phone]  After 6pm go to [email or web address] - Social research officer, governmentpassword [email or web address] [email or web address]  [facility] [place] [facility]  Office  [phone]  CC: Primary care physician; Tommie SamsJayce G Cook, DO   Note: This dictation was prepared with Dragon dictation along with smaller phrase technology. Any transcriptional errors that result from this process are unintentional.

## 2016-08-30 NOTE — Progress Notes (Signed)
Hypoglycemic Event  CBG: 56    Treatment: 3 glucose tabs  Symptoms: None  Follow-up CBG: Time:0300 CBG Result:90  Possible Reasons for Event: Medication regimen: Insulin adjusted d/t hyperglycemia  Comments/MD notified:    Marcene CorningWilliams, Elon Eoff [place]ah

## 2016-08-30 NOTE — Progress Notes (Signed)
Alert and oriented. Vital signs stable . No signs of acute distress. Discharge instructions given. Patient verbalized understanding. No other issues noted at this time.    

## 2016-08-30 NOTE — Progress Notes (Signed)
Patient off of CPAP at this time. 

## 2016-08-31 DIAGNOSIS — N186 End stage renal disease: Secondary | ICD-10-CM | POA: Diagnosis not present

## 2016-08-31 DIAGNOSIS — D631 Anemia in chronic kidney disease: Secondary | ICD-10-CM | POA: Diagnosis not present

## 2016-08-31 DIAGNOSIS — D509 Iron deficiency anemia, unspecified: Secondary | ICD-10-CM | POA: Diagnosis not present

## 2016-08-31 DIAGNOSIS — N2581 Secondary hyperparathyroidism of renal origin: Secondary | ICD-10-CM | POA: Diagnosis not present

## 2016-08-31 DIAGNOSIS — Z992 Dependence on renal dialysis: Secondary | ICD-10-CM | POA: Diagnosis not present

## 2016-09-01 ENCOUNTER — Telehealth: Payer: Self-pay | Admitting: Internal Medicine

## 2016-09-01 MED ORDER — PREDNISONE 10 MG PO TABS: 10.0000 mg | ORAL_TABLET | Freq: Every day | ORAL | 0 refills | Status: DC

## 2016-09-01 NOTE — Telephone Encounter (Signed)
Pt c/o Shortness Of Breath: STAT if SOB developed within the last 24 hours or pt is noticeably SOB on the phone  1. Are you currently SOB (can you hear that pt is SOB on the phone)? No   2. How long have you been experiencing SOB? Wednesday went to [facility] ed on Thursday and was admitted had chest xray fluid on lung  3. Are you SOB when sitting or when up moving around? More on exertion o2 didn't help   4. Are you currently experiencing any other symptoms?  No other sx but sob not resolved

## 2016-09-01 NOTE — Telephone Encounter (Signed)
Pt states he was admitted on 08/28/16 for hypoxia & fluid on lungs. O2 level dropping as low as 80 with exertion but quickly recovering at rest. Pt c/o sob & occ wheezing.  Per VM (verbally) increase prednisone to 10mg  daily until f/u and wear 2L with exertion.  Rx sent to preferred pharmacy. Pt aware and voiced understanding. Nothing further needed.

## 2016-09-02 DIAGNOSIS — N2581 Secondary hyperparathyroidism of renal origin: Secondary | ICD-10-CM | POA: Diagnosis not present

## 2016-09-02 DIAGNOSIS — D631 Anemia in chronic kidney disease: Secondary | ICD-10-CM | POA: Diagnosis not present

## 2016-09-02 DIAGNOSIS — Z992 Dependence on renal dialysis: Secondary | ICD-10-CM | POA: Diagnosis not present

## 2016-09-02 DIAGNOSIS — N186 End stage renal disease: Secondary | ICD-10-CM | POA: Diagnosis not present

## 2016-09-02 DIAGNOSIS — D509 Iron deficiency anemia, unspecified: Secondary | ICD-10-CM | POA: Diagnosis not present

## 2016-09-04 ENCOUNTER — Other Ambulatory Visit: Payer: Self-pay | Admitting: Endocrinology

## 2016-09-05 DIAGNOSIS — D631 Anemia in chronic kidney disease: Secondary | ICD-10-CM | POA: Diagnosis not present

## 2016-09-05 DIAGNOSIS — N2581 Secondary hyperparathyroidism of renal origin: Secondary | ICD-10-CM | POA: Diagnosis not present

## 2016-09-05 DIAGNOSIS — N186 End stage renal disease: Secondary | ICD-10-CM | POA: Diagnosis not present

## 2016-09-05 DIAGNOSIS — Z992 Dependence on renal dialysis: Secondary | ICD-10-CM | POA: Diagnosis not present

## 2016-09-05 DIAGNOSIS — D509 Iron deficiency anemia, unspecified: Secondary | ICD-10-CM | POA: Diagnosis not present

## 2016-09-06 ENCOUNTER — Other Ambulatory Visit: Payer: Self-pay | Admitting: Family Medicine

## 2016-09-07 ENCOUNTER — Inpatient Hospital Stay
Admission: EM | Admit: 2016-09-07 | Discharge: 2016-09-11 | DRG: 291 | Disposition: A | Discharge status: Home / Self Care | Payer: Medicare Other | Attending: Internal Medicine | Admitting: Internal Medicine

## 2016-09-07 ENCOUNTER — Emergency Department: Payer: Medicare Other

## 2016-09-07 ENCOUNTER — Encounter: Payer: Self-pay | Admitting: Emergency Medicine

## 2016-09-07 DIAGNOSIS — J810 Acute pulmonary edema: Secondary | ICD-10-CM | POA: Diagnosis not present

## 2016-09-07 DIAGNOSIS — Z992 Dependence on renal dialysis: Secondary | ICD-10-CM | POA: Diagnosis not present

## 2016-09-07 DIAGNOSIS — J9621 Acute and chronic respiratory failure with hypoxia: Secondary | ICD-10-CM

## 2016-09-07 DIAGNOSIS — R51 Headache: Secondary | ICD-10-CM | POA: Diagnosis present

## 2016-09-07 DIAGNOSIS — I351 Nonrheumatic aortic (valve) insufficiency: Secondary | ICD-10-CM | POA: Diagnosis present

## 2016-09-07 DIAGNOSIS — J8489 Other specified interstitial pulmonary diseases: Secondary | ICD-10-CM | POA: Diagnosis present

## 2016-09-07 DIAGNOSIS — J9601 Acute respiratory failure with hypoxia: Secondary | ICD-10-CM | POA: Diagnosis present

## 2016-09-07 DIAGNOSIS — E1022 Type 1 diabetes mellitus with diabetic chronic kidney disease: Secondary | ICD-10-CM | POA: Diagnosis present

## 2016-09-07 DIAGNOSIS — I5033 Acute on chronic diastolic (congestive) heart failure: Secondary | ICD-10-CM | POA: Diagnosis not present

## 2016-09-07 DIAGNOSIS — E662 Morbid (severe) obesity with alveolar hypoventilation: Secondary | ICD-10-CM | POA: Diagnosis present

## 2016-09-07 DIAGNOSIS — R0600 Dyspnea, unspecified: Secondary | ICD-10-CM | POA: Diagnosis not present

## 2016-09-07 DIAGNOSIS — Z833 Family history of diabetes mellitus: Secondary | ICD-10-CM

## 2016-09-07 DIAGNOSIS — N2581 Secondary hyperparathyroidism of renal origin: Secondary | ICD-10-CM | POA: Diagnosis present

## 2016-09-07 DIAGNOSIS — Z6841 Body Mass Index (BMI) 40.0 and over, adult: Secondary | ICD-10-CM | POA: Diagnosis not present

## 2016-09-07 DIAGNOSIS — J440 Chronic obstructive pulmonary disease with acute lower respiratory infection: Secondary | ICD-10-CM | POA: Diagnosis present

## 2016-09-07 DIAGNOSIS — N186 End stage renal disease: Secondary | ICD-10-CM | POA: Diagnosis present

## 2016-09-07 DIAGNOSIS — G4730 Sleep apnea, unspecified: Secondary | ICD-10-CM | POA: Diagnosis not present

## 2016-09-07 DIAGNOSIS — Z794 Long term (current) use of insulin: Secondary | ICD-10-CM

## 2016-09-07 DIAGNOSIS — E10319 Type 1 diabetes mellitus with unspecified diabetic retinopathy without macular edema: Secondary | ICD-10-CM | POA: Diagnosis present

## 2016-09-07 DIAGNOSIS — J189 Pneumonia, unspecified organism: Secondary | ICD-10-CM | POA: Diagnosis present

## 2016-09-07 DIAGNOSIS — I160 Hypertensive urgency: Secondary | ICD-10-CM | POA: Diagnosis present

## 2016-09-07 DIAGNOSIS — G44009 Cluster headache syndrome, unspecified, not intractable: Secondary | ICD-10-CM | POA: Diagnosis not present

## 2016-09-07 DIAGNOSIS — E8770 Fluid overload, unspecified: Secondary | ICD-10-CM

## 2016-09-07 DIAGNOSIS — R0602 Shortness of breath: Secondary | ICD-10-CM

## 2016-09-07 DIAGNOSIS — N179 Acute kidney failure, unspecified: Secondary | ICD-10-CM | POA: Diagnosis present

## 2016-09-07 DIAGNOSIS — Z8249 Family history of ischemic heart disease and other diseases of the circulatory system: Secondary | ICD-10-CM

## 2016-09-07 DIAGNOSIS — E1042 Type 1 diabetes mellitus with diabetic polyneuropathy: Secondary | ICD-10-CM | POA: Diagnosis present

## 2016-09-07 DIAGNOSIS — Z7952 Long term (current) use of systemic steroids: Secondary | ICD-10-CM

## 2016-09-07 DIAGNOSIS — J441 Chronic obstructive pulmonary disease with (acute) exacerbation: Secondary | ICD-10-CM | POA: Diagnosis present

## 2016-09-07 DIAGNOSIS — M25559 Pain in unspecified hip: Secondary | ICD-10-CM

## 2016-09-07 DIAGNOSIS — Z91048 Other nonmedicinal substance allergy status: Secondary | ICD-10-CM

## 2016-09-07 DIAGNOSIS — Z7982 Long term (current) use of aspirin: Secondary | ICD-10-CM

## 2016-09-07 DIAGNOSIS — Z9889 Other specified postprocedural states: Secondary | ICD-10-CM

## 2016-09-07 DIAGNOSIS — E875 Hyperkalemia: Secondary | ICD-10-CM | POA: Diagnosis present

## 2016-09-07 DIAGNOSIS — Z886 Allergy status to analgesic agent status: Secondary | ICD-10-CM

## 2016-09-07 DIAGNOSIS — R0902 Hypoxemia: Secondary | ICD-10-CM

## 2016-09-07 DIAGNOSIS — J9600 Acute respiratory failure, unspecified whether with hypoxia or hypercapnia: Secondary | ICD-10-CM | POA: Diagnosis present

## 2016-09-07 DIAGNOSIS — D631 Anemia in chronic kidney disease: Secondary | ICD-10-CM | POA: Diagnosis present

## 2016-09-07 DIAGNOSIS — I4581 Long QT syndrome: Secondary | ICD-10-CM | POA: Diagnosis present

## 2016-09-07 DIAGNOSIS — I132 Hypertensive heart and chronic kidney disease with heart failure and with stage 5 chronic kidney disease, or end stage renal disease: Principal | ICD-10-CM | POA: Diagnosis present

## 2016-09-07 DIAGNOSIS — J918 Pleural effusion in other conditions classified elsewhere: Secondary | ICD-10-CM | POA: Diagnosis not present

## 2016-09-07 DIAGNOSIS — E785 Hyperlipidemia, unspecified: Secondary | ICD-10-CM | POA: Diagnosis present

## 2016-09-07 LAB — CBC WITH DIFFERENTIAL/PLATELET
BASOS ABS: 0.1 10*3/uL (ref 0–0.1)
Basophils Relative: 1 %
EOS ABS: 0 10*3/uL (ref 0–0.7)
EOS PCT: 0 %
HCT: 33.2 % — ABNORMAL LOW (ref 40.0–52.0)
Hemoglobin: 10.5 g/dL — ABNORMAL LOW (ref 13.0–18.0)
LYMPHS PCT: 9 %
Lymphs Abs: 1.1 10*3/uL (ref 1.0–3.6)
MCH: 29.5 pg (ref 26.0–34.0)
MCHC: 31.7 g/dL — ABNORMAL LOW (ref 32.0–36.0)
MCV: 93.1 fL (ref 80.0–100.0)
Monocytes Absolute: 0.6 10*3/uL (ref 0.2–1.0)
Monocytes Relative: 5 %
NEUTROS PCT: 85 %
Neutro Abs: 10.6 10*3/uL — ABNORMAL HIGH (ref 1.4–6.5)
PLATELETS: 370 10*3/uL (ref 150–440)
RBC: 3.57 MIL/uL — AB (ref 4.40–5.90)
RDW: 17.6 % — ABNORMAL HIGH (ref 11.5–14.5)
WBC: 12.4 10*3/uL — AB (ref 3.8–10.6)

## 2016-09-07 LAB — BASIC METABOLIC PANEL
ANION GAP: 13 (ref 5–15)
BUN: 77 mg/dL — ABNORMAL HIGH (ref 6–20)
CALCIUM: 7.4 mg/dL — AB (ref 8.9–10.3)
CO2: 28 mmol/L (ref 22–32)
Chloride: 100 mmol/L — ABNORMAL LOW (ref 101–111)
Creatinine, Ser: 11.96 mg/dL — ABNORMAL HIGH (ref 0.61–1.24)
GFR, EST AFRICAN AMERICAN: 5 mL/min — AB (ref >60)
GFR, EST NON AFRICAN AMERICAN: 4 mL/min — AB (ref >60)
Glucose, Bld: 240 mg/dL — ABNORMAL HIGH (ref 65–99)
Potassium: 5.8 mmol/L — ABNORMAL HIGH (ref 3.5–5.1)
Sodium: 141 mmol/L (ref 135–145)

## 2016-09-07 LAB — TROPONIN I
Troponin I: 0.04 ng/mL (ref <0.03)
Troponin I: 0.07 ng/mL (ref <0.03)

## 2016-09-07 LAB — GLUCOSE, CAPILLARY: Glucose-Capillary: 131 mg/dL — ABNORMAL HIGH (ref 65–99)

## 2016-09-07 LAB — BRAIN NATRIURETIC PEPTIDE: B NATRIURETIC PEPTIDE 5: 1690 pg/mL — AB (ref 0.0–100.0)

## 2016-09-07 MED ORDER — ALBUTEROL SULFATE (2.5 MG/3ML) 0.083% IN NEBU
2.5000 mg | INHALATION_SOLUTION | RESPIRATORY_TRACT | Status: DC | PRN
Start: 2016-09-07 — End: 2016-09-11
  Administered 2016-09-07: 2.5 mg via RESPIRATORY_TRACT
  Filled 2016-09-07: qty 3

## 2016-09-07 MED ORDER — ONDANSETRON HCL 4 MG/2ML IJ SOLN
4.0000 mg | Freq: Once | INTRAMUSCULAR | Status: AC
  Administered 2016-09-07: 4 mg via INTRAVENOUS
  Filled 2016-09-07: qty 2

## 2016-09-07 MED ORDER — ALBUTEROL SULFATE (2.5 MG/3ML) 0.083% IN NEBU: 5.0000 mg | INHALATION_SOLUTION | Freq: Once | RESPIRATORY_TRACT | Status: DC

## 2016-09-07 MED ORDER — DULOXETINE HCL 60 MG PO CPEP
120.0000 mg | ORAL_CAPSULE | Freq: Every day | ORAL | Status: DC
  Administered 2016-09-08 – 2016-09-11 (×3): 120 mg via ORAL
  Filled 2016-09-07 (×3): qty 2

## 2016-09-07 MED ORDER — ENOXAPARIN SODIUM 40 MG/0.4ML ~~LOC~~ SOLN: 40.0000 mg | SUBCUTANEOUS | Status: DC

## 2016-09-07 MED ORDER — METOPROLOL TARTRATE 50 MG PO TABS
50.0000 mg | ORAL_TABLET | Freq: Two times a day (BID) | ORAL | Status: DC
  Administered 2016-09-07 – 2016-09-11 (×7): 50 mg via ORAL
  Filled 2016-09-07 (×7): qty 1

## 2016-09-07 MED ORDER — RENA-VITE PO TABS
1.0000 | ORAL_TABLET | Freq: Every day | ORAL | Status: DC
  Administered 2016-09-08 – 2016-09-11 (×3): 1 via ORAL
  Filled 2016-09-07 (×3): qty 1

## 2016-09-07 MED ORDER — HEPARIN SODIUM (PORCINE) 5000 UNIT/ML IJ SOLN
5000.0000 [IU] | Freq: Two times a day (BID) | INTRAMUSCULAR | Status: DC
  Administered 2016-09-08: 5000 [IU] via SUBCUTANEOUS
  Filled 2016-09-07: qty 1

## 2016-09-07 MED ORDER — ALBUTEROL SULFATE (2.5 MG/3ML) 0.083% IN NEBU
3.0000 mL | INHALATION_SOLUTION | Freq: Four times a day (QID) | RESPIRATORY_TRACT | Status: DC | PRN
  Filled 2016-09-07: qty 3

## 2016-09-07 MED ORDER — IPRATROPIUM-ALBUTEROL 0.5-2.5 (3) MG/3ML IN SOLN
RESPIRATORY_TRACT | Status: AC
  Administered 2016-09-07: 6 mL via RESPIRATORY_TRACT
  Filled 2016-09-07: qty 6

## 2016-09-07 MED ORDER — METHYLPREDNISOLONE SODIUM SUCC 40 MG IJ SOLR
40.0000 mg | Freq: Four times a day (QID) | INTRAMUSCULAR | Status: DC
  Administered 2016-09-07 – 2016-09-08 (×3): 40 mg via INTRAVENOUS
  Filled 2016-09-07 (×3): qty 1

## 2016-09-07 MED ORDER — IRBESARTAN 75 MG PO TABS
37.5000 mg | ORAL_TABLET | Freq: Every day | ORAL | Status: DC
  Administered 2016-09-07 – 2016-09-08 (×2): 37.5 mg via ORAL
  Filled 2016-09-07 (×2): qty 1

## 2016-09-07 MED ORDER — VITAMIN B-1 100 MG PO TABS
100.0000 mg | ORAL_TABLET | Freq: Every day | ORAL | Status: DC
  Administered 2016-09-08 – 2016-09-11 (×3): 100 mg via ORAL
  Filled 2016-09-07 (×3): qty 1

## 2016-09-07 MED ORDER — ORAL CARE MOUTH RINSE
15.0000 mL | Freq: Two times a day (BID) | OROMUCOSAL | Status: DC
  Administered 2016-09-08 – 2016-09-11 (×7): 15 mL via OROMUCOSAL

## 2016-09-07 MED ORDER — SEVELAMER CARBONATE 800 MG PO TABS
800.0000 mg | ORAL_TABLET | Freq: Three times a day (TID) | ORAL | Status: DC
  Administered 2016-09-07 – 2016-09-11 (×10): 800 mg via ORAL
  Filled 2016-09-07 (×10): qty 1

## 2016-09-07 MED ORDER — IPRATROPIUM-ALBUTEROL 0.5-2.5 (3) MG/3ML IN SOLN
6.0000 mL | Freq: Once | RESPIRATORY_TRACT | Status: AC
  Administered 2016-09-07: 6 mL via RESPIRATORY_TRACT

## 2016-09-07 MED ORDER — SODIUM CHLORIDE 0.9% FLUSH
3.0000 mL | Freq: Two times a day (BID) | INTRAVENOUS | Status: DC
  Administered 2016-09-07 – 2016-09-11 (×8): 3 mL via INTRAVENOUS

## 2016-09-07 MED ORDER — EPOETIN ALFA 4000 UNIT/ML IJ SOLN
4000.0000 [IU] | INTRAMUSCULAR | Status: DC
  Administered 2016-09-08 – 2016-09-10 (×2): 4000 [IU] via INTRAVENOUS
  Filled 2016-09-07 (×2): qty 1

## 2016-09-07 MED ORDER — INSULIN ASPART 100 UNIT/ML ~~LOC~~ SOLN
0.0000 [IU] | Freq: Three times a day (TID) | SUBCUTANEOUS | Status: DC
  Administered 2016-09-08: 4 [IU] via SUBCUTANEOUS
  Administered 2016-09-08: 20 [IU] via SUBCUTANEOUS
  Filled 2016-09-07: qty 4
  Filled 2016-09-07: qty 20

## 2016-09-07 MED ORDER — ATORVASTATIN CALCIUM 20 MG PO TABS
40.0000 mg | ORAL_TABLET | Freq: Every day | ORAL | Status: DC
  Administered 2016-09-07 – 2016-09-10 (×4): 40 mg via ORAL
  Filled 2016-09-07 (×4): qty 2

## 2016-09-07 MED ORDER — ASPIRIN EC 81 MG PO TBEC
81.0000 mg | DELAYED_RELEASE_TABLET | Freq: Every day | ORAL | Status: DC
  Administered 2016-09-08 – 2016-09-11 (×3): 81 mg via ORAL
  Filled 2016-09-07 (×3): qty 1

## 2016-09-07 MED ORDER — INSULIN ASPART 100 UNIT/ML ~~LOC~~ SOLN: 0.0000 [IU] | Freq: Every day | SUBCUTANEOUS | Status: DC

## 2016-09-07 MED ORDER — GABAPENTIN 300 MG PO CAPS
300.0000 mg | ORAL_CAPSULE | Freq: Three times a day (TID) | ORAL | Status: DC
  Administered 2016-09-07 – 2016-09-11 (×10): 300 mg via ORAL
  Filled 2016-09-07 (×10): qty 1

## 2016-09-07 MED ORDER — METHYLPREDNISOLONE SODIUM SUCC 125 MG IJ SOLR
125.0000 mg | Freq: Once | INTRAMUSCULAR | Status: AC
  Administered 2016-09-07: 125 mg via INTRAVENOUS
  Filled 2016-09-07: qty 2

## 2016-09-07 MED ORDER — MORPHINE SULFATE (PF) 4 MG/ML IV SOLN
2.0000 mg | Freq: Once | INTRAVENOUS | Status: AC
  Administered 2016-09-07: 2 mg via INTRAVENOUS
  Filled 2016-09-07: qty 1

## 2016-09-07 MED ORDER — IPRATROPIUM-ALBUTEROL 0.5-2.5 (3) MG/3ML IN SOLN
3.0000 mL | Freq: Three times a day (TID) | RESPIRATORY_TRACT | Status: DC
  Administered 2016-09-08 – 2016-09-11 (×8): 3 mL via RESPIRATORY_TRACT
  Filled 2016-09-07 (×8): qty 3

## 2016-09-07 MED ORDER — IPRATROPIUM-ALBUTEROL 0.5-2.5 (3) MG/3ML IN SOLN: 3.0000 mL | Freq: Four times a day (QID) | RESPIRATORY_TRACT | Status: DC

## 2016-09-07 MED ORDER — FUROSEMIDE 10 MG/ML IJ SOLN
80.0000 mg | Freq: Once | INTRAMUSCULAR | Status: AC
  Administered 2016-09-07: 80 mg via INTRAVENOUS
  Filled 2016-09-07: qty 8

## 2016-09-07 MED ORDER — INSULIN ASPART 100 UNIT/ML ~~LOC~~ SOLN
40.0000 [IU] | Freq: Three times a day (TID) | SUBCUTANEOUS | Status: DC
  Administered 2016-09-08 – 2016-09-09 (×3): 40 [IU] via SUBCUTANEOUS
  Filled 2016-09-07 (×3): qty 40

## 2016-09-07 MED ORDER — SODIUM POLYSTYRENE SULFONATE 15 GM/60ML PO SUSP: 30.0000 g | Freq: Once | ORAL | Status: DC

## 2016-09-07 MED ORDER — LIDOCAINE-PRILOCAINE 2.5-2.5 % EX CREA: TOPICAL_CREAM | Freq: Every day | CUTANEOUS | Status: DC | PRN

## 2016-09-07 MED ORDER — PANTOPRAZOLE SODIUM 40 MG PO TBEC
40.0000 mg | DELAYED_RELEASE_TABLET | Freq: Every day | ORAL | Status: DC
  Administered 2016-09-08 – 2016-09-11 (×3): 40 mg via ORAL
  Filled 2016-09-07 (×3): qty 1

## 2016-09-07 MED ORDER — INSULIN ASPART 100 UNIT/ML FLEXPEN: 40.0000 [IU] | PEN_INJECTOR | Freq: Three times a day (TID) | SUBCUTANEOUS | Status: DC

## 2016-09-07 MED ORDER — CINACALCET HCL 30 MG PO TABS
30.0000 mg | ORAL_TABLET | Freq: Every day | ORAL | Status: DC
  Administered 2016-09-07 – 2016-09-10 (×4): 30 mg via ORAL
  Filled 2016-09-07 (×4): qty 1

## 2016-09-07 MED ORDER — FUROSEMIDE 40 MG PO TABS
80.0000 mg | ORAL_TABLET | Freq: Every day | ORAL | Status: DC
  Administered 2016-09-08 – 2016-09-11 (×3): 80 mg via ORAL
  Filled 2016-09-07 (×3): qty 2

## 2016-09-07 NOTE — H&P (Signed)
[facility] [place] at [facility]   PATIENT NAME: Ryan Arnold    MR#:  [id number]  DATE OF BIRTH:  1961/03/27  DATE OF ADMISSION:  09/07/2016  PRIMARY CARE PHYSICIAN: Tommie Sams, DO   REQUESTING/REFERRING PHYSICIAN: Dr. Shaune Pollack  CHIEF COMPLAINT:  Shortness of breath  HISTORY OF PRESENT ILLNESS:  Ryan Arnold  is a 55 y.o. male with a known history of COPD, end-stage renal disease on hemodialysis on Monday Wednesday and Friday, last dialyzed on Friday is presenting to the ED with a chief complaint of worsening of shortness of breath. Patient was reporting he has been wheezing but denies any chest pain or palpitations. Patient's potassium is at 5.8. Seen by nephrology Dr. Thedore Mins who is taking the patient for urgent dialysis for fluid overload and hyperkalemia. Patient was initially placed on BiPAP  PAST MEDICAL HISTORY:   Past Medical History:  Diagnosis Date  . Anemia of chronic disease   . BOOP (bronchiolitis obliterans with organizing pneumonia) (HCC)    a. 01/2018 s/p R thoracoscopy/Bx confirming BOOP;  b. 02/2018 high dose steroids started.  . Chest pain    a. 06/2014 Myoview ([facility]) no ischemia/infarct, nl EF.  [name] Chronic diastolic CHF (congestive heart failure) (HCC)    a. 12/2014 Echo: EF 50-55%, mild conc LVH, mildly dil LA.  . End stage renal disease (HCC)    a. on dialysis; still makes urine.  . Essential hypertension   . Hyperlipemia   . Morbid obesity (HCC)   . OSA treated with BiPAP   . Recurrent pneumonia    a. 2/2 BOOP.  [name] Secondary hyperparathyroidism (HCC)   . Thyroid disease   . Type 1 diabetes mellitus (HCC)     PAST SURGICAL HISTOIRY:   Past Surgical History:  Procedure Laterality Date  . cataract Bilateral   . GALLBLADDER SURGERY    . KNEE SURGERY Right   . LUNG BIOPSY    . SHOULDER SURGERY Left     SOCIAL HISTORY:   Social History  Substance Use Topics  . Smoking status: Never Smoker  . Smokeless tobacco: Never Used   . Alcohol use No    FAMILY HISTORY:   Family History  Problem Relation Age of Onset  . Diabetes Mother     died @ 58.  [name] Hypertension Mother   . Hypertension Maternal Grandmother     DRUG ALLERGIES:   Allergies  Allergen Reactions  . Oxycodone-Acetaminophen Nausea And Vomiting  . Tape Other (See Comments)    Paper tape caused blisters    REVIEW OF SYSTEMS:  CONSTITUTIONAL: No fever, fatigue or weakness.  EYES: No blurred or double vision.  EARS, NOSE, AND THROAT: No tinnitus or ear pain.  RESPIRATORY: No cough, Reporting shortness of breath, denies wheezing or hemoptysis.  CARDIOVASCULAR: No chest pain, orthopnea, edema.  GASTROINTESTINAL: No nausea, vomiting, diarrhea or abdominal pain.  GENITOURINARY: No dysuria, hematuria.  ENDOCRINE: No polyuria, nocturia,  HEMATOLOGY: No anemia, easy bruising or bleeding SKIN: No rash or lesion. MUSCULOSKELETAL: No joint pain or arthritis.   NEUROLOGIC: No tingling, numbness, weakness.  PSYCHIATRY: No anxiety or depression.   MEDICATIONS AT HOME:   Prior to Admission medications   Medication Sig Start Date End Date Taking? Authorizing Provider  albuterol (PROVENTIL HFA) 108 (90 Base) MCG/ACT inhaler Inhale 2 puffs into the lungs every 6 (six) hours as needed for wheezing or shortness of breath. 08/12/16  Yes Allegra Grana, FNP  aspirin EC 81 MG tablet  Take 81 mg by mouth daily.   Yes Historical Provider, MD  atorvastatin (LIPITOR) 40 MG tablet Take 40 mg by mouth daily. 06/03/16  Yes Historical Provider, MD  DULoxetine (CYMBALTA) 60 MG capsule Take 2 capsules (120 mg total) by mouth daily. 06/03/16  Yes Tommie SamsJayce G Cook, DO  furosemide (LASIX) 80 MG tablet Take 1 tablet (80 mg total) by mouth daily. 08/30/16  Yes Enid Baasadhika Kalisetti, MD  gabapentin (NEURONTIN) 300 MG capsule take 1 capsule by mouth three times a day 09/07/16  Yes Reather LittlerAjay Kumar, MD  insulin aspart (NOVOLOG FLEXPEN) 100 UNIT/ML FlexPen Inject 40 Units into the skin 3  (three) times daily with meals.   Yes Historical Provider, MD  metoprolol (LOPRESSOR) 50 MG tablet Take 50 mg by mouth 2 (two) times daily. 12/23/14  Yes Historical Provider, MD  multivitamin (RENA-VIT) TABS tablet Take 1 tablet by mouth daily. 01/09/14  Yes Historical Provider, MD  pantoprazole (PROTONIX) 40 MG tablet take 1 tablet by mouth once daily 03/20/16  Yes Jayce G Cook, DO  predniSONE (DELTASONE) 10 MG tablet Take 1 tablet (10 mg total) by mouth daily with breakfast. Patient taking differently: Take 10 mg by mouth 2 (two) times daily with a meal.  09/01/16  Yes [name] Mungal, MD  SENSIPAR 30 MG tablet Take 30 mg by mouth at bedtime. Reported on 04/29/2016 12/18/14  Yes Historical Provider, MD  sevelamer carbonate (RENVELA) 800 MG tablet Take 800 mg by mouth 3 (three) times daily. And 2 tablet with snacks   Yes Historical Provider, MD  valsartan (DIOVAN) 80 MG tablet Take 80 mg by mouth at bedtime.  05/29/15  Yes Historical Provider, MD  B-D ULTRAFINE III SHORT PEN 31G X 8 MM MISC use as directed 06/07/15   Tommie SamsJayce G Cook, DO  ONE TOUCH ULTRA TEST test strip TEST BLOOD SUGAR four times a day 04/14/16   Tommie SamsJayce G Cook, DO  TRESIBA FLEXTOUCH 100 UNIT/ML SOPN FlexTouch Pen inject 40 units subcutaneously once daily 08/25/16   Reather LittlerAjay Kumar, MD      VITAL SIGNS:  Blood pressure (!) 169/77, pulse 70, temperature 97.7 F (36.5 C), temperature source Oral, resp. rate 18, height 6\' 1"  (1.854 m), weight (!) 144.7 kg (319 lb), SpO2 97 %.  PHYSICAL EXAMINATION:  GENERAL:  55 y.o.-year-old patient lying in the bed with no acute distress.  EYES: Pupils equal, round, reactive to light and accommodation. No scleral icterus. Extraocular muscles intact.  HEENT: Head atraumatic, normocephalic. Oropharynx and nasopharynx clear.  NECK:  Supple, no jugular venous distention. No thyroid enlargement, no tenderness.  LUNGS:  moderate breath sounds bilaterally, minimal wheezing,  minimal rales, no rhonchi or crepitation. No  use of accessory muscles of respiration.  CARDIOVASCULAR: S1, S2 normal. No murmurs, rubs, or gallops.  ABDOMEN: Soft, nontender, nondistended. Bowel sounds present. No organomegaly or mass.  EXTREMITIES: No pedal edema, cyanosis, or clubbing.  left upper extremity with AV fistula  NEUROLOGIC: Cranial nerves II through XII are intact. Muscle strength 5/5 in all extremities. Sensation intact. Gait not checked.  PSYCHIATRIC: The patient is alert and oriented x 3.  SKIN: No obvious rash, lesion, or ulcer.   LABORATORY PANEL:   CBC  Recent Labs Lab 09/07/16 1327  WBC 12.4*  HGB 10.5*  HCT 33.2*  PLT 370   ------------------------------------------------------------------------------------------------------------------  Chemistries   Recent Labs Lab 09/07/16 1327  NA 141  K 5.8*  CL 100*  CO2 28  GLUCOSE 240*  BUN 77*  CREATININE 11.96*  CALCIUM 7.4*   ------------------------------------------------------------------------------------------------------------------  Cardiac Enzymes  Recent Labs Lab 09/07/16 1327  TROPONINI 0.04*   ------------------------------------------------------------------------------------------------------------------  RADIOLOGY:  Dg Chest Portable 1 View  Result Date: 09/07/2016 CLINICAL DATA:  Respiratory distress EXAM: PORTABLE CHEST 1 VIEW COMPARISON:  08/28/2016 FINDINGS: Cardiac shadow is mildly enlarged but stable. Patchy changes are again seen in the right lung base stable from the prior exam. No new focal infiltrate or sizable effusion is noted. Mild central vascular congestion remains. No osseous abnormality is seen. IMPRESSION: Stable changes in the right lung base similar to that seen on prior exam. Electronically Signed   By: Alcide CleverMark  Lukens M.D.   On: 09/07/2016 14:07    EKG:   Orders placed or performed during the hospital encounter of 09/07/16  . ED EKG  . ED EKG  . EKG 12-Lead  . EKG 12-Lead    IMPRESSION AND PLAN:    Ryan Arnold  is a 55 y.o. male with a known history of COPD, end-stage renal disease on hemodialysis on Monday Wednesday and Friday, last dialyzed on Friday is presenting to the ED with a chief complaint of worsening of shortness of breath. Patient was reporting he has been wheezing but denies any chest pain or palpitations. Patient's potassium is at 5.8. Seen by nephrology Dr. Thedore MinsSingh who is taking the patient for urgent dialysis for fluid overload and hyperkalemia  #  acute respiratory failure secondary to COPD exacerbation and fluid overload Admit to stepdown unit after urgent hemodialysis Nephrology consult urgent for hemodialysis  #Acute COPD exacerbation Solum Medrol, nebulizer treatments with duo nebs every 6 hours and albuterol as needed Sputum culture and sensitivity  #Fluid overload with history of end-stage renal disease on hemodialysis Patient is taken to the urgent dialysis by nephrology from the ED directly Hyperkalemia but no acute T-wave changes on the EKG and patient denies any chest pain Follow-up with nephrology and continue hemodialysis on Monday, Wednesday and Friday  #Hyperkalemia Urgent hemodialysis  #Insulin-dependent diabetes mellitus Resume patient's home insulin and sliding scale insulin, diabetic diet    All the records are reviewed and case discussed with ED provider. Management plans discussed with the patient, family and they are in agreement.  CODE STATUS: fc,wife HCPOA  TOTAL criticalcare TIME TAKING CARE OF THIS PATIENT: 50  minutes.   Note: This dictation was prepared with Dragon dictation along with smaller phrase technology. Any transcriptional errors that result from this process are unintentional.  Ramonita LabGouru, Shareef Eddinger M.D on 09/07/2016 at 10:10 PM  Between 7am to 6pm - Pager - [phone]  After 6pm go to [email or web address] - password [email or web address] [facility]  [facility]  Office  [phone]  CC: Primary care physician; Tommie SamsJayce G Cook,  DO

## 2016-09-07 NOTE — Progress Notes (Signed)
Pre dialysis assessment 

## 2016-09-07 NOTE — Consult Note (Signed)
[facility]      Assessment and Plan:  Acute hypoxic respiratory failure.  --Likely multifactorial from volume overload, hypertensive urgency, pulmonary edema, volume overload, ESRD.  --Continue bipap qhs, lasix IV, BP controled.   Discussed with hospitalist service for admission.   Date: 09/07/2016  MRN# [id number] Ryan Arnold 11/22/1960  Referring Physician: Dr. Isabelle CourseLord  Ryan Arnold is a 55 y.o. old male seen in consultation for chief complaint of:    Chief Complaint  Patient presents with  . Respiratory Distress    HPI:   The patient is a 55 yo male with a history of BOOP, ESRD, he has seen Dr. Dema SeverinMungal in the office.  He uses bipap at home, he presents with respiratory difficulty over the past few days which has been progressive. He takes prednisone 20 mg daily.  He was brought by EMS in bipap, and has maintained on this since that time.  He is speaking clearly through his bipap and appears to have no conversational dyspnea.   He was taken off of bipap to RA for 5 min with sat drop to 85-88%; without dyspnea. He was placed on [place] on 6L; with sat increase to 100%. Patient tell me he is on 3L at baseline.   Review of CXr images shows chronically right sided elevated diaphragm with mild pulmonary edema and atelectasis with obesity, BNP is elevated at 1690.     PMHX:   Past Medical History:  Diagnosis Date  . Anemia of chronic disease   . BOOP (bronchiolitis obliterans with organizing pneumonia) (HCC)    a. 01/2018 s/p R thoracoscopy/Bx confirming BOOP;  b. 02/2018 high dose steroids started.  . Chest pain    a. 06/2014 Myoview ([facility]) no ischemia/infarct, nl EF.  [name]. Chronic diastolic CHF (congestive heart failure) (HCC)    a. 12/2014 Echo: EF 50-55%, mild conc LVH, mildly dil LA.  . End stage renal disease (HCC)    a. on dialysis; still makes urine.  . Essential hypertension   . Hyperlipemia   . Morbid obesity (HCC)   . OSA treated with  BiPAP   . Recurrent pneumonia    a. 2/2 BOOP.  [name]. Secondary hyperparathyroidism (HCC)   . Thyroid disease   . Type 1 diabetes mellitus (HCC)    Surgical Hx:  Past Surgical History:  Procedure Laterality Date  . cataract Bilateral   . GALLBLADDER SURGERY    . KNEE SURGERY Right   . LUNG BIOPSY    . SHOULDER SURGERY Left    Family Hx:  Family History  Problem Relation Age of Onset  . Diabetes Mother     died @ 262.  [name]. Hypertension Mother   . Hypertension Maternal Grandmother    Social Hx:   Social History  Substance Use Topics  . Smoking status: Never Smoker  . Smokeless tobacco: Never Used  . Alcohol use No   Medication:   Reviewed.     Allergies:  Oxycodone-acetaminophen and Tape  Review of Systems: Gen:  Denies  fever, sweats, chills HEENT: Denies blurred vision, double vision.  Cvc:  No dizziness, chest pain. Resp:   Denies cough or sputum production. Gi: Denies swallowing difficulty, stomach pain. Gu:  Denies bladder incontinence, burning urine Ext:   No Joint pain, stiffness. Skin: No skin rash,  hives  Endoc:  No polyuria, polydipsia. Psych: No depression, insomnia. Other:  All other systems were reviewed with the patient and were negative other that what is  mentioned in the HPI.   Physical Examination:   VS: BP (!) 159/78   Pulse 66   Temp 97.2 F (36.2 C) (Oral)   Resp 17   Ht 6\' 1"  (1.854 m)   Wt (!) 144.7 kg (319 lb)   SpO2 100%   BMI 42.09 kg/m   General Appearance: No distress  Neuro:without focal findings,  speech normal,  HEENT: PERRLA, EOM intact.   Pulmonary: normal breath sounds, No wheezing.  CardiovascularNormal S1,S2.  No m/r/g.   Abdomen: Benign, Soft, non-tender. Renal:  No costovertebral tenderness  GU:  No performed at this time. Endoc: No evident thyromegaly, no signs of acromegaly. Skin:   warm, no rashes, no ecchymosis  Extremities: normal, no cyanosis, clubbing.  Other findings:    LABORATORY PANEL:    CBC  Recent Labs Lab 09/07/16 1327  WBC 12.4*  HGB 10.5*  HCT 33.2*  PLT 370   ------------------------------------------------------------------------------------------------------------------  Chemistries   Recent Labs Lab 09/07/16 1327  NA 141  K 5.8*  CL 100*  CO2 28  GLUCOSE 240*  BUN 77*  CREATININE 11.96*  CALCIUM 7.4*   ------------------------------------------------------------------------------------------------------------------  Cardiac Enzymes  Recent Labs Lab 09/07/16 1327  TROPONINI 0.04*   ------------------------------------------------------------  RADIOLOGY:  Dg Chest Portable 1 View  Result Date: 09/07/2016 CLINICAL DATA:  Respiratory distress EXAM: PORTABLE CHEST 1 VIEW COMPARISON:  08/28/2016 FINDINGS: Cardiac shadow is mildly enlarged but stable. Patchy changes are again seen in the right lung base stable from the prior exam. No new focal infiltrate or sizable effusion is noted. Mild central vascular congestion remains. No osseous abnormality is seen. IMPRESSION: Stable changes in the right lung base similar to that seen on prior exam. Electronically Signed   By: Alcide CleverMark  Lukens M.D.   On: 09/07/2016 14:07       Thank  you for the consultation and for allowing [facility] to assist in the care of your patient. Our recommendations are noted above.  Please contact us if we can be of further service.   Wells Guileseep Teka Chanda, MD.  Board Certified in Internal Medicine, Pulmonary Medicine, Critical Care Medicine, and Sleep Medicine.  [facility] Office Number: [phone]  Santiago Gladavid Kasa, M.D.  Stephanie AcreVishal Mungal, M.D.  Billy Fischeravid Simonds, M.D  09/07/2016

## 2016-09-07 NOTE — Progress Notes (Signed)
HD initiated without issue. 4L goal. Pt currently in no distress. Sats 100% on 3L [place]

## 2016-09-07 NOTE — Progress Notes (Signed)
Post HD assessment  

## 2016-09-07 NOTE — ED Notes (Signed)
Patient transported to dialysis

## 2016-09-07 NOTE — ED Provider Notes (Signed)
[facility] Emergency Department Provider Note ____________________________________________   I have reviewed the triage vital signs and the triage nursing note.  HISTORY  Chief Complaint Respiratory Distress   Historian Level V caveat, patient with respiratory distress  HPI Ryan Arnold is a 55 y.o. male with a history of COPD per the patient, although BOOP noted in his chart, as well as dialysis patient, last dialyzed on Friday, presents with shortness of breath it's been worsening since Friday. He states actually his shortness of breath started about 3 weeks ago and it seems to be progressing. Denies fever. Mild but nonproductive cough.  He states that he's been wheezing. He reports that he is been well for at least 2 years in terms of acute dyspnea. No chest pain. Denies palpitations.  States that his lower extremity at edema is pretty similar to prior.  Dyspnea is moderate to severe. Exertion seems to make it worse.    Past Medical History:  Diagnosis Date  . Anemia of chronic disease   . BOOP (bronchiolitis obliterans with organizing pneumonia) (HCC)    a. 01/2018 s/p R thoracoscopy/Bx confirming BOOP;  b. 02/2018 high dose steroids started.  . Chest pain    a. 06/2014 Myoview ([facility]) no ischemia/infarct, nl EF.  [name] Chronic diastolic CHF (congestive heart failure) (HCC)    a. 12/2014 Echo: EF 50-55%, mild conc LVH, mildly dil LA.  . End stage renal disease (HCC)    a. on dialysis; still makes urine.  . Essential hypertension   . Hyperlipemia   . Morbid obesity (HCC)   . OSA treated with BiPAP   . Recurrent pneumonia    a. 2/2 BOOP.  [name] Secondary hyperparathyroidism (HCC)   . Thyroid disease   . Type 1 diabetes mellitus [facility])     Patient Active Problem List   Diagnosis Date Noted  . SOB (shortness of breath) 08/29/2016  . Abdominal pain 08/12/2016  . Acute allergic rhinitis 08/12/2016  . Trigger finger, acquired 08/12/2016  . Gastroenteritis  08/05/2016  . Impotence 06/05/2015  . Preventative health care 06/05/2015  . PVC's (premature ventricular contractions) 04/23/2015  . Anemia of chronic disease   . Secondary hyperparathyroidism (HCC)   . Morbid obesity (HCC)   . Chronic diastolic CHF (congestive heart failure) (HCC)   . Essential hypertension   . 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 Proliferative retinopathy 03/15/2015  . CKD (chronic kidney disease) stage V requiring chronic dialysis (HCC) 03/15/2015  . Hyperlipidemia 03/15/2015  . BOOP (bronchiolitis obliterans with organizing pneumonia) (HCC) 03/01/2015  . OSA (obstructive sleep apnea) 01/18/2015  . Recurrent pneumonia 01/15/2015    Past Surgical History:  Procedure Laterality Date  . cataract Bilateral   . GALLBLADDER SURGERY    . KNEE SURGERY Right   . LUNG BIOPSY    . SHOULDER SURGERY Left     Prior to Admission medications   Medication Sig Start Date End Date Taking? Authorizing Provider  albuterol (PROVENTIL HFA) 108 (90 Base) MCG/ACT inhaler Inhale 2 puffs into the lungs every 6 (six) hours as needed for wheezing or shortness of breath. 08/12/16  Yes Allegra Grana, FNP  aspirin EC 81 MG tablet Take 81 mg by mouth daily.   Yes Historical Provider, MD  atorvastatin (LIPITOR) 40 MG tablet Take 40 mg by mouth daily. 06/03/16  Yes Historical Provider, MD  DULoxetine (CYMBALTA) 60 MG capsule Take 2 capsules (120 mg total) by mouth daily. 06/03/16  Yes  Tommie SamsJayce G Cook, DO  furosemide (LASIX) 80 MG tablet Take 1 tablet (80 mg total) by mouth daily. 08/30/16  Yes Enid Baasadhika Kalisetti, MD  gabapentin (NEURONTIN) 300 MG capsule take 1 capsule by mouth three times a day 09/07/16  Yes Reather LittlerAjay Kumar, MD  insulin aspart (NOVOLOG FLEXPEN) 100 UNIT/ML FlexPen Inject 40 Units into the skin 3 (three) times daily with meals.   Yes Historical Provider, MD  metoprolol (LOPRESSOR) 50 MG tablet Take 50 mg by mouth 2 (two) times daily. 12/23/14  Yes  Historical Provider, MD  multivitamin (RENA-VIT) TABS tablet Take 1 tablet by mouth daily. 01/09/14  Yes Historical Provider, MD  pantoprazole (PROTONIX) 40 MG tablet take 1 tablet by mouth once daily 03/20/16  Yes Jayce G Cook, DO  predniSONE (DELTASONE) 10 MG tablet Take 1 tablet (10 mg total) by mouth daily with breakfast. Patient taking differently: Take 10 mg by mouth 2 (two) times daily with a meal.  09/01/16  Yes [name] Mungal, MD  SENSIPAR 30 MG tablet Take 30 mg by mouth at bedtime. Reported on 04/29/2016 12/18/14  Yes Historical Provider, MD  sevelamer carbonate (RENVELA) 800 MG tablet Take 800 mg by mouth 3 (three) times daily. And 2 tablet with snacks   Yes Historical Provider, MD  valsartan (DIOVAN) 80 MG tablet Take 80 mg by mouth at bedtime.  05/29/15  Yes Historical Provider, MD  B-D ULTRAFINE III SHORT PEN 31G X 8 MM MISC use as directed 06/07/15   Tommie SamsJayce G Cook, DO  ONE TOUCH ULTRA TEST test strip TEST BLOOD SUGAR four times a day 04/14/16   Tommie SamsJayce G Cook, DO  TRESIBA FLEXTOUCH 100 UNIT/ML SOPN FlexTouch Pen inject 40 units subcutaneously once daily 08/25/16   Reather LittlerAjay Kumar, MD    Allergies  Allergen Reactions  . Oxycodone-Acetaminophen Nausea And Vomiting  . Tape Other (See Comments)    Paper tape caused blisters    Family History  Problem Relation Age of Onset  . Diabetes Mother     died @ 4562.  [name]. Hypertension Mother   . Hypertension Maternal Grandmother     Social History Social History  Substance Use Topics  . Smoking status: Never Smoker  . Smokeless tobacco: Never Used  . Alcohol use No    Review of Systems  Constitutional: Negative for fever. Eyes: Negative for visual changes. ENT: Negative for sore throat. Cardiovascular: Negative for chest pain. Respiratory: Positive for shortness of breath. Gastrointestinal: Negative for abdominal pain, vomiting and diarrhea. Genitourinary: Negative for dysuria. Musculoskeletal: Negative for back pain. Skin: Negative for  rash. Neurological: Negative for headache. 10 point Review of Systems otherwise negative ____________________________________________   PHYSICAL EXAM:  VITAL SIGNS: ED Triage Vitals  Enc Vitals Group     BP 09/07/16 1330 (!) 167/79     Pulse Rate 09/07/16 1330 74     Resp 09/07/16 1321 20     Temp 09/07/16 1321 97.2 F (36.2 C)     Temp Source 09/07/16 1321 Oral     SpO2 09/07/16 1315 90 %     Weight 09/07/16 1322 (!) 319 lb (144.7 kg)     Height 09/07/16 1322 6\' 1"  (1.854 m)     Head Circumference --      Peak Flow --      Pain Score 09/07/16 1323 0     Pain Loc --      Pain Edu? --      Excl. in GC? --      Constitutional:  Alert and oriented. Her respiratory distress wearing BiPAP by EMS. HEENT   Head: Normocephalic and atraumatic.      Eyes: Conjunctivae are normal. PERRL. Normal extraocular movements.      Ears:         Nose: No congestion/rhinnorhea.   Mouth/Throat: Mucous membranes are moist.   Neck: No stridor. Cardiovascular/Chest: Normal rate, regular rhythm.  No murmurs, rubs, or gallops. Respiratory: No tachypnea, mild retractions. Decreased breath sounds throughout with some wheezing. Gastrointestinal: Soft. No distention, no guarding, no rebound. Nontender.  Morbid obesity.  Genitourinary/rectal:Deferred Musculoskeletal: Nontender with normal range of motion in all extremities. No joint effusions.  The most lower extremity pitting edema bilateral lower extremity.  Neurologic:  Normal speech and language. No gross or focal neurologic deficits are appreciated. Skin:  Skin is warm, dry and intact. No rash noted. Psychiatric: Mood and affect are normal. Speech and behavior are normal. Patient exhibits appropriate insight and judgment.   ____________________________________________  LABS (pertinent positives/negatives)  Labs Reviewed  CBC WITH DIFFERENTIAL/PLATELET - Abnormal; Notable for the following:       Result Value   WBC 12.4 (*)    RBC  3.57 (*)    Hemoglobin 10.5 (*)    HCT 33.2 (*)    MCHC 31.7 (*)    RDW 17.6 (*)    Neutro Abs 10.6 (*)    All other components within normal limits  BRAIN NATRIURETIC PEPTIDE - Abnormal; Notable for the following:    B Natriuretic Peptide 1,690.0 (*)    All other components within normal limits  BASIC METABOLIC PANEL - Abnormal; Notable for the following:    Potassium 5.8 (*)    Chloride 100 (*)    Glucose, Bld 240 (*)    BUN 77 (*)    Creatinine, Ser 11.96 (*)    Calcium 7.4 (*)    GFR calc non Af Amer 4 (*)    GFR calc Af Amer 5 (*)    All other components within normal limits  TROPONIN I - Abnormal; Notable for the following:    Troponin I 0.04 (*)    All other components within normal limits    ____________________________________________    EKG I, Governor Rooks, MD, the attending physician have personally viewed and interpreted all ECGs.  75 bpm. Normal sinus rhythm. Narrow QRS. Normal axis. Nonspecific ST and T-wave ____________________________________________  RADIOLOGY All Xrays were viewed by me. Imaging interpreted by Radiologist.  Chest x-ray portable:   IMPRESSION: Stable changes in the right lung base similar to that seen on prior exam. __________________________________________  PROCEDURES  Procedure(s) performed: None  Critical Care performed: CRITICAL CARE Performed by: Governor Rooks   Total critical care time: 30 minutes  Critical care time was exclusive of separately billable procedures and treating other patients.  Critical care was necessary to treat or prevent imminent or life-threatening deterioration.  Critical care was time spent personally by me on the following activities: development of treatment plan with patient and/or surrogate as well as nursing, discussions with consultants, evaluation of patient's response to treatment, examination of patient, obtaining history from patient or surrogate, ordering and performing treatments and  interventions, ordering and review of laboratory studies, ordering and review of radiographic studies, pulse oximetry and re-evaluation of patient's condition.   ____________________________________________   ED COURSE / ASSESSMENT AND PLAN  Pertinent labs & imaging results that were available during my care of the patient were reviewed by me and considered in my medical decision making (see chart  for details).   Patient arrived EMS with reported severe dyspnea and severely elevated blood pressure for which he was given nitroglycerin and placed on BiPAP, history consistent with flash pulmonary edema. Clinically the patient looks well and overloaded and is a dialysis patient has my first clinical suspicion. Patient states that he thinks it's more COPD with wheezing, and he is wheezing some and was given medications for this including DuoNeb and slight Medrol.  No pneumonia on x-ray, and no reported history of productive cough or fevers.  BUN and creatinine are elevated with a slightly elevated potassium 5.8 without EKG changes. I spoke with Dr. Thedore MinsSingh, nephrology who will consult for the patient's inpatient dialysis orders. Next line next I spoke with on-call intensivist for admission.     CONSULTATIONS:   Dr. Thedore MinsSingh, nephrology.  Intensivist for admission - patient remains on bipap.   Patient / Family / Caregiver informed of clinical course, medical decision-making process, and agree with plan.   ___________________________________________   FINAL CLINICAL IMPRESSION(S) / ED DIAGNOSES   Final diagnoses:  COPD exacerbation (HCC)  Acute pulmonary edema (HCC)  Acute renal failure, unspecified acute renal failure type (HCC)  Hyperkalemia              Note: This dictation was prepared with Dragon dictation. Any transcriptional errors that result from this process are unintentional    Governor Rooksebecca Charmel Pronovost, MD 09/07/16 [phone]

## 2016-09-07 NOTE — Progress Notes (Signed)
Patient arrived to 2A Room 238. Patient denies pain and all questions answered. Patient oriented to unit and Fall Safety Plan signed. Skin assessment completed with Hansel StarlingAdrienne RN and skin intact. A&Ox4, VSS, and NSR on verified tele-box #[id number]. Wife at bedside. Nursing staff will continue to monitor for any changes in patient status. Lamonte RicherKara A Kebrina Friend, RN

## 2016-09-07 NOTE — Progress Notes (Signed)
Subjective:   Patient known to our practice from outpatient dialysis. He states he went to his regular dialysis treatment on Friday but only 1-2 L of fluid was removed. He remained short of breath at home. He is coming for evaluation. He feels like fluid removal with dialysis is not able to resolve his shortness of breath. He denies any cough, sputum, hemoptysis. No blood in the urine. No blood in the stool. Continues to have lower extremity edema which is chronic. Requiring oxygen by nasal cannula.  Objective:  Vital signs in last 24 hours:  Temp:  [97.2 F (36.2 C)-98.1 F (36.7 C)] 98.1 F (36.7 C) (11/26 1735) Pulse Rate:  [65-74] 69 (11/26 1740) Resp:  [14-21] 16 (11/26 1740) BP: (145-177)/(78-91) 174/81 (11/26 1740) SpO2:  [90 %-100 %] 100 % (11/26 1740) Weight:  [144.7 kg (319 lb)] 144.7 kg (319 lb) (11/26 1322)  Weight change:  Filed Weights   09/07/16 1322  Weight: (!) 144.7 kg (319 lb)    Intake/Output:   No intake or output data in the 24 hours ending 09/07/16 1753   Physical Exam: General: No acute distress, sitting up in bed   HEENT Anicteric, moist oral mucous membranes   Neck Supple   Pulm/lungs Bibasilar crackles, nasal cannula oxygen   CVS/Heart Regular, no rub or gallop   Abdomen:  Soft, nontender, nondistended, obese   Extremities: 2+ pitting edema   Neurologic: Alert, oriented   Skin: No acute rashes   Access: Upper arm AV fistula - Left       Basic Metabolic Panel:   Recent Labs Lab 09/07/16 1327  NA 141  K 5.8*  CL 100*  CO2 28  GLUCOSE 240*  BUN 77*  CREATININE 11.96*  CALCIUM 7.4*     CBC:  Recent Labs Lab 09/07/16 1327  WBC 12.4*  NEUTROABS 10.6*  HGB 10.5*  HCT 33.2*  MCV 93.1  PLT 370      Microbiology:  Recent Results (from the past 720 hour(s))  MRSA PCR Screening     Status: None   Collection Time: 08/29/16  3:45 AM  Result Value Ref Range Status   MRSA by PCR NEGATIVE NEGATIVE Final    Comment:         The GeneXpert MRSA Assay (FDA approved for NASAL specimens only), is one component of a comprehensive MRSA colonization surveillance program. It is not intended to diagnose MRSA infection nor to guide or monitor treatment for MRSA infections.     Coagulation Studies: No results for input(s): LABPROT, INR in the last 72 hours.  Urinalysis: No results for input(s): COLORURINE, LABSPEC, PHURINE, GLUCOSEU, HGBUR, BILIRUBINUR, KETONESUR, PROTEINUR, UROBILINOGEN, NITRITE, LEUKOCYTESUR in the last 72 hours.  Invalid input(s): APPERANCEUR    Imaging: Dg Chest Portable 1 View  Result Date: 09/07/2016 CLINICAL DATA:  Respiratory distress EXAM: PORTABLE CHEST 1 VIEW COMPARISON:  08/28/2016 FINDINGS: Cardiac shadow is mildly enlarged but stable. Patchy changes are again seen in the right lung base stable from the prior exam. No new focal infiltrate or sizable effusion is noted. Mild central vascular congestion remains. No osseous abnormality is seen. IMPRESSION: Stable changes in the right lung base similar to that seen on prior exam. Electronically Signed   By: Alcide CleverMark  Lukens M.D.   On: 09/07/2016 14:07     Medications:    . [START ON 09/08/2016] insulin aspart  0-20 Units Subcutaneous TID WC  . insulin aspart  0-5 Units Subcutaneous QHS  . ipratropium-albuterol  3 mL  Nebulization Q6H  . methylPREDNISolone (SOLU-MEDROL) injection  40 mg Intravenous Q6H   albuterol, lidocaine-prilocaine  Assessment/ Plan:  55 y.o.African-American male  Diabetes mellitus type 1, hypertension, hyperlipidemia, gout, CHF, hypothyroidism, Vitamin D deficiency, peripheral neuropathy, diabetic retinopathy and obstructive sleep apnea on CPAP, anemia of CKD, SHPTH, BOOP 4/16. First dialysis 10/2012  MWF [facility] [address]   1. End Stage Renal Disease: MWF AVF Estimated dry weight 144.5 kg Dialysis today for shortness of breath and pulmonary edema Continue MWF schedule - Ultrafiltration goal of 3-4 kg  as tolerated  2. Shortness of breath - Reevaluate after volume removal with dialysis - Chest x-ray in a.m. -  if shortness of breath is not resolved, may need pulmonary evaluation  3. Anemia of chronic kidney disease: hemoglobin 10.5 - Low dose EPO with dialysis  4. Secondary Hyperparathyroidism: Monitor phosphorus during admission - Sevelamer, Sensipar    LOS: 0 Holland Nickson 11/26/20175:53 PM

## 2016-09-07 NOTE — Progress Notes (Signed)
HD completed without issue. 4L goal met. Patient tolerated well. Report called to Jake Samples. Pt will be transferring to room 238.

## 2016-09-07 NOTE — ED Notes (Addendum)
Patient O2 decreased to 3 Liter via [place], will continue to monitor O2 sats.

## 2016-09-07 NOTE — ED Notes (Signed)
Dr. Linward Natalamachandra at bedside. Patient taken off bi-pap and placed on 6 Liter via [place]. Will continue to monitor sats to see if patient able to tolerate off Bi-pap

## 2016-09-07 NOTE — Progress Notes (Signed)
Pre dialysis  

## 2016-09-07 NOTE — ED Triage Notes (Signed)
[facility]. Patient has a hx/o COPD and CKD. Per EMS patients initial O2 sat was 90% on 4 liters Patient brought in by EMS on Bi-pap. Patient had 1.5 inch of Nitro paste placed by EMS on left chest

## 2016-09-07 NOTE — Progress Notes (Signed)
Pharmacy note  Lovenox changed to Heparin SQ 5000 units q 12 hours for CrCl <15 mL/min per policy.

## 2016-09-07 NOTE — ED Notes (Signed)
Per Dialysis nurse do not give patient Kayexalate.

## 2016-09-08 ENCOUNTER — Inpatient Hospital Stay: Payer: Medicare Other

## 2016-09-08 ENCOUNTER — Encounter: Payer: Self-pay | Admitting: Radiology

## 2016-09-08 DIAGNOSIS — G44009 Cluster headache syndrome, unspecified, not intractable: Secondary | ICD-10-CM

## 2016-09-08 LAB — CBC
HCT: 34.9 % — ABNORMAL LOW (ref 40.0–52.0)
HEMOGLOBIN: 11 g/dL — AB (ref 13.0–18.0)
MCH: 29.6 pg (ref 26.0–34.0)
MCHC: 31.6 g/dL — ABNORMAL LOW (ref 32.0–36.0)
MCV: 93.7 fL (ref 80.0–100.0)
PLATELETS: 357 10*3/uL (ref 150–440)
RBC: 3.73 MIL/uL — AB (ref 4.40–5.90)
RDW: 18.1 % — ABNORMAL HIGH (ref 11.5–14.5)
WBC: 12.6 10*3/uL — AB (ref 3.8–10.6)

## 2016-09-08 LAB — BASIC METABOLIC PANEL
ANION GAP: 13 (ref 5–15)
BUN: 56 mg/dL — ABNORMAL HIGH (ref 6–20)
CHLORIDE: 97 mmol/L — AB (ref 101–111)
CO2: 24 mmol/L (ref 22–32)
Calcium: 7.8 mg/dL — ABNORMAL LOW (ref 8.9–10.3)
Creatinine, Ser: 8.7 mg/dL — ABNORMAL HIGH (ref 0.61–1.24)
GFR calc Af Amer: 7 mL/min — ABNORMAL LOW (ref >60)
GFR, EST NON AFRICAN AMERICAN: 6 mL/min — AB (ref >60)
Glucose, Bld: 536 mg/dL (ref 65–99)
POTASSIUM: 7 mmol/L — AB (ref 3.5–5.1)
SODIUM: 134 mmol/L — AB (ref 135–145)

## 2016-09-08 LAB — C-REACTIVE PROTEIN: CRP: 1.5 mg/dL — ABNORMAL HIGH (ref <1.0)

## 2016-09-08 LAB — PHOSPHORUS: Phosphorus: 5.6 mg/dL — ABNORMAL HIGH (ref 2.5–4.6)

## 2016-09-08 LAB — GLUCOSE, CAPILLARY
GLUCOSE-CAPILLARY: 507 mg/dL — AB (ref 65–99)
GLUCOSE-CAPILLARY: 411 mg/dL — AB (ref 65–99)
GLUCOSE-CAPILLARY: 445 mg/dL — AB (ref 65–99)
Glucose-Capillary: 459 mg/dL — ABNORMAL HIGH (ref 65–99)
Glucose-Capillary: 172 mg/dL — ABNORMAL HIGH (ref 65–99)
Glucose-Capillary: 56 mg/dL — ABNORMAL LOW (ref 65–99)
Glucose-Capillary: 75 mg/dL (ref 65–99)
Glucose-Capillary: 101 mg/dL — ABNORMAL HIGH (ref 65–99)

## 2016-09-08 LAB — TROPONIN I
TROPONIN I: 0.05 ng/mL — AB (ref <0.03)
Troponin I: 0.05 ng/mL (ref <0.03)

## 2016-09-08 LAB — SEDIMENTATION RATE: SED RATE: 58 mm/h — AB (ref 0–20)

## 2016-09-08 IMAGING — CT CT ANGIO CHEST
2 of 6 series · 18 of 36 positions shown · IV contrast (omnipaque)
Comparison: Current chest radiograph.  Chest CT, 12/13/2014.

CLINICAL DATA: SOB x approx. 1 mo, pt states he has been diagnosed
with pneumonia x3, in the last 3 weeks. HX: Diabetes, CHF, CAD,
Chronic Kidney disease- pt states he has been on dialysis for
approx. 2 years, GERD, HTN, GB removed.

EXAM:
CT ANGIOGRAPHY CHEST WITH CONTRAST
TECHNIQUE: Multidetector CT imaging of the chest was performed using the
standard protocol during bolus administration of intravenous
contrast. Multiplanar CT image reconstructions and MIPs were
obtained to evaluate the vascular anatomy.
CONTRAST:  100 mL of Omnipaque 350 intravenous contrast

[Series 5: pe 1.0 thins · axial · 0.82mm/px · z∈[-568,-300]mm · 17 of 302 slices shown]
[im 17/302  lung]
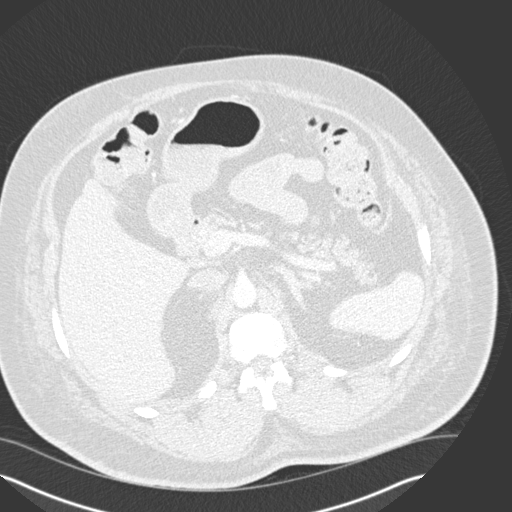
[im 34/302  mediastinal]
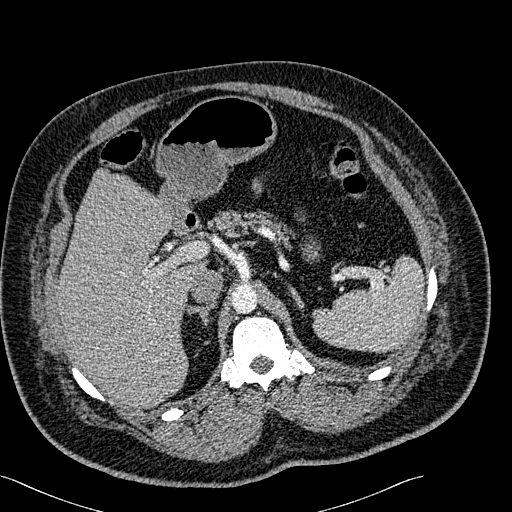
[im 51/302  lung]
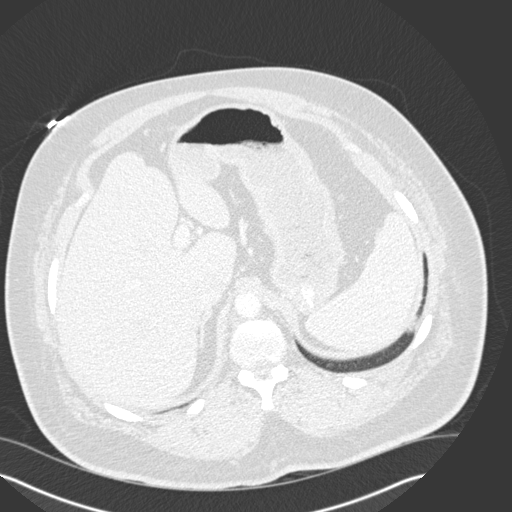
[im 67/302  mediastinal]
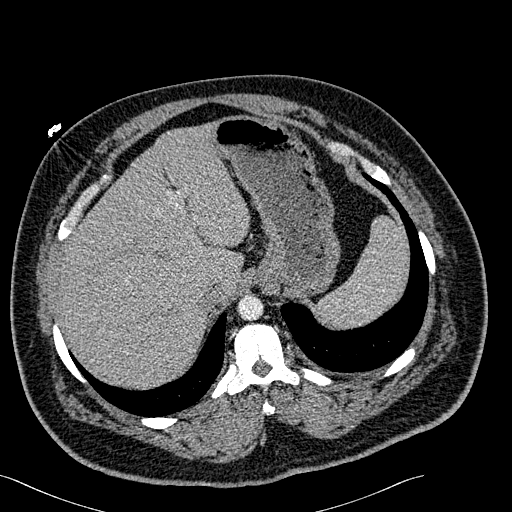
[im 84/302  lung]
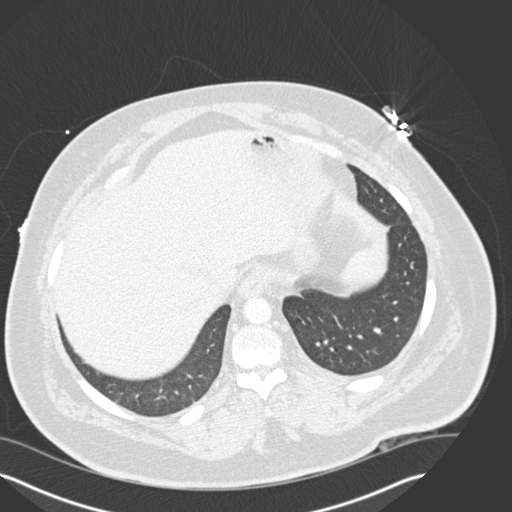
[im 101/302  mediastinal]
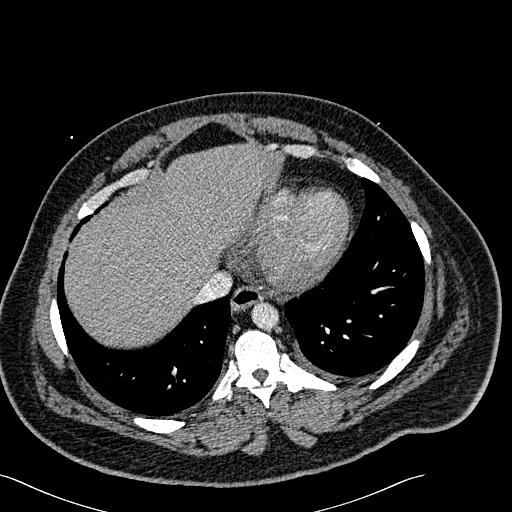
[im 118/302  lung]
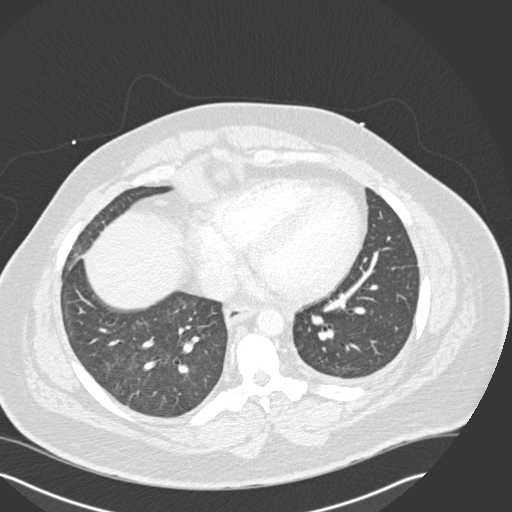
[im 134/302  mediastinal]
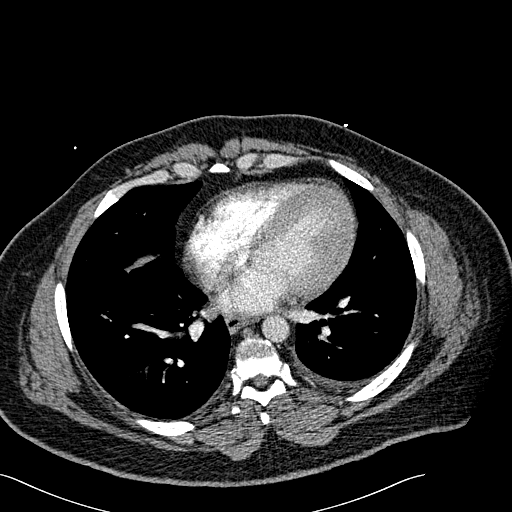
[im 151/302  lung]
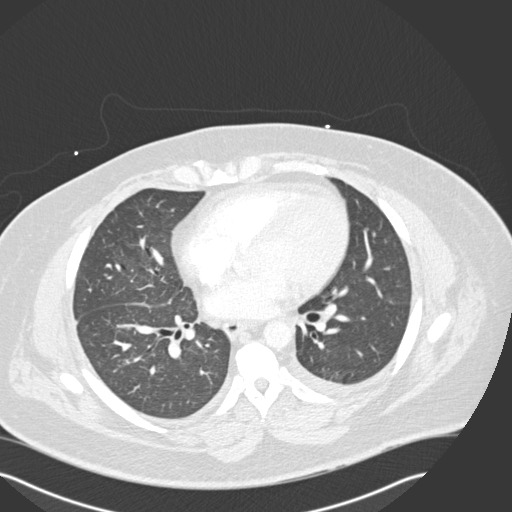
[im 168/302  mediastinal]
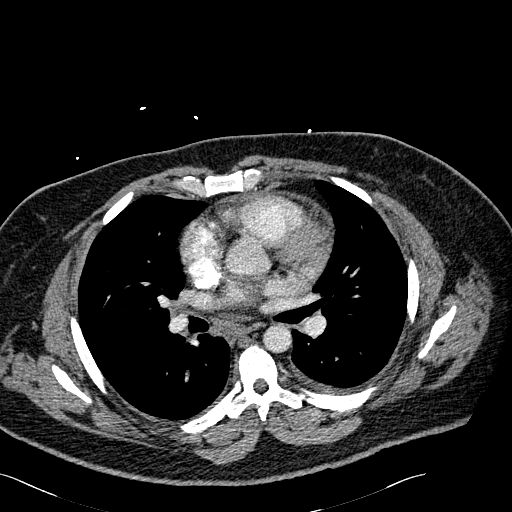
[im 184/302  lung]
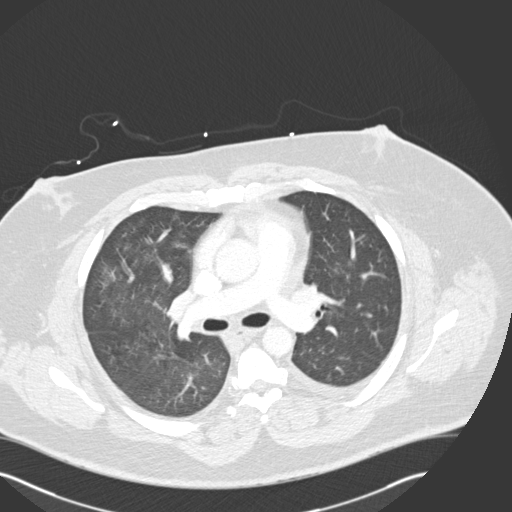
[im 201/302  mediastinal]
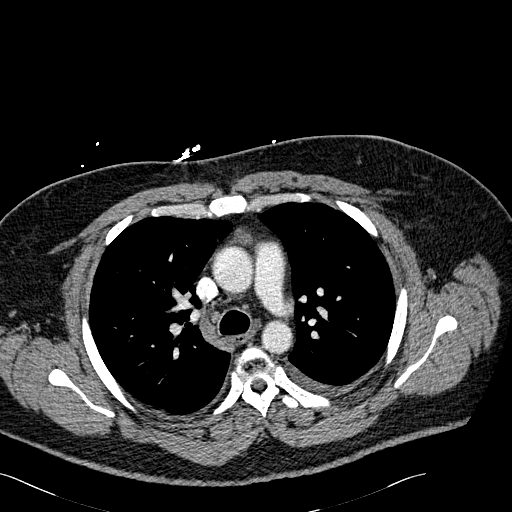
[im 218/302  lung]
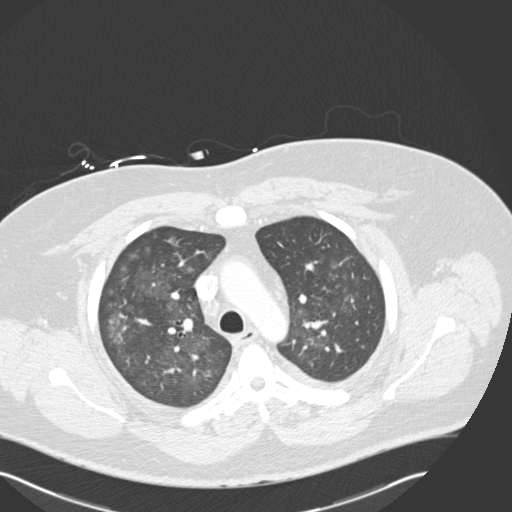
[im 235/302  mediastinal]
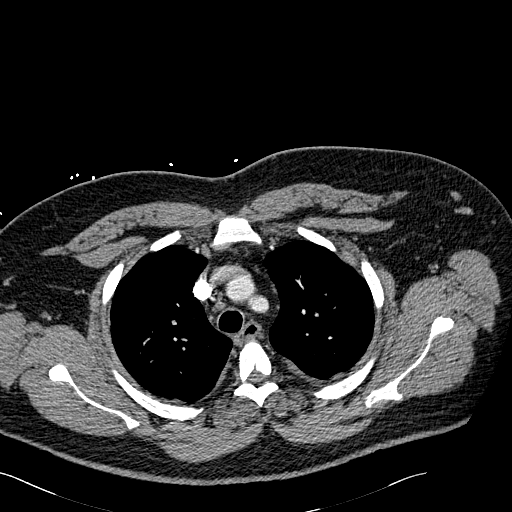
[im 251/302  lung]
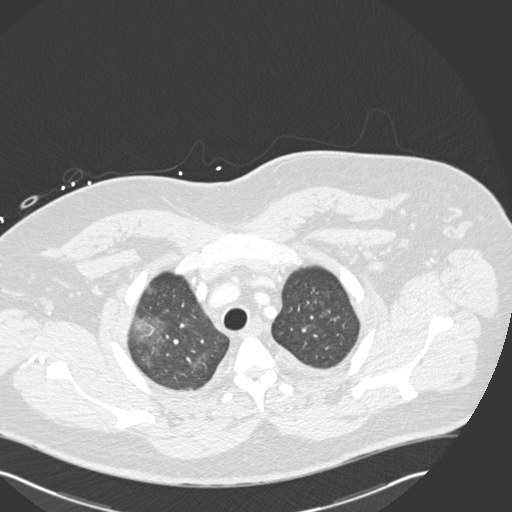
[im 268/302  mediastinal]
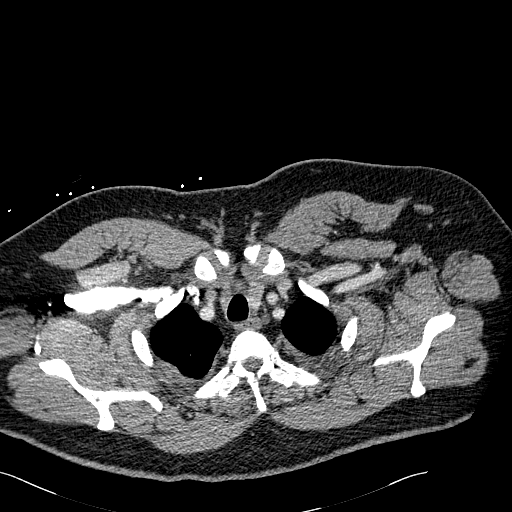
[im 285/302  lung]
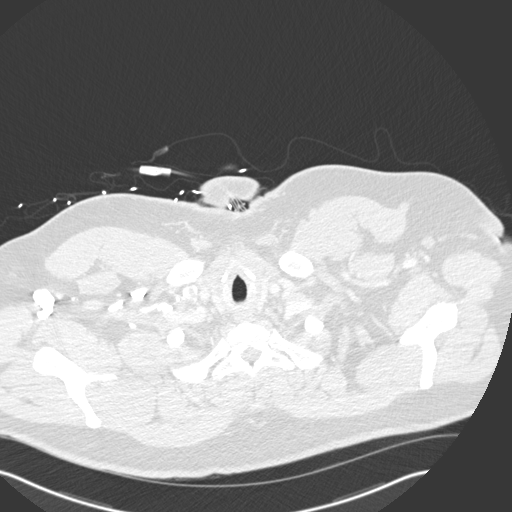

[Series 7: cor pe 2.0 mpr · coronal · 0.57mm/px · 1 of 168 slices shown]
[im 84/168  mediastinal]
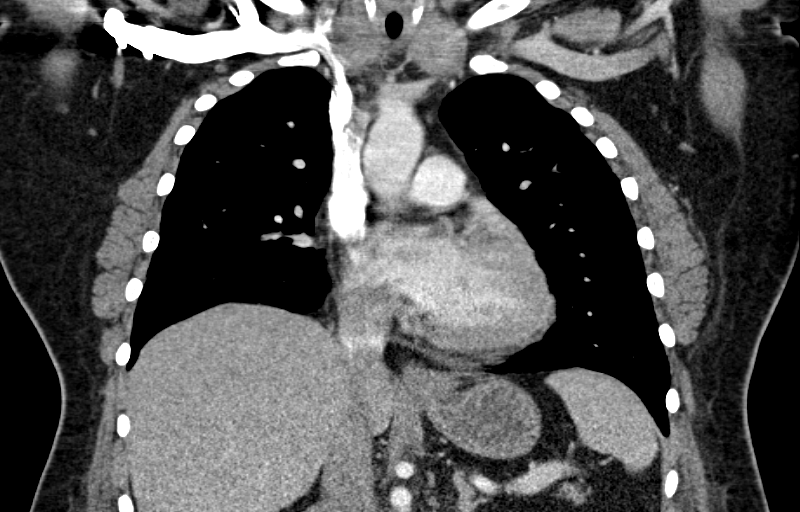

[18 of 36 positions shown; findings below may reference images not displayed]

FINDINGS: Angiographic study: No evidence of a pulmonary embolus. No aortic
dissection.

Mediastinum and hila: Heart is normal in size and configuration.
Great vessels normal in caliber. No mediastinal or hilar masses.
Mildly enlarged right subcarinal lymph node measuring 15 mm in short
axis. No other enlarged lymph nodes. There are scattered sub cm
nodes. No hilar adenopathy.

Lungs and pleural: Patchy areas of hazy ground-glass opacity are
noted mostly in the upper lobes, right greater than left. Minimal
left pleural effusion. No lung mass or suspicious nodule. No
pneumothorax.

Limited upper abdomen: Status post cholecystectomy. No acute
findings.

Musculoskeletal:  No significant abnormality.

Review of the MIP images confirms the above findings.
IMPRESSION: 1. No evidence of a pulmonary embolus.
2. Patchy areas of ground-glass opacity in both lungs, predominating
in the upper lobes, right greater than left. Associated minimal left
pleural effusion. No convincing pulmonary edema. Findings consistent
with multifocal pneumonia.

## 2016-09-08 MED ORDER — LORAZEPAM BOLUS VIA INFUSION: 2.0000 mg | Freq: Once | INTRAVENOUS | Status: DC

## 2016-09-08 MED ORDER — INSULIN GLARGINE 100 UNIT/ML ~~LOC~~ SOLN
30.0000 [IU] | Freq: Every day | SUBCUTANEOUS | Status: DC
  Administered 2016-09-08 – 2016-09-09 (×2): 30 [IU] via SUBCUTANEOUS
  Filled 2016-09-08 (×2): qty 0.3

## 2016-09-08 MED ORDER — IRBESARTAN 150 MG PO TABS
300.0000 mg | ORAL_TABLET | Freq: Every day | ORAL | Status: DC
  Administered 2016-09-09 – 2016-09-11 (×2): 300 mg via ORAL
  Filled 2016-09-08 (×2): qty 2

## 2016-09-08 MED ORDER — INSULIN ASPART 100 UNIT/ML ~~LOC~~ SOLN
14.0000 [IU] | Freq: Once | SUBCUTANEOUS | Status: AC
  Administered 2016-09-08: 14 [IU] via SUBCUTANEOUS
  Filled 2016-09-08: qty 14

## 2016-09-08 MED ORDER — METHYLPREDNISOLONE SODIUM SUCC 40 MG IJ SOLR
40.0000 mg | Freq: Two times a day (BID) | INTRAMUSCULAR | Status: DC
  Administered 2016-09-08 – 2016-09-09 (×2): 40 mg via INTRAVENOUS
  Filled 2016-09-08 (×2): qty 1

## 2016-09-08 MED ORDER — IOPAMIDOL (ISOVUE-370) INJECTION 76%
75.0000 mL | Freq: Once | INTRAVENOUS | Status: AC | PRN
  Administered 2016-09-08: 75 mL via INTRAVENOUS

## 2016-09-08 MED ORDER — LORAZEPAM 2 MG/ML IJ SOLN
INTRAMUSCULAR | Status: AC
  Administered 2016-09-08: 16:00:00
  Filled 2016-09-08: qty 1

## 2016-09-08 MED ORDER — INSULIN ASPART 100 UNIT/ML ~~LOC~~ SOLN
20.0000 [IU] | SUBCUTANEOUS | Status: DC | PRN
  Administered 2016-09-08: 20 [IU] via SUBCUTANEOUS
  Filled 2016-09-08: qty 20

## 2016-09-08 MED ORDER — HEPARIN SODIUM (PORCINE) 5000 UNIT/ML IJ SOLN
5000.0000 [IU] | Freq: Three times a day (TID) | INTRAMUSCULAR | Status: DC
  Administered 2016-09-08 – 2016-09-11 (×7): 5000 [IU] via SUBCUTANEOUS
  Filled 2016-09-08 (×7): qty 1

## 2016-09-08 MED ORDER — SODIUM POLYSTYRENE SULFONATE 15 GM/60ML PO SUSP
60.0000 g | Freq: Once | ORAL | Status: AC
  Administered 2016-09-08: 60 g via ORAL
  Filled 2016-09-08: qty 240

## 2016-09-08 MED ORDER — LORAZEPAM 2 MG/ML IJ SOLN
1.0000 mg | Freq: Once | INTRAMUSCULAR | Status: AC
  Administered 2016-09-08: 1 mg via INTRAVENOUS

## 2016-09-08 MED ORDER — LEVOFLOXACIN IN D5W 500 MG/100ML IV SOLN
500.0000 mg | Freq: Once | INTRAVENOUS | Status: DC
  Filled 2016-09-08: qty 100

## 2016-09-08 MED ORDER — LORAZEPAM 2 MG/ML IJ SOLN
2.0000 mg | Freq: Once | INTRAMUSCULAR | Status: AC
  Administered 2016-09-08: 2 mg via INTRAVENOUS
  Filled 2016-09-08: qty 1

## 2016-09-08 MED ORDER — LEVOFLOXACIN 500 MG PO TABS
250.0000 mg | ORAL_TABLET | ORAL | Status: DC
  Administered 2016-09-09: 250 mg via ORAL
  Filled 2016-09-08: qty 1

## 2016-09-08 NOTE — Progress Notes (Signed)
Notified MD Pyreddy of critical potassium 7.0 and blood glucose 536. 60g of kayexalate and 20 units of insulin to be given per MD order. Patient is A&Ox4 and in no acute distress. Nursing staff will continue to monitor for any changes in patient status. Lamonte RicherKara A Shaely Gadberry, RN

## 2016-09-08 NOTE — Progress Notes (Signed)
Pharmacy Antibiotic Note  Ryan FellsMyron W Arnold is a 55 y.o. male admitted on 09/07/2016 with Acute respiratory failure and recurrent PNA.  Pharmacy has been consulted for levofloxacin dosing. Patient has ESRD w/HD MWF  Plan: Will start the patient on levofloxacin 500mg  IV x1 dose, followed by levofloxacin 250mg  PO every 48 hours.   Height: 6\' 1"  (185.4 cm) Weight: (!) 311 lb (141.1 kg) IBW/kg (Calculated) : 79.9  Temp (24hrs), Avg:97.7 F (36.5 C), Min:97.2 F (36.2 C), Max:98.1 F (36.7 C)   Recent Labs Lab 09/07/16 1327 09/08/16 0348  WBC 12.4* 12.6*  CREATININE 11.96* 8.70*    Estimated Creatinine Clearance: 14.5 mL/min (by C-G formula based on SCr of 8.7 mg/dL (H)).    Allergies  Allergen Reactions  . Oxycodone-Acetaminophen Nausea And Vomiting  . Tape Other (See Comments)    Paper tape caused blisters    Antimicrobials this admission: 11/27 levofloxacin >>   Dose adjustments this admission:   Microbiology results:   Thank you for allowing pharmacy to be a part of this patient's care.  Gardner CandleSheema M Aronda Burford, PharmD, BCPS Clinical Pharmacist 09/08/2016 9:51 AM

## 2016-09-08 NOTE — Care Management (Signed)
patient admitted for shortness of breath due to fluid overload.  Initially required bipap in the ED.  [facility] [address] [facility]  Verbalizes compliance to HD regime.  Notified Susann Givensheryl Brawner at [facility] of admission.  Faxed information

## 2016-09-08 NOTE — Progress Notes (Signed)
Subjective:  Patient had hemodialysis yesterday. Despite this still having some shortness of breath particularly with exertion. 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 Dema SeverinMungal.   Also has underlying obstructive sleep apnea and is on BiPAP at night. Patient due for hemodialysis again today.  Objective:  Vital signs in last 24 hours:  Temp:  [97.5 F (36.4 C)-98.1 F (36.7 C)] 98 F (36.7 C) (11/27 1139) Pulse Rate:  [65-76] 76 (11/27 1139) Resp:  [14-18] 14 (11/27 1139) BP: (137-187)/(69-97) 147/69 (11/27 1139) SpO2:  [91 %-100 %] 100 % (11/27 1414) FiO2 (%):  [32 %] 32 % (11/27 1414) Weight:  [140.4 kg (309 lb 9.6 oz)-141.1 kg (311 lb)] 141.1 kg (311 lb) (11/27 0325)  Weight change:  Filed Weights   09/07/16 1322 09/07/16 2145 09/08/16 0325  Weight: (!) 144.7 kg (319 lb) (!) 140.4 kg (309 lb 9.6 oz) (!) 141.1 kg (311 lb)    Intake/Output:    Intake/Output Summary (Last 24 hours) at 09/08/16 1517 Last data filed at 09/08/16 1425  Gross per 24 hour  Intake              480 ml  Output             4253 ml  Net            -3773 ml     Physical Exam: General: No acute distress, sitting up in bed   HEENT Anicteric, moist oral mucous membranes   Neck Supple   Pulm/lungs Right greater than left basilar rales, nasal cannula oxygen   CVS/Heart Regular, no rub or gallop   Abdomen:  Soft, nontender, nondistended, obese   Extremities: 2+ pitting edema   Neurologic: Alert, oriented, follows commands  Skin: No acute rashes   Access: Upper arm AV fistula - Left       Basic Metabolic Panel:   Recent Labs Lab 09/07/16 1327 09/08/16 0348 09/08/16 1054  NA 141 134*  --   K 5.8* 7.0*  --   CL 100* 97*  --   CO2 28 24  --   GLUCOSE 240* 536*  --   BUN 77* 56*  --   CREATININE 11.96* 8.70*  --   CALCIUM 7.4* 7.8*  --   PHOS  --   --  5.6*     CBC:  Recent Labs Lab 09/07/16 1327 09/08/16 0348  WBC 12.4* 12.6*  NEUTROABS 10.6*  --   HGB 10.5* 11.0*  HCT 33.2* 34.9*   MCV 93.1 93.7  PLT 370 357      Microbiology:  Recent Results (from the past 720 hour(s))  MRSA PCR Screening     Status: None   Collection Time: 08/29/16  3:45 AM  Result Value Ref Range Status   MRSA by PCR NEGATIVE NEGATIVE Final    Comment:        The GeneXpert MRSA Assay (FDA approved for NASAL specimens only), is one component of a comprehensive MRSA colonization surveillance program. It is not intended to diagnose MRSA infection nor to guide or monitor treatment for MRSA infections.     Coagulation Studies: No results for input(s): LABPROT, INR in the last 72 hours.  Urinalysis: No results for input(s): COLORURINE, LABSPEC, PHURINE, GLUCOSEU, HGBUR, BILIRUBINUR, KETONESUR, PROTEINUR, UROBILINOGEN, NITRITE, LEUKOCYTESUR in the last 72 hours.  Invalid input(s): APPERANCEUR    Imaging: Dg Chest 2 View  Result Date: 09/08/2016 CLINICAL DATA:  Shortness of breath EXAM: CHEST  2 VIEW COMPARISON:  Portable chest  x-ray of September 07, 2016 FINDINGS: The left lung is adequately inflated and clear. On the right there is mild volume loss. A small amount of pleural fluid is present. The interstitial markings in the right mid and lower lung remain increased but have improved. The cardiac silhouette remains enlarged. The central pulmonary vascularity is less engorged. The bony thorax exhibits no acute abnormality. IMPRESSION: Slight interval improvement in right lower lobe pneumonia. Persistent small right pleural effusion. Low-grade CHF. Electronically Signed   By: David  SwazilandJordan M.D.   On: 09/08/2016 09:11   Dg Chest Portable 1 View  Result Date: 09/07/2016 CLINICAL DATA:  Respiratory distress EXAM: PORTABLE CHEST 1 VIEW COMPARISON:  08/28/2016 FINDINGS: Cardiac shadow is mildly enlarged but stable. Patchy changes are again seen in the right lung base stable from the prior exam. No new focal infiltrate or sizable effusion is noted. Mild central vascular congestion remains. No  osseous abnormality is seen. IMPRESSION: Stable changes in the right lung base similar to that seen on prior exam. Electronically Signed   By: Alcide CleverMark  Lukens M.D.   On: 09/07/2016 14:07     Medications:    . aspirin EC  81 mg Oral Daily  . atorvastatin  40 mg Oral Daily  . cinacalcet  30 mg Oral QHS  . DULoxetine  120 mg Oral Daily  . epoetin (EPOGEN/PROCRIT) injection  4,000 Units Intravenous Q M,W,F-HD  . furosemide  80 mg Oral Daily  . gabapentin  300 mg Oral TID  . heparin subcutaneous  5,000 Units Subcutaneous Q8H  . insulin aspart  0-20 Units Subcutaneous TID WC  . insulin aspart  0-5 Units Subcutaneous QHS  . insulin aspart  40 Units Subcutaneous TID WC  . insulin glargine  30 Units Subcutaneous Daily  . ipratropium-albuterol  3 mL Nebulization TID  . [START ON 09/09/2016] irbesartan  300 mg Oral Daily  . levofloxacin (LEVAQUIN) IV  500 mg Intravenous Once   Followed by  . [START ON 09/09/2016] levofloxacin  250 mg Oral Q48H  . mouth rinse  15 mL Mouth Rinse BID  . methylPREDNISolone (SOLU-MEDROL) injection  40 mg Intravenous Q12H  . metoprolol  50 mg Oral BID  . multivitamin  1 tablet Oral Daily  . pantoprazole  40 mg Oral Daily  . sevelamer carbonate  800 mg Oral TID  . sodium chloride flush  3 mL Intravenous Q12H  . 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 First dialysis 10/2012  MWF [facility] [address]   1. End Stage Renal Disease: MWF AVF Estimated dry weight 144.5 kg - patient had hemodialysis yesterday but we plan to perform dialysis again today to get him back on his usual schedule.  Still having shortness of breath.  2. Shortness of breath with hx of  BOOP/OSA - patient appears to be breathing, probably at the moment.  He continues to have shortness of breath with exertion.  Agree with pulmonary consultation.  Recommend consideration of 2-D echocardiogram as well.  3. Anemia of chronic kidney disease: hemoglobin 11.0.continue Epogen 4000 units IV with dialysis.  4. Secondary Hyperparathyroidism: Monitor phosphorus during admission - Sevelamer, Sensipar.  Phosphorus closely target at 5.6.    LOS: 1 Katianne Barre 11/27/20173:17 PM

## 2016-09-08 NOTE — Progress Notes (Signed)
Pre hd info 

## 2016-09-08 NOTE — Consult Note (Signed)
Reason for Consult:Headaches Referring Physician: Mody  CC: Headaches  HPI: Ryan Arnold is an 55 y.o. male admitted with SOB.  Patient also reports that he has had headaches for the past 2 years.  They are sharp and radiate along the right side of the head from the back to the temple.  Pain usually lasts a few seconds then spontaneously resolve.  There is no associated nausea, vomiting, photophobia or phonophobia. He was having them about once a week on the Neurontin.  It was increased about 2-3 months ago but he continues to have the headaches and reports they are now at 2-4 times per week.  He is now on 627m TID of Neurontin. Patient did have headaches as a teenager but these headaches are different.      Past Medical History:  Diagnosis Date  . Anemia of chronic disease   . 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 01/2018 s/p R thoracoscopy/Bx confirming BOOP;  b. 02/2018 high dose steroids started.  . Chest pain    a. 06/2014 Myoview ([facility]) no ischemia/infarct, nl EF.  .Marland KitchenChronic diastolic CHF (congestive heart failure) ([place]    a. 12/2014 Echo: EF 50-55%, mild conc LVH, mildly dil LA.  . End stage renal disease ([place]    a. on dialysis; still makes urine.  . Essential hypertension   . Hyperlipemia   . Morbid obesity ([place]   . OSA treated with BiPAP   . Recurrent pneumonia    a. 2/2 BOOP.  .Mar[place] KitchenSecondary hyperparathyroidism ([place]   . Thyroid disease   . Type 1 diabetes mellitus ([place]     Past Surgical History:  Procedure Laterality Date  . cataract Bilateral   . GALLBLADDER SURGERY    . KNEE SURGERY Right   . LUNG BIOPSY    . SHOULDER SURGERY Left     Family History  Problem Relation Age of Onset  . Diabetes Mother     died @ 672  .Marland KitchenHypertension Mother   . Hypertension Maternal Grandmother     Social History:  reports that he has never smoked. He has never used smokeless tobacco. He reports that he does not drink alcohol or use  drugs.  Allergies  Allergen Reactions  . Oxycodone-Acetaminophen Nausea And Vomiting  . Tape Other (See Comments)    Paper tape caused blisters    Medications:  I have reviewed the patient's current medications. Prior to Admission:  Prescriptions Prior to Admission  Medication Sig Dispense Refill Last Dose  . albuterol (PROVENTIL HFA) 108 (90 Base) MCG/ACT inhaler Inhale 2 puffs into the lungs every 6 (six) hours as needed for wheezing or shortness of breath. 1 Inhaler 0 prn at prn  . aspirin EC 81 MG tablet Take 81 mg by mouth daily.   09/07/2016 at 0800  . atorvastatin (LIPITOR) 40 MG tablet Take 40 mg by mouth daily.   09/07/2016 at 0800  . DULoxetine (CYMBALTA) 60 MG capsule Take 2 capsules (120 mg total) by mouth daily. 90 capsule 2 09/07/2016 at 0800  . furosemide (LASIX) 80 MG tablet Take 1 tablet (80 mg total) by mouth daily. 30 tablet 2 09/07/2016 at 0800  . gabapentin (NEURONTIN) 300 MG capsule take 1 capsule by mouth three times a day 90 capsule 1 09/07/2016 at 0800  . insulin aspart (NOVOLOG FLEXPEN) 100 UNIT/ML FlexPen Inject 40 Units into the skin 3 (three) times daily with meals.   09/07/2016 at 0800  . metoprolol (LOPRESSOR)  50 MG tablet Take 50 mg by mouth 2 (two) times daily.   09/07/2016 at 0800  . multivitamin (RENA-VIT) TABS tablet Take 1 tablet by mouth daily.   09/07/2016 at 0800  . pantoprazole (PROTONIX) 40 MG tablet take 1 tablet by mouth once daily 90 tablet 3 09/07/2016 at 0800  . predniSONE (DELTASONE) 10 MG tablet Take 1 tablet (10 mg total) by mouth daily with breakfast. (Patient taking differently: Take 10 mg by mouth 2 (two) times daily with a meal. ) 30 tablet 0 09/07/2016 at 0800  . SENSIPAR 30 MG tablet Take 30 mg by mouth at bedtime. Reported on 04/29/2016   09/06/2016 at 2100  . sevelamer carbonate (RENVELA) 800 MG tablet Take 800 mg by mouth 3 (three) times daily. And 2 tablet with snacks   09/07/2016 at 0800  . valsartan (DIOVAN) 80 MG tablet Take 80  mg by mouth at bedtime.   0 09/06/2016 at 2100  . B-D ULTRAFINE III SHORT PEN 31G X 8 MM MISC use as directed 100 each 11 08/28/2016 at Unknown time  . ONE TOUCH ULTRA TEST test strip TEST BLOOD SUGAR four times a day 100 each 3 08/28/2016 at Unknown time  . TRESIBA FLEXTOUCH 100 UNIT/ML SOPN FlexTouch Pen inject 40 units subcutaneously once daily 15 mL 2 08/28/2016 at Unknown time   Scheduled: . aspirin EC  81 mg Oral Daily  . atorvastatin  40 mg Oral Daily  . cinacalcet  30 mg Oral QHS  . DULoxetine  120 mg Oral Daily  . epoetin (EPOGEN/PROCRIT) injection  4,000 Units Intravenous Q M,W,F-HD  . furosemide  80 mg Oral Daily  . gabapentin  300 mg Oral TID  . heparin subcutaneous  5,000 Units Subcutaneous Q12H  . insulin aspart  0-20 Units Subcutaneous TID WC  . insulin aspart  0-5 Units Subcutaneous QHS  . insulin aspart  40 Units Subcutaneous TID WC  . insulin glargine  30 Units Subcutaneous Daily  . ipratropium-albuterol  3 mL Nebulization TID  . [START ON 09/09/2016] irbesartan  300 mg Oral Daily  . levofloxacin (LEVAQUIN) IV  500 mg Intravenous Once   Followed by  . [START ON 09/09/2016] levofloxacin  250 mg Oral Q48H  . LORazepam  2 mg Intravenous Once  . mouth rinse  15 mL Mouth Rinse BID  . methylPREDNISolone (SOLU-MEDROL) injection  40 mg Intravenous Q12H  . metoprolol  50 mg Oral BID  . multivitamin  1 tablet Oral Daily  . pantoprazole  40 mg Oral Daily  . sevelamer carbonate  800 mg Oral TID  . sodium chloride flush  3 mL Intravenous Q12H  . thiamine  100 mg Oral Daily    ROS: History obtained from the patient  General ROS: negative for - chills, fatigue, fever, night sweats, weight gain or weight loss Psychological ROS: negative for - behavioral disorder, hallucinations, memory difficulties, mood swings or suicidal ideation Ophthalmic ROS: blurry vision ENT ROS: negative for - epistaxis, nasal discharge, oral lesions, sore throat, tinnitus or vertigo Allergy and  Immunology ROS: negative for - hives or itchy/watery eyes Hematological and Lymphatic ROS: negative for - bleeding problems, bruising or swollen lymph nodes Endocrine ROS: negative for - galactorrhea, hair pattern changes, polydipsia/polyuria or temperature intolerance Respiratory ROS: negative for - cough, hemoptysis, shortness of breath or wheezing Cardiovascular ROS: negative for - chest pain, dyspnea on exertion, edema or irregular heartbeat Gastrointestinal ROS: negative for - abdominal pain, diarrhea, hematemesis, nausea/vomiting or stool incontinence Genito-Urinary ROS:  negative for - dysuria, hematuria, incontinence or urinary frequency/urgency Musculoskeletal ROS: soreness from fall Neurological ROS: as noted in HPI, numbness in feet Dermatological ROS: negative for rash and skin lesion changes  Physical Examination: Blood pressure (!) 147/69, pulse 76, temperature 98 F (36.7 C), temperature source Oral, resp. rate 14, height 6' 1"  (1.854 m), weight (!) 141.1 kg (311 lb), SpO2 100 %.  HEENT-  Normocephalic, no lesions, without obvious abnormality.  Normal external eye and conjunctiva.  Normal TM's bilaterally.  Normal auditory canals and external ears. Normal external nose, mucus membranes and septum.  Normal pharynx.  No occipital ridge tenderness.  No temporal tenderness. Cardiovascular- S1, S2 normal, pulses palpable throughout   Lungs- chest clear, no wheezing, rales, normal symmetric air entry Abdomen- soft, non-tender; bowel sounds normal; no masses,  no organomegaly Extremities- BLE edema Lymph-no adenopathy palpable Musculoskeletal-no joint tenderness, deformity or swelling Skin-warm and dry, no hyperpigmentation, vitiligo, or suspicious lesions  Neurological Examination Mental Status: Alert, oriented, thought content appropriate.  Speech fluent without evidence of aphasia.  Able to follow 3 step commands without difficulty. Cranial Nerves: II: Discs flat bilaterally;  Visual fields grossly normal, pupils equal, round, reactive to light and accommodation III,IV, VI: ptosis not present, extra-ocular motions intact bilaterally V,VII: smile symmetric, facial light touch sensation normal bilaterally VIII: hearing normal bilaterally IX,X: gag reflex present XI: bilateral shoulder shrug XII: midline tongue extension Motor: Right : Upper extremity   5/5    Left:     Upper extremity   5/5  Lower extremity   5/5     Lower extremity   5/5 Tone and bulk:normal tone throughout; no atrophy noted Sensory: Pinprick and light touch intact  Deep Tendon Reflexes: 2+ and symmetric with absent AJ's bilaterally Plantars: Right: mute   Left: mute Cerebellar: Normal finger-to-nose and normal heel-to-shin testing bilaterally Gait: not tested due to safety concerns   Laboratory Studies:   Basic Metabolic Panel:  Recent Labs Lab 09/07/16 1327 09/08/16 0348 09/08/16 1054  NA 141 134*  --   K 5.8* 7.0*  --   CL 100* 97*  --   CO2 28 24  --   GLUCOSE 240* 536*  --   BUN 77* 56*  --   CREATININE 11.96* 8.70*  --   CALCIUM 7.4* 7.8*  --   PHOS  --   --  5.6*    Liver Function Tests: No results for input(s): AST, ALT, ALKPHOS, BILITOT, PROT, ALBUMIN in the last 168 hours. No results for input(s): LIPASE, AMYLASE in the last 168 hours. No results for input(s): AMMONIA in the last 168 hours.  CBC:  Recent Labs Lab 09/07/16 1327 09/08/16 0348  WBC 12.4* 12.6*  NEUTROABS 10.6*  --   HGB 10.5* 11.0*  HCT 33.2* 34.9*  MCV 93.1 93.7  PLT 370 357    Cardiac Enzymes:  Recent Labs Lab 09/07/16 1327 09/07/16 2213 09/08/16 0348 09/08/16 1054  TROPONINI 0.04* 0.07* 0.05* 0.05*    BNP: Invalid input(s): POCBNP  CBG:  Recent Labs Lab 09/08/16 0315 09/08/16 0503 09/08/16 0731 09/08/16 0801 09/08/16 1139  GLUCAP 459* 507* 445* 411* 172*    Microbiology: Results for orders placed or performed during the hospital encounter of 08/28/16  MRSA PCR  Screening     Status: None   Collection Time: 08/29/16  3:45 AM  Result Value Ref Range Status   MRSA by PCR NEGATIVE NEGATIVE Final    Comment:        The  GeneXpert MRSA Assay (FDA approved for NASAL specimens only), is one component of a comprehensive MRSA colonization surveillance program. It is not intended to diagnose MRSA infection nor to guide or monitor treatment for MRSA infections.     Coagulation Studies: No results for input(s): LABPROT, INR in the last 72 hours.  Urinalysis: No results for input(s): COLORURINE, LABSPEC, PHURINE, GLUCOSEU, HGBUR, BILIRUBINUR, KETONESUR, PROTEINUR, UROBILINOGEN, NITRITE, LEUKOCYTESUR in the last 168 hours.  Invalid input(s): APPERANCEUR  Lipid Panel:     Component Value Date/Time   CHOL 112 07/31/2016 0924   CHOL 124 01/08/2015 0456   TRIG 164.0 (H) 07/31/2016 0924   TRIG 167 (H) 01/08/2015 0456   HDL 50.40 07/31/2016 0924   HDL 49 01/08/2015 0456   CHOLHDL 2 07/31/2016 0924   VLDL 32.8 07/31/2016 0924   VLDL 33 01/08/2015 0456   LDLCALC 29 07/31/2016 0924   LDLCALC 42 01/08/2015 0456    HgbA1C:  Lab Results  Component Value Date   HGBA1C 6.4 07/31/2016    Urine Drug Screen:  No results found for: LABOPIA, COCAINSCRNUR, LABBENZ, AMPHETMU, THCU, LABBARB  Alcohol Level: No results for input(s): ETH in the last 168 hours.  Other results: EKG: sinus rhythm at 76 bpm.  Imaging: Dg Chest 2 View  Result Date: 09/08/2016 CLINICAL DATA:  Shortness of breath EXAM: CHEST  2 VIEW COMPARISON:  Portable chest x-ray of September 07, 2016 FINDINGS: The left lung is adequately inflated and clear. On the right there is mild volume loss. A small amount of pleural fluid is present. The interstitial markings in the right mid and lower lung remain increased but have improved. The cardiac silhouette remains enlarged. The central pulmonary vascularity is less engorged. The bony thorax exhibits no acute abnormality. IMPRESSION: Slight  interval improvement in right lower lobe pneumonia. Persistent small right pleural effusion. Low-grade CHF. Electronically Signed   By: David  Martinique M.D.   On: 09/08/2016 09:11   Dg Chest Portable 1 View  Result Date: 09/07/2016 CLINICAL DATA:  Respiratory distress EXAM: PORTABLE CHEST 1 VIEW COMPARISON:  08/28/2016 FINDINGS: Cardiac shadow is mildly enlarged but stable. Patchy changes are again seen in the right lung base stable from the prior exam. No new focal infiltrate or sizable effusion is noted. Mild central vascular congestion remains. No osseous abnormality is seen. IMPRESSION: Stable changes in the right lung base similar to that seen on prior exam. Electronically Signed   By: Inez Catalina M.D.   On: 09/07/2016 14:07     Assessment/Plan: 55 year old male with 2 year history of shooting headaches that have increased in frequency, yet still only momentary and 2-4 times per week.  Patient on Neurontin.  Reports being on 622m TID although medication list reports 3041mTID.  With ESRD would not like to increase any further.  No evidence on exam of occipital neuralgia but can not rule out cluster variant versus TA.  Recommendations: 1.  ESR, CRP 2.  Instead of increasing Neurontin may want to consider start of Verapamil ER 12080mhs for headache prophylaxis.  It appears that his blood pressure can tolerate this.   3.  Patient to follow up with neurology on an outpatient basis.  Follow up in 4-6 weeks.    LesAlexis GoodellD Neurology [phone]/27/2017, 1:02 PM

## 2016-09-08 NOTE — Progress Notes (Signed)
[employer] - [place] at [facility]   PATIENT NAME: Ryan OxfordMyron Sassaman    MR#:  [id number]  DATE OF BIRTH:  03/29/1961  SUBJECTIVE:  Describes milli-seconds of sharp shooting pain that has worsened over time. Patient also says anytime he gets off the steroids he has increasing shortness of breath. He says he is compliant with dialysis sessions. He denies chest pain. He has a diagnosis of BOOp AND is followed by Dr. Dema SeverinMungal  REVIEW OF SYSTEMS:    Review of Systems  Constitutional: Negative.  Negative for chills, fever and malaise/fatigue.  HENT: Negative.  Negative for ear discharge, ear pain, hearing loss, nosebleeds and sore throat.   Eyes: Negative.  Negative for blurred vision and pain.  Respiratory: Positive for shortness of breath. Negative for cough, hemoptysis and wheezing.   Cardiovascular: Negative.  Negative for chest pain, palpitations and leg swelling.  Gastrointestinal: Negative.  Negative for abdominal pain, blood in stool, diarrhea, nausea and vomiting.  Genitourinary: Negative.  Negative for dysuria.  Musculoskeletal: Negative.  Negative for back pain.  Skin: Negative.   Neurological: Negative for dizziness, tremors, speech change, focal weakness, seizures and headaches.  Endo/Heme/Allergies: Negative.  Does not bruise/bleed easily.  Psychiatric/Behavioral: Negative.  Negative for depression, hallucinations and suicidal ideas.    Tolerating Diet:yes      DRUG ALLERGIES:   Allergies  Allergen Reactions  . Oxycodone-Acetaminophen Nausea And Vomiting  . Tape Other (See Comments)    Paper tape caused blisters    VITALS:  Blood pressure (!) 147/69, pulse 76, temperature 98 F (36.7 C), temperature source Oral, resp. rate 14, height 6\' 1"  (1.854 m), weight (!) 141.1 kg (311 lb), SpO2 100 %.  PHYSICAL EXAMINATION:   Physical Exam  Constitutional: He is oriented to person, place, and time. No distress.  obese  HENT:  Head: Normocephalic.  Eyes:  No scleral icterus.  Neck: Normal range of motion. Neck supple. No JVD present. No tracheal deviation present.  Cardiovascular: Normal rate, regular rhythm and normal heart sounds.  Exam reveals no gallop and no friction rub.   No murmur heard. Pulmonary/Chest: Effort normal and breath sounds normal. No respiratory distress. He has no wheezes. He has no rales. He exhibits no tenderness.  RLL crackles base  Abdominal: Soft. Bowel sounds are normal. He exhibits no distension and no mass. There is no tenderness. There is no rebound and no guarding.  Musculoskeletal: Normal range of motion. He exhibits no edema.  Neurological: He is alert and oriented to person, place, and time.  Skin: Skin is warm. No rash noted. No erythema.  Psychiatric: Affect and judgment normal.      LABORATORY PANEL:   CBC  Recent Labs Lab 09/08/16 0348  WBC 12.6*  HGB 11.0*  HCT 34.9*  PLT 357   ------------------------------------------------------------------------------------------------------------------  Chemistries   Recent Labs Lab 09/08/16 0348  NA 134*  K 7.0*  CL 97*  CO2 24  GLUCOSE 536*  BUN 56*  CREATININE 8.70*  CALCIUM 7.8*   ------------------------------------------------------------------------------------------------------------------  Cardiac Enzymes  Recent Labs Lab 09/07/16 2213 09/08/16 0348 09/08/16 1054  TROPONINI 0.07* 0.05* 0.05*   ------------------------------------------------------------------------------------------------------------------  RADIOLOGY:  Dg Chest 2 View  Result Date: 09/08/2016 CLINICAL DATA:  Shortness of breath EXAM: CHEST  2 VIEW COMPARISON:  Portable chest x-ray of September 07, 2016 FINDINGS: The left lung is adequately inflated and clear. On the right there is mild volume loss. A small amount of pleural fluid is present. The interstitial markings in the  right mid and lower lung remain increased but have improved. The cardiac silhouette  remains enlarged. The central pulmonary vascularity is less engorged. The bony thorax exhibits no acute abnormality. IMPRESSION: Slight interval improvement in right lower lobe pneumonia. Persistent small right pleural effusion. Low-grade CHF. Electronically Signed   By: David  SwazilandJordan M.D.   On: 09/08/2016 09:11   Dg Chest Portable 1 View  Result Date: 09/07/2016 CLINICAL DATA:  Respiratory distress EXAM: PORTABLE CHEST 1 VIEW COMPARISON:  08/28/2016 FINDINGS: Cardiac shadow is mildly enlarged but stable. Patchy changes are again seen in the right lung base stable from the prior exam. No new focal infiltrate or sizable effusion is noted. Mild central vascular congestion remains. No osseous abnormality is seen. IMPRESSION: Stable changes in the right lung base similar to that seen on prior exam. Electronically Signed   By: Alcide CleverMark  Lukens M.D.   On: 09/07/2016 14:07     ASSESSMENT AND PLAN:   55 year old male with a history of BOOP, ESRD on hemodialysis, diabetes with retinopathy and neuropathy and OSA on CPAP who presents with shortness of breath.  1. Acute on chronic hypoxic respiratory failure which is multifactorial due to volume overload, hypertensive urgency and underlying BOOP and right lower lobe pneumonia Patient is also at risk of PE.  Will continue dialysis Obtain pulmonary consultation.?? Benefit from imaging such as CT to evaluate for PE Continue steroids and nebulizer treatments with inhaler Start Levaquin (pharmacy to renally dose)  2. Severe Hyperkalemia without EKG changes: Patient will need dialysis again this morning. He did receive urgent dialysis yesterday and Receive treatment with Kayexalate  3. ESRD on hemodialysis with fluid overload: Patient will require ongoing dialysis. He may need more fluid taken off of him during dialysis sessions Continue  SELevemir  4. Hypertensive urgency: Patient reports uncontrolled blood pressure for some time and not only related to  steroids. Increase Avapro to 300 mg daily. Continue metoprolol MONITOR BLOOD PRESSURE  5. Obstructive sleep apnea: Continue BiPAP at night  6. Type 1 diabetes: Continue sliding scale insulin, Lantus and NovoLog Appreciate diabetes consult   7. Diabetic neuropathy: Continue gabapentin  8. Sharp shooting headpains at sounds neuropathic in nature: Discussed case with Dr. Thad Rangereynolds   Management plans discussed with the patient and he is in agreement.  CODE STATUS: full  TOTAL TIME TAKING CARE OF THIS PATIENT: 38 minutes.    D/w dr Cherylann Ratellateef and nurse  POSSIBLE D/C 2-3 days, DEPENDING ON CLINICAL CONDITION.   Sharea Guinther M.D on 09/08/2016 at 12:33 PM  Between 7am to 6pm - Pager - [phone] After 6pm go to [email or web address] - password [email or web address]  [facility]  Office  [phone]  CC: Primary care physician; Tommie SamsJayce G Cook, DO  Note: This dictation was prepared with Dragon dictation along with smaller phrase technology. Any transcriptional errors that result from this process are unintentional.

## 2016-09-08 NOTE — Progress Notes (Addendum)
Inpatient Diabetes Program Recommendations  AACE/ADA: New Consensus Statement on Inpatient Glycemic Control (2015)  Target Ranges:  Prepandial:   less than 140 mg/dL      Peak postprandial:   less than 180 mg/dL (1-2 hours)      Critically ill patients:  140 - 180 mg/dL   Results for Ryan Arnold, Ryan Arnold (MRN [id number]) as of 09/08/2016 11:31  Ref. Range 09/07/2016 21:55 09/08/2016 03:15 09/08/2016 05:03 09/08/2016 07:31 09/08/2016 08:01  Glucose-Capillary Latest Ref Range: 65 - 99 mg/dL 562131 (H) 130459 (H) 865507 (HH) 445 (H) 411 (H)    Admit with: SOB/ Fluid Overload/ Hyperkalemia  History: DM, ESRD, COPD  Home DM Meds: Evaristo Buryresiba Insulin- 40 units daily       Novolog 40 units TIDWC  Current Insulin Orders: Novolog Resistant Correction Scale/ SSI (0-20 units) TID AC + HS      Novolog 40 units TIDWC      Novolog 20 units prn for CBG >500 mg/dl       -Patient sees Dr. Reather LittlerAjay Kumar with [facility] for DM management.  Last saw Dr. Lucianne MussKumar on 07/31/16.  At that visit, patient was instructed to reduce his basal insulin Evaristo Bury(Tresiba) to 38 units daily.  -Pt received 125 mg Solumedrol X 1 dose yesterday at 5pm and then started on Solumedrol 40 mg Q6 hours.  -Had dialysis last PM and scheduled for dialysis again today.     MD- Please consider the following in-hospital insulin adjustments:  1. Start Levemir 30 units daily (75% home dose of basal insulin- hospital does not carry [place]iba so we can substitute Levemir)  2. Discontinue order for Novolog 20 units prn CBG >500 mg/dl       --Will follow patient during hospitalization--  Ambrose FinlandJeannine Johnston Bettie Swavely RN, MSN, CDE Diabetes Coordinator Inpatient Glycemic Control Team Team Pager: [phone] (8a-5p)

## 2016-09-08 NOTE — Progress Notes (Signed)
Notified MD Pyreddy of CBG 459. 14 units of insulin to be given per MD order. Nursing staff will continue to monitor for any changes in patient status. Lamonte RicherKara A Heide Brossart, RN

## 2016-09-08 NOTE — Progress Notes (Signed)
Start of hd 

## 2016-09-08 NOTE — Progress Notes (Signed)
  End of hd 

## 2016-09-08 NOTE — Progress Notes (Signed)
Post hd vitals 

## 2016-09-08 NOTE — Progress Notes (Signed)
Post hd assessment 

## 2016-09-08 NOTE — Progress Notes (Signed)
Pre hd assessment  

## 2016-09-09 ENCOUNTER — Inpatient Hospital Stay: Payer: Medicare Other

## 2016-09-09 ENCOUNTER — Inpatient Hospital Stay (HOSPITAL_COMMUNITY)
Admit: 2016-09-09 | Discharge: 2016-09-09 | Disposition: A | Discharge status: Home / Self Care | Payer: Medicare Other | Attending: Nephrology | Admitting: Nephrology

## 2016-09-09 DIAGNOSIS — R0600 Dyspnea, unspecified: Secondary | ICD-10-CM

## 2016-09-09 LAB — BASIC METABOLIC PANEL
ANION GAP: 12 (ref 5–15)
BUN: 42 mg/dL — ABNORMAL HIGH (ref 6–20)
CO2: 29 mmol/L (ref 22–32)
Calcium: 7.5 mg/dL — ABNORMAL LOW (ref 8.9–10.3)
Chloride: 95 mmol/L — ABNORMAL LOW (ref 101–111)
Creatinine, Ser: 6.27 mg/dL — ABNORMAL HIGH (ref 0.61–1.24)
GFR calc Af Amer: 11 mL/min — ABNORMAL LOW (ref >60)
GFR, EST NON AFRICAN AMERICAN: 9 mL/min — AB (ref >60)
Glucose, Bld: 324 mg/dL — ABNORMAL HIGH (ref 65–99)
POTASSIUM: 4.8 mmol/L (ref 3.5–5.1)
SODIUM: 136 mmol/L (ref 135–145)

## 2016-09-09 LAB — BODY FLUID CELL COUNT WITH DIFFERENTIAL
EOS FL: 0 %
LYMPHS FL: 68 %
MONOCYTE-MACROPHAGE-SEROUS FLUID: 16 %
Neutrophil Count, Fluid: 16 %
Total Nucleated Cell Count, Fluid: 291 cu mm

## 2016-09-09 LAB — GLUCOSE, CAPILLARY
GLUCOSE-CAPILLARY: 445 mg/dL — AB (ref 65–99)
GLUCOSE-CAPILLARY: 214 mg/dL — AB (ref 65–99)
Glucose-Capillary: 341 mg/dL — ABNORMAL HIGH (ref 65–99)
Glucose-Capillary: 126 mg/dL — ABNORMAL HIGH (ref 65–99)

## 2016-09-09 LAB — PROTEIN, TOTAL: Total Protein: 6.7 g/dL (ref 6.5–8.1)

## 2016-09-09 LAB — GLUCOSE, SEROUS FLUID: GLUCOSE FL: 489 mg/dL

## 2016-09-09 LAB — LACTATE DEHYDROGENASE: LDH: 218 U/L — ABNORMAL HIGH (ref 98–192)

## 2016-09-09 LAB — ECHOCARDIOGRAM LIMITED
Height: 73 in
Weight: 4963.2 oz

## 2016-09-09 LAB — HEMOGLOBIN A1C
Hgb A1c MFr Bld: 7.5 % — ABNORMAL HIGH (ref 4.8–5.6)
Mean Plasma Glucose: 169 mg/dL

## 2016-09-09 LAB — LACTATE DEHYDROGENASE, PLEURAL OR PERITONEAL FLUID: LD, Fluid: 73 U/L — ABNORMAL HIGH (ref 3–23)

## 2016-09-09 LAB — PROTEIN, BODY FLUID

## 2016-09-09 MED ORDER — METHYLPREDNISOLONE SODIUM SUCC 40 MG IJ SOLR: 40.0000 mg | Freq: Every day | INTRAMUSCULAR | Status: DC

## 2016-09-09 MED ORDER — MORPHINE SULFATE (PF) 4 MG/ML IV SOLN
2.0000 mg | INTRAVENOUS | Status: DC | PRN
  Administered 2016-09-09 – 2016-09-10 (×2): 2 mg via INTRAVENOUS
  Filled 2016-09-09 (×2): qty 1

## 2016-09-09 MED ORDER — INSULIN ASPART 100 UNIT/ML ~~LOC~~ SOLN
0.0000 [IU] | Freq: Every day | SUBCUTANEOUS | Status: DC
  Administered 2016-09-09: 2 [IU] via SUBCUTANEOUS
  Filled 2016-09-09: qty 2

## 2016-09-09 MED ORDER — INSULIN ASPART 100 UNIT/ML ~~LOC~~ SOLN
0.0000 [IU] | Freq: Three times a day (TID) | SUBCUTANEOUS | Status: DC
  Administered 2016-09-09: 7 [IU] via SUBCUTANEOUS
  Administered 2016-09-10: 3 [IU] via SUBCUTANEOUS
  Administered 2016-09-10 – 2016-09-11 (×2): 2 [IU] via SUBCUTANEOUS
  Administered 2016-09-11: 1 [IU] via SUBCUTANEOUS
  Filled 2016-09-09: qty 7
  Filled 2016-09-09: qty 2
  Filled 2016-09-09: qty 1
  Filled 2016-09-09: qty 3
  Filled 2016-09-09: qty 2

## 2016-09-09 MED ORDER — VERAPAMIL HCL ER 120 MG PO TBCR
120.0000 mg | EXTENDED_RELEASE_TABLET | Freq: Every day | ORAL | Status: DC
  Administered 2016-09-09 – 2016-09-10 (×2): 120 mg via ORAL
  Filled 2016-09-09 (×3): qty 1

## 2016-09-09 MED ORDER — INSULIN GLARGINE 100 UNIT/ML ~~LOC~~ SOLN
40.0000 [IU] | Freq: Every day | SUBCUTANEOUS | Status: DC
  Administered 2016-09-10 – 2016-09-11 (×2): 40 [IU] via SUBCUTANEOUS
  Filled 2016-09-09 (×2): qty 0.4

## 2016-09-09 MED ORDER — INSULIN ASPART 100 UNIT/ML ~~LOC~~ SOLN
30.0000 [IU] | Freq: Three times a day (TID) | SUBCUTANEOUS | Status: DC
  Administered 2016-09-09 – 2016-09-10 (×2): 30 [IU] via SUBCUTANEOUS
  Filled 2016-09-09: qty 30

## 2016-09-09 NOTE — Care Management (Signed)
Patient had 460 cc fluid removed  through right thoracentesis

## 2016-09-09 NOTE — Progress Notes (Signed)
Inpatient Diabetes Program Recommendations  AACE/ADA: New Consensus Statement on Inpatient Glycemic Control (2015)  Target Ranges:  Prepandial:   less than 140 mg/dL      Peak postprandial:   less than 180 mg/dL (1-2 hours)      Critically ill patients:  140 - 180 mg/dL   Results for Ryan Arnold, Ryan Arnold (MRN [id number]) as of 09/09/2016 09:57  Ref. Range 09/08/2016 08:01 09/08/2016 11:39 09/08/2016 17:01 09/08/2016 17:20 09/08/2016 21:57  Glucose-Capillary Latest Ref Range: 65 - 99 mg/dL 409411 (H) 811172 (H) 56 (L) 75 101 (H)   Results for Ryan Arnold, Ryan Arnold (MRN [id number]) as of 09/09/2016 09:57  Ref. Range 09/09/2016 07:39  Glucose-Capillary Latest Ref Range: 65 - 99 mg/dL 213445 (H)    Admit with: SOB/ Fluid Overload/ Hyperkalemia  History: DM, ESRD, COPD  Home DM Meds: Evaristo Buryresiba Insulin- 40 units daily                             Novolog 40 units TIDWC  Current Insulin Orders: Novolog Resistant Correction Scale/ SSI (0-20 units) TID AC + HS                                       Novolog 40 units TIDWC                                       Lantus 30 units daily       -Patient sees Dr. Reather LittlerAjay Kumar with [facility] for DM management.  Last saw Dr. Lucianne MussKumar on 07/31/16.  At that visit, patient was instructed to reduce his basal insulin Evaristo Bury(Tresiba) to 38 units daily.  -Pt currently receiving IV Solumedrol 40 mg BID.  -Had Hypoglycemic event yesterday at 5pm (CBG 56 mg/dl) after receiving a total of 44 units Novolog at 12pm (40 units Meal Coverage + 4 units SSI).  -Extreme Hyperglycemia this AM (CBG 445 mg/dl).      MD- Please consider the following in-hospital insulin adjustments:  1. Increase Lantus to 40 units daily (home dose)  2. Reduce Novolog Correction Scale/ SSI to Sensitive scale (0-9 units) TID AC + HS (currently ordered as Resistant scale 0-20 units)  3. May want to reduce Novolog Meal Coverage as well: Reduce to Novolog 30 units TID with meals (hold if pt  eats <50% of meal)  (this would be 75% of total home dose of Novolog)      --Will follow patient during hospitalization--  Ambrose FinlandJeannine Johnston Tatia Petrucci RN, MSN, CDE Diabetes Coordinator Inpatient Glycemic Control Team Team Pager: [phone] (8a-5p)

## 2016-09-09 NOTE — Progress Notes (Signed)
Spoke with dr. Juliene PinaMody regarding blood sugar of 126 per md hold scheduled dose of 30unit novolog and sliding scale 1 unit novolog. Will continue to monitor

## 2016-09-09 NOTE — Progress Notes (Signed)
[employer] - [place] at [facility]   PATIENT NAME: Ryan OxfordMyron Arnold    MR#:  [id number]  DATE OF BIRTH:  06/25/1961  SUBJECTIVE:  Having leg pain SOB still there a little better then yesterday    REVIEW OF SYSTEMS:    Review of Systems  Constitutional: Negative.  Negative for chills, fever and malaise/fatigue.  HENT: Negative.  Negative for ear discharge, ear pain, hearing loss, nosebleeds and sore throat.   Eyes: Negative.  Negative for blurred vision and pain.  Respiratory: Positive for shortness of breath (better). Negative for cough, hemoptysis and wheezing.   Cardiovascular: Negative.  Negative for chest pain, palpitations and leg swelling.  Gastrointestinal: Negative.  Negative for abdominal pain, blood in stool, diarrhea, nausea and vomiting.  Genitourinary: Negative.  Negative for dysuria.  Musculoskeletal: Negative.  Negative for back pain.  Skin: Negative.   Neurological: Negative for dizziness, tremors, speech change, focal weakness, seizures and headaches.  Endo/Heme/Allergies: Negative.  Does not bruise/bleed easily.  Psychiatric/Behavioral: Negative.  Negative for depression, hallucinations and suicidal ideas.    Tolerating Diet:yes      DRUG ALLERGIES:   Allergies  Allergen Reactions  . Oxycodone-Acetaminophen Nausea And Vomiting  . Tape Other (See Comments)    Paper tape caused blisters    VITALS:  Blood pressure (!) 166/79, pulse 74, temperature 97.9 F (36.6 C), temperature source Oral, resp. rate 18, height 6\' 1"  (1.854 m), weight (!) 140.7 kg (310 lb 3.2 oz), SpO2 100 %.  PHYSICAL EXAMINATION:   Physical Exam  Constitutional: He is oriented to person, place, and time. No distress.  obese  HENT:  Head: Normocephalic.  Eyes: No scleral icterus.  Neck: Normal range of motion. Neck supple. No JVD present. No tracheal deviation present.  Cardiovascular: Normal rate, regular rhythm and normal heart sounds.  Exam reveals no gallop  and no friction rub.   No murmur heard. Pulmonary/Chest: Effort normal and breath sounds normal. No respiratory distress. He has no wheezes. He has no rales. He exhibits no tenderness.  RLL crackles base/decreased at the base  Abdominal: Soft. Bowel sounds are normal. He exhibits no distension and no mass. There is no tenderness. There is no rebound and no guarding.  Musculoskeletal: Normal range of motion. He exhibits no edema.  Neurological: He is alert and oriented to person, place, and time.  Skin: Skin is warm. No rash noted. No erythema.  Psychiatric: Affect and judgment normal.      LABORATORY PANEL:   CBC  Recent Labs Lab 09/08/16 0348  WBC 12.6*  HGB 11.0*  HCT 34.9*  PLT 357   ------------------------------------------------------------------------------------------------------------------  Chemistries   Recent Labs Lab 09/09/16 0436  NA 136  K 4.8  CL 95*  CO2 29  GLUCOSE 324*  BUN 42*  CREATININE 6.27*  CALCIUM 7.5*   ------------------------------------------------------------------------------------------------------------------  Cardiac Enzymes  Recent Labs Lab 09/07/16 2213 09/08/16 0348 09/08/16 1054  TROPONINI 0.07* 0.05* 0.05*   ------------------------------------------------------------------------------------------------------------------  RADIOLOGY:  Dg Chest 1 View  Result Date: 09/09/2016 CLINICAL DATA:  Thoracentesis. EXAM: CHEST 1 VIEW COMPARISON:  Ultrasound 09/09/2016.  Chest x-ray 09/08/2016. FINDINGS: Mediastinum and hilar structures stable. Stable cardiomegaly. Interim right thoracentesis. No pneumothorax. Mild atelectatic changes noted throughout the right lung. Pleural thickening noted on the right. IMPRESSION: 1. Right thoracentesis. No pneumothorax . Mild atelectatic changes noted throughout the right lung. Mild right pleural thickening noted. 2.  Stable cardiomegaly. Electronically Signed   By: Maisie Fushomas  Register   On:  09/09/2016 10:23  Dg Chest 2 View  Result Date: 09/08/2016 CLINICAL DATA:  Shortness of breath EXAM: CHEST  2 VIEW COMPARISON:  Portable chest x-ray of September 07, 2016 FINDINGS: The left lung is adequately inflated and clear. On the right there is mild volume loss. A small amount of pleural fluid is present. The interstitial markings in the right mid and lower lung remain increased but have improved. The cardiac silhouette remains enlarged. The central pulmonary vascularity is less engorged. The bony thorax exhibits no acute abnormality. IMPRESSION: Slight interval improvement in right lower lobe pneumonia. Persistent small right pleural effusion. Low-grade CHF. Electronically Signed   By: David  SwazilandJordan M.D.   On: 09/08/2016 09:11   Ct Angio Chest Pe W Or Wo Contrast  Result Date: 09/08/2016 CLINICAL DATA:  Shortness of Breath EXAM: CT ANGIOGRAPHY CHEST WITH CONTRAST TECHNIQUE: Multidetector CT imaging of the chest was performed using the standard protocol during bolus administration of intravenous contrast. Multiplanar CT image reconstructions and MIPs were obtained to evaluate the vascular anatomy. CONTRAST:  75 cc Isovue COMPARISON:  CT chest 01/08/2015 FINDINGS: Cardiovascular: No pulmonary embolus is noted. Heart size within normal limits. Trace anterior pericardial effusion. There is no aortic aneurysm. Mediastinum/Nodes: No mediastinal hematoma or adenopathy. Small air is noted within esophagus. Lungs/Pleura: There is partially loculated small right pleural effusion. Some atelectasis or scarring is noted in right lower lobe posteriorly. No segmental infiltrate or pulmonary edema. No fibrotic changes are noted. No bronchiectasis. Upper Abdomen: The visualized upper abdomen shows status postcholecystectomy. No adrenal gland mass is noted. Visualized pancreas and spleen is unremarkable. Musculoskeletal: No destructive bony lesions are noted. Sagittal images of the spine shows degenerative changes  thoracic spine. Review of the MIP images confirms the above findings. IMPRESSION: 1. No pulmonary embolus is noted. 2. There is small partially loculated right pleural effusion. Atelectasis or scarring is noted in right lower lobe posteriorly. 3. No mediastinal hematoma or adenopathy. 4. Status postcholecystectomy. 5. Degenerative changes thoracic spine. Electronically Signed   By: Natasha MeadLiviu  Pop M.D.   On: 09/08/2016 16:42   Dg Chest Right Decubitus  Result Date: 09/08/2016 CLINICAL DATA:  Hypoxia with pleural effusion EXAM: CHEST - RIGHT DECUBITUS COMPARISON:  CT 09/08/2016, chest x-ray 09/08/2016 FINDINGS: Right lateral decubitus image demonstrates grossly clear left lung fields. A layering right pleural effusion is visualize with some loculation suggested at the right base. Limited evaluation of the pulmonary parenchyma due to positioning. IMPRESSION: Small layering right pleural effusion with mild loculation suggested at the mid to lower lung zone. Electronically Signed   By: Jasmine PangKim  Fujinaga M.D.   On: 09/08/2016 23:36   Dg Chest Portable 1 View  Result Date: 09/07/2016 CLINICAL DATA:  Respiratory distress EXAM: PORTABLE CHEST 1 VIEW COMPARISON:  08/28/2016 FINDINGS: Cardiac shadow is mildly enlarged but stable. Patchy changes are again seen in the right lung base stable from the prior exam. No new focal infiltrate or sizable effusion is noted. Mild central vascular congestion remains. No osseous abnormality is seen. IMPRESSION: Stable changes in the right lung base similar to that seen on prior exam. Electronically Signed   By: Alcide CleverMark  Lukens M.D.   On: 09/07/2016 14:07   [place] Thoracentesis Asp Pleural Space W/img Guide  Result Date: 09/09/2016 INDICATION: 55 year old with history of BOOP and end-stage renal disease. Patient presents with hypoxia. CT demonstrates a small right pleural effusion. EXAM: ULTRASOUND GUIDED RIGHT THORACENTESIS MEDICATIONS: None. COMPLICATIONS: None immediate. PROCEDURE: An  ultrasound guided thoracentesis was thoroughly discussed with the patient and  questions answered. The benefits, risks, alternatives and complications were also discussed. The patient understands and wishes to proceed with the procedure. Written consent was obtained. Ultrasound was performed to localize and mark an adequate pocket of fluid in the right chest. The area was then prepped and draped in the normal sterile fashion. 1% Lidocaine was used for local anesthesia. Under ultrasound guidance a 19 gauge, 10-cm, Yueh catheter was introduced. Thoracentesis was performed. The catheter was removed and a dressing applied. FINDINGS: A total of approximately 460 mL of bloody fluid was removed. Samples were sent to the laboratory as requested by the clinical team. IMPRESSION: Successful ultrasound guided right thoracentesis yielding 460 mL of pleural fluid. Electronically Signed   By: Richarda Overlie M.D.   On: 09/09/2016 10:47     ASSESSMENT AND PLAN:   55 year old male with a history of BOOP, ESRD on hemodialysis, diabetes with retinopathy and neuropathy and OSA on CPAP who presents with shortness of breath.  1. Acute on chronic hypoxic respiratory failure which is multifactorial due to volume overload, hypertensive urgency and underlying BOOP and right lower lobe pneumonia/Right pleural effusion. CT ordered to evaluate for PE was negative however does show right pleural effusion.  Patient underwent thoracentesis today.  Follow-up on final results  Continue steroids and nebulizer treatments with inhaler Continue Levaquin (pharmacy to renally dose) for CAP.  2. Severe Hyperkalemia without EKG changes: Resolved with dialysis. [name]   3. ESRD on hemodialysis with fluid overload: Continue dialysis as per recommendations by nephrology Continue  SELevemir  4. Hypertensive urgency: Patient reports uncontrolled blood pressure for some time and not only related to steroids. Increased Avapro to 300 mg daily. Continue  metoprolol MONITOR BLOOD PRESSURE  5. Obstructive sleep apnea: Continue BiPAP at night  6. Type 1 diabetes: Continue sliding scale insulin, Lantus and NovoLog Appreciate diabetes consult   7. Diabetic neuropathy: Continue gabapentin  8. Sharp shooting headpains at sounds neuropathic in nature: Discussed case with Dr. Thad Ranger Recommendations are to start VERAPAMIL 120 mg at bedtime for headache prophylaxis and continue current dose of gabapentin Follow-up with neurology in 4-6 weeks  Management plans discussed with the patient and he is in agreement.  CODE STATUS: full  TOTAL TIME TAKING CARE OF THIS PATIENT: 29 minutes.    D/w  nurse  POSSIBLE D/C 2-3 days, DEPENDING ON CLINICAL CONDITION.   Ryu Cerreta M.D on 09/09/2016 at 11:31 AM  Between 7am to 6pm - Pager - [phone] After 6pm go to [email or web address] - password [email or web address]  [facility]  Office  [phone]  CC: Primary care physician; Tommie Sams, DO  Note: This dictation was prepared with Dragon dictation along with smaller phrase technology. Any transcriptional errors that result from this process are unintentional.

## 2016-09-09 NOTE — Progress Notes (Signed)
*  PRELIMINARY RESULTS* Echocardiogram 2D Echocardiogram has been performed.  Cristela BlueHege, Deanne Bedgood 09/09/2016, 9:27 AM

## 2016-09-09 NOTE — Procedures (Signed)
US guided right thoracentesis.  Removed 460 ml of bloody pleural fluid.  No immediate complication.  CXR ordered.

## 2016-09-09 NOTE — Progress Notes (Signed)
Spoke with dr. Juliene PinaMody regarding blood sugar of 445. Patient currently scheduled to get 60 units of novolog. Per md only give 40units. Will continue to monitor

## 2016-09-09 NOTE — Progress Notes (Addendum)
Date: 09/09/2016,   MRN# [id number] Karsten FellsMyron W Noboa 07/06/1961 Code Status:     Code Status Orders        Start     Ordered   09/07/16 2144  Full code  Continuous     09/07/16 2143    Code Status History    Date Active Date Inactive Code Status Order ID Comments User Context   08/29/2016  3:09 AM 08/30/2016  1:13 PM Full Code [id number]  Arnaldo NatalMichael S Diamond, MD Inpatient   03/18/2015  8:11 PM 03/21/2015  6:23 PM Full Code [id number]  Adrian SaranSital Mody, MD Inpatient     Hosp day:@LENGTHOFSTAYDAYS @ Referring MD: @ATDPROV @      CC: sob, pleural effusion, hx of BOOP  HPI: This is a  55 year old male with the above complaint. He has been dx'ed of BOOP 2-3 years ago. Biopsy proving. Here with sob noted to have bilateral pleural effusions, EF > 55 %. He is on dialysis. S/p thoracentesis today, less sob, work up in progress. Reviewed xray and ct scans.  PMHX:   Past Medical History:  Diagnosis Date  . Anemia of chronic disease   . BOOP (bronchiolitis obliterans with organizing pneumonia) (HCC)    a. 01/2018 s/p R thoracoscopy/Bx confirming BOOP;  b. 02/2018 high dose steroids started.  . Chest pain    a. 06/2014 Myoview ([facility]) no ischemia/infarct, nl EF.  [name]. Chronic diastolic CHF (congestive heart failure) (HCC)    a. 12/2014 Echo: EF 50-55%, mild conc LVH, mildly dil LA.  . End stage renal disease (HCC)    a. on dialysis; still makes urine.  . Essential hypertension   . Hyperlipemia   . Morbid obesity (HCC)   . OSA treated with BiPAP   . Recurrent pneumonia    a. 2/2 BOOP.  [name]. Renal insufficiency   . Secondary hyperparathyroidism (HCC)   . Thyroid disease   . Type 1 diabetes mellitus (HCC)    Surgical Hx:  Past Surgical History:  Procedure Laterality Date  . cataract Bilateral   . GALLBLADDER SURGERY    . KNEE SURGERY Right   . LUNG BIOPSY    . SHOULDER SURGERY Left    Family Hx:  Family History  Problem Relation Age of Onset  . Diabetes Mother     died @ 4762.  [name]. Hypertension Mother    . Hypertension Maternal Grandmother    Social Hx:   Social History  Substance Use Topics  . Smoking status: Never Smoker  . Smokeless tobacco: Never Used  . Alcohol use No   Medication:    Home Medication:  Current Outpatient Rx  . Order #: [id number] Class: Normal    Current Medication: @CURMEDTAB @   Allergies:  Oxycodone-acetaminophen and Tape  Review of Systems: Gen:  Denies  fever, sweats, chills HEENT: Denies blurred vision, double vision, ear pain, eye pain, hearing loss, nose bleeds, sore throat Cvc:  No dizziness, chest pain or heaviness Resp: dyspnea, no hemoptysis, no wheezing or pleursiy    Gi: Denies swallowing difficulty, stomach pain, nausea or vomiting, diarrhea, constipation, bowel incontinence Gu:  Denies bladder incontinence, burning urine Ext:   No Joint pain, stiffness or swelling Skin: No skin rash, easy bruising or bleeding or hives Endoc:  No polyuria, polydipsia , polyphagia or weight change Psych: No depression, insomnia or hallucinations  Other:  All other systems negative  Physical Examination:   VS: BP (!) 144/65 (BP Location: Right Arm)   Pulse 70   Temp 97.8  F (36.6 C)   Resp 18   Ht 6\' 1"  (1.854 m)   Wt (!) 310 lb 3.2 oz (140.7 kg)   SpO2 100%   BMI 40.93 kg/m   General Appearance: No distress , large male, in bed, good historian Neuro: without focal findings, mental status, speech normal, alert and oriented, cranial nerves 2-12 intact, reflexes normal and symmetric, sensation grossly normal  HEENT: PERRLA, EOM intact, no ptosis, no other lesions noticed, Mallampati: Pulmonary:.No wheezing, No rales  Decrease at base   Cardiovascular:  Normal S1,S2.  No m/r/g.     Abdomen:Benign, Soft, non-tender, No masses, hepatosplenomegaly, No lymphadenopathy Endoc: No evident thyromegaly, no signs of acromegaly or Cushing features Skin:   warm, no rashes, no ecchymosis  Extremities: normal, no cyanosis, clubbing, no edema, warm with normal  capillary refill. Other findings:   Labs results:   Recent Labs     09/07/16  1327  09/08/16  0348  09/09/16  0436  HGB  10.5*  11.0*   --   HCT  33.2*  34.9*   --   MCV  93.1  93.7   --   WBC  12.4*  12.6*   --   BUN  77*  56*  42*  CREATININE  11.96*  8.70*  6.27*  GLUCOSE  240*  536*  324*  CALCIUM  7.4*  7.8*  7.5*  ,    Rad results:   Dg Chest 1 View  Result Date: 09/09/2016 CLINICAL DATA:  Thoracentesis. EXAM: CHEST 1 VIEW COMPARISON:  Ultrasound 09/09/2016.  Chest x-ray 09/08/2016. FINDINGS: Mediastinum and hilar structures stable. Stable cardiomegaly. Interim right thoracentesis. No pneumothorax. Mild atelectatic changes noted throughout the right lung. Pleural thickening noted on the right. IMPRESSION: 1. Right thoracentesis. No pneumothorax . Mild atelectatic changes noted throughout the right lung. Mild right pleural thickening noted. 2.  Stable cardiomegaly. Electronically Signed   By: Maisie Fus  Register   On: 09/09/2016 10:23   Ct Angio Chest Pe W Or Wo Contrast  Result Date: 09/08/2016 CLINICAL DATA:  Shortness of Breath EXAM: CT ANGIOGRAPHY CHEST WITH CONTRAST TECHNIQUE: Multidetector CT imaging of the chest was performed using the standard protocol during bolus administration of intravenous contrast. Multiplanar CT image reconstructions and MIPs were obtained to evaluate the vascular anatomy. CONTRAST:  75 cc Isovue COMPARISON:  CT chest 01/08/2015 FINDINGS: Cardiovascular: No pulmonary embolus is noted. Heart size within normal limits. Trace anterior pericardial effusion. There is no aortic aneurysm. Mediastinum/Nodes: No mediastinal hematoma or adenopathy. Small air is noted within esophagus. Lungs/Pleura: There is partially loculated small right pleural effusion. Some atelectasis or scarring is noted in right lower lobe posteriorly. No segmental infiltrate or pulmonary edema. No fibrotic changes are noted. No bronchiectasis. Upper Abdomen: The visualized upper abdomen  shows status postcholecystectomy. No adrenal gland mass is noted. Visualized pancreas and spleen is unremarkable. Musculoskeletal: No destructive bony lesions are noted. Sagittal images of the spine shows degenerative changes thoracic spine. Review of the MIP images confirms the above findings. IMPRESSION: 1. No pulmonary embolus is noted. 2. There is small partially loculated right pleural effusion. Atelectasis or scarring is noted in right lower lobe posteriorly. 3. No mediastinal hematoma or adenopathy. 4. Status postcholecystectomy. 5. Degenerative changes thoracic spine. Electronically Signed   By: Natasha Mead M.D.   On: 09/08/2016 16:42   Dg Chest Right Decubitus  Result Date: 09/08/2016 CLINICAL DATA:  Hypoxia with pleural effusion EXAM: CHEST - RIGHT DECUBITUS COMPARISON:  CT 09/08/2016, chest x-ray  09/08/2016 FINDINGS: Right lateral decubitus image demonstrates grossly clear left lung fields. A layering right pleural effusion is visualize with some loculation suggested at the right base. Limited evaluation of the pulmonary parenchyma due to positioning. IMPRESSION: Small layering right pleural effusion with mild loculation suggested at the mid to lower lung zone. Electronically Signed   By: Jasmine PangKim  Fujinaga M.D.   On: 09/08/2016 23:36   [place] Thoracentesis Asp Pleural Space W/img Guide  Result Date: 09/09/2016 INDICATION: 55 year old with history of BOOP and end-stage renal disease. Patient presents with hypoxia. CT demonstrates a small right pleural effusion. EXAM: ULTRASOUND GUIDED RIGHT THORACENTESIS MEDICATIONS: None. COMPLICATIONS: None immediate. PROCEDURE: An ultrasound guided thoracentesis was thoroughly discussed with the patient and questions answered. The benefits, risks, alternatives and complications were also discussed. The patient understands and wishes to proceed with the procedure. Written consent was obtained. Ultrasound was performed to localize and mark an adequate pocket of fluid in  the right chest. The area was then prepped and draped in the normal sterile fashion. 1% Lidocaine was used for local anesthesia. Under ultrasound guidance a 19 gauge, 10-cm, Yueh catheter was introduced. Thoracentesis was performed. The catheter was removed and a dressing applied. FINDINGS: A total of approximately 460 mL of bloody fluid was removed. Samples were sent to the laboratory as requested by the clinical team. IMPRESSION: Successful ultrasound guided right thoracentesis yielding 460 mL of pleural fluid. Electronically Signed   By: Richarda OverlieAdam  Henn M.D.   On: 09/09/2016 10:47   Summary:  Study Conclusions  - Left ventricle: The cavity size was at the upper limits of   normal. Wall thickness was increased increased in a pattern of   mild to moderate LVH. Systolic function was normal. The estimated   ejection fraction was in the range of 55% to 60%. Wall motion was   normal; there were no regional wall motion abnormalities. - Aortic valve: There was mild regurgitation. - Left atrium: The atrium was mildly dilated. - Pulmonary arteries: Pulmonary artery pressure was at least mildly   increased (35-40 mmHg plus central venous pressure). - Pericardium, extracardiac: A small pericardial effusion was   identified posterior to the heart.   Assessment and Plan: THE SHORTNESS OF BREATH IS MULTIFACTORIAL: HIS WEIGHT, BILATERAL PLEURAL EFFUSIONS ( ? FLUID OVERLOAD IN RENAL FAILURE), ? EARLY PULMONARY HYPERTENSION, ?? FAILURE IN THE SETTING OF HYPERTENSIVE URGENCY. AT PRESENT DO NOT THINK THE BOOP IS A CONTRIBUTOR.   TODAY  - CPAP  At night - keep prednisone at 20 mg q day - weight loss - Dialysis, take off extra fluid - thoracentesis ( done) track down chemistries - Dr. Tim LairKassa to assume care - further orders per above    I have personally obtained a history, examined the patient, evaluated laboratory and imaging results, formulated the assessment and plan and placed orders.  The Patient  requires high complexity decision making for assessment and support, frequent evaluation and titration of therapies, application of advanced monitoring technologies and extensive interpretation of multiple databases.   Shantanique Hodo,M.D. Pulmonary & Critical care Medicine [facility]

## 2016-09-09 NOTE — Progress Notes (Signed)
Subjective:  Patient seen at bedside.  Appreciate pulmonary input.  Had thoracentesis today.  Due for HD again tomorrow.   Objective:  Vital signs in last 24 hours:  Temp:  [97.7 F (36.5 C)-98.4 F (36.9 C)] 97.8 F (36.6 C) (11/28 1100) Pulse Rate:  [70-80] 70 (11/28 1100) Resp:  [13-18] 18 (11/28 1100) BP: (117-180)/(65-91) 144/65 (11/28 1100) SpO2:  [99 %-100 %] 100 % (11/28 1100) FiO2 (%):  [32 %] 32 % (11/28 1500) Weight:  [140.7 kg (310 lb 3.2 oz)-144.7 kg (319 lb 0.1 oz)] 140.7 kg (310 lb 3.2 oz) (11/28 0556)  Weight change: 0.003 kg (0.1 oz) Filed Weights   09/08/16 1739 09/08/16 2115 09/09/16 0556  Weight: (!) 144.7 kg (319 lb 0.1 oz) (!) 142.1 kg (313 lb 4.4 oz) (!) 140.7 kg (310 lb 3.2 oz)    Intake/Output:    Intake/Output Summary (Last 24 hours) at 09/09/16 1643 Last data filed at 09/09/16 1401  Gross per 24 hour  Intake             1080 ml  Output             2650 ml  Net            -1570 ml     Physical Exam: General: No acute distress, sitting up in bed   HEENT Anicteric, moist oral mucous membranes   Neck Supple   Pulm/lungs Right greater than left basilar rales, nasal cannula oxygen   CVS/Heart Regular, no rub or gallop   Abdomen:  Soft, nontender, nondistended, obese   Extremities: 2+ pitting edema   Neurologic: Alert, oriented, follows commands  Skin: No acute rashes   Access: Upper arm AV fistula - Left       Basic Metabolic Panel:   Recent Labs Lab 09/07/16 1327 09/08/16 0348 09/08/16 1054 09/09/16 0436  NA 141 134*  --  136  K 5.8* 7.0*  --  4.8  CL 100* 97*  --  95*  CO2 28 24  --  29  GLUCOSE 240* 536*  --  324*  BUN 77* 56*  --  42*  CREATININE 11.96* 8.70*  --  6.27*  CALCIUM 7.4* 7.8*  --  7.5*  PHOS  --   --  5.6*  --      CBC:  Recent Labs Lab 09/07/16 1327 09/08/16 0348  WBC 12.4* 12.6*  NEUTROABS 10.6*  --   HGB 10.5* 11.0*  HCT 33.2* 34.9*  MCV 93.1 93.7  PLT 370 357       Microbiology:  Recent Results (from the past 720 hour(s))  MRSA PCR Screening     Status: None   Collection Time: 08/29/16  3:45 AM  Result Value Ref Range Status   MRSA by PCR NEGATIVE NEGATIVE Final    Comment:        The GeneXpert MRSA Assay (FDA approved for NASAL specimens only), is one component of a comprehensive MRSA colonization surveillance program. It is not intended to diagnose MRSA infection nor to guide or monitor treatment for MRSA infections.     Coagulation Studies: No results for input(s): LABPROT, INR in the last 72 hours.  Urinalysis: No results for input(s): COLORURINE, LABSPEC, PHURINE, GLUCOSEU, HGBUR, BILIRUBINUR, KETONESUR, PROTEINUR, UROBILINOGEN, NITRITE, LEUKOCYTESUR in the last 72 hours.  Invalid input(s): APPERANCEUR    Imaging: Dg Chest 1 View  Result Date: 09/09/2016 CLINICAL DATA:  Thoracentesis. EXAM: CHEST 1 VIEW COMPARISON:  Ultrasound 09/09/2016.  Chest x-ray 09/08/2016.  FINDINGS: Mediastinum and hilar structures stable. Stable cardiomegaly. Interim right thoracentesis. No pneumothorax. Mild atelectatic changes noted throughout the right lung. Pleural thickening noted on the right. IMPRESSION: 1. Right thoracentesis. No pneumothorax . Mild atelectatic changes noted throughout the right lung. Mild right pleural thickening noted. 2.  Stable cardiomegaly. Electronically Signed   By: Maisie Fus  Register   On: 09/09/2016 10:23   Dg Chest 2 View  Result Date: 09/08/2016 CLINICAL DATA:  Shortness of breath EXAM: CHEST  2 VIEW COMPARISON:  Portable chest x-ray of September 07, 2016 FINDINGS: The left lung is adequately inflated and clear. On the right there is mild volume loss. A small amount of pleural fluid is present. The interstitial markings in the right mid and lower lung remain increased but have improved. The cardiac silhouette remains enlarged. The central pulmonary vascularity is less engorged. The bony thorax exhibits no acute  abnormality. IMPRESSION: Slight interval improvement in right lower lobe pneumonia. Persistent small right pleural effusion. Low-grade CHF. Electronically Signed   By: David  [place] M.D.   On: 09/08/2016 09:11   Ct Angio Chest Pe W Or Wo Contrast  Result Date: 09/08/2016 CLINICAL DATA:  Shortness of Breath EXAM: CT ANGIOGRAPHY CHEST WITH CONTRAST TECHNIQUE: Multidetector CT imaging of the chest was performed using the standard protocol during bolus administration of intravenous contrast. Multiplanar CT image reconstructions and MIPs were obtained to evaluate the vascular anatomy. CONTRAST:  75 cc Isovue COMPARISON:  CT chest 01/08/2015 FINDINGS: Cardiovascular: No pulmonary embolus is noted. Heart size within normal limits. Trace anterior pericardial effusion. There is no aortic aneurysm. Mediastinum/Nodes: No mediastinal hematoma or adenopathy. Small air is noted within esophagus. Lungs/Pleura: There is partially loculated small right pleural effusion. Some atelectasis or scarring is noted in right lower lobe posteriorly. No segmental infiltrate or pulmonary edema. No fibrotic changes are noted. No bronchiectasis. Upper Abdomen: The visualized upper abdomen shows status postcholecystectomy. No adrenal gland mass is noted. Visualized pancreas and spleen is unremarkable. Musculoskeletal: No destructive bony lesions are noted. Sagittal images of the spine shows degenerative changes thoracic spine. Review of the MIP images confirms the above findings. IMPRESSION: 1. No pulmonary embolus is noted. 2. There is small partially loculated right pleural effusion. Atelectasis or scarring is noted in right lower lobe posteriorly. 3. No mediastinal hematoma or adenopathy. 4. Status postcholecystectomy. 5. Degenerative changes thoracic spine. Electronically Signed   By: Natasha Mead M.D.   On: 09/08/2016 16:42   Dg Chest Right Decubitus  Result Date: 09/08/2016 CLINICAL DATA:  Hypoxia with pleural effusion EXAM: CHEST -  RIGHT DECUBITUS COMPARISON:  CT 09/08/2016, chest x-ray 09/08/2016 FINDINGS: Right lateral decubitus image demonstrates grossly clear left lung fields. A layering right pleural effusion is visualize with some loculation suggested at the right base. Limited evaluation of the pulmonary parenchyma due to positioning. IMPRESSION: Small layering right pleural effusion with mild loculation suggested at the mid to lower lung zone. Electronically Signed   By: Jasmine Pang M.D.   On: 09/08/2016 23:36   US Thoracentesis Asp Pleural Space W/img Guide  Result Date: 09/09/2016 INDICATION: 55 year old with history of BOOP and end-stage renal disease. Patient presents with hypoxia. CT demonstrates a small right pleural effusion. EXAM: ULTRASOUND GUIDED RIGHT THORACENTESIS MEDICATIONS: None. COMPLICATIONS: None immediate. PROCEDURE: An ultrasound guided thoracentesis was thoroughly discussed with the patient and questions answered. The benefits, risks, alternatives and complications were also discussed. The patient understands and wishes to proceed with the procedure. Written consent was obtained. Ultrasound was performed to  localize and mark an adequate pocket of fluid in the right chest. The area was then prepped and draped in the normal sterile fashion. 1% Lidocaine was used for local anesthesia. Under ultrasound guidance a 19 gauge, 10-cm, Yueh catheter was introduced. Thoracentesis was performed. The catheter was removed and a dressing applied. FINDINGS: A total of approximately 460 mL of bloody fluid was removed. Samples were sent to the laboratory as requested by the clinical team. IMPRESSION: Successful ultrasound guided right thoracentesis yielding 460 mL of pleural fluid. Electronically Signed   By: Richarda OverlieAdam  Henn M.D.   On: 09/09/2016 10:47     Medications:    . aspirin EC  81 mg Oral Daily  . atorvastatin  40 mg Oral Daily  . cinacalcet  30 mg Oral QHS  . DULoxetine  120 mg Oral Daily  . epoetin  (EPOGEN/PROCRIT) injection  4,000 Units Intravenous Q M,W,F-HD  . furosemide  80 mg Oral Daily  . gabapentin  300 mg Oral TID  . heparin subcutaneous  5,000 Units Subcutaneous Q8H  . insulin aspart  0-5 Units Subcutaneous QHS  . insulin aspart  0-9 Units Subcutaneous TID WC  . insulin aspart  30 Units Subcutaneous TID WC  . [START ON 09/10/2016] insulin glargine  40 Units Subcutaneous Daily  . ipratropium-albuterol  3 mL Nebulization TID  . irbesartan  300 mg Oral Daily  . levofloxacin (LEVAQUIN) IV  500 mg Intravenous Once   Followed by  . levofloxacin  250 mg Oral Q48H  . mouth rinse  15 mL Mouth Rinse BID  . [START ON 09/10/2016] methylPREDNISolone (SOLU-MEDROL) injection  40 mg Intravenous Daily  . metoprolol  50 mg Oral BID  . multivitamin  1 tablet Oral Daily  . pantoprazole  40 mg Oral Daily  . sevelamer carbonate  800 mg Oral TID  . sodium chloride flush  3 mL Intravenous Q12H  . thiamine  100 mg Oral Daily  . verapamil  120 mg Oral QHS   albuterol, albuterol, lidocaine-prilocaine, morphine injection  Assessment/ Plan:  55 y.o.African-American male  Diabetes mellitus type 1, hypertension, hyperlipidemia, gout, CHF, hypothyroidism, Vitamin D deficiency, peripheral neuropathy, diabetic retinopathy and obstructive sleep apnea on CPAP, anemia of CKD, SHPTH, BOOP 4/16. First dialysis 10/2012  MWF [facility] [address]   1. End Stage Renal Disease: MWF AVF Estimated dry weight 144.5 kg - acute indication for dialysis today.  We will plan for hemodialysis again tomorrow.  2. Shortness of breath with hx of BOOP/OSA - Awaiting further pulmonary input, has hx of BOOP, 2D echo completed.    3. Anemia of chronic kidney disease: hgb 11.0 at the moment, continue low dose epogen.    4. Secondary Hyperparathyroidism: Check phos with HD tomorrow, continue renvela and sensipar.     LOS: 2 Kavan Devan 11/28/20174:43 PM

## 2016-09-10 ENCOUNTER — Ambulatory Visit: Payer: BC Managed Care – PPO | Admitting: Internal Medicine

## 2016-09-10 ENCOUNTER — Inpatient Hospital Stay: Payer: Medicare Other

## 2016-09-10 DIAGNOSIS — J9601 Acute respiratory failure with hypoxia: Secondary | ICD-10-CM

## 2016-09-10 DIAGNOSIS — N186 End stage renal disease: Secondary | ICD-10-CM

## 2016-09-10 DIAGNOSIS — Z992 Dependence on renal dialysis: Secondary | ICD-10-CM

## 2016-09-10 DIAGNOSIS — J8489 Other specified interstitial pulmonary diseases: Secondary | ICD-10-CM

## 2016-09-10 DIAGNOSIS — I5033 Acute on chronic diastolic (congestive) heart failure: Secondary | ICD-10-CM

## 2016-09-10 LAB — GLUCOSE, CAPILLARY
GLUCOSE-CAPILLARY: 223 mg/dL — AB (ref 65–99)
GLUCOSE-CAPILLARY: 107 mg/dL — AB (ref 65–99)
Glucose-Capillary: 158 mg/dL — ABNORMAL HIGH (ref 65–99)
Glucose-Capillary: 187 mg/dL — ABNORMAL HIGH (ref 65–99)

## 2016-09-10 LAB — MISC LABCORP TEST (SEND OUT): LABCORP TEST CODE: 19588

## 2016-09-10 LAB — PHOSPHORUS: PHOSPHORUS: 7 mg/dL — AB (ref 2.5–4.6)

## 2016-09-10 LAB — BASIC METABOLIC PANEL
ANION GAP: 14 (ref 5–15)
BUN: 73 mg/dL — ABNORMAL HIGH (ref 6–20)
CHLORIDE: 94 mmol/L — AB (ref 101–111)
CO2: 28 mmol/L (ref 22–32)
CREATININE: 9.11 mg/dL — AB (ref 0.61–1.24)
Calcium: 6.9 mg/dL — ABNORMAL LOW (ref 8.9–10.3)
GFR calc non Af Amer: 6 mL/min — ABNORMAL LOW (ref >60)
GFR, EST AFRICAN AMERICAN: 7 mL/min — AB (ref >60)
Glucose, Bld: 246 mg/dL — ABNORMAL HIGH (ref 65–99)
Potassium: 4.7 mmol/L (ref 3.5–5.1)
SODIUM: 136 mmol/L (ref 135–145)

## 2016-09-10 LAB — PH, BODY FLUID: pH, Body Fluid: 7.8

## 2016-09-10 MED ORDER — PREDNISONE 10 MG PO TABS
10.0000 mg | ORAL_TABLET | Freq: Every day | ORAL | Status: DC
  Administered 2016-09-10 – 2016-09-11 (×2): 10 mg via ORAL
  Filled 2016-09-10 (×2): qty 1

## 2016-09-10 MED ORDER — INSULIN ASPART 100 UNIT/ML ~~LOC~~ SOLN
20.0000 [IU] | Freq: Three times a day (TID) | SUBCUTANEOUS | Status: DC
  Administered 2016-09-10 – 2016-09-11 (×3): 20 [IU] via SUBCUTANEOUS
  Filled 2016-09-10 (×3): qty 20

## 2016-09-10 NOTE — Progress Notes (Signed)
Dialysis started 

## 2016-09-10 NOTE — Plan of Care (Signed)
Problem: Fluid Volume: Goal: Ability to maintain a balanced intake and output will improve Outcome: Not Progressing Low urine output continues, edema to legs/feet remain at 2+.  Tolerated Bipap while sleeping this shift.

## 2016-09-10 NOTE — Progress Notes (Signed)
Dialysis complete

## 2016-09-10 NOTE — Consult Note (Signed)
[facility] Consultation      Assessment and Plan:  Acute hypoxic respiratory failure- resolving well.  -Likely multifactorial from volume overload, hypertensive urgency, pulmonary edema, volume overload, ESRD.  -Continue bipap qhs, lasix IV, BP controled.  -cont with supplemental oxygen - 3L at rest and with exertion -overall with good clinical improvement, this episode of more of CHF than pulmonary issues. PCCM will signoff at this time.    Pleural Effusion, Right - s/p thoracentesis - bloody tap, small amount removed 460cc - will follow up studies as an outpatient  Hx of BOOP - adequately treated - cont with Prednisone 10mg  daily (pulmonary will taper as an outpatient)  DCHF - EF 50-55% -cont with HTN meds and good BP control   Patient has follow up already in 1 week at [facility].  Thank you for consulting [facility], we will signoff at this time.  Please feel free to contact us with any questions at [phone] (please enter 7-digits).    Date: 09/10/2016  MRN# [id number] Ryan FellsMyron W Arnold 10/02/1961  Referring Physician: Dr. Isabelle CourseLord  Ryan Arnold is a 55 y.o. old male seen in consultation for chief complaint of:    Chief Complaint  Patient presents with  . Respiratory Distress    HPI:   The patient is a 55 yo male with a history of BOOP, ESRD, he has seen Dr. Dema SeverinMungal in the office.  He uses bipap at home, he presents with respiratory difficulty over the past few days which has been progressive. He takes prednisone 20 mg daily.  He was brought by EMS in bipap, and has maintained on this since that time.  He is speaking clearly through his bipap and appears to have no conversational dyspnea.   He was taken off of bipap to RA for 5 min with sat drop to 85-88%; without dyspnea. He was placed on [place] on 6L; with sat increase to 100%. Patient tell me he is on 3L at baseline.   Review of CXr images shows  chronically right sided elevated diaphragm with mild pulmonary edema and atelectasis with obesity, BNP is elevated at 1690.   SUBJECTIVE: S/p thoracentesis R side, 460ml of bloody fluid removed, labs pending. Overall doing well, back to [place].  Seen in dialysis  PMHX:   Past Medical History:  Diagnosis Date  . Anemia of chronic disease   . BOOP (bronchiolitis obliterans with organizing pneumonia) (HCC)    a. 01/2018 s/p R thoracoscopy/Bx confirming BOOP;  b. 02/2018 high dose steroids started.  . Chest pain    a. 06/2014 Myoview ([facility]) no ischemia/infarct, nl EF.  [name]. Chronic diastolic CHF (congestive heart failure) (HCC)    a. 12/2014 Echo: EF 50-55%, mild conc LVH, mildly dil LA.  . End stage renal disease (HCC)    a. on dialysis; still makes urine.  . Essential hypertension   . Hyperlipemia   . Morbid obesity (HCC)   . OSA treated with BiPAP   . Recurrent pneumonia    a. 2/2 BOOP.  [name]. Renal insufficiency   . Secondary hyperparathyroidism (HCC)   . Thyroid disease   . Type 1 diabetes mellitus (HCC)    Surgical Hx:  Past Surgical History:  Procedure Laterality Date  . cataract Bilateral   . GALLBLADDER SURGERY    . KNEE SURGERY Right   . LUNG BIOPSY    . SHOULDER SURGERY Left    Family Hx:  Family History  Problem Relation Age of Onset  . Diabetes Mother     died @ 6562.  [name]. Hypertension Mother   . Hypertension Maternal Grandmother    Social Hx:   Social History  Substance Use Topics  . Smoking status: Never Smoker  . Smokeless tobacco: Never Used  . Alcohol use No   Medication:   Reviewed.     Allergies:  Oxycodone-acetaminophen and Tape  Review of Systems: Gen:  Denies  fever, sweats, chills HEENT: Denies blurred vision, double vision.  Cvc:  No dizziness, chest pain. Resp:   Denies cough Ryan sputum production. Admits SOB Gi: Denies swallowing difficulty, stomach pain. Gu:  Denies bladder incontinence, burning urine Ext:   No Joint pain, stiffness. Skin: No  skin rash,  hives  Endoc:  No polyuria, polydipsia. Psych: No depression, insomnia. Other:  All other systems were reviewed with the patient and were negative other that what is mentioned in the HPI.   Physical Examination:   VS: BP (!) 141/83 (BP Location: Right Arm)   Pulse 66   Temp 97.8 F (36.6 C) (Oral)   Resp 13   Ht 6\' 1"  (1.854 m)   Wt (!) 321 lb 3.4 oz (145.7 kg)   SpO2 100%   BMI 42.38 kg/m   General Appearance: No distress  Neuro:without focal findings,  speech normal,  HEENT: PERRLA, EOM intact.   Pulmonary: normal breath sounds, No wheezing, fine basilar crackles.  CardiovascularNormal S1,S2.  No m/r/g.   Abdomen: Benign, Soft, non-tender. Renal:  No costovertebral tenderness  GU:  No performed at this time. Endoc: No evident thyromegaly, no signs of acromegaly. Skin:   warm, no rashes, no ecchymosis  Extremities: normal, no cyanosis, clubbing.  Other findings:    LABORATORY PANEL:   CBC  Recent Labs Lab 09/08/16 0348  WBC 12.6*  HGB 11.0*  HCT 34.9*  PLT 357   ------------------------------------------------------------------------------------------------------------------  Chemistries   Recent Labs Lab 09/10/16 0625  NA 136  K 4.7  CL 94*  CO2 28  GLUCOSE 246*  BUN 73*  CREATININE 9.11*  CALCIUM 6.9*   ------------------------------------------------------------------------------------------------------------------  Cardiac Enzymes  Recent Labs Lab 09/08/16 1054  TROPONINI 0.05*   ------------------------------------------------------------  RADIOLOGY:  Dg Chest 1 View  Result Date: 09/09/2016 CLINICAL DATA:  Thoracentesis. EXAM: CHEST 1 VIEW COMPARISON:  Ultrasound 09/09/2016.  Chest x-ray 09/08/2016. FINDINGS: Mediastinum and hilar structures stable. Stable cardiomegaly. Interim right thoracentesis. No pneumothorax. Mild atelectatic changes noted throughout the right lung. Pleural thickening noted on the right. IMPRESSION:  1. Right thoracentesis. No pneumothorax . Mild atelectatic changes noted throughout the right lung. Mild right pleural thickening noted. 2.  Stable cardiomegaly. Electronically Signed   By: Maisie Fushomas  Register   On: 09/09/2016 10:23   Ct Angio Chest Pe W Ryan Wo Contrast  Result Date: 09/08/2016 CLINICAL DATA:  Shortness of Breath EXAM: CT ANGIOGRAPHY CHEST WITH CONTRAST TECHNIQUE: Multidetector CT imaging of the chest was performed using the standard protocol during bolus administration of intravenous contrast. Multiplanar CT image reconstructions and MIPs were obtained to evaluate the vascular anatomy. CONTRAST:  75 cc Isovue COMPARISON:  CT chest 01/08/2015 FINDINGS: Cardiovascular: No pulmonary embolus is noted. Heart size within normal limits. Trace anterior pericardial effusion. There is no aortic aneurysm. Mediastinum/Nodes: No mediastinal hematoma Ryan adenopathy. Small air is noted within esophagus. Lungs/Pleura: There is partially loculated small right pleural effusion. Some atelectasis Ryan scarring is noted in right lower lobe posteriorly. No segmental infiltrate Ryan pulmonary edema. No fibrotic changes  are noted. No bronchiectasis. Upper Abdomen: The visualized upper abdomen shows status postcholecystectomy. No adrenal gland mass is noted. Visualized pancreas and spleen is unremarkable. Musculoskeletal: No destructive bony lesions are noted. Sagittal images of the spine shows degenerative changes thoracic spine. Review of the MIP images confirms the above findings. IMPRESSION: 1. No pulmonary embolus is noted. 2. There is small partially loculated right pleural effusion. Atelectasis Ryan scarring is noted in right lower lobe posteriorly. 3. No mediastinal hematoma Ryan adenopathy. 4. Status postcholecystectomy. 5. Degenerative changes thoracic spine. Electronically Signed   By: Natasha Mead M.D.   On: 09/08/2016 16:42   Dg Chest Right Decubitus  Result Date: 09/08/2016 CLINICAL DATA:  Hypoxia with pleural  effusion EXAM: CHEST - RIGHT DECUBITUS COMPARISON:  CT 09/08/2016, chest x-ray 09/08/2016 FINDINGS: Right lateral decubitus image demonstrates grossly clear left lung fields. A layering right pleural effusion is visualize with some loculation suggested at the right base. Limited evaluation of the pulmonary parenchyma due to positioning. IMPRESSION: Small layering right pleural effusion with mild loculation suggested at the mid to lower lung zone. Electronically Signed   By: Jasmine Pang M.D.   On: 09/08/2016 23:36   US Thoracentesis Asp Pleural Space W/img Guide  Result Date: 09/09/2016 INDICATION: 55 year old with history of BOOP and end-stage renal disease. Patient presents with hypoxia. CT demonstrates a small right pleural effusion. EXAM: ULTRASOUND GUIDED RIGHT THORACENTESIS MEDICATIONS: None. COMPLICATIONS: None immediate. PROCEDURE: An ultrasound guided thoracentesis was thoroughly discussed with the patient and questions answered. The benefits, risks, alternatives and complications were also discussed. The patient understands and wishes to proceed with the procedure. Written consent was obtained. Ultrasound was performed to localize and mark an adequate pocket of fluid in the right chest. The area was then prepped and draped in the normal sterile fashion. 1% Lidocaine was used for local anesthesia. Under ultrasound guidance a 19 gauge, 10-cm, Yueh catheter was introduced. Thoracentesis was performed. The catheter was removed and a dressing applied. FINDINGS: A total of approximately 460 mL of bloody fluid was removed. Samples were sent to the laboratory as requested by the clinical team. IMPRESSION: Successful ultrasound guided right thoracentesis yielding 460 mL of pleural fluid. Electronically Signed   By: Richarda Overlie M.D.   On: 09/09/2016 10:47       Thank  you for the consultation and for allowing [facility] to assist in the care of your patient. Our recommendations  are noted above.  Please contact us if we can be of further service.   Stephanie Acre, MD [facility] Pager [phone] (please enter 7-digits) On Call Pager - [phone] (please enter 7-digits)  09/10/2016

## 2016-09-10 NOTE — Progress Notes (Signed)
[employer] [facility] [place] [facility]   PATIENT NAME: Ryan Arnold    MR#:  [id number]  DATE OF BIRTH:  02/04/1961  SUBJECTIVE:  Better shortness of breath, on hemodialysis REVIEW OF SYSTEMS:    Review of Systems  Constitutional: Negative.  Negative for chills, fever and malaise/fatigue.  HENT: Negative.  Negative for ear discharge, ear pain, hearing loss, nosebleeds and sore throat.   Eyes: Negative.  Negative for blurred vision and pain.  Respiratory: Positive for shortness of breath (better). Negative for cough, hemoptysis and wheezing.   Cardiovascular: Negative.  Negative for chest pain, palpitations and leg swelling.  Gastrointestinal: Negative.  Negative for abdominal pain, blood in stool, diarrhea, nausea and vomiting.  Genitourinary: Negative.  Negative for dysuria.  Musculoskeletal: Negative.  Negative for back pain.  Skin: Negative.   Neurological: Negative for dizziness, tremors, speech change, focal weakness, seizures and headaches.  Endo/Heme/Allergies: Negative.  Does not bruise/bleed easily.  Psychiatric/Behavioral: Negative.  Negative for depression, hallucinations and suicidal ideas.    Tolerating Diet:yes DRUG ALLERGIES:   Allergies  Allergen Reactions  . Oxycodone-Acetaminophen Nausea And Vomiting  . Tape Other (See Comments)    Paper tape caused blisters    VITALS:  Blood pressure (!) 162/74, pulse 72, temperature 98.2 F (36.8 C), temperature source Oral, resp. rate 16, height 6\' 1"  (1.854 m), weight (!) 309 lb 14.4 oz (140.6 kg), SpO2 99 %.  PHYSICAL EXAMINATION:   Physical Exam  Constitutional: He is oriented to person, place, and time. No distress.  obese  HENT:  Head: Normocephalic.  Eyes: No scleral icterus.  Neck: Normal range of motion. Neck supple. No JVD present. No tracheal deviation present.  Cardiovascular: Normal rate, regular rhythm and normal heart sounds.  Exam reveals no gallop and no friction rub.   No murmur  heard. Pulmonary/Chest: Effort normal and breath sounds normal. No respiratory distress. He has no wheezes. He has no rales. He exhibits no tenderness.  Abdominal: Soft. Bowel sounds are normal. He exhibits no distension and no mass. There is no tenderness. There is no rebound and no guarding.  Musculoskeletal: Normal range of motion. He exhibits no edema.  Neurological: He is alert and oriented to person, place, and time.  Skin: Skin is warm. No rash noted. No erythema.  Psychiatric: Affect and judgment normal.      LABORATORY PANEL:   CBC  Recent Labs Lab 09/08/16 0348  WBC 12.6*  HGB 11.0*  HCT 34.9*  PLT 357   ------------------------------------------------------------------------------------------------------------------  Chemistries   Recent Labs Lab 09/10/16 0625  NA 136  K 4.7  CL 94*  CO2 28  GLUCOSE 246*  BUN 73*  CREATININE 9.11*  CALCIUM 6.9*   ------------------------------------------------------------------------------------------------------------------  Cardiac Enzymes  Recent Labs Lab 09/07/16 2213 09/08/16 0348 09/08/16 1054  TROPONINI 0.07* 0.05* 0.05*   ------------------------------------------------------------------------------------------------------------------  RADIOLOGY:  Dg Chest 1 View  Result Date: 09/09/2016 CLINICAL DATA:  Thoracentesis. EXAM: CHEST 1 VIEW COMPARISON:  Ultrasound 09/09/2016.  Chest x-ray 09/08/2016. FINDINGS: Mediastinum and hilar structures stable. Stable cardiomegaly. Interim right thoracentesis. No pneumothorax. Mild atelectatic changes noted throughout the right lung. Pleural thickening noted on the right. IMPRESSION: 1. Right thoracentesis. No pneumothorax . Mild atelectatic changes noted throughout the right lung. Mild right pleural thickening noted. 2.  Stable cardiomegaly. Electronically Signed   By: Maisie Fushomas  Register   On: 09/09/2016 10:23   Dg Chest Right Decubitus  Result Date:  09/08/2016 CLINICAL DATA:  Hypoxia with pleural effusion EXAM: CHEST -  RIGHT DECUBITUS COMPARISON:  CT 09/08/2016, chest x-ray 09/08/2016 FINDINGS: Right lateral decubitus image demonstrates grossly clear left lung fields. A layering right pleural effusion is visualize with some loculation suggested at the right base. Limited evaluation of the pulmonary parenchyma due to positioning. IMPRESSION: Small layering right pleural effusion with mild loculation suggested at the mid to lower lung zone. Electronically Signed   By: Jasmine PangKim  Fujinaga M.D.   On: 09/08/2016 23:36   [place] Thoracentesis Asp Pleural Space W/img Guide  Result Date: 09/09/2016 INDICATION: 55 year old with history of BOOP and end-stage renal disease. Patient presents with hypoxia. CT demonstrates a small right pleural effusion. EXAM: ULTRASOUND GUIDED RIGHT THORACENTESIS MEDICATIONS: None. COMPLICATIONS: None immediate. PROCEDURE: An ultrasound guided thoracentesis was thoroughly discussed with the patient and questions answered. The benefits, risks, alternatives and complications were also discussed. The patient understands and wishes to proceed with the procedure. Written consent was obtained. Ultrasound was performed to localize and mark an adequate pocket of fluid in the right chest. The area was then prepped and draped in the normal sterile fashion. 1% Lidocaine was used for local anesthesia. Under ultrasound guidance a 19 gauge, 10-cm, Yueh catheter was introduced. Thoracentesis was performed. The catheter was removed and a dressing applied. FINDINGS: A total of approximately 460 mL of bloody fluid was removed. Samples were sent to the laboratory as requested by the clinical team. IMPRESSION: Successful ultrasound guided right thoracentesis yielding 460 mL of pleural fluid. Electronically Signed   By: Richarda OverlieAdam  Henn M.D.   On: 09/09/2016 10:47     ASSESSMENT AND PLAN:   55 year old male with a history of BOOP, ESRD on hemodialysis, diabetes with  retinopathy and neuropathy and OSA on CPAP who presents with shortness of breath.  1. Acute on chronic hypoxic respiratory failure which is multifactorial due to volume overload, hypertensive urgency and underlying BOOP and Right pleural effusion. CT ordered to evaluate for PE was negative however does show right pleural effusion.  Patient underwent thoracentesis.  Follow-up culture.  Continue steroids and nebulizer treatments with inhaler discontinue Levaquin since CXR did not show any PNA.  2. Severe Hyperkalemia without EKG changes: Resolved with dialysis.   3. ESRD on hemodialysis with fluid overload: Continue dialysis as per recommendations by nephrology Continue  SELevemir  4. Hypertensive urgency: Patient reports uncontrolled blood pressure for some time and not only related to steroids. Increased Avapro to 300 mg daily. Continue metoprolol  5. Obstructive sleep apnea: Continue BiPAP at night  6. Type 1 diabetes: Continue sliding scale insulin, Lantus and NovoLog Appreciate diabetes consult   7. Diabetic neuropathy: Continue gabapentin  8. Sharp shooting headpains at sounds neuropathic in nature:  Dr. Jodi Mourningeynold recommended starting VERAPAMIL 120 mg at bedtime for headache prophylaxis and continue current dose of gabapentin Follow-up with neurology in 4-6 weeks  Discussed case with Dr. Thad Rangereynolds. Management plans discussed with the patient and he is in agreement.  CODE STATUS: full  TOTAL TIME TAKING CARE OF THIS PATIENT: 32 minutes.    D/w  nurse  POSSIBLE D/C 1-2 days, DEPENDING ON CLINICAL CONDITION.   Shaune Pollackhen, Keyshawn Hellwig M.D on 09/10/2016 at 4:37 PM  Between 7am to 6pm - Pager - [phone] After 6pm go to [email or web address] - password [email or web address]  [facility]  Office  [phone]  CC: Primary care physician; Tommie SamsJayce G Cook, DO  Note: This dictation was prepared with Dragon dictation along with smaller phrase technology. Any transcriptional errors that  result from this process are unintentional.

## 2016-09-10 NOTE — Progress Notes (Signed)
Inpatient Diabetes Program Recommendations  AACE/ADA: New Consensus Statement on Inpatient Glycemic Control (2015)  Target Ranges:  Prepandial:   less than 140 mg/dL      Peak postprandial:   less than 180 mg/dL (1-2 hours)      Critically ill patients:  140 - 180 mg/dL   Lab Results  Component Value Date   GLUCAP 223 (H) 09/10/2016   HGBA1C 7.5 (H) 09/08/2016   Results for Ryan FellsBIGELOW, Hulbert W (MRN [id number]) as of 09/10/2016 08:27  Ref. Range 09/09/2016 07:39 09/09/2016 11:55 09/09/2016 17:09 09/09/2016 20:50 09/10/2016 07:44  Glucose-Capillary Latest Ref Range: 65 - 99 mg/dL 782445 (H) 956341 (H) 213126 (H) 214 (H) 223 (H)   Review of Glycemic Control  Diabetes history:       DM, ESRD on HD (M-W-F), Obesity, Steroids this admission, HGB=11 Outpatient Diabetes medications:       Tresiba Insulin- 40 units daily, Novolog 40 units TIDWC Current orders for Inpatient glycemic control:       Novolog sensitive correction (0-9 units TIDAC and 0-5 units QHS), Novolog Meal Coverage 30 units TIDAC, Lantus 40 units daily, tapering steroids (now daily dosing)  Inpatient Diabetes Program Recommendations:      As taper steroids, please consider tapering Meal Coverage to Novolog 20 units TIDAC if patient eats > 50% of meal today.  Thank you,  Kristine LineaKaren Taresa [place], RN, BSN Diabetes Coordinator Inpatient Diabetes Program [phone] (Team Pager)

## 2016-09-10 NOTE — Progress Notes (Signed)
Post dialysis 

## 2016-09-10 NOTE — Progress Notes (Signed)
Subjective:  Patient seen and evaluated during hemodialysis. He appears to be tolerating this quite well.   Objective:  Vital signs in last 24 hours:  Temp:  [97.7 F (36.5 C)-98.4 F (36.9 C)] 97.8 F (36.6 C) (11/29 1005) Pulse Rate:  [65-75] 66 (11/29 1030) Resp:  [13-18] 13 (11/29 1030) BP: (141-162)/(65-83) 141/83 (11/29 1030) SpO2:  [99 %-100 %] 100 % (11/29 1030) FiO2 (%):  [32 %] 32 % (11/28 1500) Weight:  [142.2 kg (313 lb 9.6 oz)-145.7 kg (321 lb 3.4 oz)] 145.7 kg (321 lb 3.4 oz) (11/29 1005)  Weight change: -2.452 kg (-5 lb 6.5 oz) Filed Weights   09/09/16 0556 09/10/16 0532 09/10/16 1005  Weight: (!) 140.7 kg (310 lb 3.2 oz) (!) 142.2 kg (313 lb 9.6 oz) (!) 145.7 kg (321 lb 3.4 oz)    Intake/Output:    Intake/Output Summary (Last 24 hours) at 09/10/16 1053 Last data filed at 09/10/16 0955  Gross per 24 hour  Intake              720 ml  Output              250 ml  Net              470 ml     Physical Exam: General: No acute distress, sitting up in bed   HEENT Anicteric, moist oral mucous membranes   Neck Supple   Pulm/lungs Right greater than left basilar rales, nasal cannula oxygen   CVS/Heart Regular, no rub or gallop   Abdomen:  Soft, nontender, nondistended, obese   Extremities: 1+ pitting edema   Neurologic: Alert, oriented, follows commands  Skin: No acute rashes   Access: Upper arm AV fistula - Left       Basic Metabolic Panel:   Recent Labs Lab 09/07/16 1327 09/08/16 0348 09/08/16 1054 09/09/16 0436 09/10/16 0625  NA 141 134*  --  136 136  K 5.8* 7.0*  --  4.8 4.7  CL 100* 97*  --  95* 94*  CO2 28 24  --  29 28  GLUCOSE 240* 536*  --  324* 246*  BUN 77* 56*  --  42* 73*  CREATININE 11.96* 8.70*  --  6.27* 9.11*  CALCIUM 7.4* 7.8*  --  7.5* 6.9*  PHOS  --   --  5.6*  --   --      CBC:  Recent Labs Lab 09/07/16 1327 09/08/16 0348  WBC 12.4* 12.6*  NEUTROABS 10.6*  --   HGB 10.5* 11.0*  HCT 33.2* 34.9*  MCV 93.1 93.7   PLT 370 357      Microbiology:  Recent Results (from the past 720 hour(s))  MRSA PCR Screening     Status: None   Collection Time: 08/29/16  3:45 AM  Result Value Ref Range Status   MRSA by PCR NEGATIVE NEGATIVE Final    Comment:        The GeneXpert MRSA Assay (FDA approved for NASAL specimens only), is one component of a comprehensive MRSA colonization surveillance program. It is not intended to diagnose MRSA infection nor to guide or monitor treatment for MRSA infections.   Body fluid culture     Status: None (Preliminary result)   Collection Time: 09/09/16 10:00 AM  Result Value Ref Range Status   Specimen Description PLEURAL  Final   Special Requests NONE  Final   Gram Stain   Final    FEW WBC PRESENT, PREDOMINANTLY MONONUCLEAR NO  ORGANISMS SEEN Performed at [facility]    Culture PENDING  Incomplete   Report Status PENDING  Incomplete    Coagulation Studies: No results for input(s): LABPROT, INR in the last 72 hours.  Urinalysis: No results for input(s): COLORURINE, LABSPEC, PHURINE, GLUCOSEU, HGBUR, BILIRUBINUR, KETONESUR, PROTEINUR, UROBILINOGEN, NITRITE, LEUKOCYTESUR in the last 72 hours.  Invalid input(s): APPERANCEUR    Imaging: Dg Chest 1 View  Result Date: 09/09/2016 CLINICAL DATA:  Thoracentesis. EXAM: CHEST 1 VIEW COMPARISON:  Ultrasound 09/09/2016.  Chest x-ray 09/08/2016. FINDINGS: Mediastinum and hilar structures stable. Stable cardiomegaly. Interim right thoracentesis. No pneumothorax. Mild atelectatic changes noted throughout the right lung. Pleural thickening noted on the right. IMPRESSION: 1. Right thoracentesis. No pneumothorax . Mild atelectatic changes noted throughout the right lung. Mild right pleural thickening noted. 2.  Stable cardiomegaly. Electronically Signed   By: Maisie Fus  Register   On: 09/09/2016 10:23   Ct Angio Chest Pe W Or Wo Contrast  Result Date: 09/08/2016 CLINICAL DATA:  Shortness of Breath EXAM: CT  ANGIOGRAPHY CHEST WITH CONTRAST TECHNIQUE: Multidetector CT imaging of the chest was performed using the standard protocol during bolus administration of intravenous contrast. Multiplanar CT image reconstructions and MIPs were obtained to evaluate the vascular anatomy. CONTRAST:  75 cc Isovue COMPARISON:  CT chest 01/08/2015 FINDINGS: Cardiovascular: No pulmonary embolus is noted. Heart size within normal limits. Trace anterior pericardial effusion. There is no aortic aneurysm. Mediastinum/Nodes: No mediastinal hematoma or adenopathy. Small air is noted within esophagus. Lungs/Pleura: There is partially loculated small right pleural effusion. Some atelectasis or scarring is noted in right lower lobe posteriorly. No segmental infiltrate or pulmonary edema. No fibrotic changes are noted. No bronchiectasis. Upper Abdomen: The visualized upper abdomen shows status postcholecystectomy. No adrenal gland mass is noted. Visualized pancreas and spleen is unremarkable. Musculoskeletal: No destructive bony lesions are noted. Sagittal images of the spine shows degenerative changes thoracic spine. Review of the MIP images confirms the above findings. IMPRESSION: 1. No pulmonary embolus is noted. 2. There is small partially loculated right pleural effusion. Atelectasis or scarring is noted in right lower lobe posteriorly. 3. No mediastinal hematoma or adenopathy. 4. Status postcholecystectomy. 5. Degenerative changes thoracic spine. Electronically Signed   By: Natasha Mead M.D.   On: 09/08/2016 16:42   Dg Chest Right Decubitus  Result Date: 09/08/2016 CLINICAL DATA:  Hypoxia with pleural effusion EXAM: CHEST - RIGHT DECUBITUS COMPARISON:  CT 09/08/2016, chest x-ray 09/08/2016 FINDINGS: Right lateral decubitus image demonstrates grossly clear left lung fields. A layering right pleural effusion is visualize with some loculation suggested at the right base. Limited evaluation of the pulmonary parenchyma due to positioning.  IMPRESSION: Small layering right pleural effusion with mild loculation suggested at the mid to lower lung zone. Electronically Signed   By: Jasmine Pang M.D.   On: 09/08/2016 23:36   US Thoracentesis Asp Pleural Space W/img Guide  Result Date: 09/09/2016 INDICATION: 55 year old with history of BOOP and end-stage renal disease. Patient presents with hypoxia. CT demonstrates a small right pleural effusion. EXAM: ULTRASOUND GUIDED RIGHT THORACENTESIS MEDICATIONS: None. COMPLICATIONS: None immediate. PROCEDURE: An ultrasound guided thoracentesis was thoroughly discussed with the patient and questions answered. The benefits, risks, alternatives and complications were also discussed. The patient understands and wishes to proceed with the procedure. Written consent was obtained. Ultrasound was performed to localize and mark an adequate pocket of fluid in the right chest. The area was then prepped and draped in the normal sterile fashion. 1% Lidocaine was used  for local anesthesia. Under ultrasound guidance a 19 gauge, 10-cm, Yueh catheter was introduced. Thoracentesis was performed. The catheter was removed and a dressing applied. FINDINGS: A total of approximately 460 mL of bloody fluid was removed. Samples were sent to the laboratory as requested by the clinical team. IMPRESSION: Successful ultrasound guided right thoracentesis yielding 460 mL of pleural fluid. Electronically Signed   By: Richarda OverlieAdam  Henn M.D.   On: 09/09/2016 10:47     Medications:    . aspirin EC  81 mg Oral Daily  . atorvastatin  40 mg Oral Daily  . cinacalcet  30 mg Oral QHS  . DULoxetine  120 mg Oral Daily  . epoetin (EPOGEN/PROCRIT) injection  4,000 Units Intravenous Q M,W,F-HD  . furosemide  80 mg Oral Daily  . gabapentin  300 mg Oral TID  . heparin subcutaneous  5,000 Units Subcutaneous Q8H  . insulin aspart  0-5 Units Subcutaneous QHS  . insulin aspart  0-9 Units Subcutaneous TID WC  . insulin aspart  20 Units Subcutaneous TID  WC  . insulin glargine  40 Units Subcutaneous Daily  . ipratropium-albuterol  3 mL Nebulization TID  . irbesartan  300 mg Oral Daily  . levofloxacin (LEVAQUIN) IV  500 mg Intravenous Once   Followed by  . levofloxacin  250 mg Oral Q48H  . mouth rinse  15 mL Mouth Rinse BID  . methylPREDNISolone (SOLU-MEDROL) injection  40 mg Intravenous Daily  . metoprolol  50 mg Oral BID  . multivitamin  1 tablet Oral Daily  . pantoprazole  40 mg Oral Daily  . sevelamer carbonate  800 mg Oral TID  . sodium chloride flush  3 mL Intravenous Q12H  . thiamine  100 mg Oral Daily  . verapamil  120 mg Oral QHS   albuterol, albuterol, lidocaine-prilocaine, morphine injection  Assessment/ Plan:  55 y.o.African-American male  Diabetes mellitus type 1, hypertension, hyperlipidemia, gout, CHF, hypothyroidism, Vitamin D deficiency, peripheral neuropathy, diabetic retinopathy and obstructive sleep apnea on CPAP, anemia of CKD, SHPTH, BOOP 4/16. First dialysis 10/2012  MWF [facility] [address]   1. End Stage Renal Disease: MWF AVF Estimated dry weight 144.5 kg - Patient seen and evaluated during hemodialysis today. Ultrafiltration target is 2 kg.  2. Shortness of breath with hx of BOOP/OSA - 2-D echocardiogram completed. Left ventricular ejection fraction preserved. Mildly elevated pulmonary artery pressure. Awaiting further input from pulmonary.   3. Anemia of chronic kidney disease: Hold off on Epogen for now. Hemoglobin 11.0 at last check.  4. Secondary Hyperparathyroidism: Awaiting phosphorus from today. Otherwise continue Renvela and Sensipar.   LOS: 3 Patrisia Faeth 11/29/201710:53 AM

## 2016-09-10 NOTE — Care Management Important Message (Signed)
Important Message  Patient Details  Name: Ryan [facility] MRN: [id number] Date of Birth: 11/11/1960   Medicare Important Message Given:  Yes    Eber HongGreene, Dionysios Massman R, RN 09/10/2016, 2:34 PM

## 2016-09-10 NOTE — Progress Notes (Signed)
Pre Dialysis 

## 2016-09-11 ENCOUNTER — Ambulatory Visit: Payer: BC Managed Care – PPO | Admitting: Internal Medicine

## 2016-09-11 ENCOUNTER — Telehealth: Payer: Self-pay | Admitting: *Deleted

## 2016-09-11 ENCOUNTER — Ambulatory Visit: Admit: 2016-09-11 | Payer: BC Managed Care – PPO | Admitting: Gastroenterology

## 2016-09-11 DIAGNOSIS — Z992 Dependence on renal dialysis: Secondary | ICD-10-CM | POA: Diagnosis not present

## 2016-09-11 DIAGNOSIS — N186 End stage renal disease: Secondary | ICD-10-CM | POA: Diagnosis not present

## 2016-09-11 LAB — PATHOLOGIST SMEAR REVIEW

## 2016-09-11 LAB — GLUCOSE, CAPILLARY
Glucose-Capillary: 172 mg/dL — ABNORMAL HIGH (ref 65–99)
Glucose-Capillary: 121 mg/dL — ABNORMAL HIGH (ref 65–99)

## 2016-09-11 SURGERY — COLONOSCOPY WITH PROPOFOL
Anesthesia: General

## 2016-09-11 MED ORDER — VERAPAMIL HCL ER 120 MG PO TBCR: 120.0000 mg | EXTENDED_RELEASE_TABLET | Freq: Every day | ORAL | 1 refills | Status: DC

## 2016-09-11 NOTE — Discharge Summary (Signed)
[facility] [place] at [facility]   PATIENT NAME: Ryan [facility]    MR#:  [id number]  DATE OF BIRTH:  04-Oct-1961  DATE OF ADMISSION:  09/07/2016   ADMITTING PHYSICIAN: Ramonita Lab, MD  DATE OF DISCHARGE: 09/11/2016  1:11 PM  PRIMARY CARE PHYSICIAN: Tommie Sams, DO   ADMISSION DIAGNOSIS:  Hyperkalemia [E87.5] Acute pulmonary edema (HCC) [J81.0] SOB (shortness of breath) [R06.02] COPD exacerbation (HCC) [J44.1] Acute renal failure, unspecified acute renal failure type (HCC) [N17.9] DISCHARGE DIAGNOSIS:  Active Problems:   Acute respiratory failure (HCC)  SECONDARY DIAGNOSIS:   Past Medical History:  Diagnosis Date  . Anemia of chronic disease   . BOOP (bronchiolitis obliterans with organizing pneumonia) (HCC)    a. 01/2018 s/p R thoracoscopy/Bx confirming BOOP;  b. 02/2018 high dose steroids started.  . Chest pain    a. 06/2014 Myoview ([facility]) no ischemia/infarct, nl EF.  [name] Chronic diastolic CHF (congestive heart failure) (HCC)    a. 12/2014 Echo: EF 50-55%, mild conc LVH, mildly dil LA.  . End stage renal disease (HCC)    a. on dialysis; still makes urine.  . Essential hypertension   . Hyperlipemia   . Morbid obesity (HCC)   . OSA treated with BiPAP   . Recurrent pneumonia    a. 2/2 BOOP.  [name] Renal insufficiency   . Secondary hyperparathyroidism (HCC)   . Thyroid disease   . Type 1 diabetes mellitus [facility])    HOSPITAL COURSE:  55 year old male with a history of BOOP, ESRD on hemodialysis, diabetes with retinopathy and neuropathy and OSA on CPAP who presents with shortness of breath.  1. Acute on chronic hypoxic respiratory failure which is multifactorial due to volume overload, hypertensive urgency and underlying BOOP and Right pleural effusion. CT ordered to evaluate for PE was negative however does show right pleural effusion.  Patient underwent thoracentesis.  Follow-up fluid test with pulmonary physician as outpatient. Continue steroids and  nebulizer treatments with inhaler discontinued Levaquin since CXR did not show any PNA. Continue oxygen by nasal cannula 1-2 L.  2. Severe Hyperkalemia without EKG changes: Resolved with dialysis.   3. ESRD on hemodialysis with fluid overload: Continue dialysis as per recommendations by nephrology Continue  SELevemir  4. Hypertensive urgency: Patient reports uncontrolled blood pressure for some time and not only related to steroids. Increased Avapro to 300 mg daily. Continue metoprolol  5. Obstructive sleep apnea: Continue BiPAP at night  6. Type 1 diabetes: Continue sliding scale insulin, Lantus and NovoLog Appreciate diabetes consult   7. Diabetic neuropathy: Continue gabapentin  8. Sharp shooting headpains at sounds neuropathic in nature:  Dr. Jodi Mourning recommended starting VERAPAMIL 120 mg at bedtime for headache prophylaxis and continue current dose of gabapentin Follow-up with neurology in 4-6 weeks  Discussed case with Dr. Thad Ranger. DISCHARGE CONDITIONS:  Stable, discharged to home today. CONSULTS OBTAINED:  Treatment Team:  Kym Groom, MD Mady Haagensen, MD Thana Farr, MD DRUG ALLERGIES:   Allergies  Allergen Reactions  . Oxycodone-Acetaminophen Nausea And Vomiting  . Tape Other (See Comments)    Paper tape caused blisters   DISCHARGE MEDICATIONS:     Medication List    TAKE these medications   albuterol 108 (90 Base) MCG/ACT inhaler Commonly known as:  PROVENTIL HFA Inhale 2 puffs into the lungs every 6 (six) hours as needed for wheezing or shortness of breath.   aspirin EC 81 MG tablet Take 81 mg by mouth daily.   atorvastatin 40 MG  tablet Commonly known as:  LIPITOR Take 40 mg by mouth daily.   B-D ULTRAFINE III SHORT PEN 31G X 8 MM Misc Generic drug:  Insulin Pen Needle use as directed   DULoxetine 60 MG capsule Commonly known as:  CYMBALTA Take 2 capsules (120 mg total) by mouth daily.   furosemide 80 MG tablet Commonly  known as:  LASIX Take 1 tablet (80 mg total) by mouth daily.   gabapentin 300 MG capsule Commonly known as:  NEURONTIN take 1 capsule by mouth three times a day What changed:  See the new instructions.   metoprolol 50 MG tablet Commonly known as:  LOPRESSOR Take 50 mg by mouth 2 (two) times daily.   multivitamin Tabs tablet Take 1 tablet by mouth daily.   NOVOLOG FLEXPEN 100 UNIT/ML FlexPen Generic drug:  insulin aspart Inject 40 Units into the skin 3 (three) times daily with meals.   ONE TOUCH ULTRA TEST test strip Generic drug:  glucose blood TEST BLOOD SUGAR four times a day   pantoprazole 40 MG tablet Commonly known as:  PROTONIX take 1 tablet by mouth once daily   predniSONE 10 MG tablet Commonly known as:  DELTASONE Take 1 tablet (10 mg total) by mouth daily with breakfast. What changed:  when to take this   SENSIPAR 30 MG tablet Generic drug:  cinacalcet Take 30 mg by mouth at bedtime. Reported on 04/29/2016   sevelamer carbonate 800 MG tablet Commonly known as:  RENVELA Take 800 mg by mouth 3 (three) times daily. And 2 tablet with snacks   TRESIBA FLEXTOUCH 100 UNIT/ML Sopn FlexTouch Pen Generic drug:  insulin degludec inject 40 units subcutaneously once daily   valsartan 80 MG tablet Commonly known as:  DIOVAN Take 80 mg by mouth at bedtime.   verapamil 120 MG CR tablet Commonly known as:  CALAN-SR Take 1 tablet (120 mg total) by mouth at bedtime.        DISCHARGE INSTRUCTIONS:  See AVS.  If you experience worsening of your admission symptoms, develop shortness of breath, life threatening emergency, suicidal or homicidal thoughts you must seek medical attention immediately by calling 911 or calling your MD immediately  if symptoms less severe.  You Must read complete instructions/literature along with all the possible adverse reactions/side effects for all the Medicines you take and that have been prescribed to you. Take any new Medicines after you  have completely understood and accpet all the possible adverse reactions/side effects.   Please note  You were cared for by a hospitalist during your hospital stay. If you have any questions about your discharge medications or the care you received while you were in the hospital after you are discharged, you can call the unit and asked to speak with the hospitalist on call if the hospitalist that took care of you is not available. Once you are discharged, your primary care physician will handle any further medical issues. Please note that NO REFILLS for any discharge medications will be authorized once you are discharged, as it is imperative that you return to your primary care physician (or establish a relationship with a primary care physician if you do not have one) for your aftercare needs so that they can reassess your need for medications and monitor your lab values.    On the day of Discharge:  VITAL SIGNS:  Blood pressure (!) 150/76, pulse 74, temperature 98 F (36.7 C), temperature source Oral, resp. rate 14, height 6\' 1"  (1.854 m), weight [name](!)  316 lb 12.8 oz (143.7 kg), SpO2 100 %. PHYSICAL EXAMINATION:  GENERAL:  55 y.o.-year-old patient lying in the bed with no acute distress. Morbidly obese. EYES: Pupils equal, round, reactive to light and accommodation. No scleral icterus. Extraocular muscles intact.  HEENT: Head atraumatic, normocephalic. Oropharynx and nasopharynx clear.  NECK:  Supple, no jugular venous distention. No thyroid enlargement, no tenderness.  LUNGS: Normal breath sounds bilaterally, no wheezing, rales,rhonchi or crepitation. No use of accessory muscles of respiration.  CARDIOVASCULAR: S1, S2 normal. No murmurs, rubs, or gallops.  ABDOMEN: Soft, non-tender, non-distended. Bowel sounds present. No organomegaly or mass.  EXTREMITIES: leg trace edema, no cyanosis, or clubbing.  NEUROLOGIC: Cranial nerves II through XII are intact. Muscle strength 5/5 in all extremities.  Sensation intact. Gait not checked.  PSYCHIATRIC: The patient is alert and oriented x 3.  SKIN: No obvious rash, lesion, or ulcer.  DATA REVIEW:   CBC  Recent Labs Lab 09/08/16 0348  WBC 12.6*  HGB 11.0*  HCT 34.9*  PLT 357    Chemistries   Recent Labs Lab 09/10/16 0625  NA 136  K 4.7  CL 94*  CO2 28  GLUCOSE 246*  BUN 73*  CREATININE 9.11*  CALCIUM 6.9*     Microbiology Results  Results for orders placed or performed during the hospital encounter of 09/07/16  Body fluid culture     Status: None (Preliminary result)   Collection Time: 09/09/16 10:00 AM  Result Value Ref Range Status   Specimen Description PLEURAL  Final   Special Requests NONE  Final   Gram Stain   Final    FEW WBC PRESENT, PREDOMINANTLY MONONUCLEAR NO ORGANISMS SEEN    Culture   Final    NO GROWTH 2 DAYS Performed at [facility]    Report Status PENDING  Incomplete    RADIOLOGY:  Dg Hip Unilat With Pelvis 2-3 Views Left  Result Date: 09/10/2016 CLINICAL DATA:  Recent fall with left hip pain EXAM: DG HIP (WITH OR WITHOUT PELVIS) 2-3V LEFT COMPARISON:  None. FINDINGS: Pelvic ring is intact. No acute fracture or dislocation is noted. No soft tissue abnormality is seen. IMPRESSION: No acute abnormality noted. Electronically Signed   By: Alcide Clever M.D.   On: 09/10/2016 19:45   Dg Hip Unilat With Pelvis 2-3 Views Right  Result Date: 09/10/2016 CLINICAL DATA:  Recent fall with bilateral groin pain, initial encounter EXAM: DG HIP (WITH OR WITHOUT PELVIS) 2-3V RIGHT COMPARISON:  None. FINDINGS: No acute fracture or dislocation is noted. No gross soft tissue abnormality is seen. The pelvic ring is intact. No soft tissue changes are noted. IMPRESSION: No acute abnormality seen. Electronically Signed   By: Alcide Clever M.D.   On: 09/10/2016 19:45     Management plans discussed with the patient, his wife and they are in agreement.  CODE STATUS:     Code Status Orders        Start      Ordered   09/07/16 2144  Full code  Continuous     09/07/16 2143    Code Status History    Date Active Date Inactive Code Status Order ID Comments User Context   08/29/2016  3:09 AM 08/30/2016  1:13 PM Full Code [id number]  Arnaldo Natal, MD Inpatient   03/18/2015  8:11 PM 03/21/2015  6:23 PM Full Code [id number]  Adrian Saran, MD Inpatient      TOTAL TIME TAKING CARE OF THIS PATIENT: 38 minutes.  Shaune Pollackhen, Kashara Blocher M.D on 09/11/2016 at 3:15 PM  Between 7am to 6pm - Pager - [phone]  After 6pm go to [email or web address] - Social research officer, [email or web address] [email or web address]  [facility]  Office  [phone]  CC: Primary care physician; Tommie SamsJayce G Cook, DO   Note: This dictation was prepared with Dragon dictation along with smaller phrase technology. Any transcriptional errors that result from this process are unintentional.

## 2016-09-11 NOTE — Discharge Instructions (Signed)
Renal and ADA diet. Home O2 [place] 1-2 L .

## 2016-09-11 NOTE — Telephone Encounter (Signed)
[facility]. He was admitted for acute raspatory failure  Pt was scheduled for a HFU on 12/06 . Pt contact  [phone]

## 2016-09-11 NOTE — Telephone Encounter (Signed)
Transition Care Management Follow-up Telephone Call  How have you been since you were released from the hospital? Left  A few hours ago, he feels good.     Do you understand why you were in the hospital? Yes, respiratory failure. No concerns at this point.    Do you understand the discharge instrcutions?  Yes, no questions at this time.   Items Reviewed:  Medications reviewed: gave new medication, to take with gabapentin to help with the sharp pains he was having, he is going to the pharmacy today to get that filled.    Allergies reviewed: yes, no changes  Dietary changes reviewed: renal diet restrictions  Referrals reviewed: Appt with Dr. Dema SeverinMungal and to follow up with us next week.    Functional Questionnaire:   Activities of Daily Living (ADLs):   He states they are independent in the following: he fell Sunday trying to get to the door to let the EMS personal in, and did a split.  They did a xray, nothing torn or broken but hard to get around. Has assistance from his wife right now, but she is going back to work tomorrow, but he is getting by he stated.  States they require assistance with the following: no    Any transportation issues/concerns?: no concerns   Any patient concerns? No concerns at this time.   Confirmed importance and date/time of follow-up visits scheduled: 12/6 appt with Dr. Adriana Simasook at 1130 am   Confirmed with patient if condition begins to worsen call PCP or go to the ER.  Patient was given the Call-a-Nurse line [phone]: yes, verbalized understanding.

## 2016-09-11 NOTE — Progress Notes (Signed)
Pt discharged to home via wc.  Instructions and rx given to pt.  Questions answered.  No distress.  

## 2016-09-11 NOTE — Care Management (Addendum)
No discharge needs.  Notified Susann Givensheryl Brawner with [facility] of discharge.  Will forward discharge summary when available.  Patient does not have a portable 02 tank for transport home.  Says that his "tanks at home are not filling right."  He contacted [facility] yesterday but was told he would have to be at home before the concentrator could be assessed.  Barbara CowerJason will take portable tank to patient's room.  Patient has been wearing his oxygen on a when I need it basis.  Discussed the need to wear it continuous until told otherwise by his physician.

## 2016-09-12 LAB — BODY FLUID CULTURE: Culture: NO GROWTH

## 2016-09-15 NOTE — Care Management (Signed)
Sent discharge information for a second time to [facility]

## 2016-09-16 ENCOUNTER — Ambulatory Visit (INDEPENDENT_AMBULATORY_CARE_PROVIDER_SITE_OTHER): Payer: Medicare Other | Admitting: Internal Medicine

## 2016-09-16 ENCOUNTER — Encounter: Payer: Self-pay | Admitting: Internal Medicine

## 2016-09-16 VITALS — BP 138/72 | HR 74 | Ht 73.0 in | Wt 318.0 lb

## 2016-09-16 DIAGNOSIS — R0602 Shortness of breath: Secondary | ICD-10-CM

## 2016-09-16 DIAGNOSIS — J309 Allergic rhinitis, unspecified: Secondary | ICD-10-CM | POA: Diagnosis not present

## 2016-09-16 DIAGNOSIS — J8489 Other specified interstitial pulmonary diseases: Secondary | ICD-10-CM

## 2016-09-16 DIAGNOSIS — I5032 Chronic diastolic (congestive) heart failure: Secondary | ICD-10-CM | POA: Diagnosis not present

## 2016-09-16 DIAGNOSIS — J9811 Atelectasis: Secondary | ICD-10-CM

## 2016-09-16 NOTE — Progress Notes (Signed)
MRN# [id number] Ryan Arnold 09/30/1961   CC: Chief Complaint  Patient presents with  . Hospitalization Follow-up    COPD: wearing O2 @ 2L 24/7: Prednisone 10mg  daily: unsteady gait w/multiple falls recently   Synopsis: 55 year old male past medical history of end-stage renal disease on dialysis, uncontrolled diabetes, hypertension, former retired Emergency planning/management officerpolice officer, seen in consultation for recurrent pneumonias. Multiple hospitalizations at [facility] and at [facility] over the past one year for bilateral groundglass opacities throughout entire lung fields requiring multiple rounds of antibiotics and steroids. [facility] and within the last 2 years no acute findings from the BAL. Patient with recent bronchoscopy, March 2016, at [facility] by Dr. Dema SeverinMungal, BAL with no acute findings. April 2016 surgical lung bx (wedge) by Dr. [name], path confirms BOOP, started on high dose steroids.   Events since last clinic visit: Patient presents today for a BOOP follow up. Recently had two admission for CHF exacerbation. Noted to have a small right pleural effusion, which was tapped, mostly transudative, with mild bloody tap.  Currently on 10 mg prednisone and on 2L of O2 continuous. Has had some accidental falls over the past couple of weeks. Has address with his PMD and awaiting further recs.  No LOC, no dizziness. Has neuro follow up with Dr. Jodi Mourningeynold in 3-4 weeks. He was treated for a suspected HCAP last week, but review of images by me, are more consistent with fluid and atelectasis. No fever, no significant rise in WBC during this time either.      Medication:    Current Outpatient Prescriptions:  .  albuterol (PROVENTIL HFA) 108 (90 Base) MCG/ACT inhaler, Inhale 2 puffs into the lungs every 6 (six) hours as needed for wheezing or shortness of breath., Disp: 1 Inhaler, Rfl: 0 .  aspirin EC 81 MG tablet, Take 81 mg by mouth daily., Disp: , Rfl:  .  atorvastatin (LIPITOR) 40 MG tablet, Take 40 mg by  mouth daily., Disp: , Rfl:  .  B-D ULTRAFINE III SHORT PEN 31G X 8 MM MISC, use as directed, Disp: 100 each, Rfl: 11 .  DULoxetine (CYMBALTA) 60 MG capsule, Take 2 capsules (120 mg total) by mouth daily., Disp: 90 capsule, Rfl: 2 .  furosemide (LASIX) 80 MG tablet, Take 1 tablet (80 mg total) by mouth daily., Disp: 30 tablet, Rfl: 2 .  gabapentin (NEURONTIN) 300 MG capsule, take 1 capsule by mouth three times a day, Disp: 90 capsule, Rfl: 1 .  insulin aspart (NOVOLOG FLEXPEN) 100 UNIT/ML FlexPen, Inject 40 Units into the skin 3 (three) times daily with meals., Disp: , Rfl:  .  metoprolol (LOPRESSOR) 50 MG tablet, Take 50 mg by mouth 2 (two) times daily., Disp: , Rfl:  .  multivitamin (RENA-VIT) TABS tablet, Take 1 tablet by mouth daily., Disp: , Rfl:  .  ONE TOUCH ULTRA TEST test strip, TEST BLOOD SUGAR four times a day, Disp: 100 each, Rfl: 3 .  pantoprazole (PROTONIX) 40 MG tablet, take 1 tablet by mouth once daily, Disp: 90 tablet, Rfl: 3 .  predniSONE (DELTASONE) 10 MG tablet, Take 1 tablet (10 mg total) by mouth daily with breakfast. (Patient taking differently: Take 10 mg by mouth 2 (two) times daily with a meal. ), Disp: 30 tablet, Rfl: 0 .  SENSIPAR 30 MG tablet, Take 30 mg by mouth at bedtime. Reported on 04/29/2016, Disp: , Rfl:  .  sevelamer carbonate (RENVELA) 800 MG tablet, Take 800 mg by mouth 3 (three) times daily.  And 2 tablet with snacks, Disp: , Rfl:  .  TRESIBA FLEXTOUCH 100 UNIT/ML SOPN FlexTouch Pen, inject 40 units subcutaneously once daily, Disp: 15 mL, Rfl: 2 .  valsartan (DIOVAN) 80 MG tablet, Take 80 mg by mouth at bedtime. , Disp: , Rfl: 0 .  verapamil (CALAN-SR) 120 MG CR tablet, Take 1 tablet (120 mg total) by mouth at bedtime., Disp: 30 tablet, Rfl: 1     Review of Systems: Gen:  Denies  fever, sweats, chills HEENT: Denies blurred vision, double vision, ear pain, eye pain, hearing loss, nose bleeds, sore throat Cvc:  No dizziness, chest pain or heaviness Resp:    Admits ZO:[id number]:chronic dyspnea (worst today) Gi: diarrhea (watery, nonbloody x 2 days) now resolved Gu:  Denies bladder incontinence, burning urine Ext:   No Joint pain, stiffness or swelling Skin: No skin rash, easy bruising or bleeding or hives Endoc:  No polyuria, polydipsia , polyphagia or weight change Other:  All other systems negative  Allergies:  Oxycodone-acetaminophen and Tape  Physical Examination:  VS: There were no vitals taken for this visit.  General Appearance: No distress  HEENT: PERRLA, no ptosis, no other lesions noticed Pulmonary: Good respiratory effort, mild decreased breath sounds at the bases(this is baseline), no wheezes, no crackles Cardiovascular:  Normal S1,S2.  No m/r/g.     Abdomen:Exam: Benign, Soft, non-tender, No masses  Skin:   warm, no ecchymosis, fine milia appearance lesions on the face and forehead (no erythema, no eruption),with significant improvement Extremities: normal, no cyanosis, clubbing, warm with normal capillary refill.      Rad results: (The following images and results were reviewed by Dr. Dema SeverinMungal on 09/16/2016). CTA Chest 09/08/16 FINDINGS: Cardiovascular: No pulmonary embolus is noted. Heart size within normal limits. Trace anterior pericardial effusion. There is no aortic aneurysm.  Mediastinum/Nodes: No mediastinal hematoma or adenopathy. Small air is noted within esophagus.  Lungs/Pleura: There is partially loculated small right pleural effusion. Some atelectasis or scarring is noted in right lower lobe posteriorly. No segmental infiltrate or pulmonary edema. No fibrotic changes are noted. No bronchiectasis.  Upper Abdomen: The visualized upper abdomen shows status postcholecystectomy. No adrenal gland mass is noted. Visualized pancreas and spleen is unremarkable.  Musculoskeletal: No destructive bony lesions are noted. Sagittal images of the spine shows degenerative changes thoracic spine.  Review of the MIP images  confirms the above findings.  IMPRESSION: 1. No pulmonary embolus is noted. 2. There is small partially loculated right pleural effusion. Atelectasis or scarring is noted in right lower lobe posteriorly. 3. No mediastinal hematoma or adenopathy. 4. Status postcholecystectomy. 5. Degenerative changes thoracic spine.  Assessment and Plan:55 yo male seen in follow up for BOOP. [facility],treated for suspected HCAP, now back on prednisone daily and 2L o2 continuously.    Atelectasis I have reviewed patient's recent CTA chest. There is mostly right lower lobe atelectasis with surrounding pleural effusion. Do not see any significant infiltrate to suggest that he had a pneumonia. In addition he had no significant increase in white cell count fever, chills. He was subsequently treated for a suspected age. At his steroids were continued to 10 mg daily. At this time I believe that his dyspnea is still multifactorial deconditioning, heart failure, end-stage renal disease. His BOOP has been adequately treated he does not have any significant groundglass opacities or abnormal findings on his CTA chest to suggest is any reactivation of interstitial lung disease.  At this time believe that his dyspnea is  related to heart failure and possibly further cardiac disease. Further cardiac workup may be warranted with stress testing of some type or cardiac catheterization.  Plan: -Deep breathing exercises -Follow-up with cardiology for further cardiac workup of dyspnea also  BOOP (bronchiolitis obliterans with organizing pneumonia) Stable Review of CTA chest last week does not show any interstitial markings or groundglass opacities to suggest reactivation of disease. Patient previously on 10 mg prednisone 3 times per week on nondialysis days. Since recent admission and discharge, he is resumed prednisone 10 mg daily along with 2 L of submental oxygen  continuously.  Plan: -Continue with prednisone 10 mg daily and 2 L of supplemental oxygen -Reassess need to wean prednisone at follow-up visit  Chronic diastolic CHF (congestive heart failure) With known chronic diastolic heart failure. Recent echocardiogram shows ejection fraction 50-55 percent -At this time he is followed closely by cardiology. Continue with good blood pressure control. Continue with dialysis. I suggested that he follow-up with urology given the level of dyspnea in the setting of stable pulmonary status and optimization of interstitial lung disease treatment along with OSA and optimize and stage renal disease. I believe that his dyspnea may be related to congestive heart failure along with arterial disease. Pending cardiology input and workup he may need further stress testing or cardiac catheterization, given the level of dyspnea he's having with optimize pulmonary status.  Plan: -Continue blood pressure management as dictated by primary care, cardiology, nephrology -Follow-up with cardiology in the next 2-3 weeks line-continue with dialysis -Continue with fluid restriction and salt restriction  SOB (shortness of breath) Multifactorial: Deconditioning, diastolic heart failure, end-stage renal disease, previous interstitial lung disease,  At this time believe that is significant dyspnea is related to the above causes in addition to mostly cardiac issues that he may be having. He did have a small pleural effusion that had a thoracentesis on the right, which was mostly transudate of. He does have a history of heart failure with a preserved ejection fraction of 50-55 percent. Given his level of dyspnea, in the setting of optimize pulmonary status and no significant interstitial lung disease on recent CAT scan of chest, his dyspnea is mostly related to underlying cardiac causes that can be further worked up.  Plan: -Continue prednisone 10 mg daily Continuous of omental  oxygen 2 L continuously Follow-up with cardiology for further workup of diastolic heart failure and possibly arterial disease.

## 2016-09-16 NOTE — Patient Instructions (Addendum)
Follow up with Dr. Sung AmabileSimonds in 2-3 weeks - cont with Prednisone 10mg  daily  - cont with 2L o2 continuously - follow up with Cardiology, Dr. Kirke CorinArida, for heart failure and dyspnea on exertion. We will try to get you in to see in in the next 2-3 weeks.  - keep good blood pressure control - continue with HD schedule - ask your Kidney doctors about taking NSAIDS (ibuprofen, aleve, etc)

## 2016-09-16 NOTE — Assessment & Plan Note (Signed)
Stable Review of CTA chest last week does not show any interstitial markings or groundglass opacities to suggest reactivation of disease. Patient previously on 10 mg prednisone 3 times per week on nondialysis days. Since recent admission and discharge, he is resumed prednisone 10 mg daily along with 2 L of submental oxygen continuously.  Plan: -Continue with prednisone 10 mg daily and 2 L of supplemental oxygen -Reassess need to wean prednisone at follow-up visit

## 2016-09-16 NOTE — Assessment & Plan Note (Signed)
Multifactorial: Deconditioning, diastolic heart failure, end-stage renal disease, previous interstitial lung disease,  At this time believe that is significant dyspnea is related to the above causes in addition to mostly cardiac issues that he may be having. He did have a small pleural effusion that had a thoracentesis on the right, which was mostly transudate of. He does have a history of heart failure with a preserved ejection fraction of 50-55 percent. Given his level of dyspnea, in the setting of optimize pulmonary status and no significant interstitial lung disease on recent CAT scan of chest, his dyspnea is mostly related to underlying cardiac causes that can be further worked up.  Plan: -Continue prednisone 10 mg daily Continuous of omental oxygen 2 L continuously Follow-up with cardiology for further workup of diastolic heart failure and possibly arterial disease.

## 2016-09-16 NOTE — Assessment & Plan Note (Signed)
I have reviewed patient's recent CTA chest. There is mostly right lower lobe atelectasis with surrounding pleural effusion. Do not see any significant infiltrate to suggest that he had a pneumonia. In addition he had no significant increase in white cell count fever, chills. He was subsequently treated for a suspected age. At his steroids were continued to 10 mg daily. At this time I believe that his dyspnea is still multifactorial deconditioning, heart failure, end-stage renal disease. His BOOP has been adequately treated he does not have any significant groundglass opacities or abnormal findings on his CTA chest to suggest is any reactivation of interstitial lung disease.  At this time believe that his dyspnea is related to heart failure and possibly further cardiac disease. Further cardiac workup may be warranted with stress testing of some type or cardiac catheterization.  Plan: -Deep breathing exercises -Follow-up with cardiology for further cardiac workup of dyspnea also

## 2016-09-16 NOTE — Assessment & Plan Note (Signed)
With known chronic diastolic heart failure. Recent echocardiogram shows ejection fraction 50-55 percent -At this time he is followed closely by cardiology. Continue with good blood pressure control. Continue with dialysis. I suggested that he follow-up with urology given the level of dyspnea in the setting of stable pulmonary status and optimization of interstitial lung disease treatment along with OSA and optimize and stage renal disease. I believe that his dyspnea may be related to congestive heart failure along with arterial disease. Pending cardiology input and workup he may need further stress testing or cardiac catheterization, given the level of dyspnea he's having with optimize pulmonary status.  Plan: -Continue blood pressure management as dictated by primary care, cardiology, nephrology -Follow-up with cardiology in the next 2-3 weeks line-continue with dialysis -Continue with fluid restriction and salt restriction

## 2016-09-17 ENCOUNTER — Telehealth: Payer: Self-pay | Admitting: Family Medicine

## 2016-09-17 ENCOUNTER — Ambulatory Visit: Payer: BC Managed Care – PPO | Admitting: Family Medicine

## 2016-09-17 NOTE — Telephone Encounter (Addendum)
Called pt on behalf of PCP in regards to appointment. Please forward call if he calls back.

## 2016-09-18 ENCOUNTER — Telehealth: Payer: Self-pay | Admitting: Internal Medicine

## 2016-09-18 NOTE — Telephone Encounter (Signed)
Pt needs to discuss his test for oxygen machine. Please call.

## 2016-09-18 NOTE — Telephone Encounter (Signed)
Spoke with pt, who was seen on 09/16/16. VM placed order to eval pt for POC. Pt states he did not qualify for the POC, but doesn't understand how [facility] can tell him he doesn't qualify when the doctor has ordered for a current liter flow. I explained to pt that protocol is to maintain sats 90% or above in order to qualify for POC. I also explained the difference between pulse and continuous flow. Pt seemed upset and stated that he needs a POC that he can charge while in the car. Pt states that he can't keep taking his current tank out and having to rush to get home because he is scared that it is going to run out.  I called and [facility]eredith [facility] Meredith read off to me the RT notes, pt passed the sating test at 2L cont,  RT then placed pt on 2L pulse with exertion, after a few yards of exertion pt sats started to decline, RT turned tank up to 6L pulse and pt was sating at 73% by the time they got back to the chair. Sharyl NimrodMeredith states that we could eval pt again with another RT.  VM please advise. Thanks.

## 2016-09-24 ENCOUNTER — Other Ambulatory Visit: Payer: Self-pay | Admitting: Endocrinology

## 2016-09-30 NOTE — Telephone Encounter (Signed)
Pt scheduled to f/u with DS on 12-21. Will have DS address this with pt at f/u Nothing further needed.

## 2016-10-02 ENCOUNTER — Encounter: Payer: Self-pay | Admitting: Pulmonary Disease

## 2016-10-02 ENCOUNTER — Ambulatory Visit (INDEPENDENT_AMBULATORY_CARE_PROVIDER_SITE_OTHER): Payer: Medicare Other | Admitting: Pulmonary Disease

## 2016-10-02 VITALS — BP 140/82 | HR 77 | Ht 73.0 in | Wt 316.8 lb

## 2016-10-02 DIAGNOSIS — E8779 Other fluid overload: Secondary | ICD-10-CM | POA: Diagnosis not present

## 2016-10-02 DIAGNOSIS — J9611 Chronic respiratory failure with hypoxia: Secondary | ICD-10-CM | POA: Diagnosis not present

## 2016-10-02 DIAGNOSIS — J8489 Other specified interstitial pulmonary diseases: Secondary | ICD-10-CM

## 2016-10-02 DIAGNOSIS — R0609 Other forms of dyspnea: Secondary | ICD-10-CM

## 2016-10-02 MED ORDER — PREDNISONE 5 MG PO TABS: ORAL_TABLET | ORAL | 10 refills | Status: AC

## 2016-10-02 MED ORDER — FUROSEMIDE 80 MG PO TABS: 80.0000 mg | ORAL_TABLET | Freq: Every day | ORAL | 10 refills | Status: DC

## 2016-10-02 NOTE — Patient Instructions (Addendum)
1) I have rewritten the prescription for prednisone. I have ordered 5 mg tablets. I want you to take 10 mg (2 tabs) alternating with 5 mg each day 2) Continue oxygen 2 lpm pulse at rest. You may turn the oxygen up to 4 lpm pulse with exertion or 2-3 lpm continuous flow with exertion  Follow up in 6 weeks

## 2016-10-05 IMAGING — CR DG CHEST 2V
1 series · 2 of 2 positions shown · non-contrast
Comparison: 02/02/2015.

CLINICAL DATA: Interstitial lung disease. Right-sided lung biopsy
on [REDACTED]. Shortness of breath.

EXAM:
CHEST  2 VIEW

[Series 1: dxr chest pa (or ap) and lateral · 0.14mm/px · 2 of 2 slices shown]
[im 1/2]
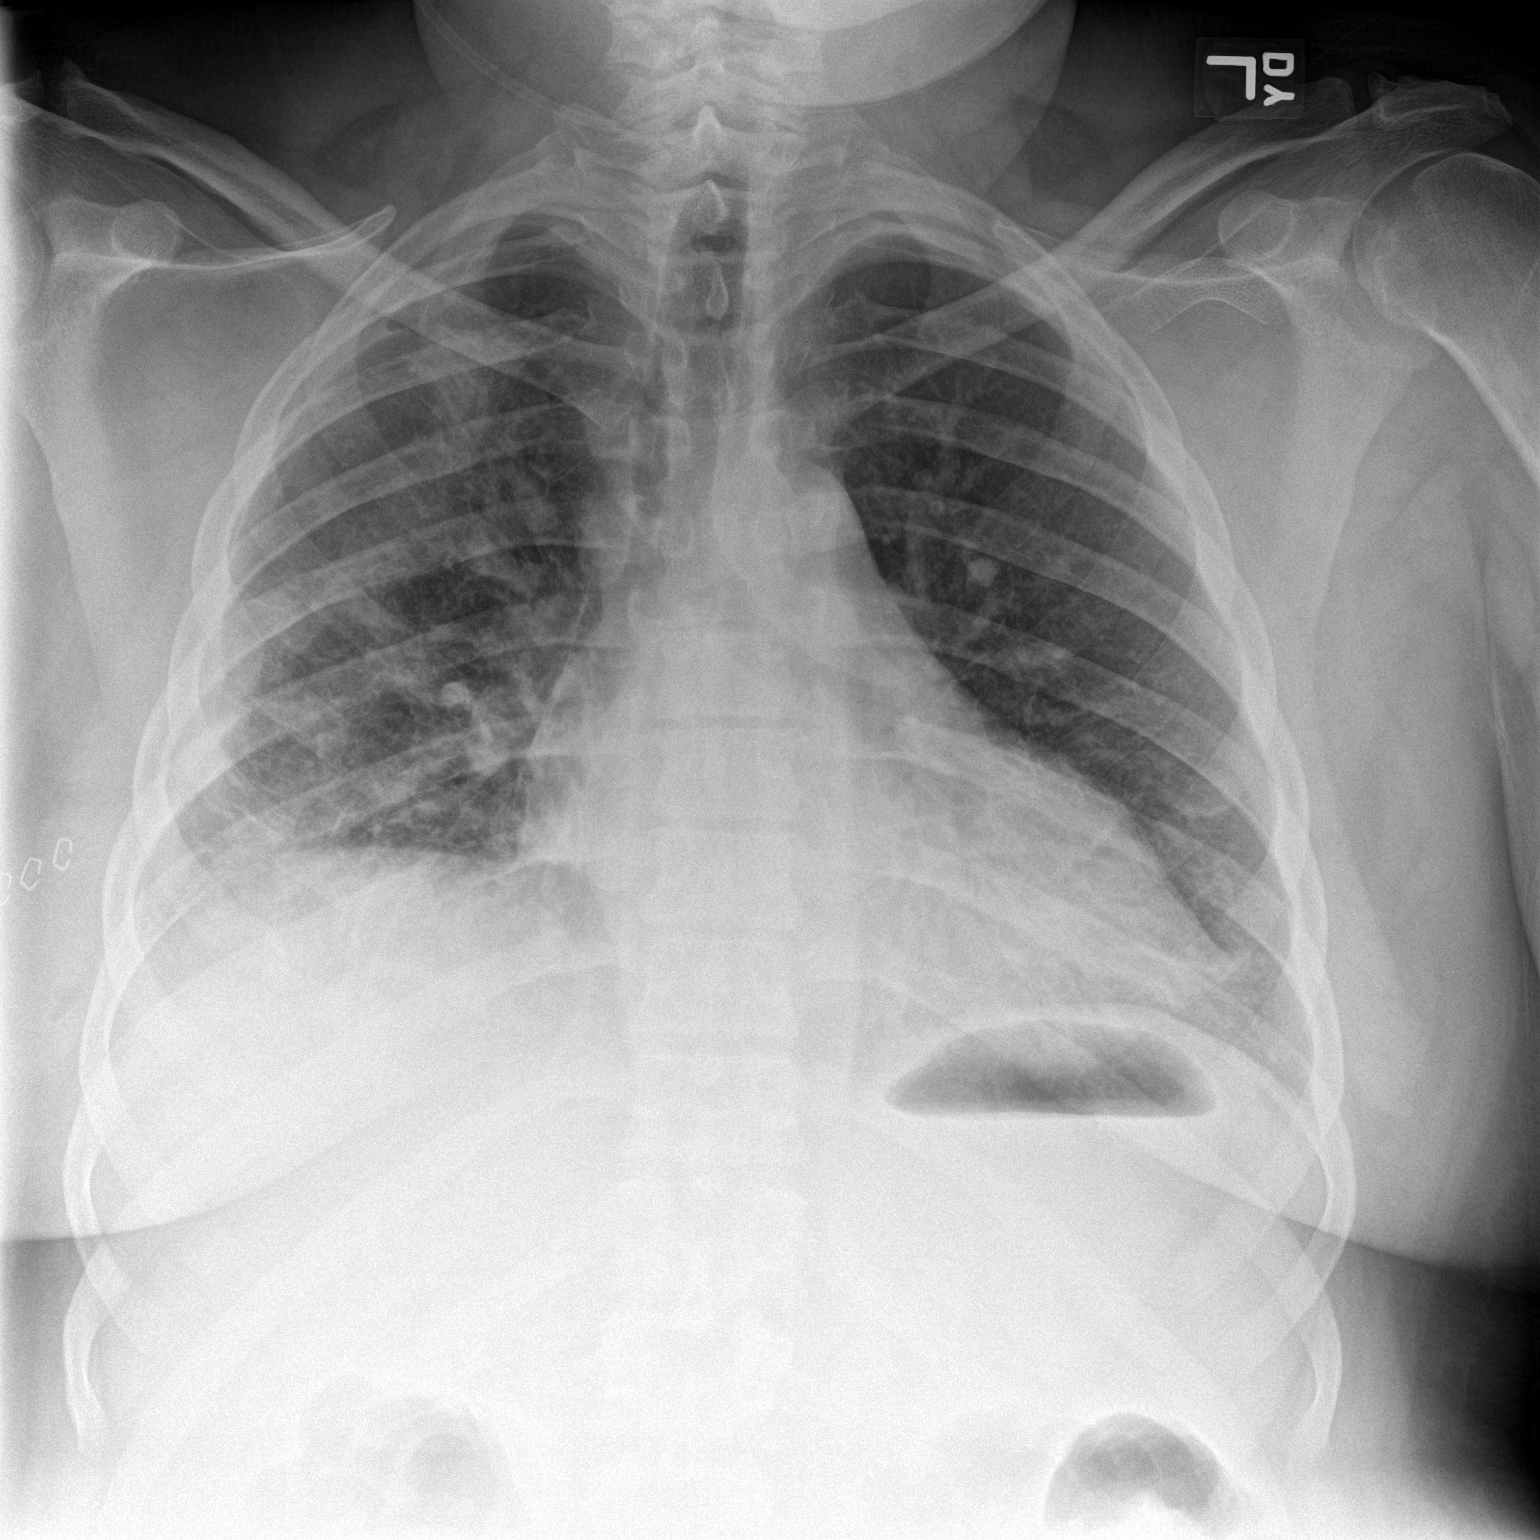
[im 2/2]
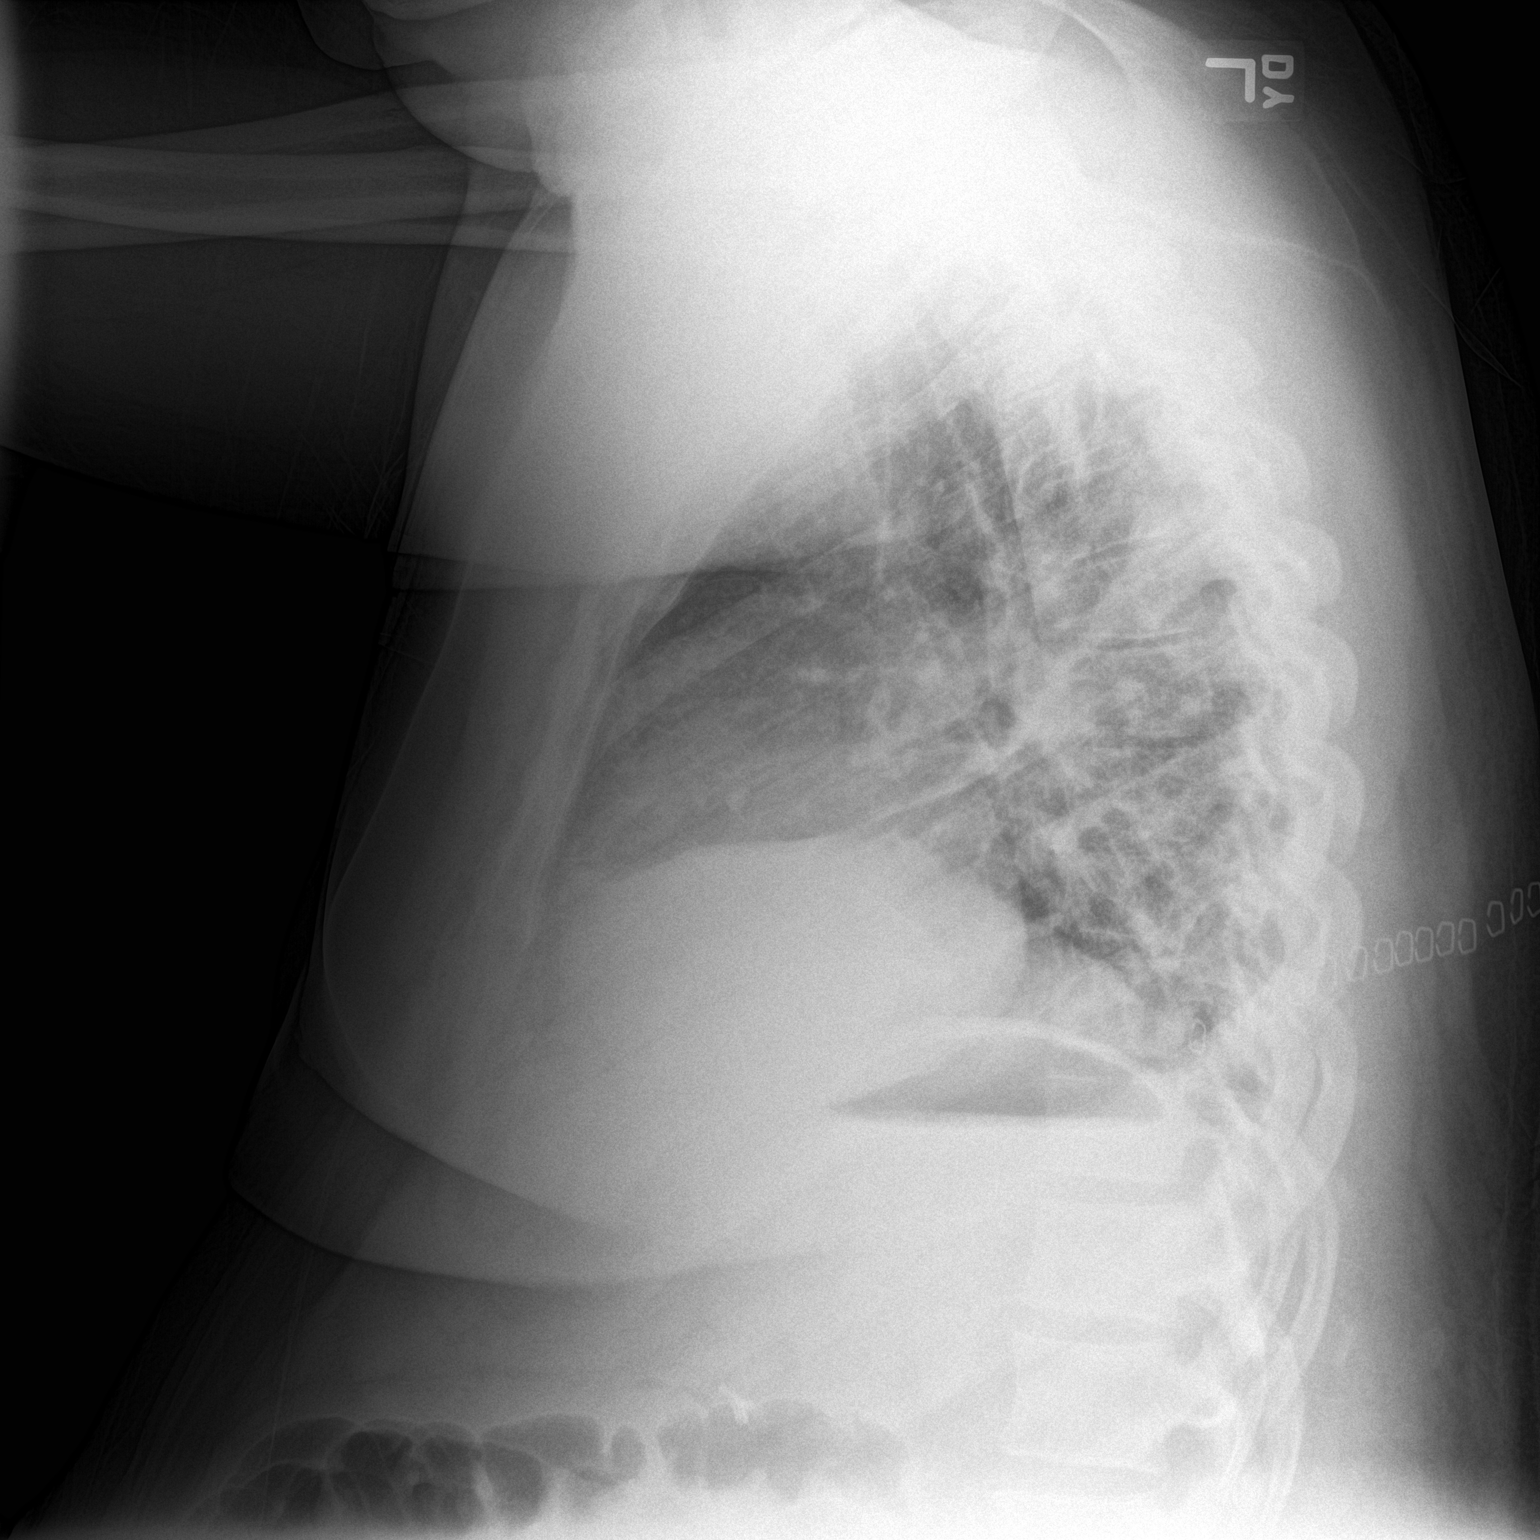

[2 of 2 positions shown; findings below may reference images not displayed]

FINDINGS: Pleural thickening in reaction is present along the course of the
RIGHT thoracostomy tube from the prior exam. Tiny component of
pneumothorax remains present at the lateral RIGHT apex. No apical
pleural line is identified aside from this region. RIGHT basilar
atelectasis. Skin staples are present along the RIGHT chest wall.
Volume loss in the RIGHT lung. The cardiopericardial silhouette is
enlarged but appears similar to the prior exam. LEFT basilar
atelectasis is present, most pronounced in the retrocardiac region.
IMPRESSION: 1. Interval removal of RIGHT thoracostomy tube. Tiny residual RIGHT
lateral apical pneumothorax which is probably loculated.
2. Pleural reaction along the course of the chest tube.
3. Bilateral right-greater-than-left basilar atelectasis.

## 2016-10-07 NOTE — Progress Notes (Signed)
PULMONARY OFFICE FOLLOW UP NOTE  PROBLEMS:  1) Chronic hypoxemic respiratory failure on LTOT 2) Relapsing BOOP 3) Morbid obesity 4) Group 3 PAH (mild) 5) ESRD 6) OSA, chronic BiPAP ([facility])  DATA: CT chest 12/13/14: Patchy airspace consolidation and ground-glass involving all lobes of both lungs, likely due to pneumonia. Pulmonary hemorrhage (as can be seen with lupus) is another consideration. Congestive heart failure is considered less likely CT chest 01/08/15: Patchy areas of ground-glass opacity in both lungs, predominating in the upper lobes, right greater than left. Associated minimal left pleural effusion Lung biopsy 01/30/15: BOOP Spirometry 07/29/16: no obstruction, probable moderate restriction CT chest 09/08/16: There is small partially loculated right pleural effusion. Atelectasis or scarring is noted in right lower lobe posteriorly. Echocardiogram 09/08/16: LVEF normal, mild to mod LVH, mildly dilated LA, mild PAH   INTERVAL HISTORY: Previously followed by Dr Dema SeverinMungal and last seen 09/16/16. This visit was arranged for transition of care to my service.  SUBJ:  No new complaints. No distress. Denies CP, fever, cough, purulent sputum and hemoptysis. He has chronic LE edema not changed from his baseline. He is on prednisone 10 mg daily  OBJ: Vitals:   10/02/16 0905  BP: 140/82  Pulse: 77  SpO2: 100%  Weight: (!) 316 lb 12.8 oz (143.7 kg)  Height: 6\' 1"  (1.854 m)  O2 2 LPM [place]  97% on RA @ rest  Gen: Obese, NAD HEENT: cushingoid facies Neck: NO LAN, JVP cannot be assess Lungs: decreased BS, no adventitious sounds Cardiovascular: Reg rate, normal rhythm, no M noted Abdomen: Soft, NT +BS Ext: severe BLE edema with chronic stasis changes Neuro: CNs intact, motor/sens grossly intact Skin: No lesions noted   DATA: No new CXR or PFTs  Current Outpatient Prescriptions on File Prior to Visit  Medication Sig Dispense Refill  . albuterol (PROVENTIL HFA) 108  (90 Base) MCG/ACT inhaler Inhale 2 puffs into the lungs every 6 (six) hours as needed for wheezing or shortness of breath. 1 Inhaler 0  . aspirin EC 81 MG tablet Take 81 mg by mouth daily.    [name]. atorvastatin (LIPITOR) 40 MG tablet Take 40 mg by mouth daily.    . B-D ULTRAFINE III SHORT PEN 31G X 8 MM MISC use as directed 100 each 11  . DULoxetine (CYMBALTA) 60 MG capsule Take 2 capsules (120 mg total) by mouth daily. 90 capsule 2  . gabapentin (NEURONTIN) 300 MG capsule take 1 capsule by mouth three times a day 90 capsule 1  . metoprolol (LOPRESSOR) 50 MG tablet Take 50 mg by mouth 2 (two) times daily.    . multivitamin (RENA-VIT) TABS tablet Take 1 tablet by mouth daily.    [name]. NOVOLOG FLEXPEN 100 UNIT/ML FlexPen inject 35 units subcutaneously three times a day with food 30 mL 1  . ONE TOUCH ULTRA TEST test strip TEST BLOOD SUGAR four times a day 100 each 3  . pantoprazole (PROTONIX) 40 MG tablet take 1 tablet by mouth once daily 90 tablet 3  . SENSIPAR 30 MG tablet Take 30 mg by mouth at bedtime. Reported on 04/29/2016    . sevelamer carbonate (RENVELA) 800 MG tablet Take 800 mg by mouth 3 (three) times daily. And 2 tablet with snacks    . TRESIBA FLEXTOUCH 100 UNIT/ML SOPN FlexTouch Pen inject 40 units subcutaneously once daily 15 mL 2  . valsartan (DIOVAN) 80 MG tablet Take 80 mg by mouth at bedtime.   0  . verapamil (CALAN-SR)  120 MG CR tablet Take 1 tablet (120 mg total) by mouth at bedtime. 30 tablet 1   No current facility-administered medications on file prior to visit.     IMPRESSION: 1) Chronic hypoxemic respiratory failure on LTOT 2) Relapsing BOOP 3) Morbid obesity 4) Group 3 PAH (mild) 5) ESRD 6) OSA, chronic BiPAP (Followed @ [facility])  His major respiratory problem is restrictive physiology due to sever obesity. It appears that the BOOP is in remission on prednisone 10 mg daily but he is suffering severe consequences of chronic prednisone therapy with Cushingoid changes and  obesity  PLAN: We will try to taper prednisone slowly. I have refilled his prescription with 5 mg tabs and he is to take 10 mg alternating with 5 mg daily. Continue oxygen therapy 2 lpm @ rest and 4 lpm with exertion. Follow up in 6 weeks   Ryan Fischeravid Cerria Randhawa, MD PCCM service Mobile [phone] Pager [phone] 10/07/2016

## 2016-10-09 ENCOUNTER — Encounter: Payer: Self-pay | Admitting: Physician Assistant

## 2016-10-09 ENCOUNTER — Ambulatory Visit (INDEPENDENT_AMBULATORY_CARE_PROVIDER_SITE_OTHER): Payer: Medicare Other | Admitting: Physician Assistant

## 2016-10-09 VITALS — BP 140/72 | HR 76 | Ht 73.0 in | Wt 317.0 lb

## 2016-10-09 DIAGNOSIS — I110 Hypertensive heart disease with heart failure: Secondary | ICD-10-CM

## 2016-10-09 DIAGNOSIS — J8489 Other specified interstitial pulmonary diseases: Secondary | ICD-10-CM

## 2016-10-09 DIAGNOSIS — I5032 Chronic diastolic (congestive) heart failure: Secondary | ICD-10-CM | POA: Diagnosis not present

## 2016-10-09 DIAGNOSIS — N186 End stage renal disease: Secondary | ICD-10-CM | POA: Diagnosis not present

## 2016-10-09 DIAGNOSIS — Z992 Dependence on renal dialysis: Secondary | ICD-10-CM

## 2016-10-09 DIAGNOSIS — I493 Ventricular premature depolarization: Secondary | ICD-10-CM

## 2016-10-09 DIAGNOSIS — G4733 Obstructive sleep apnea (adult) (pediatric): Secondary | ICD-10-CM

## 2016-10-09 MED ORDER — FUROSEMIDE 40 MG PO TABS: ORAL_TABLET | ORAL | 3 refills | Status: DC

## 2016-10-09 NOTE — Patient Instructions (Addendum)
Medication Instructions:  Your physician has recommended you make the following change in your medication:  1- START taking furosemide 40 mg by mouth in the afternoon, in addition to taking furosemide 80 mg by mouth in the morning.   Follow-Up: Your physician recommends that you schedule a follow-up appointment in: 1 MONTH.   If you need a refill on your cardiac medications before your next appointment, please call your pharmacy.

## 2016-10-09 NOTE — Progress Notes (Addendum)
Cardiology Office Note Date:  10/09/2016  Patient ID:  Ryan Arnold, DOB 08/28/1961, MRN [id number] PCP:  Tommie SamsJayce G Cook, DO  Cardiologist:  Dr. Mariah MillingGollan, MD    Chief Complaint: SOB  History of Present Illness: Karsten FellsMyron W Fannin is a 55 y.o. male with history of chronic diastolic CHF/pulmonary hypertension, ESRD on HD (MWF), chronic respiratory failure on home oxygen 2/2 BOOP diagnosed by biopsy in 01/2015, DM, hypertensive heart disease, HLD, morbid obesity, OSA on BiPAP, chronic LE edema noncompliant with compression hose who presents for evaluation of  [facility] stress test at [facility] in 06/2014 showed no evidence of ischemia with normal EF. Echo in March 2016 showed normal LV systolic function with mild LVH. During prior sleep study had 4 beat run of WCT. Dry weight has been increased by nephrology in early 2017 from 135 kg to 136 kg. Holter monitor in 04/2015 showed occasional PVCs otherwise no significant arrhythmia. Has had 2 recent hospital admissions in November for volume overload and COPD exacerbation, BOOP, and hypertensive urgency. During 2nd admission echo from 09/09/2016 showed normal LV systolic function with an EF of 55-60%, mild to moderate LVH, no RWMA, mild AI, left atrium was mildly dilated, PASP was mildly elevated at 35-40 mmHg, small pericardial effusion was noted. During this admission he was noted to have a right-sided pleural effusion and underwent thoracentesis. Most recently seen by Dr. Mariah MillingGollan in 04/2016 with mention of rare atypical chest pain that was not associated with exertion and without escalation. This was unchanged from his prior clinic visits. Continued medical therapy was advised. Most recently seen by pulmonary in late December 2017 with BOOP appearing to be in remission. His prednisone was tapered. He was felt to mainly have a restrictive physiology 2/2 severe obesity.   Patient has noted increased SOB since early November, which he has attributed to his previous  decrease in prednisone. Patient reports improvement in breathing since change in prednisone most recently. He has also noted increase in fluid retention since being on prednisone with his dry weight going from previous 136 kg to 142 kg now. He reports dialysis is planning to start to pull off more fluid. No further chest pain. Still not wearing compression hose.   DATA: CT chest 12/13/14: Patchy airspace consolidation and ground-glass involving all lobes of both lungs, likely due to pneumonia. Pulmonary hemorrhage (as can be seen with lupus) is another consideration. Congestive heart failure is considered less likely CT chest 01/08/15: Patchy areas of ground-glass opacity in both lungs, predominating in the upper lobes, right greater than left. Associated minimal left pleural effusion Lung biopsy 01/30/15: BOOP Spirometry 07/29/16: no obstruction, probable moderate restriction CT chest 09/08/16: There is small partially loculated right pleural effusion. Atelectasis or scarring is noted in right lower lobe posteriorly. Echocardiogram 09/08/16: LVEF normal, mild to mod LVH, mildly dilated LA, mild PAH  Past Medical History:  Diagnosis Date  . Anemia of chronic disease   . BOOP (bronchiolitis obliterans with organizing pneumonia) (HCC)    a. 01/2018 s/p R thoracoscopy/Bx confirming BOOP;  b. 02/2018 high dose steroids started.  . Chest pain    a. 06/2014 Myoview ([facility]) no ischemia/infarct, nl EF.  [name]. Chronic diastolic CHF (congestive heart failure) (HCC)    a. 12/2014 Echo: EF 50-55%, mild conc LVH, mildly dil LA.  . End stage renal disease (HCC)    a. on dialysis; still makes urine.  . Essential hypertension   . Hyperlipemia   . Morbid obesity (HCC)   .  OSA treated with BiPAP   . Recurrent pneumonia    a. 2/2 BOOP.  [name] Renal insufficiency   . Secondary hyperparathyroidism (HCC)   . Thyroid disease   . Type 1 diabetes mellitus (HCC)     Past Surgical History:  Procedure Laterality Date  .  cataract Bilateral   . GALLBLADDER SURGERY    . KNEE SURGERY Right   . LUNG BIOPSY    . SHOULDER SURGERY Left     Current Outpatient Prescriptions  Medication Sig Dispense Refill  . albuterol (PROVENTIL HFA) 108 (90 Base) MCG/ACT inhaler Inhale 2 puffs into the lungs every 6 (six) hours as needed for wheezing or shortness of breath. 1 Inhaler 0  . aspirin EC 81 MG tablet Take 81 mg by mouth daily.    [name] atorvastatin (LIPITOR) 40 MG tablet Take 40 mg by mouth daily.    . B-D ULTRAFINE III SHORT PEN 31G X 8 MM MISC use as directed 100 each 11  . DULoxetine (CYMBALTA) 60 MG capsule Take 2 capsules (120 mg total) by mouth daily. 90 capsule 2  . furosemide (LASIX) 80 MG tablet Take 1 tablet (80 mg total) by mouth daily. 30 tablet 10  . gabapentin (NEURONTIN) 300 MG capsule take 1 capsule by mouth three times a day 90 capsule 1  . metoprolol (LOPRESSOR) 50 MG tablet Take 50 mg by mouth 2 (two) times daily.    . multivitamin (RENA-VIT) TABS tablet Take 1 tablet by mouth daily.    [name] NOVOLOG FLEXPEN 100 UNIT/ML FlexPen inject 35 units subcutaneously three times a day with food 30 mL 1  . ONE TOUCH ULTRA TEST test strip TEST BLOOD SUGAR four times a day 100 each 3  . pantoprazole (PROTONIX) 40 MG tablet take 1 tablet by mouth once daily 90 tablet 3  . predniSONE (DELTASONE) 5 MG tablet Alternate 10 mg one day, 5 mg next, etc 50 tablet 10  . SENSIPAR 30 MG tablet Take 30 mg by mouth at bedtime. Reported on 04/29/2016    . sevelamer carbonate (RENVELA) 800 MG tablet Take 800 mg by mouth 3 (three) times daily. And 2 tablet with snacks    . TRESIBA FLEXTOUCH 100 UNIT/ML SOPN FlexTouch Pen inject 40 units subcutaneously once daily 15 mL 2  . valsartan (DIOVAN) 80 MG tablet Take 80 mg by mouth at bedtime.   0  . verapamil (CALAN-SR) 120 MG CR tablet Take 1 tablet (120 mg total) by mouth at bedtime. 30 tablet 1  . furosemide (LASIX) 40 MG tablet Take 1 tablet (40 mg) by mouth once daily in the afternoon. 90  tablet 3   No current facility-administered medications for this visit.     Allergies:   Oxycodone-acetaminophen and Tape   Social History:  The patient  reports that he has never smoked. He has never used smokeless tobacco. He reports that he does not drink alcohol or use drugs.   Family History:  The patient's family history includes Diabetes in his mother; Hypertension in his maternal grandmother and mother.  ROS:   Review of Systems  Constitutional: Positive for malaise/fatigue. Negative for chills, diaphoresis, fever and weight loss.  HENT: Negative for congestion.   Eyes: Negative for discharge and redness.  Respiratory: Positive for shortness of breath. Negative for cough, hemoptysis, sputum production and wheezing.   Cardiovascular: Positive for orthopnea and leg swelling. Negative for chest pain, palpitations, claudication and PND.       2-pillow orthopnea   Gastrointestinal:  Negative for abdominal pain, blood in stool, heartburn, melena, nausea and vomiting.  Genitourinary: Negative for hematuria.  Musculoskeletal: Negative for falls and myalgias.  Skin: Negative for rash.  Neurological: Negative for dizziness, tingling, tremors, sensory change, speech change, focal weakness, loss of consciousness and weakness.  Endo/Heme/Allergies: Does not bruise/bleed easily.  Psychiatric/Behavioral: Negative for substance abuse. The patient is not nervous/anxious.   All other systems reviewed and are negative.    PHYSICAL EXAM:  VS:  BP 140/72 (BP Location: Right Arm, Patient Position: Sitting, Cuff Size: Normal)   Pulse 76   Ht 6\' 1"  (1.854 m)   Wt (!) 317 lb (143.8 kg)   BMI 41.82 kg/m  BMI: Body mass index is 41.82 kg/m.  Physical Exam  Constitutional: He is oriented to person, place, and time. He appears well-developed and well-nourished.  HENT:  Head: Normocephalic and atraumatic.  Eyes: Right eye exhibits no discharge. Left eye exhibits no discharge.  Neck: Normal range  of motion. No JVD present.  Cardiovascular: Normal rate, regular rhythm, S1 normal, S2 normal and normal heart sounds.  Exam reveals no distant heart sounds, no friction rub, no midsystolic click and no opening snap.   No murmur heard. Pulmonary/Chest: Effort normal and breath sounds normal. No respiratory distress. He has no decreased breath sounds. He has no wheezes. He has no rales. He exhibits no tenderness.  Not on oxygen at this time  Abdominal: Soft. He exhibits no distension. There is no tenderness.  Musculoskeletal: He exhibits edema.  1+ pitting edema to the mid shins with signs of chronic venous stasis   Neurological: He is alert and oriented to person, place, and time.  Skin: Skin is warm and dry. No cyanosis. Nails show no clubbing.  Psychiatric: He has a normal mood and affect. His speech is normal and behavior is normal. Judgment and thought content normal.     EKG:  Was ordered and interpreted by me today. Shows NSR, 76 bpm, left anterior fascicular block, poor R wave progression, no acute st/t changes   Recent Labs: 08/28/2016: ALT 32 08/29/2016: TSH 4.693 09/07/2016: B Natriuretic Peptide 1,690.0 09/08/2016: Hemoglobin 11.0; Platelets 357 09/10/2016: BUN 73; Creatinine, Ser 9.11; Potassium 4.7; Sodium 136  07/31/2016: Cholesterol 112; HDL 50.40; LDL Cholesterol 29; Total CHOL/HDL Ratio 2; Triglycerides 164.0; VLDL 32.8   CrCl cannot be calculated (Patient's most recent lab result is older than the maximum 21 days allowed.).   Wt Readings from Last 3 Encounters:  10/09/16 (!) 317 lb (143.8 kg)  10/02/16 (!) 316 lb 12.8 oz (143.7 kg)  09/16/16 (!) 318 lb (144.2 kg)     Other studies reviewed: Additional studies/records reviewed today include: summarized above  ASSESSMENT AND PLAN:  1. Acute on chronic diastolic CHF/pulmonary hypertension: Fluid is primarily managed by hemodialysis, though he does still make some urine. Add Lasix 40 mg q PM to standing dose of 80  mg daily. His fluid overload is likely in the setting of prolonged prednisone and intermittently poorly controlled hypertension. Needs some buffer on BP given history of soft BP with HD. Once he is adequately diuresed, primarily through HD, can plan for possible RHC. Patient declined ischemic evaluation at this time.  2. Chronic respiratory failure with hypoxia: Notes improved breathing on current dose of prednisone. Not on oxygen at this time. Per pulmonary.  3. Morbid obesity with restrictive pulmonary disease: Weight loss advised.  4. ESRD on HD: Per renal.  5. BOOP: Per pulmonary.  6. Hypertensive heart disease: As  above.   Disposition: F/u with Dr. Mariah Milling in 1 month.   Current medicines are reviewed at length with the patient today.  The patient did not have any concerns regarding medicines.  Elinor Dodge PA-C 10/09/2016 4:05 PM     [facility] [address] [place], [place] [address] [phone]

## 2016-10-28 ENCOUNTER — Other Ambulatory Visit: Payer: Self-pay | Admitting: Family Medicine

## 2016-10-28 ENCOUNTER — Ambulatory Visit (INDEPENDENT_AMBULATORY_CARE_PROVIDER_SITE_OTHER): Payer: Medicare Other

## 2016-10-28 ENCOUNTER — Other Ambulatory Visit: Payer: Self-pay

## 2016-10-28 VITALS — BP 138/70 | HR 74 | Temp 98.1°F | Resp 14 | Ht 73.0 in | Wt 318.4 lb

## 2016-10-28 DIAGNOSIS — Z0000 Encounter for general adult medical examination without abnormal findings: Secondary | ICD-10-CM | POA: Diagnosis not present

## 2016-10-28 DIAGNOSIS — J8489 Other specified interstitial pulmonary diseases: Secondary | ICD-10-CM

## 2016-10-28 MED ORDER — ALBUTEROL SULFATE HFA 108 (90 BASE) MCG/ACT IN AERS: 2.0000 | INHALATION_SPRAY | Freq: Four times a day (QID) | RESPIRATORY_TRACT | 0 refills | Status: AC | PRN

## 2016-10-28 MED ORDER — VERAPAMIL HCL ER 120 MG PO TBCR: 120.0000 mg | EXTENDED_RELEASE_TABLET | Freq: Every day | ORAL | 1 refills | Status: DC

## 2016-10-28 MED ORDER — VERAPAMIL HCL ER 120 MG PO TBCR: 120.0000 mg | EXTENDED_RELEASE_TABLET | Freq: Every day | ORAL | 1 refills | Status: AC

## 2016-10-28 NOTE — Telephone Encounter (Signed)
Verapamil- refilled 09/11/16. Albuterol- refilled 08/12/16. Pt last seen 06/03/16. Please advise?

## 2016-10-28 NOTE — Progress Notes (Signed)
Subjective:   Ryan Arnold is a 56 y.o. male who presents for an Initial Medicare Annual Wellness Visit.  Review of Systems  No ROS.  Medicare Wellness Visit.  Cardiac Risk Factors include: male gender;advanced age (>6men, >68 women);hypertension;obesity (BMI >30kg/m2);diabetes mellitus;sedentary lifestyle    Objective:    Today's Vitals   10/28/16 1606  BP: 138/70  Pulse: 74  Resp: 14  Temp: 98.1 F (36.7 C)  TempSrc: Oral  Weight: (!) 318 lb 6.4 oz (144.4 kg)  Height: 6\' 1"  (1.854 m)   Body mass index is 42.01 kg/m.  Current Medications (verified) Outpatient Encounter Prescriptions as of 10/28/2016  Medication Sig  . albuterol (PROVENTIL HFA) 108 (90 Base) MCG/ACT inhaler Inhale 2 puffs into the lungs every 6 (six) hours as needed for wheezing or shortness of breath.  [name] aspirin EC 81 MG tablet Take 81 mg by mouth daily.  [name] atorvastatin (LIPITOR) 40 MG tablet Take 40 mg by mouth daily.  . B-D ULTRAFINE III SHORT PEN 31G X 8 MM MISC use as directed  . DULoxetine (CYMBALTA) 60 MG capsule Take 2 capsules (120 mg total) by mouth daily.  . furosemide (LASIX) 40 MG tablet Take 1 tablet (40 mg) by mouth once daily in the afternoon.  . furosemide (LASIX) 80 MG tablet Take 1 tablet (80 mg total) by mouth daily.  [name] gabapentin (NEURONTIN) 300 MG capsule take 1 capsule by mouth three times a day  . metoprolol (LOPRESSOR) 50 MG tablet Take 50 mg by mouth 2 (two) times daily.  . multivitamin (RENA-VIT) TABS tablet Take 1 tablet by mouth daily.  [name] NOVOLOG FLEXPEN 100 UNIT/ML FlexPen inject 35 units subcutaneously three times a day with food  . ONE TOUCH ULTRA TEST test strip TEST BLOOD SUGAR four times a day  . pantoprazole (PROTONIX) 40 MG tablet take 1 tablet by mouth once daily  . predniSONE (DELTASONE) 5 MG tablet Alternate 10 mg one day, 5 mg next, etc  . SENSIPAR 30 MG tablet Take 30 mg by mouth at bedtime. Reported on 04/29/2016  . sevelamer carbonate (RENVELA) 800 MG tablet  Take 800 mg by mouth 3 (three) times daily. And 2 tablet with snacks  . TRESIBA FLEXTOUCH 100 UNIT/ML SOPN FlexTouch Pen inject 40 units subcutaneously once daily  . valsartan (DIOVAN) 80 MG tablet Take 80 mg by mouth at bedtime.   . verapamil (CALAN-SR) 120 MG CR tablet Take 1 tablet (120 mg total) by mouth at bedtime.  . [DISCONTINUED] albuterol (PROVENTIL HFA) 108 (90 Base) MCG/ACT inhaler Inhale 2 puffs into the lungs every 6 (six) hours as needed for wheezing or shortness of breath.  . [DISCONTINUED] verapamil (CALAN-SR) 120 MG CR tablet Take 1 tablet (120 mg total) by mouth at bedtime.   No facility-administered encounter medications on file as of 10/28/2016.     Allergies (verified) Oxycodone-acetaminophen and Tape   History: Past Medical History:  Diagnosis Date  . Anemia of chronic disease   . BOOP (bronchiolitis obliterans with organizing pneumonia) (HCC)    a. 01/2018 s/p R thoracoscopy/Bx confirming BOOP;  b. 02/2018 high dose steroids started.  . Chest pain    a. 06/2014 Myoview ([facility]) no ischemia/infarct, nl EF.  [name] Chronic diastolic CHF (congestive heart failure) (HCC)    a. 12/2014 Echo: EF 50-55%, mild conc LVH, mildly dil LA.  . End stage renal disease (HCC)    a. on dialysis; still makes urine.  . Essential hypertension   . Hyperlipemia   .  Morbid obesity (HCC)   . OSA treated with BiPAP   . Recurrent pneumonia    a. 2/2 BOOP.  [name]. Renal insufficiency   . Secondary hyperparathyroidism (HCC)   . Thyroid disease   . Type 1 diabetes mellitus (HCC)    Past Surgical History:  Procedure Laterality Date  . cataract Bilateral   . GALLBLADDER SURGERY    . KNEE SURGERY Right   . LUNG BIOPSY    . SHOULDER SURGERY Left    Family History  Problem Relation Age of Onset  . Diabetes Mother     died @ 6462.  [name]. Hypertension Mother   . Hypertension Maternal Grandmother   . Cancer Brother    Social History   Occupational History  . Not on file.   Social History Main  Topics  . Smoking status: Never Smoker  . Smokeless tobacco: Never Used  . Alcohol use No  . Drug use: No  . Sexual activity: No   Tobacco Counseling Counseling given: Not Answered   Activities of Daily Living In your present state of health, do you have any difficulty performing the following activities: 10/28/2016 09/07/2016  Hearing? N N  Vision? N N  Difficulty concentrating or making decisions? N N  Walking or climbing stairs? Y N  Dressing or bathing? N N  Doing errands, shopping? N N  Preparing Food and eating ? N -  Using the Toilet? N -  In the past six months, have you accidently leaked urine? N -  Do you have problems with loss of bowel control? N -  Managing your Medications? N -  Managing your Finances? N -  Housekeeping or managing your Housekeeping? N -  Some recent data might be hidden    Immunizations and Health Maintenance Immunization History  Administered Date(s) Administered  . Influenza Split 05/20/2016  . Influenza-Unspecified 07/05/2014, 06/11/2015   Health Maintenance Due  Topic Date Due  . Hepatitis C Screening  02/08/1961  . PNEUMOCOCCAL POLYSACCHARIDE VACCINE (1) 07/19/1965  . HIV Screening  07/19/1978  . TETANUS/TDAP  07/19/1982    Patient Care Team: Tommie SamsJayce G Cook, DO as PCP - General (Family Medicine) Delma Freezeina A Hackney, FNP as Nurse Practitioner (Family Medicine) Stephanie AcreVishal Mungal, MD as Consulting Physician (Pulmonary Disease) Iran OuchMuhammad A Arida, MD as Consulting Physician (Cardiology) Reather LittlerAjay Kumar, MD as Consulting Physician (Endocrinology)  Indicate any recent Medical Services you may have received from other than [facility] providers in the past year (date may be approximate).    Assessment:   This is a routine wellness examination for [name]. The goal of the wellness visit is to assist the patient how to close the gaps in care and create a preventative care plan for the patient.   Osteoporosis risk reviewed.  Medications reviewed; taking  without issues or barriers.  Safety issues reviewed; smoke detectors in the home. Firearms locked in a safe within the home. Wears seatbelts when driving or riding with others. No violence in the home.  No identified risk were noted; The patient was oriented x 3; appropriate in dress and manner and no objective failures at ADL's or IADL's. Cane in use when ambulating.  BMI; discussed the importance of a healthy diet, water intake and exercise. Educational material provided.  HTN; followed by PCP.  Hepatitis C screening discussed; educational material provided.  TDAP vaccine deferred for follow up with insurance.  Educational material provided.  Patient Concerns: Hx of falls; request referral for balance therapy.  Deferred to PCP for  follow up at upcoming CPE.  Hearing/Vision screen Hearing Screening Comments: Passes the whisper test Vision Screening Comments: Followed by [facility] (Dr. Wonda Olds) Reports Hx of retinopathy  Wears glasses Visual acuity not assessed per patient preference since he has regular follow up with his ophthalmologist   Dietary issues and exercise activities discussed: Current Exercise Habits: The patient does not participate in regular exercise at present  Goals    . Healthy Lifestyle          Low carb foods.  Lean meats, vegetables. Stay hydrated and drink fluids within restriction. Stay active and do range of motion exercises as demonstrated.        Depression Screen PHQ 2/9 Scores 10/28/2016 08/12/2016 08/07/2016 04/25/2016  PHQ - 2 Score 0 0 0 0    Fall Risk Fall Risk  10/28/2016 08/12/2016 08/07/2016 04/25/2016 12/20/2015  Falls in the past year? Yes No No Yes Yes  Number falls in past yr: 2 or more - - 2 or more 1  Injury with Fall? Yes - - No No  Risk Factor Category  High Fall Risk - - - -  Risk for fall due to : History of fall(s) - - History of fall(s) History of fall(s)  Follow up Falls prevention discussed;Education provided - -  Education provided Falls prevention discussed    Cognitive Function:     6CIT Screen 10/28/2016  What Year? 0 points  What month? 0 points  What time? 0 points  Count back from 20 0 points  Months in reverse 0 points    Screening Tests Health Maintenance  Topic Date Due  . Hepatitis C Screening  August 07, 1961  . PNEUMOCOCCAL POLYSACCHARIDE VACCINE (1) 07/19/1965  . HIV Screening  07/19/1978  . TETANUS/TDAP  07/19/1982  . FOOT EXAM  12/10/2016  . HEMOGLOBIN A1C  03/08/2017  . OPHTHALMOLOGY EXAM  03/17/2017  . COLONOSCOPY  08/15/2022  . INFLUENZA VACCINE  Addressed        Plan:   End of life planning; Advance aging; Advanced directives discussed. No HCPOA/Living Will.  Additional information declined at this time.    Medicare Attestation I have personally reviewed: The patient's medical and social history Their use of alcohol, tobacco or illicit drugs Their current medications and supplements The patient's functional ability including ADLs,fall risks, home safety risks, cognitive, and hearing and visual impairment Diet and physical activities Evidence for depression   The patient's weight, height, BMI, and visual acuity have been recorded in the chart.  I have made referrals and provided education to the patient based on review of the above and I have provided the patient with a written personalized care plan for preventive services.    During the course of the visit Cristin was educated and counseled about the following appropriate screening and preventive services:   Vaccines to include Pneumoccal, Influenza, Hepatitis B, Td, Zostavax, HCV  Electrocardiogram  Colorectal cancer screening  Cardiovascular disease screening  Diabetes screening  Glaucoma screening  Nutrition counseling  Prostate cancer screening  Smoking cessation counseling  Patient Instructions (the written plan) were given to the patient.   Ashok Pall, LPN   [id number]/[id number]

## 2016-10-28 NOTE — Addendum Note (Signed)
Addended by: Jennelle HumanNEWTON, ASHLEIGH E on: 10/28/2016 04:38 PM   Modules accepted: Orders

## 2016-10-28 NOTE — Patient Instructions (Addendum)
Ryan Arnold , Thank you for taking time to come for your Medicare Wellness Visit. I appreciate your ongoing commitment to your health goals. Please review the following plan we discussed and let me know if I can assist you in the future.   Follow up with Dr. Adriana Simas as needed.  These are the goals we discussed: Goals    . Healthy Lifestyle          Low carb foods.  Lean meats, vegetables. Stay hydrated and drink fluids within restriction. Stay active and do range of motion exercises as demonstrated.         This is a list of the screening recommended for you and due dates:  Health Maintenance  Topic Date Due  .  Hepatitis C: One time screening is recommended by Center for Disease Control  (CDC) for  adults born from [birth year] through 1963.   14-Feb-1961  . Pneumococcal vaccine (1) 07/19/1965  . HIV Screening  07/19/1978  . Tetanus Vaccine  07/19/1982  . Complete foot exam   12/10/2016  . Hemoglobin A1C  03/08/2017  . Eye exam for diabetics  03/17/2017  . Colon Cancer Screening  08/15/2022  . Flu Shot  Addressed      Fall Prevention in the Home Introduction Falls can cause injuries. They can happen to people of all ages. There are many things you can do to make your home safe and to help prevent falls. What can I do on the outside of my home?  Regularly fix the edges of walkways and driveways and fix any cracks.  Remove anything that might make you trip as you walk through a door, such as a raised step or threshold.  Trim any bushes or trees on the path to your home.  Use bright outdoor lighting.  Clear any walking paths of anything that might make someone trip, such as rocks or tools.  Regularly check to see if handrails are loose or broken. Make sure that both sides of any steps have handrails.  Any raised decks and porches should have guardrails on the edges.  Have any leaves, snow, or ice cleared regularly.  Use sand or salt on walking paths during winter.  Clean up  any spills in your garage right away. This includes oil or grease spills. What can I do in the bathroom?  Use night lights.  Install grab bars by the toilet and in the tub and shower. Do not use towel bars as grab bars.  Use non-skid mats or decals in the tub or shower.  If you need to sit down in the shower, use a plastic, non-slip stool.  Keep the floor dry. Clean up any water that spills on the floor as soon as it happens.  Remove soap buildup in the tub or shower regularly.  Attach bath mats securely with double-sided non-slip rug tape.  Do not have throw rugs and other things on the floor that can make you trip. What can I do in the bedroom?  Use night lights.  Make sure that you have a light by your bed that is easy to reach.  Do not use any sheets or blankets that are too big for your bed. They should not hang down onto the floor.  Have a firm chair that has side arms. You can use this for support while you get dressed.  Do not have throw rugs and other things on the floor that can make you trip. What can I do in  the kitchen?  Clean up any spills right away.  Avoid walking on wet floors.  Keep items that you use a lot in easy-to-reach places.  If you need to reach something above you, use a strong step stool that has a grab bar.  Keep electrical cords out of the way.  Do not use floor polish or wax that makes floors slippery. If you must use wax, use non-skid floor wax.  Do not have throw rugs and other things on the floor that can make you trip. What can I do with my stairs?  Do not leave any items on the stairs.  Make sure that there are handrails on both sides of the stairs and use them. Fix handrails that are broken or loose. Make sure that handrails are as long as the stairways.  Check any carpeting to make sure that it is firmly attached to the stairs. Fix any carpet that is loose or worn.  Avoid having throw rugs at the top or bottom of the stairs. If  you do have throw rugs, attach them to the floor with carpet tape.  Make sure that you have a light switch at the top of the stairs and the bottom of the stairs. If you do not have them, ask someone to add them for you. What else can I do to help prevent falls?  Wear shoes that:  Do not have high heels.  Have rubber bottoms.  Are comfortable and fit you well.  Are closed at the toe. Do not wear sandals.  If you use a stepladder:  Make sure that it is fully opened. Do not climb a closed stepladder.  Make sure that both sides of the stepladder are locked into place.  Ask someone to hold it for you, if possible.  Clearly mark and make sure that you can see:  Any grab bars or handrails.  First and last steps.  Where the edge of each step is.  Use tools that help you move around (mobility aids) if they are needed. These include:  Canes.  Walkers.  Scooters.  Crutches.  Turn on the lights when you go into a dark area. Replace any light bulbs as soon as they burn out.  Set up your furniture so you have a clear path. Avoid moving your furniture around.  If any of your floors are uneven, fix them.  If there are any pets around you, be aware of where they are.  Review your medicines with your doctor. Some medicines can make you feel dizzy. This can increase your chance of falling. Ask your doctor what other things that you can do to help prevent falls. This information is not intended to replace advice given to you by your health care provider. Make sure you discuss any questions you have with your health care provider. Document Released: 07/26/2009 Document Revised: 03/06/2016 Document Reviewed: 11/03/2014  2017 Elsevier   Hearing Loss Introduction Hearing loss is a partial or total loss of the ability to hear. This can be temporary or permanent, and it can happen in one or both ears. Hearing loss may be referred to as deafness. Medical care is necessary to treat  hearing loss properly and to prevent the condition from getting worse. Your hearing may partially or completely come back, depending on what caused your hearing loss and how severe it is. In some cases, hearing loss is permanent. What are the causes? Common causes of hearing loss include:  Too much wax in  the ear canal.  Infection of the ear canal or middle ear.  Fluid in the middle ear.  Injury to the ear or surrounding area.  An object stuck in the ear.  Prolonged exposure to loud sounds, such as music. Less common causes of hearing loss include:  Tumors in the ear.  Viral or bacterial infections, such as meningitis.  A hole in the eardrum (perforated eardrum).  Problems with the hearing nerve that sends signals between the brain and the ear.  Certain medicines. What are the signs or symptoms? Symptoms of this condition may include:  Difficulty telling the difference between sounds.  Difficulty following a conversation when there is background noise.  Lack of response to sounds in your environment. This may be most noticeable when you do not respond to startling sounds.  Needing to turn up the volume on the television, radio, etc.  Ringing in the ears.  Dizziness.  Pain in the ears. How is this diagnosed? This condition is diagnosed based on a physical exam and a hearing test (audiometry). The audiometry test will be performed by a hearing specialist (audiologist). You may also be referred to an ear, nose, and throat (ENT) specialist (otolaryngologist). How is this treated? Treatment for recent onset of hearing loss may include:  Ear wax removal.  Being prescribed medicines to prevent infection (antibiotics).  Being prescribed medicines to reduce inflammation (corticosteroids). Follow these instructions at home:  If you were prescribed an antibiotic medicine, take it as told by your health care provider. Do not stop taking the antibiotic even if you start to  feel better.  Take over-the-counter and prescription medicines only as told by your health care provider.  Avoid loud noises.  Return to your normal activities as told by your health care provider. Ask your health care provider what activities are safe for you.  Keep all follow-up visits as told by your health care provider. This is important. Contact a health care provider if:  You feel dizzy.  You develop new symptoms.  You vomit or feel nauseous.  You have a fever. Get help right away if:  You develop sudden changes in your vision.  You have severe ear pain.  You have new or increased weakness.  You have a severe headache. This information is not intended to replace advice given to you by your health care provider. Make sure you discuss any questions you have with your health care provider. Document Released: 09/29/2005 Document Revised: 03/06/2016 Document Reviewed: 02/14/2015  2017 Elsevier

## 2016-10-28 NOTE — Telephone Encounter (Signed)
rx was re-sent electronically.

## 2016-10-28 NOTE — Telephone Encounter (Signed)
-----   Message from Varney Bilesenisa L O'Brien-Blaney, LPN sent at [id number]/65/[id number]/16/2018  4:03 PM EST ----- Pt says he needs a refill on verapamil and inhaler  [facility] please Thanks, Denisa

## 2016-10-28 NOTE — Telephone Encounter (Signed)
error 

## 2016-10-29 NOTE — Progress Notes (Signed)
Care was provided under my supervision. I agree with the management as indicated in the note.  Marlen Koman DO  

## 2016-10-30 ENCOUNTER — Ambulatory Visit: Payer: BC Managed Care – PPO | Admitting: Endocrinology

## 2016-10-30 IMAGING — CR DG CHEST 2V
1 series · 2 of 2 positions shown · non-contrast
Comparison: PA and lateral chest 02/04/2015 and 02/02/2015.

CLINICAL DATA: Recurrent pneumonia.  Recent lung biopsy.

EXAM:
CHEST  2 VIEW

[Series 1: w chest pa · 0.14mm/px · 2 of 2 slices shown]
[im 1/2]
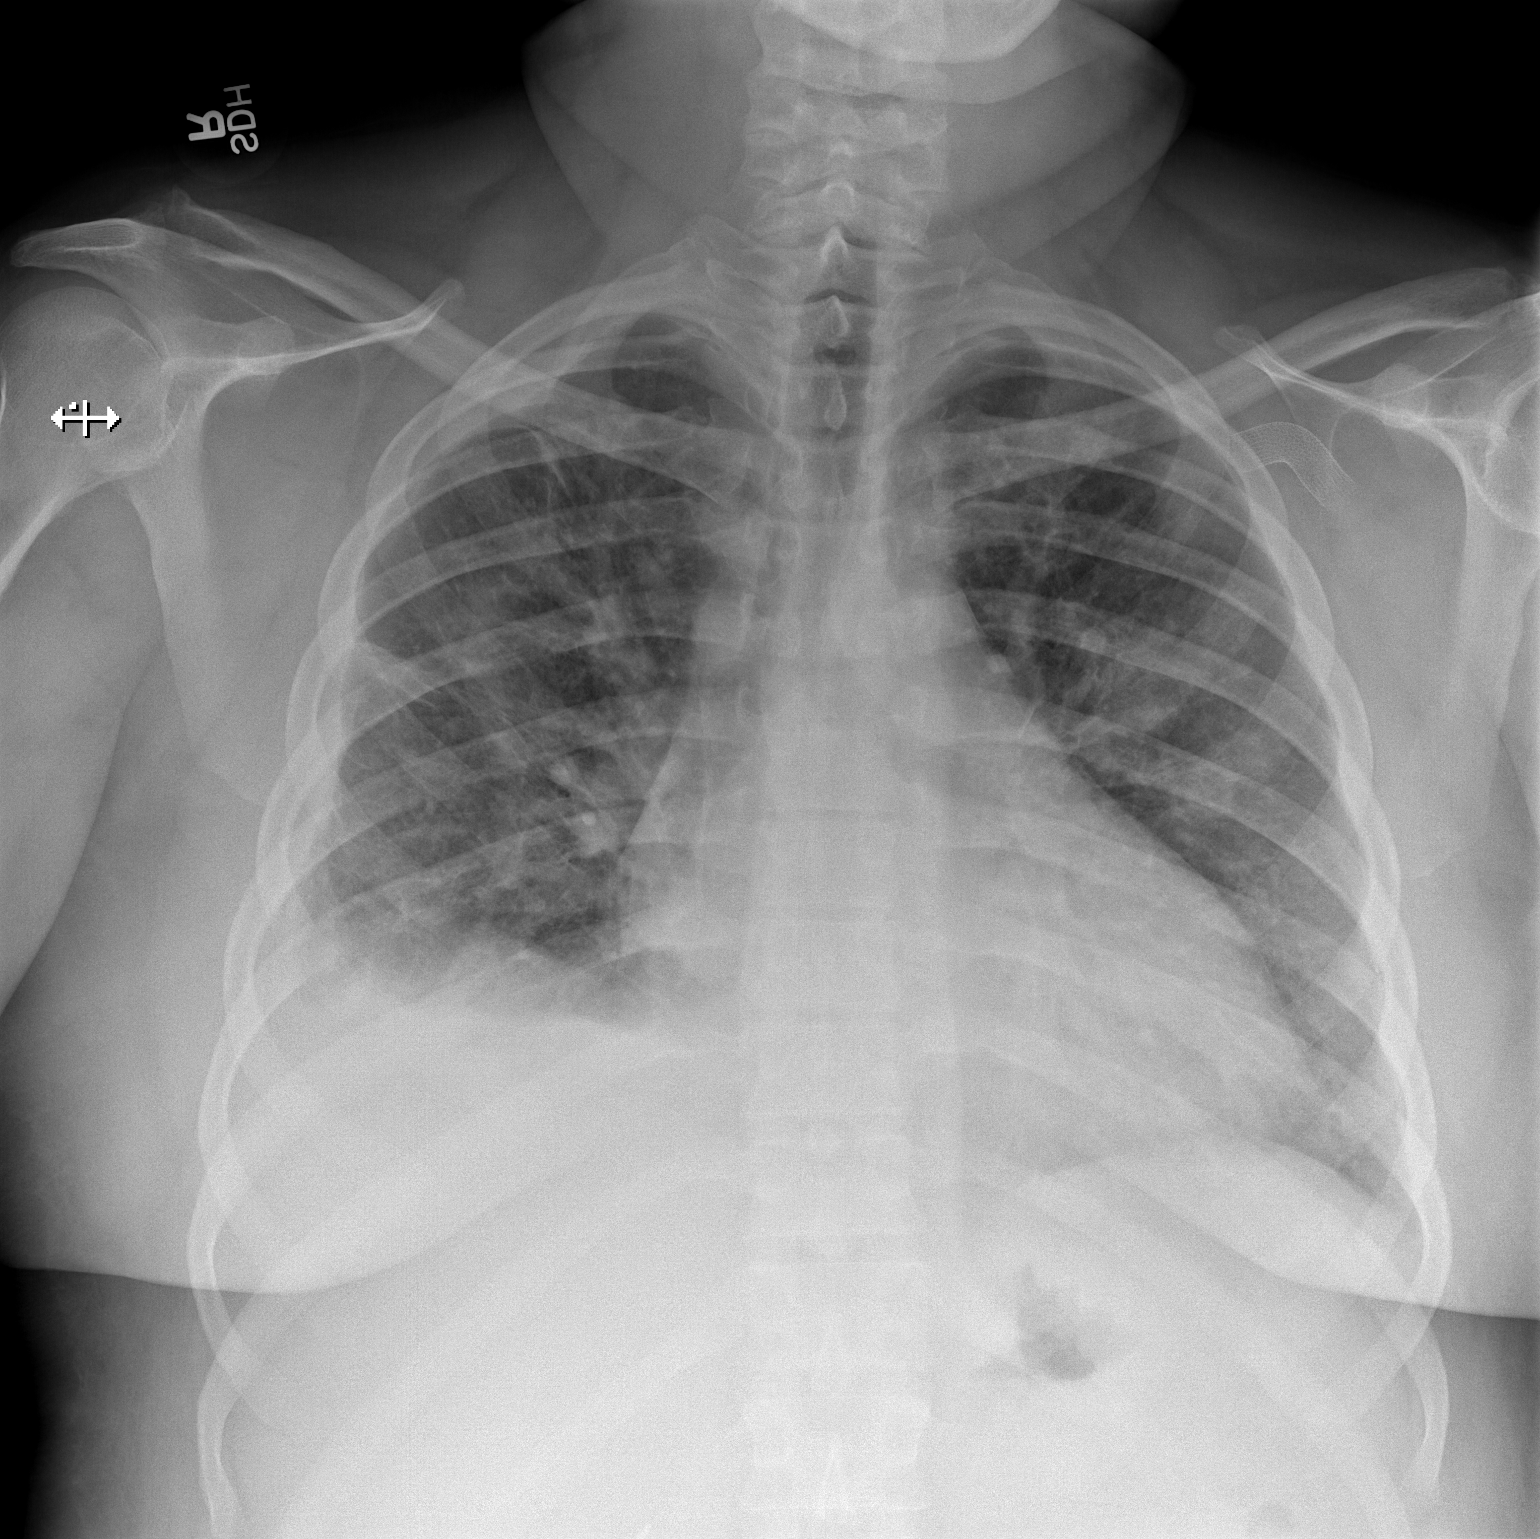
[im 2/2]
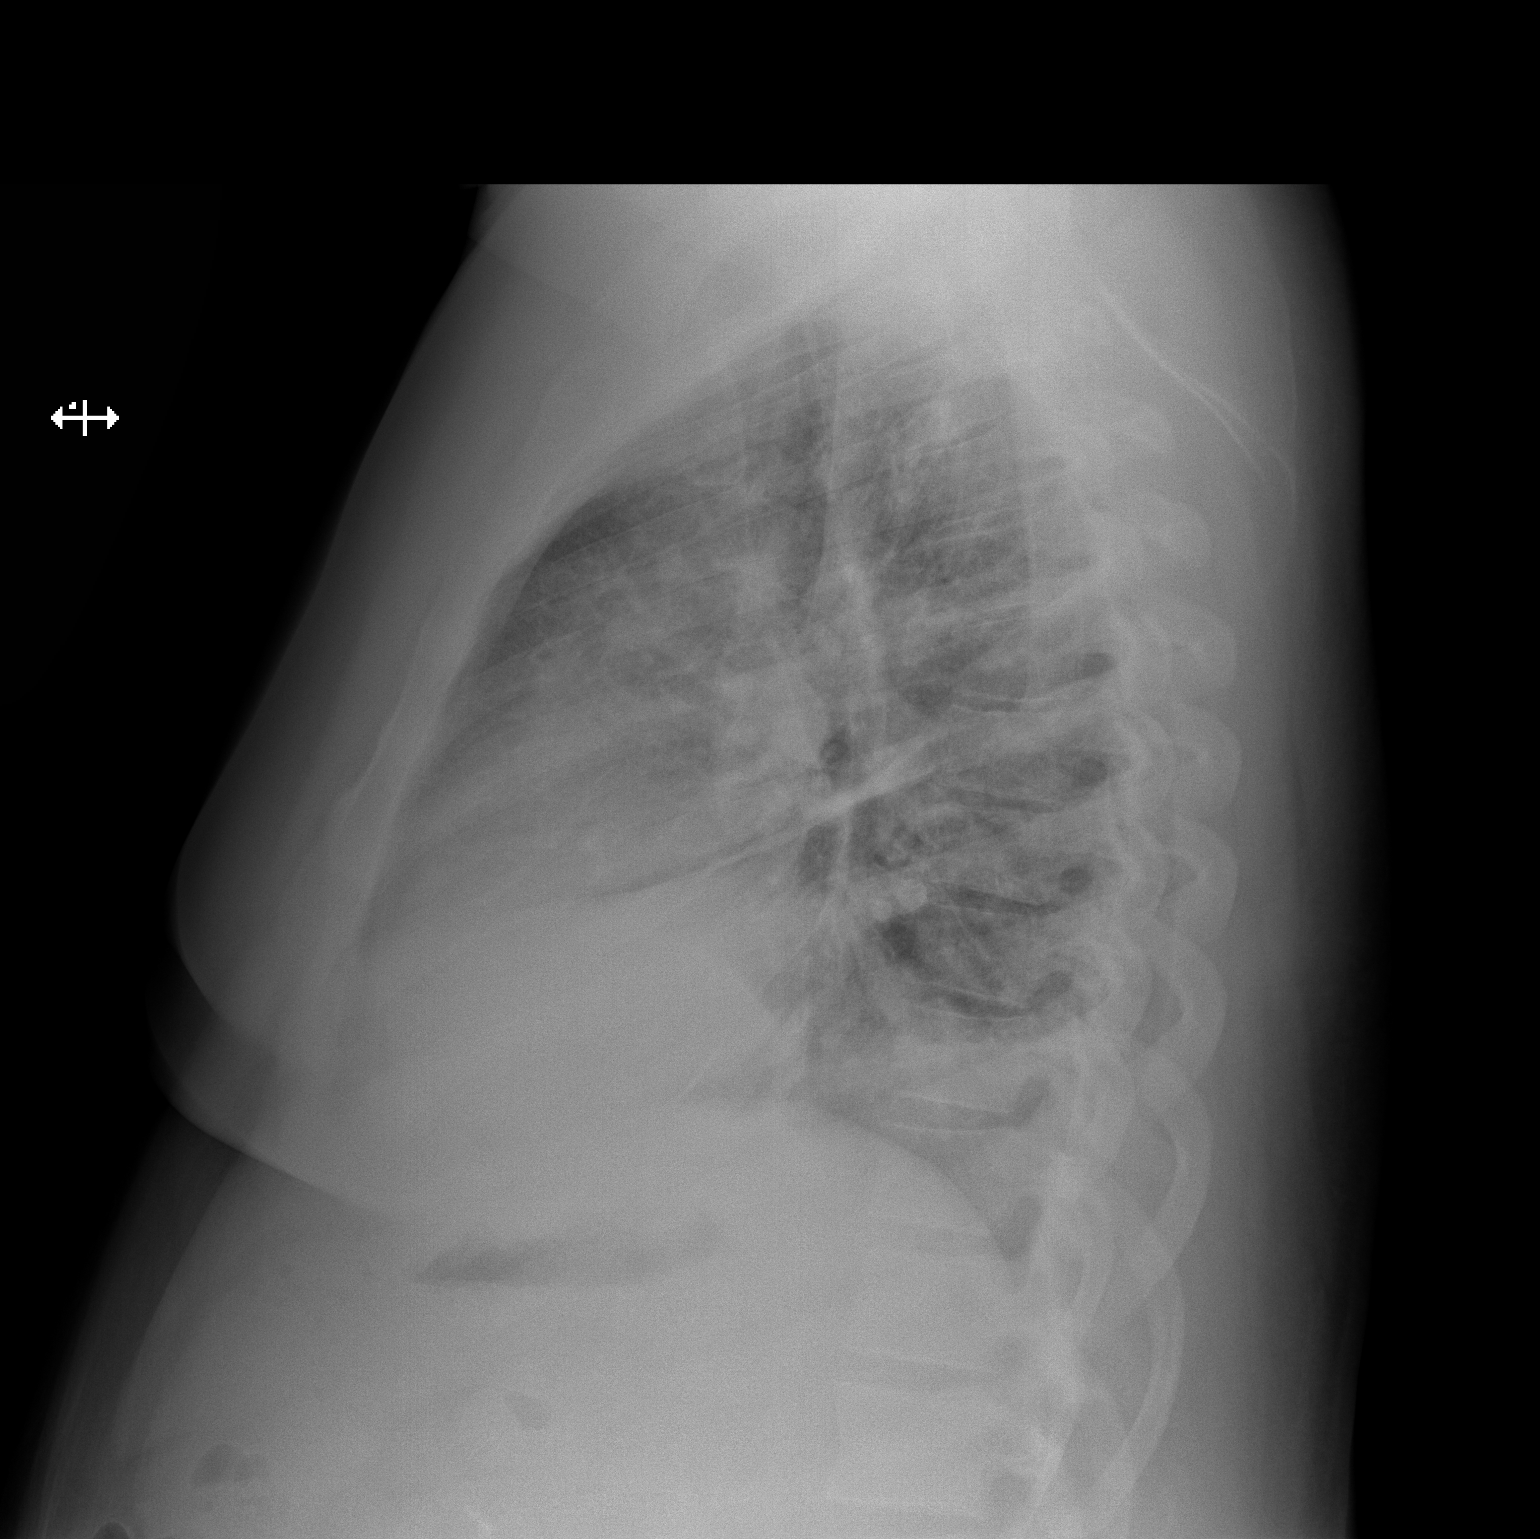

[2 of 2 positions shown; findings below may reference images not displayed]

FINDINGS: There has been some increase in a small right pleural effusion.
Patchy bilateral airspace disease persists and appears slightly
worsened, particularly in the left lung, since yesterday's exam.
Possible tiny right apical pneumothorax seen on yesterday's
examination is not appreciated today. Heart size is upper normal.
IMPRESSION: No pneumothorax is identified on today's examination.

Patchy bilateral airspace disease appears slightly worsened compared
to yesterday's study.

Some increase in a small right pleural effusion.

## 2016-11-03 ENCOUNTER — Other Ambulatory Visit: Payer: Self-pay | Admitting: Family Medicine

## 2016-11-03 ENCOUNTER — Other Ambulatory Visit: Payer: Self-pay | Admitting: Endocrinology

## 2016-11-04 ENCOUNTER — Ambulatory Visit (INDEPENDENT_AMBULATORY_CARE_PROVIDER_SITE_OTHER): Payer: Medicare Other | Admitting: Family Medicine

## 2016-11-04 ENCOUNTER — Encounter: Payer: Self-pay | Admitting: Family Medicine

## 2016-11-04 DIAGNOSIS — E108 Type 1 diabetes mellitus with unspecified complications: Secondary | ICD-10-CM | POA: Diagnosis not present

## 2016-11-04 DIAGNOSIS — E785 Hyperlipidemia, unspecified: Secondary | ICD-10-CM | POA: Diagnosis not present

## 2016-11-04 DIAGNOSIS — J8489 Other specified interstitial pulmonary diseases: Secondary | ICD-10-CM

## 2016-11-04 DIAGNOSIS — G4733 Obstructive sleep apnea (adult) (pediatric): Secondary | ICD-10-CM

## 2016-11-04 DIAGNOSIS — I10 Essential (primary) hypertension: Secondary | ICD-10-CM | POA: Diagnosis not present

## 2016-11-04 NOTE — Progress Notes (Deleted)
Subjective:  Patient ID: Ryan Arnold, male    DOB: [birth year]  Age: 56 y.o. MRN: [id number]  CC: Physical exam  HPI Ryan Arnold is a 56 y.o. male presents to the clinic today for an annual physical exam. Patient has an extensive past medical history including type 1 diabetes, ESRD on hemodialysis, morbid obesity, OSA, hypertension, hyperlipidemia, BOOP, CHF, Chronic respiratory failure.    PMH, Surgical Hx, Family Hx, Social History reviewed and updated as below.  Past Medical History:  Diagnosis Date  . Anemia of chronic disease   . BOOP (bronchiolitis obliterans with organizing pneumonia) (HCC)    a. 01/2018 s/p R thoracoscopy/Bx confirming BOOP;  b. 02/2018 high dose steroids started.  . Chest pain    a. 06/2014 Myoview ([facility]) no ischemia/infarct, nl EF.  [name] Chronic diastolic CHF (congestive heart failure) (HCC)    a. 12/2014 Echo: EF 50-55%, mild conc LVH, mildly dil LA.  . End stage renal disease (HCC)    a. on dialysis; still makes urine.  . Essential hypertension   . Hyperlipemia   . Morbid obesity (HCC)   . OSA treated with BiPAP   . Recurrent pneumonia    a. 2/2 BOOP.  [name] Secondary hyperparathyroidism (HCC)   . Type 1 diabetes mellitus (HCC)    Past Surgical History:  Procedure Laterality Date  . cataract Bilateral   . GALLBLADDER SURGERY    . KNEE SURGERY Right   . LUNG BIOPSY    . SHOULDER SURGERY Left    Family History  Problem Relation Age of Onset  . Diabetes Mother     died @ 49.  [name] Hypertension Mother   . Hypertension Maternal Grandmother   . Cancer Brother    Social History  Substance Use Topics  . Smoking status: Never Smoker  . Smokeless tobacco: Never Used  . Alcohol use No   Review of Systems  Objective:   Today's Vitals: There were no vitals taken for this visit.  Physical Exam   Assessment & Plan:   Problem List Items Addressed This Visit    None      Outpatient Encounter Prescriptions as of 11/04/2016  Medication Sig  .  albuterol (PROVENTIL HFA) 108 (90 Base) MCG/ACT inhaler Inhale 2 puffs into the lungs every 6 (six) hours as needed for wheezing or shortness of breath.  [name] aspirin EC 81 MG tablet Take 81 mg by mouth daily.  [name] atorvastatin (LIPITOR) 40 MG tablet Take 40 mg by mouth daily.  . B-D ULTRAFINE III SHORT PEN 31G X 8 MM MISC use as directed  . DULoxetine (CYMBALTA) 60 MG capsule take 2 capsules by mouth once daily  . furosemide (LASIX) 40 MG tablet Take 1 tablet (40 mg) by mouth once daily in the afternoon.  . furosemide (LASIX) 80 MG tablet Take 1 tablet (80 mg total) by mouth daily.  [name] gabapentin (NEURONTIN) 300 MG capsule take 1 capsule by mouth three times a day  . metoprolol (LOPRESSOR) 50 MG tablet Take 50 mg by mouth 2 (two) times daily.  . multivitamin (RENA-VIT) TABS tablet Take 1 tablet by mouth daily.  [name] NOVOLOG FLEXPEN 100 UNIT/ML FlexPen inject 35 units subcutaneously three times a day with food  . ONE TOUCH ULTRA TEST test strip TEST BLOOD SUGAR four times a day  . pantoprazole (PROTONIX) 40 MG tablet take 1 tablet by mouth once daily  . predniSONE (DELTASONE) 5 MG tablet Alternate 10 mg one day, 5 mg next,  etc  . SENSIPAR 30 MG tablet Take 30 mg by mouth at bedtime. Reported on 04/29/2016  . sevelamer carbonate (RENVELA) 800 MG tablet Take 800 mg by mouth 3 (three) times daily. And 2 tablet with snacks  . TRESIBA FLEXTOUCH 100 UNIT/ML SOPN FlexTouch Pen inject 40 units subcutaneously once daily  . valsartan (DIOVAN) 80 MG tablet Take 80 mg by mouth at bedtime.   . verapamil (CALAN-SR) 120 MG CR tablet Take 1 tablet (120 mg total) by mouth at bedtime.   No facility-administered encounter medications on file as of 11/04/2016.     Follow-up: No Follow-up on file.  Everlene OtherJayce Kaycen Whitworth DO [facility] [place]

## 2016-11-04 NOTE — Progress Notes (Signed)
Pre visit review using our clinic review tool, if applicable. No additional management support is needed unless otherwise documented below in the visit note. 

## 2016-11-04 NOTE — Telephone Encounter (Signed)
Refilled 06/03/16. Pt last seen 08/12/16. Please advise?

## 2016-11-04 NOTE — Assessment & Plan Note (Signed)
At goal on lipitor. Continue. 

## 2016-11-04 NOTE — Assessment & Plan Note (Signed)
Compliant with BiPAP

## 2016-11-04 NOTE — Assessment & Plan Note (Signed)
Remains on prednisone. Pulmonary is continuing to try to taper. This is a big issue for him as his pulmonary status limits exercise. Prednisone use has led to cushingoid changes and increasing obesity which also impacts his diabetes and respiratory problems. Offered referral to tertiary care facility and he declined. Continue taper of prednisone per pulmonary.

## 2016-11-04 NOTE — Assessment & Plan Note (Signed)
Decent control given her chronic prednisone use. Continue insulin regimen per endo.

## 2016-11-04 NOTE — Progress Notes (Signed)
Subjective:  Patient ID: Ryan Arnold, male    DOB: 10/31/1960  Age: 56 y.o. MRN: [id number]  CC: Follow up  HPI:  56 year old male with extensive past medical history including type 1 diabetes, ESRD on hemodialysis, morbid obesity, OSA, hypertension, hyperlipidemia, BOOP, CHF, Chronic respiratory failure presents for follow up.  HTN  BP elevated today.  Currently on Metoprolol, Verapamil and Valsartan.  Followed by Cards and Renal.  DM-1  A1C with reasonable control (7.5%) given chronic steroid use.  Follows with Endo.  OSA  Compliant with BiPAP.  HLD  Well controlled on Lipitor.  BOOP, Chronic respiratory failure  Big struggle at this time.  BOOP controlled on prednisone but has led to complications.  On chronic O2.  Stable but no significant improvement.  Social Hx   Social History   Social History  . Marital status: Married    Spouse name: N/A  . Number of children: N/A  . Years of education: N/A   Social History Main Topics  . Smoking status: Never Smoker  . Smokeless tobacco: Never Used  . Alcohol use No  . Drug use: No  . Sexual activity: No   Other Topics Concern  . None   Social History Narrative   Retired Emergency planning/management officerolice Officer.  Does not routinely exercise.   Review of Systems  Constitutional: Positive for fatigue.  Respiratory: Positive for shortness of breath.   Cardiovascular: Negative for chest pain.   Objective:  BP (!) 157/82   Pulse 74   Temp 98.5 F (36.9 C) (Oral)   Resp 16   Wt (!) 319 lb 12.8 oz (145.1 kg)   SpO2 94%   BMI 42.19 kg/m   BP/Weight 11/04/2016 10/28/2016 10/09/2016  Systolic BP 157 138 140  Diastolic BP 82 70 72  Wt. (Lbs) 319.8 318.4 317  BMI 42.19 42.01 41.82   Physical Exam  Constitutional: He is oriented to person, place, and time. No distress.  Cardiovascular: Normal rate and regular rhythm.   2+ LE edema.  Pulmonary/Chest:  Decreased breath sounds; no adventitious sounds.    Neurological: He  is alert and oriented to person, place, and time.  Psychiatric: He has a normal mood and affect.  Vitals reviewed.  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 Continue insulin regimen per endo.      OSA (obstructive sleep apnea)    Compliant with BiPAP.      Hyperlipidemia    At goal on lipitor. Continue.      Essential hypertension    BP elevated today.  Continue current meds. Defer increase/change to Renal/Cards (to prevent hypotension with HD).  BOOP (bronchiolitis obliterans with organizing pneumonia) (HCC)    Remains on prednisone. Pulmonary is continuing to try to taper. This is a big issue for him as his pulmonary status limits exercise. Prednisone use has led to cushingoid changes and increasing obesity which also impacts his diabetes and respiratory problems. Offered referral to tertiary care facility and he declined. Continue taper of prednisone per pulmonary.        Follow-up: 6 months to 1 year  Everlene Other DO [facility]

## 2016-11-04 NOTE — Assessment & Plan Note (Signed)
BP elevated today.  Continue current meds. Defer increase/change to Renal/Cards (to prevent hypotension with HD).

## 2016-11-04 NOTE — Patient Instructions (Signed)
Continue your meds.  Follow up closely with your specialists.   Follow up in 6 months to 1 year  Take care  Dr. Adriana Simasook

## 2016-11-06 ENCOUNTER — Other Ambulatory Visit (INDEPENDENT_AMBULATORY_CARE_PROVIDER_SITE_OTHER): Payer: Self-pay | Admitting: Vascular Surgery

## 2016-11-06 ENCOUNTER — Ambulatory Visit (INDEPENDENT_AMBULATORY_CARE_PROVIDER_SITE_OTHER): Payer: Medicare Other | Admitting: Vascular Surgery

## 2016-11-06 ENCOUNTER — Encounter (INDEPENDENT_AMBULATORY_CARE_PROVIDER_SITE_OTHER): Payer: Self-pay | Admitting: Vascular Surgery

## 2016-11-06 ENCOUNTER — Ambulatory Visit (INDEPENDENT_AMBULATORY_CARE_PROVIDER_SITE_OTHER): Payer: Medicare Other

## 2016-11-06 VITALS — BP 134/71 | HR 73 | Resp 16 | Ht 72.0 in | Wt 317.0 lb

## 2016-11-06 DIAGNOSIS — N186 End stage renal disease: Secondary | ICD-10-CM

## 2016-11-06 DIAGNOSIS — I10 Essential (primary) hypertension: Secondary | ICD-10-CM | POA: Diagnosis not present

## 2016-11-06 DIAGNOSIS — E785 Hyperlipidemia, unspecified: Secondary | ICD-10-CM

## 2016-11-06 DIAGNOSIS — I872 Venous insufficiency (chronic) (peripheral): Secondary | ICD-10-CM

## 2016-11-06 DIAGNOSIS — Z992 Dependence on renal dialysis: Secondary | ICD-10-CM

## 2016-11-06 DIAGNOSIS — E108 Type 1 diabetes mellitus with unspecified complications: Secondary | ICD-10-CM | POA: Diagnosis not present

## 2016-11-06 DIAGNOSIS — I890 Lymphedema, not elsewhere classified: Secondary | ICD-10-CM

## 2016-11-06 NOTE — Progress Notes (Signed)
MRN : [id number]  Ryan Arnold is a 56 y.o. ([birth year]) male who presents with chief complaint of  Chief Complaint  Patient presents with  . Follow-up  .  History of Present Illness: The patient returns to the office for followup of their dialysis access. The function of the access has been stable. The patient denies increased bleeding time or increased recirculation. Patient denies difficulty with cannulation. The patient denies hand pain or other symptoms consistent with steal phenomena.  No significant arm swelling.  The patient denies redness or swelling at the access site. The patient denies fever or chills at home or while on dialysis.  Patient is also seen for worsening leg swelling. The patient first noticed the swelling remotely but is now concerned because of a significant increase in the overall edema. The swelling is associated with pain and discoloration. The patient notes that in the morning the legs are significantly improved but they steadily worsened throughout the course of the day. Elevation makes the legs better, dependency makes them much worse.   There is no history of ulcerations associated with the swelling.   The patient denies any recent changes in their medications.  The patient has not been wearing graduated compression.  The patient has no had any past angiography, interventions or vascular surgery.  The patient denies a history of DVT or PE. There is no prior history of phlebitis. There is no history of primary lymphedema.  There is no history of radiation treatment to the groin or pelvis No history of malignancies. No history of trauma or groin or pelvic surgery. No history of foreign travel or parasitic infections area   The patient denies amaurosis fugax or recent TIA symptoms. There are no recent neurological changes noted. The patient denies claudication symptoms or rest pain symptoms. The patient denies history of DVT, PE or superficial  thrombophlebitis. The patient denies recent episodes of angina or shortness of breath.    Current Meds  Medication Sig  . albuterol (PROVENTIL HFA) 108 (90 Base) MCG/ACT inhaler Inhale 2 puffs into the lungs every 6 (six) hours as needed for wheezing or shortness of breath.  [name] aspirin EC 81 MG tablet Take 81 mg by mouth daily.  [name] atorvastatin (LIPITOR) 40 MG tablet Take 40 mg by mouth daily.  . B-D ULTRAFINE III SHORT PEN 31G X 8 MM MISC use as directed  . DULoxetine (CYMBALTA) 60 MG capsule take 2 capsules by mouth once daily  . furosemide (LASIX) 80 MG tablet Take 1 tablet (80 mg total) by mouth daily.  [name] gabapentin (NEURONTIN) 300 MG capsule take 1 capsule by mouth three times a day  . metoprolol (LOPRESSOR) 50 MG tablet Take 50 mg by mouth 2 (two) times daily.  . multivitamin (RENA-VIT) TABS tablet Take 1 tablet by mouth daily.  [name] NOVOLOG FLEXPEN 100 UNIT/ML FlexPen inject 35 units subcutaneously three times a day with food  . ONE TOUCH ULTRA TEST test strip TEST BLOOD SUGAR four times a day  . pantoprazole (PROTONIX) 40 MG tablet take 1 tablet by mouth once daily  . predniSONE (DELTASONE) 5 MG tablet Alternate 10 mg one day, 5 mg next, etc  . SENSIPAR 30 MG tablet Take 30 mg by mouth at bedtime. Reported on 04/29/2016  . sevelamer carbonate (RENVELA) 800 MG tablet Take 800 mg by mouth 3 (three) times daily. And 2 tablet with snacks  . TRESIBA FLEXTOUCH 100 UNIT/ML SOPN FlexTouch Pen inject 40 units subcutaneously once daily  .  valsartan (DIOVAN) 80 MG tablet Take 80 mg by mouth at bedtime.   . verapamil (CALAN-SR) 120 MG CR tablet Take 1 tablet (120 mg total) by mouth at bedtime.    Past Medical History:  Diagnosis Date  . Anemia of chronic disease   . BOOP (bronchiolitis obliterans with organizing pneumonia) (HCC)    a. 01/2018 s/p R thoracoscopy/Bx confirming BOOP;  b. 02/2018 high dose steroids started.  . Chest pain    a. 06/2014 Myoview ([facility]) no ischemia/infarct, nl EF.  [name]  Chronic diastolic CHF (congestive heart failure) (HCC)    a. 12/2014 Echo: EF 50-55%, mild conc LVH, mildly dil LA.  . End stage renal disease (HCC)    a. on dialysis; still makes urine.  . Essential hypertension   . Hyperlipemia   . Morbid obesity (HCC)   . OSA treated with BiPAP   . Recurrent pneumonia    a. 2/2 BOOP.  [name] Secondary hyperparathyroidism (HCC)   . Type 1 diabetes mellitus (HCC)     Past Surgical History:  Procedure Laterality Date  . cataract Bilateral   . GALLBLADDER SURGERY    . KNEE SURGERY Right   . LUNG BIOPSY    . SHOULDER SURGERY Left     Social History Social History  Substance Use Topics  . Smoking status: Never Smoker  . Smokeless tobacco: Never Used  . Alcohol use No    Family History Family History  Problem Relation Age of Onset  . Diabetes Mother     died @ 42.  [name] Hypertension Mother   . Hypertension Maternal Grandmother   . Cancer Brother   No family history of bleeding/clotting disorders, porphyria or autoimmune disease   Allergies  Allergen Reactions  . Oxycodone-Acetaminophen Nausea And Vomiting  . Tape Other (See Comments)    Paper tape caused blisters     REVIEW OF SYSTEMS (Negative unless checked)  Constitutional: [] Weight loss  [] Fever  [] Chills Cardiac: [] Chest pain   [] Chest pressure   [] Palpitations   [] Shortness of breath when laying flat   [] Shortness of breath with exertion. Vascular:  [] Pain in legs with walking   [x] Pain in legs with standing  [] History of DVT   [] Phlebitis   [x] Swelling in legs   [] Varicose veins   [] Non-healing ulcers Pulmonary:   [x] Uses home oxygen   [] Productive cough   [] Hemoptysis   [] Wheeze  [] COPD   [x] Asthma Neurologic:  [] Dizziness   [] Seizures   [] History of stroke   [] History of TIA  [] Aphasia   [] Vissual changes   [] Weakness or numbness in arm   [] Weakness or numbness in leg Musculoskeletal:   [] Joint swelling   [x] Joint pain   [] Low back pain Hematologic:  [] Easy bruising  [] Easy  bleeding   [] Hypercoagulable state   [] Anemic Gastrointestinal:  [] Diarrhea   [] Vomiting  [] Gastroesophageal reflux/heartburn   [] Difficulty swallowing. Genitourinary:  [] Chronic kidney disease   [] Difficult urination  [] Frequent urination   [] Blood in urine Skin:  [] Rashes   [] Ulcers  Psychological:  [] History of anxiety   []  History of major depression.  Physical Examination  Vitals:   11/06/16 1047  BP: 134/71  Pulse: 73  Resp: 16  Weight: (!) 317 lb (143.8 kg)  Height: 6' (1.829 m)   Body mass index is 42.99 kg/m. Gen: WD/WN, NAD Head: [place]/AT, moon facies,  No temporalis wasting.  Ear/Nose/Throat: Hearing grossly intact, nares w/o erythema or drainage, poor dentition Eyes: PER, EOMI, sclera nonicteric.  Neck: Supple, no masses.  No bruit or JVD.  Pulmonary:  Distant air movement, wheezes by auscultation bilaterally, no use of accessory muscles.  Cardiac: RRR, normal S1, S2, no Murmurs. Vascular:  3+ pitting edema bilaterally, left arm brachial cephalic fistula with moderate pulsatility good thrill and good bruit Vessel Right Left  Radial Palpable Palpable  Ulnar Palpable Palpable  Brachial Palpable Palpable  Carotid Palpable Palpable  Femoral Palpable Palpable  Popliteal Palpable Palpable  PT Trace Palpable Trace Palpable  DP Palpable Palpable   Gastrointestinal: soft, non-distended. No guarding/no peritoneal signs.  Musculoskeletal: M/S 5/5 throughout.  No deformity or atrophy.  Neurologic: CN 2-12 intact. Pain and light touch intact in extremities.  Symmetrical.  Speech is fluent. Motor exam as listed above. Psychiatric: Judgment intact, Mood & affect appropriate for pt's clinical situation. Dermatologic: No rashes or ulcers noted.  No changes consistent with cellulitis. Lymph : No Cervical lymphadenopathy, no lichenification or skin changes of chronic lymphedema.  CBC Lab Results  Component Value Date   WBC 12.6 (H) 09/08/2016   HGB 11.0 (L) 09/08/2016   HCT 34.9  (L) 09/08/2016   MCV 93.7 09/08/2016   PLT 357 09/08/2016    BMET    Component Value Date/Time   NA 136 09/10/2016 0625   NA 133 (L) 02/05/2015 0936   K 4.7 09/10/2016 0625   K 4.7 02/05/2015 0936   CL 94 (L) 09/10/2016 0625   CL 91 (L) 02/05/2015 0936   CO2 28 09/10/2016 0625   CO2 28 02/05/2015 0936   GLUCOSE 246 (H) 09/10/2016 0625   GLUCOSE 173 (H) 02/05/2015 0936   BUN 73 (H) 09/10/2016 0625   BUN 59 (H) 02/05/2015 0936   CREATININE 9.11 (H) 09/10/2016 0625   CREATININE 10.85 (H) 02/05/2015 0936   CALCIUM 6.9 (L) 09/10/2016 0625   CALCIUM 8.4 (L) 02/05/2015 0936   GFRNONAA 6 (L) 09/10/2016 0625   GFRNONAA 5 (L) 02/05/2015 0936   GFRAA 7 (L) 09/10/2016 0625   GFRAA 6 (L) 02/05/2015 0936   CrCl cannot be calculated (Patient's most recent lab result is older than the maximum 21 days allowed.).  COAG Lab Results  Component Value Date   INR 0.9 01/30/2015   INR 1.1 12/20/2013    Radiology No results found.  Assessment/Plan 1. CKD (chronic kidney disease) stage V requiring chronic dialysis (HCC) Recommend:  The patient is doing well and currently has adequate dialysis access. The patient's dialysis center is not reporting any access issues. Flow pattern is stable when compared to the prior ultrasound.  The patient should have a duplex ultrasound of the dialysis access in 3 months. The patient will follow-up with me in the office after each ultrasound   - VAS US DUPLEX DIALYSIS ACCESS (AVF,AVG); Future  2. Chronic venous insufficiency Recommend:  No surgery or intervention at this point in time.    I have reviewed my previous discussion with the patient regarding swelling and why it causes symptoms.  Patient will continue wearing graduated compression stockings class 1 (20-30 mmHg) on a daily basis. The patient will  beginning wearing the stockings first thing in the morning and removing them in the evening. The patient is instructed specifically not to sleep  in the stockings.    In addition, behavioral modification including several periods of elevation of the lower extremities during the day will be continued.  This was reviewed with the patient during the initial visit.  The patient will also continue routine exercise, especially walking on a daily basis as  was discussed during the initial visit.    Despite conservative treatments including graduated compression therapy class 1 and behavioral modification including exercise and elevation the patient  has not obtained adequate control of the lymphedema.  The patient still has stage 3 lymphedema and therefore, I believe that a lymph pump should be added to improve the control of the patient's lymphedema.  Additionally, a lymph pump is warranted because it will reduce the risk of cellulitis and ulceration in the future.  Patient should follow-up in six months   3. Lymphedema See #2  4. Essential hypertension Continue antihypertensive medications as already ordered, these medications have been reviewed and there are no changes at this time.   5. Hyperlipidemia, unspecified hyperlipidemia type Continue statin as ordered and reviewed, no changes at this time   6. 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  Hgb A1C to be monitored as already arranged by primary service   Levora DredgeGregory Schnier, MD  11/06/2016 11:41 AM

## 2016-11-11 ENCOUNTER — Encounter: Payer: Self-pay | Admitting: Cardiovascular Disease

## 2016-11-11 ENCOUNTER — Ambulatory Visit (INDEPENDENT_AMBULATORY_CARE_PROVIDER_SITE_OTHER): Payer: Medicare Other | Admitting: Cardiovascular Disease

## 2016-11-11 VITALS — BP 138/82 | HR 80 | Ht 73.0 in | Wt 325.0 lb

## 2016-11-11 DIAGNOSIS — I5032 Chronic diastolic (congestive) heart failure: Secondary | ICD-10-CM | POA: Diagnosis not present

## 2016-11-11 DIAGNOSIS — N186 End stage renal disease: Secondary | ICD-10-CM

## 2016-11-11 DIAGNOSIS — R0602 Shortness of breath: Secondary | ICD-10-CM

## 2016-11-11 DIAGNOSIS — E108 Type 1 diabetes mellitus with unspecified complications: Secondary | ICD-10-CM

## 2016-11-11 DIAGNOSIS — E782 Mixed hyperlipidemia: Secondary | ICD-10-CM

## 2016-11-11 DIAGNOSIS — Z992 Dependence on renal dialysis: Secondary | ICD-10-CM

## 2016-11-11 DIAGNOSIS — I493 Ventricular premature depolarization: Secondary | ICD-10-CM | POA: Diagnosis not present

## 2016-11-11 DIAGNOSIS — I10 Essential (primary) hypertension: Secondary | ICD-10-CM

## 2016-11-11 MED ORDER — TORSEMIDE 20 MG PO TABS: 40.0000 mg | ORAL_TABLET | Freq: Two times a day (BID) | ORAL | 6 refills | Status: AC

## 2016-11-11 NOTE — Progress Notes (Signed)
Cardiology Office Note  Date:  11/11/2016   ID:  Ryan Arnold, Ryan Arnold [birth year], MRN [id number]  PCP:  Tommie Sams, DO   Chief Complaint  Patient presents with  . other    1 month follow up Patient states he is still swelling but other than that he is going well. Meds reviewed verbally with patient.     HPI:  56 year old African-American male with morbid obesity, Boop, on chronic prednisone,Nasal cannula oxygen, poorly controlled type 2 diabetes, end-stage renal disease on hemodialysis, hypertension, hyperlipidemia, who presents for follow-up of prior episodes of chest pain and chronic diastolic CHF history of sleep apnea, on BiPAP, managed by pulmonary   In follow-up he reports that his shortness of breath on exertion is "bad" Coming down on the dose of the prednisone "not working" Continues on Lasix 80 mg in the morning, 40 mg in the p.m. Makes urine, not much Still with leg swelling  Was in hospital 08/2016: Hospital records reviewed Admitted with Acute on chronic hypoxic respiratory failure which is multifactorial due to volume overload, hypertensive urgency, end-stage renal disease,  BOOP  CT ordered to evaluate for PE was negative however does show right pleural effusion.  Patient underwent thoracentesis. Had aggressive diuresis  Since his discharge, weight is up 8 pounds, most notably in the past month Weight is 325 pounds on today's visit, reports it is typically 317 pounds. He does not remember what his weight is at home, reports it is typically in kilograms. Does not weigh himself on a regular basis He does not know what his dry weight is Reports that sometimes dialysis will pulled 2-3 kg, reports that once recently they had 6 L to pull  rare atypical type chest pain Reports his balance is poor, uses a cane,  Other past medical history He was evaluated before at [facility] with a pharmacologic nuclear stress test in September 2015 which showed no evidence of ischemia  with normal ejection fraction.   Echocardiogram in March showed normal LV systolic function with mild left ventricular hypertrophy.  He had a sleep study done also recently which showed evidence of severe sleep apnea with a 4 beat run of wide-complex tachycardia.   PMH:   has a past medical history of Anemia of chronic disease; BOOP (bronchiolitis obliterans with organizing pneumonia) (HCC); Chest pain; Chronic diastolic CHF (congestive heart failure) (HCC); End stage renal disease (HCC); Essential hypertension; Hyperlipemia; Morbid obesity (HCC); OSA treated with BiPAP; Recurrent pneumonia; Secondary hyperparathyroidism (HCC); and Type 1 diabetes mellitus (HCC).  PSH:    Past Surgical History:  Procedure Laterality Date  . cataract Bilateral   . GALLBLADDER SURGERY    . KNEE SURGERY Right   . LUNG BIOPSY    . SHOULDER SURGERY Left     Current Outpatient Prescriptions  Medication Sig Dispense Refill  . albuterol (PROVENTIL HFA) 108 (90 Base) MCG/ACT inhaler Inhale 2 puffs into the lungs every 6 (six) hours as needed for wheezing or shortness of breath. 1 Inhaler 0  . aspirin EC 81 MG tablet Take 81 mg by mouth daily.    [name] atorvastatin (LIPITOR) 40 MG tablet Take 40 mg by mouth daily.    . B-D ULTRAFINE III SHORT PEN 31G X 8 MM MISC use as directed 100 each 11  . DULoxetine (CYMBALTA) 60 MG capsule take 2 capsules by mouth once daily 90 capsule 2  . furosemide (LASIX) 40 MG tablet Take 1 tablet (40 mg) by mouth once daily in the afternoon.  90 tablet 3  . furosemide (LASIX) 80 MG tablet Take 1 tablet (80 mg total) by mouth daily. 30 tablet 10  . gabapentin (NEURONTIN) 300 MG capsule take 1 capsule by mouth three times a day 90 capsule 1  . metoprolol (LOPRESSOR) 50 MG tablet Take 50 mg by mouth 2 (two) times daily.    . multivitamin (RENA-VIT) TABS tablet Take 1 tablet by mouth daily.    [name]. NOVOLOG FLEXPEN 100 UNIT/ML FlexPen inject 35 units subcutaneously three times a day with food 30  mL 1  . ONE TOUCH ULTRA TEST test strip TEST BLOOD SUGAR four times a day 100 each 3  . pantoprazole (PROTONIX) 40 MG tablet take 1 tablet by mouth once daily 90 tablet 3  . predniSONE (DELTASONE) 5 MG tablet Alternate 10 mg one day, 5 mg next, etc 50 tablet 10  . SENSIPAR 30 MG tablet Take 30 mg by mouth at bedtime. Reported on 04/29/2016    . sevelamer carbonate (RENVELA) 800 MG tablet Take 800 mg by mouth 3 (three) times daily. And 2 tablet with snacks    . TRESIBA FLEXTOUCH 100 UNIT/ML SOPN FlexTouch Pen inject 40 units subcutaneously once daily 15 mL 2  . valsartan (DIOVAN) 80 MG tablet Take 80 mg by mouth at bedtime.   0  . verapamil (CALAN-SR) 120 MG CR tablet Take 1 tablet (120 mg total) by mouth at bedtime. 90 tablet 1   No current facility-administered medications for this visit.      Allergies:   Oxycodone-acetaminophen and Tape   Social History:  The patient  reports that he has never smoked. He has never used smokeless tobacco. He reports that he does not drink alcohol or use drugs.   Family History:   family history includes Cancer in his brother; Diabetes in his mother; Hypertension in his maternal grandmother and mother.    Review of Systems: Review of Systems  Constitutional: Negative.   Respiratory: Positive for shortness of breath.   Cardiovascular: Positive for leg swelling.  Gastrointestinal: Negative.   Musculoskeletal: Negative.        Gait instability  Neurological: Negative.   Psychiatric/Behavioral: Negative.   All other systems reviewed and are negative.    PHYSICAL EXAM: VS:  BP 138/82 (BP Location: Right Arm, Patient Position: Sitting, Cuff Size: Large)   Pulse 80   Ht 6\' 1"  (1.854 m)   Wt (!) 325 lb (147.4 kg)   BMI 42.88 kg/m  , BMI Body mass index is 42.88 kg/m. GEN: Well nourished, well developed, in no acute distress , obese, on nasal cannula oxygen HEENT: normal  Neck: no JVD, carotid bruits, or masses Cardiac: RRR; no murmurs, rubs, or  gallops, trace pitting lower extremity edema bilaterally Respiratory:  clear to auscultation bilaterally, normal work of breathing GI: soft, nontender, nondistended, + BS MS: no deformity or atrophy  Skin: warm and dry, no rash Neuro:  Strength and sensation are intact Psych: euthymic mood, full affect    Recent Labs: 08/28/2016: ALT 32 08/29/2016: TSH 4.693 09/07/2016: B Natriuretic Peptide 1,690.0 09/08/2016: Hemoglobin 11.0; Platelets 357 09/10/2016: BUN 73; Creatinine, Ser 9.11; Potassium 4.7; Sodium 136    Lipid Panel Lab Results  Component Value Date   CHOL 112 07/31/2016   HDL 50.40 07/31/2016   LDLCALC 29 07/31/2016   TRIG 164.0 (H) 07/31/2016      Wt Readings from Last 3 Encounters:  11/11/16 (!) 325 lb (147.4 kg)  11/06/16 (!) 317 lb (143.8 kg)  11/04/16 (!) 319 lb 12.8 oz (145.1 kg)       ASSESSMENT AND PLAN:  Chronic diastolic CHF (congestive heart failure) (HCC) Recent weight gain, worsening shortness of breath symptoms Recommended he discuss this with Dr. Cherylann Ratel. Recommended we try to change improved Lasix to torsemide 40 mill grams twice a day. If he continues to have weight gain, CHF symptoms, may need to decrease his dry weight. Long discussion concerning his fluid intake. He does not weigh himself at home. Recommended he start to weigh himself, may need to adjust his diuretics based on his weight. If he continues to have trouble, he may benefit from metolazone as needed  Essential hypertension Blood pressure is well controlled on today's visit. No changes made to the medications.  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 Cherylann Ratel  Morbid obesity [facility]) We have  encouraged continued exercise, careful diet management in an effort to lose weight.  SOB (shortness of breath) Likely multifactorial including CHF, underlying lung disease/BOOP, morbid obesity, recommended he participate in pulmonary rehabilitation/respiratory therapy services He is grossly deconditioned and without assistance will likely deteriorate further     Total encounter time more than 25 minutes  Greater than 50% was spent in counseling and coordination of care with the patient  Disposition:   F/U  6 months  No orders of the defined types were placed in this encounter.    Signed, Dossie Arbour, M.D., Ph.D. 11/11/2016  [facility] [place], [place] [phone]

## 2016-11-11 NOTE — Patient Instructions (Addendum)
Medication Instructions:   Please hold the lasix/furosemide Start torsemide 40 mg twice a day (2 pills twice a day) If you get dehydrated, decrease back to 2 in the Am and one in the pm  Labwork:  No new labs needed  Testing/Procedures:  No further testing at this time  We will place a referral for pulmonary rehab   I recommend watching educational videos on topics of interest to you at:       [email or web address]  Enter code: [email or web address]    Follow-Up: It was a pleasure seeing you in the office today. Please call us if you have new issues that need to be addressed before your next appt.  [phone]  Your physician wants you to follow-up in: 6 months.  You will receive a reminder letter in the mail two months in advance. If you don't receive a letter, please call our office to schedule the follow-up appointment.  If you need a refill on your cardiac medications before your next appointment, please call your pharmacy.

## 2016-11-11 NOTE — Progress Notes (Signed)
PULMONARY OFFICE FOLLOW UP NOTE  PROBLEMS:  Ryan Arnold is a 56 y.o. 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   DATA: CT chest 09/08/16;  No airspace disease noted, RLL atelectasis with pleural effusion. Concomitant decub film shows free flowing effusion.  CT chest 12/13/14: Patchy airspace consolidation and ground-glass involving all lobes of both lungs, likely due to pneumonia. Pulmonary hemorrhage (as can be seen with lupus) is another consideration. Congestive heart failure is considered less likely CT chest 01/08/15: Patchy areas of ground-glass opacity in both lungs, predominating in the upper lobes, right greater than left. Associated minimal left pleural effusion Lung biopsy 01/30/15: BOOP Spirometry 07/29/16: no obstruction, probable moderate restriction CT chest 09/08/16: There is small partially loculated right pleural effusion. Atelectasis or scarring is noted in right lower lobe posteriorly. Echocardiogram 09/08/16: LVEF normal, mild to mod LVH, mildly dilated LA, mild PAH   SUBJ:  He was last asked to decrease his prednisone down to 10 mg am and 5 mg pm. But he had trouble with breathing, so then he went back up to 10 mg bid.  Currently he feels that his breathing is OK at rest, but with activity or any exertion his breathing is worse.    OBJ: Vitals:   11/25/16 0847  BP: 138/88  Pulse: 76  SpO2: 95%  Weight: (!) 318 lb (144.2 kg)  O2 2 LPM [place]  97% on RA @ rest  Gen: Obese, NAD HEENT: cushingoid facies Neck: NO LAN,  JVP cannot be assess Lungs: decreased BS, no adventitious sounds Cardiovascular: Reg rate, normal rhythm, no M noted Abdomen: Soft, NT +BS Ext: severe BLE edema with chronic stasis changes Neuro: CNs intact, motor/sens grossly intact Skin: No lesions noted   DATA: No new CXR or PFTs  Current Outpatient Prescriptions on File Prior to Visit  Medication Sig Dispense Refill  . albuterol (PROVENTIL HFA) 108 (90 Base) MCG/ACT inhaler Inhale 2 puffs into the lungs every 6 (six) hours as needed for wheezing or shortness of breath. 1 Inhaler 0  . aspirin EC 81 MG tablet Take 81 mg by mouth daily.    [name] atorvastatin (LIPITOR) 40 MG tablet Take 40 mg by mouth daily.    . B-D ULTRAFINE III SHORT PEN 31G X 8 MM MISC use as directed 100 each 11  . DULoxetine (CYMBALTA) 60 MG capsule take 2 capsules by mouth once daily 90 capsule 2  . furosemide (LASIX) 40 MG tablet Take 1 tablet (40 mg) by mouth once daily in the afternoon. (Patient not taking: Reported on 11/06/2016) 90 tablet 3  . furosemide (LASIX) 80 MG tablet Take 1 tablet (80 mg total) by mouth daily. 30 tablet 10  . gabapentin (NEURONTIN) 300 MG capsule take 1 capsule by mouth three times a day 90 capsule 1  . metoprolol (LOPRESSOR) 50 MG tablet Take 50 mg by mouth 2 (two) times daily.    . multivitamin (RENA-VIT) TABS tablet Take 1 tablet by mouth daily.    [name] NOVOLOG FLEXPEN 100 UNIT/ML FlexPen inject 35 units subcutaneously three times a day with food  30 mL 1  . ONE TOUCH ULTRA TEST test strip TEST BLOOD SUGAR four times a day 100 each 3  . pantoprazole (PROTONIX) 40 MG tablet take 1 tablet by mouth once daily 90 tablet 3  . predniSONE (DELTASONE) 5 MG tablet Alternate 10 mg one day, 5 mg next, etc 50 tablet 10  . SENSIPAR 30 MG tablet Take 30 mg by mouth at bedtime. Reported on 04/29/2016    . sevelamer carbonate (RENVELA) 800 MG tablet Take 800 mg by mouth 3 (three) times daily. And 2 tablet with snacks    . TRESIBA FLEXTOUCH 100 UNIT/ML SOPN  FlexTouch Pen inject 40 units subcutaneously once daily 15 mL 2  . valsartan (DIOVAN) 80 MG tablet Take 80 mg by mouth at bedtime.   0  . verapamil (CALAN-SR) 120 MG CR tablet Take 1 tablet (120 mg total) by mouth at bedtime. 90 tablet 1   No current facility-administered medications on file prior to visit.     IMPRESSION: 1) Chronic hypoxemic respiratory failure on LTOT 2) Relapsing BOOP 3) Morbid obesity 4) Group 3 PAH (mild) 5) ESRD 6) OSA, chronic BiPAP (Followed @ [facility])   On review of his most recent Ct images the BOOP appears to have resolved.  His major respiratory problem is restrictive physiology due to sever obesity. It appears that the BOOP is in remission on prednisone 10 mg daily but he is suffering severe consequences of chronic prednisone therapy with Cushingoid changes and obesity  PLAN: We will try to taper prednisone slowly.He is asked to take 10 mg in am, and 7.5 in pm.   Wells Guileseep Media Pizzini, MD PCCM service Pager [phone] 11/25/2016

## 2016-11-17 IMAGING — CR DG CHEST 2V
1 series · 2 of 2 positions shown · non-contrast
Comparison: 03/18/2015

CLINICAL DATA: Short of breath. Leukocytosis. Chronic renal failure
with dialysis. Lung biopsy 01/30/2015 revealing bronchiolitis
obliterans with organizing pneumonia.

EXAM:
CHEST  2 VIEW

[Series 1: dg chest 2 view · 0.14mm/px · 2 of 2 slices shown]
[im 1/2]
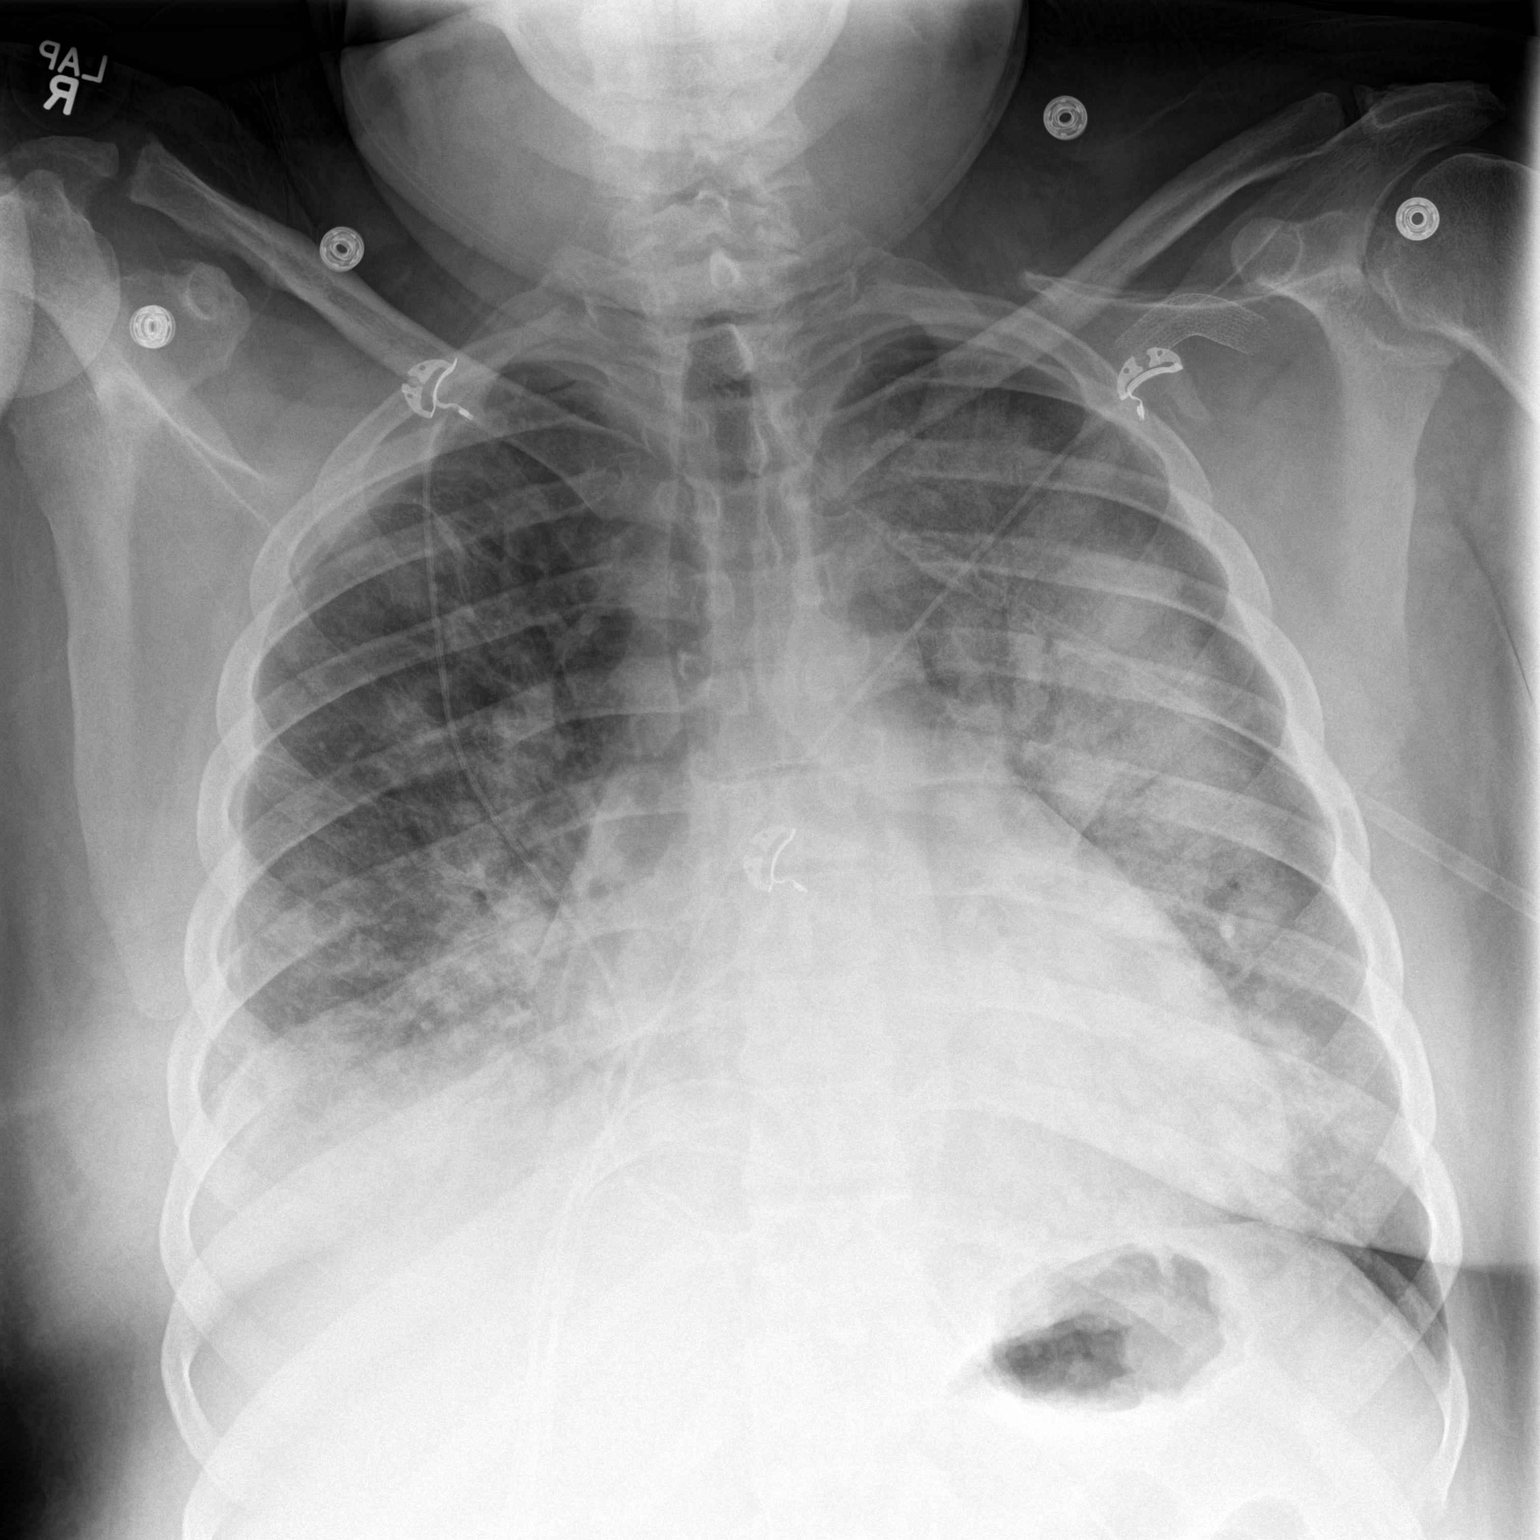
[im 2/2]
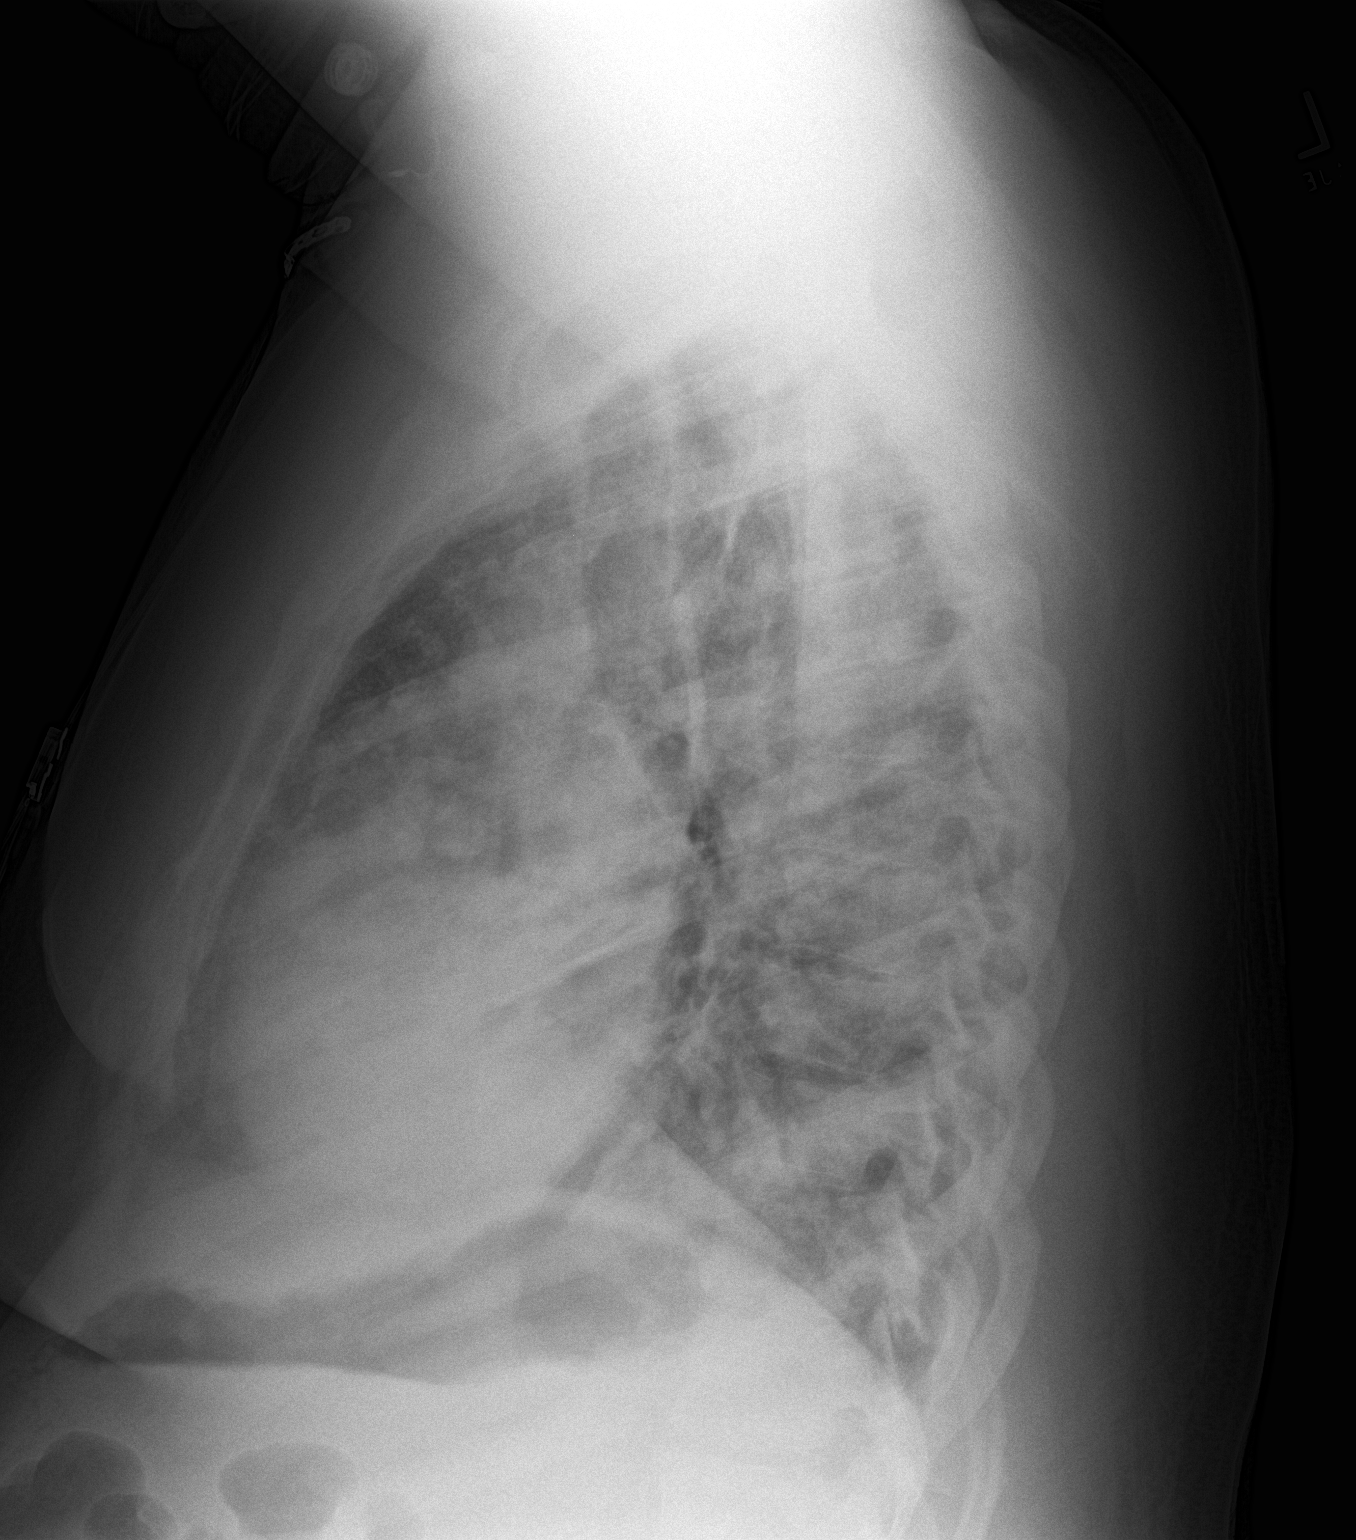

[2 of 2 positions shown; findings below may reference images not displayed]

FINDINGS: Progression of bilateral airspace disease left greater than right.
Cardiac enlargement. Right pleural effusion is small and unchanged.
No significant effusion on the left. No pneumothorax.
IMPRESSION: Progression of bilateral airspace disease left greater than right.
Differential includes pulmonary infection and pulmonary edema.
Pulmonary hemorrhage also a possibility.

## 2016-11-18 ENCOUNTER — Encounter: Payer: Self-pay | Admitting: Family

## 2016-11-25 ENCOUNTER — Ambulatory Visit (INDEPENDENT_AMBULATORY_CARE_PROVIDER_SITE_OTHER): Payer: Medicare Other | Admitting: Internal Medicine

## 2016-11-25 ENCOUNTER — Encounter: Payer: Self-pay | Admitting: Internal Medicine

## 2016-11-25 ENCOUNTER — Ambulatory Visit: Payer: BC Managed Care – PPO | Admitting: Pulmonary Disease

## 2016-11-25 VITALS — BP 138/88 | HR 76 | Wt 318.0 lb

## 2016-11-25 DIAGNOSIS — J8489 Other specified interstitial pulmonary diseases: Secondary | ICD-10-CM

## 2016-11-25 NOTE — Patient Instructions (Signed)
Decrease evening dose of prednisone to 7.5 mg, continue 10 mg during day.

## 2016-12-07 ENCOUNTER — Other Ambulatory Visit: Payer: Self-pay | Admitting: Family Medicine

## 2016-12-07 ENCOUNTER — Other Ambulatory Visit: Payer: Self-pay | Admitting: Endocrinology

## 2016-12-17 ENCOUNTER — Emergency Department
Admission: EM | Admit: 2016-12-17 | Discharge: 2017-01-11 | Disposition: E | Payer: Medicare Other | Attending: Emergency Medicine | Admitting: Emergency Medicine

## 2016-12-17 DIAGNOSIS — N185 Chronic kidney disease, stage 5: Secondary | ICD-10-CM | POA: Insufficient documentation

## 2016-12-17 DIAGNOSIS — Z7982 Long term (current) use of aspirin: Secondary | ICD-10-CM | POA: Insufficient documentation

## 2016-12-17 DIAGNOSIS — E1022 Type 1 diabetes mellitus with diabetic chronic kidney disease: Secondary | ICD-10-CM | POA: Insufficient documentation

## 2016-12-17 DIAGNOSIS — I120 Hypertensive chronic kidney disease with stage 5 chronic kidney disease or end stage renal disease: Secondary | ICD-10-CM | POA: Diagnosis not present

## 2016-12-17 DIAGNOSIS — Z79899 Other long term (current) drug therapy: Secondary | ICD-10-CM | POA: Insufficient documentation

## 2016-12-17 DIAGNOSIS — I469 Cardiac arrest, cause unspecified: Secondary | ICD-10-CM | POA: Diagnosis present

## 2016-12-18 LAB — GLUCOSE, CAPILLARY: GLUCOSE-CAPILLARY: 166 mg/dL — AB (ref 65–99)

## 2016-12-18 MED ORDER — SUCCINYLCHOLINE CHLORIDE 20 MG/ML IJ SOLN
INTRAMUSCULAR | Status: AC | PRN
  Administered 2016-12-18: 250 mg via INTRAVENOUS

## 2016-12-18 MED ORDER — CALCIUM GLUCONATE 10 % IV SOLN
INTRAVENOUS | Status: AC
  Administered 2016-12-18: 1 g via INTRAVENOUS
  Filled 2016-12-18: qty 10

## 2016-12-18 MED ORDER — SODIUM BICARBONATE 8.4 % IV SOLN
INTRAVENOUS | Status: AC | PRN
  Administered 2016-12-17: 50 meq via INTRAVENOUS

## 2016-12-18 MED ORDER — ETOMIDATE 2 MG/ML IV SOLN
INTRAVENOUS | Status: AC | PRN
  Administered 2016-12-17: 20 mg via INTRAVENOUS

## 2016-12-18 MED ORDER — EPINEPHRINE PF 1 MG/10ML IJ SOSY
PREFILLED_SYRINGE | INTRAMUSCULAR | Status: AC | PRN
  Administered 2016-12-17 – 2016-12-18 (×8): 1 mg via INTRAVENOUS

## 2016-12-18 MED ORDER — SODIUM CHLORIDE 0.9 % IV SOLN
INTRAVENOUS | Status: AC | PRN
  Administered 2016-12-17: 1000 mL via INTRAVENOUS

## 2016-12-18 MED ORDER — CALCIUM GLUCONATE 10 % IV SOLN
1.0000 g | Freq: Once | INTRAVENOUS | Status: AC
  Administered 2016-12-18: 1 g via INTRAVENOUS

## 2016-12-18 MED ORDER — ATROPINE SULFATE 1 MG/ML IJ SOLN
INTRAMUSCULAR | Status: AC | PRN
  Administered 2016-12-17 – 2016-12-18 (×3): 1 mg via INTRAVENOUS

## 2016-12-18 MED ORDER — MAGNESIUM SULFATE 50 % IJ SOLN
INTRAMUSCULAR | Status: AC | PRN
  Administered 2016-12-17: 2 g via INTRAVENOUS

## 2016-12-18 MED FILL — Medication: Qty: 1 | Status: AC

## 2017-01-11 DIAGNOSIS — 419620001 Death: Secondary | SNOMED CT | POA: Insufficient documentation

## 2017-01-11 NOTE — Code Documentation (Signed)
Pulse check performed at this time.  No femoral pulse noted.  PEA on monitor.  Compressions resumed at this time.

## 2017-01-11 NOTE — Code Documentation (Signed)
Pulse check performed, no femoral pulse noted.  PEA on monitor at this time. Compressions resumed at this time.

## 2017-01-11 NOTE — ED Notes (Signed)
Family in and out of patient's room at this time.  Chaplain accompanying family.  Family grieving appropriately at this time.

## 2017-01-11 NOTE — Progress Notes (Signed)
   07-12-17 0100  Clinical Encounter Type  Visited With Family  Visit Type ED;Death  Referral From Nurse;Family  Spiritual Encounters  Spiritual Needs Emotional;Grief support  CH remained with family after pt death to liaison and escort family to bedside post pt death; CH allowed for family to have privacy and offered support as needed per pt wife; CH offered grief, emotional and spiritual support to large immediate and extended family. 1:39 AM Ryan Arnold

## 2017-01-11 NOTE — Code Documentation (Signed)
CPR paused and pulse check done at this time.  No pulse, pt PEA on heart monitor.

## 2017-01-11 NOTE — Code Documentation (Signed)
CPR resumed at this time  

## 2017-01-11 NOTE — Code Documentation (Signed)
Compressions resumed at this time.

## 2017-01-11 NOTE — ED Notes (Signed)
CH paged to offer support to family for post-CPR death; at this time, family prefers privacy and not requiring CH support, which was offered; [facility] available as needed; pt daughters en route and Spiritual support is available if requested by family. Erline LevineMichael I Arrow Emmerich 12:33 AM

## 2017-01-11 NOTE — Code Documentation (Signed)
Pt intubated with 7 1/2, 26 at the lip by Dr. Dolores FrameSung.

## 2017-01-11 NOTE — ED Notes (Signed)
Pt arrived to room via wheelchair from triage.  Per triage nurse Janus MolderVanessa Ashley, RN pt came in with wife c/o flu like symptoms.  Pt HD patient and had HD today.  Pt having N/V.  Upon arriving to room pt unresponsive and not breathing.  Pt placed onto stretcher, EDP in room with staff.  Code started at this time.

## 2017-01-11 NOTE — ED Provider Notes (Signed)
[facility] Emergency Department Provider Note   ____________________________________________   First MD Initiated Contact with Patient 2017/01/02 0031     (approximate)  I have reviewed the triage vital signs and the nursing notes.   HISTORY  Chief Complaint Flu like symptoms, chest and, shortness of breath, nausea/vomiting  Patient unresponsive; history obtained via spouse  HPI Ryan Arnold is a 56 y.o. male with a complex medical history of chronic diastolic CHF/pulmonary hypertension, ESRD on HD (MWF), chronic respiratory failure on home oxygen 2/2 BOOP diagnosed by biopsy in 01/2015, DM, hypertensive heart disease, HLD, morbid obesity, OSA on BiPAP, chronic LE edema noncompliant with compression hosebrought to the ED by his wife with a chief complaint of flulike symptoms, vomiting, generalized weakness and shortness of breath. Spouse reports patient was dialyzed per usual yesterday. In the evening patient began to have flulike symptoms of cough/congestion, shortness of breath, right-sided chest pain, generalized weakness, nausea and vomiting. Spouse drove the patient to the emergency department where he was immediately taken by wheelchair from the car to a treatment room. Per triage nurse, patient was initially talking but became unresponsive on the way to the treatment room. He was immediately placed into the nearest treatment room and found to be pulseless without spontaneous respiration.   Past Medical History:  Diagnosis Date  . Anemia of chronic disease   . BOOP (bronchiolitis obliterans with organizing pneumonia) (HCC)    a. 01/2018 s/p R thoracoscopy/Bx confirming BOOP;  b. 02/2018 high dose steroids started.  . Chest pain    a. 06/2014 Myoview ([facility]) no ischemia/infarct, nl EF.  [name] Chronic diastolic CHF (congestive heart failure) (HCC)    a. 12/2014 Echo: EF 50-55%, mild conc LVH, mildly dil LA.  . End stage renal disease (HCC)    a. on dialysis;  still makes urine.  . Essential hypertension   . Hyperlipemia   . Morbid obesity (HCC)   . OSA treated with BiPAP   . Recurrent pneumonia    a. 2/2 BOOP.  [name] Secondary hyperparathyroidism (HCC)   . Type 1 diabetes mellitus [facility])     Patient Active Problem List   Diagnosis Date Noted  . Chronic venous insufficiency 11/06/2016  . Lymphedema 11/06/2016  . SOB (shortness of breath) 08/29/2016  . Trigger finger, acquired 08/12/2016  . Impotence 06/05/2015  . Preventative health care 06/05/2015  . PVC's (premature ventricular contractions) 04/23/2015  . Anemia of chronic disease   . Secondary hyperparathyroidism (HCC)   . Morbid obesity (HCC)   . Chronic diastolic CHF (congestive heart failure) (HCC)   . Essential hypertension   . 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 Proliferative retinopathy 03/15/2015  . CKD (chronic kidney disease) stage V requiring chronic dialysis (HCC) 03/15/2015  . Hyperlipidemia 03/15/2015  . BOOP (bronchiolitis obliterans with organizing pneumonia) (HCC) 03/01/2015  . OSA (obstructive sleep apnea) 01/18/2015    Past Surgical History:  Procedure Laterality Date  . cataract Bilateral   . GALLBLADDER SURGERY    . KNEE SURGERY Right   . LUNG BIOPSY    . SHOULDER SURGERY Left     Prior to Admission medications   Medication Sig Start Date End Date Taking? Authorizing Provider  albuterol (PROVENTIL HFA) 108 (90 Base) MCG/ACT inhaler Inhale 2 puffs into the lungs every 6 (six) hours as needed for wheezing or shortness of breath. 10/28/16   Tommie Sams, DO  aspirin EC 81 MG tablet Take 81 mg by  mouth daily.    Historical Provider, MD  atorvastatin (LIPITOR) 40 MG tablet Take 40 mg by mouth daily. 06/03/16   Historical Provider, MD  atorvastatin (LIPITOR) 40 MG tablet take 1 tablet by mouth once daily 12/08/16   Tommie Sams, DO  B-D ULTRAFINE III SHORT PEN 31G X 8 MM MISC use as directed 09/09/16   Tommie Sams, DO    DULoxetine (CYMBALTA) 60 MG capsule take 2 capsules by mouth once daily 11/04/16   Tommie Sams, DO  gabapentin (NEURONTIN) 300 MG capsule take 1 capsule by mouth three times a day 12/08/16   Reather Littler, MD  metoprolol (LOPRESSOR) 50 MG tablet Take 50 mg by mouth 2 (two) times daily. 12/23/14   Historical Provider, MD  multivitamin (RENA-VIT) TABS tablet Take 1 tablet by mouth daily. 01/09/14   Historical Provider, MD  NOVOLOG FLEXPEN 100 UNIT/ML FlexPen inject 35 units subcutaneously three times a day with food 09/25/16   Reather Littler, MD  ONE TOUCH ULTRA TEST test strip TEST BLOOD SUGAR four times a day 04/14/16   Tommie Sams, DO  pantoprazole (PROTONIX) 40 MG tablet take 1 tablet by mouth once daily 12/08/16   Tommie Sams, DO  predniSONE (DELTASONE) 5 MG tablet Alternate 10 mg one day, 5 mg next, etc Patient taking differently: Take 10 mg by mouth daily with breakfast. Alternate 10 mg one day, 5 mg next, etc 10/02/16   Merwyn Katos, MD  SENSIPAR 30 MG tablet Take 30 mg by mouth at bedtime. Reported on 04/29/2016 12/18/14   Historical Provider, MD  sevelamer carbonate (RENVELA) 800 MG tablet Take 800 mg by mouth 3 (three) times daily. And 2 tablet with snacks    Historical Provider, MD  torsemide (DEMADEX) 20 MG tablet Take 2 tablets (40 mg total) by mouth 2 (two) times daily. 11/11/16 11/11/17  Antonieta Iba, MD  TRESIBA FLEXTOUCH 100 UNIT/ML SOPN FlexTouch Pen inject 40 units subcutaneously once daily 08/25/16   Reather Littler, MD  valsartan (DIOVAN) 80 MG tablet Take 80 mg by mouth at bedtime.  05/29/15   Historical Provider, MD  verapamil (CALAN-SR) 120 MG CR tablet Take 1 tablet (120 mg total) by mouth at bedtime. 10/28/16   Tommie Sams, DO    Allergies Oxycodone-acetaminophen and Tape  Family History  Problem Relation Age of Onset  . Diabetes Mother     died @ 46.  [name] Hypertension Mother   . Hypertension Maternal Grandmother   . Cancer Brother     Social History Social History  Substance  Use Topics  . Smoking status: Never Smoker  . Smokeless tobacco: Never Used  . Alcohol use No    Review of Systems  Constitutional: Positive for generalized weakness. Positive for chills. Eyes: No visual changes. ENT: No sore throat. Cardiovascular: Positive for chest pain. Respiratory: Positive for shortness of breath. Gastrointestinal: No abdominal pain.  Positive for vomiting.  No diarrhea.  No constipation. Genitourinary: Negative for dysuria. Musculoskeletal: Negative for back pain. Skin: Negative for rash. Neurological: Negative for headaches, focal weakness or numbness.  10-point ROS otherwise negative.  ____________________________________________   PHYSICAL EXAM:  VITAL SIGNS: ED Triage Vitals  Enc Vitals Group     BP      Pulse      Resp      Temp      Temp src      SpO2      Weight      Height  Head Circumference      Peak Flow      Pain Score      Pain Loc      Pain Edu?      Excl. in GC?     Constitutional: Unresponsive  Eyes: Conjunctivae are normal. Pupils fixed and dilated. Head: Atraumatic. Nose: No congestion/rhinnorhea. Mouth/Throat: Mucous membranes are moist.  Oropharynx non-erythematous. Neck: No stridor.   Cardiovascular: No spontaneous circulation. Good palpable pulses with chest compressions.  Respiratory: No spontaneous respiratory effort. Bagging. Gastrointestinal: Obese. Soft and nondistended. Musculoskeletal: No deformities. 2+ BLE edema. No joint effusions. Neurologic:  Unresponsive.  Skin:  Skin is cool, cyanotic, dry and intact. No rash noted.  ____________________________________________   LABS (all labs ordered are listed, but only abnormal results are displayed)  Labs Reviewed  GLUCOSE, CAPILLARY - Abnormal; Notable for the following:       Result Value   Glucose-Capillary 166 (*)    All other components within normal limits    ____________________________________________  EKG  None ____________________________________________  RADIOLOGY  None ____________________________________________   PROCEDURES  Procedure(s) performed:   INTUBATION Performed by: Irean Hong  Required items: required blood products, implants, devices, and special equipment available Patient identity confirmed: provided demographic data and hospital-assigned identification number Time out: Immediately prior to procedure a "time out" was called to verify the correct patient, procedure, equipment, support staff and site/side marked as required.  Indications: cardiopulmonary arrest  Intubation method: Glidescope Laryngoscopy   Preoxygenation: BVM  Sedatives: 20mg  Etomidate Paralytic: 250mg  Succinylcholine Medications given for patient with clenched jaw   Tube Size: 7.5 cuffed  Post-procedure assessment: chest rise and ETCO2 monitor Breath sounds: equal and absent over the epigastrium Tube secured with: ETT holder Frothy bloody sputum obtained from ET tube    Procedures  Critical Care performed: Yes, see critical care note(s)   CRITICAL CARE Performed by: Irean Hong   Total critical care time: 30 minutes  Critical care time was exclusive of separately billable procedures and treating other patients.  Critical care was necessary to treat or prevent imminent or life-threatening deterioration.  Critical care was time spent personally by me on the following activities: development of treatment plan with patient and/or surrogate as well as nursing, discussions with consultants, evaluation of patient's response to treatment, examination of patient, obtaining history from patient or surrogate, ordering and performing treatments and interventions, ordering and review of laboratory studies, ordering and review of radiographic studies, pulse oximetry and re-evaluation of patient's  condition.  ____________________________________________   INITIAL IMPRESSION / ASSESSMENT AND PLAN / ED COURSE  Pertinent labs & imaging results that were available during my care of the patient were reviewed by me and considered in my medical decision making (see chart for details).  56 year old male with complex medical history status post dialysis with flulike symptoms, chest pain, shortness of breath nausea and vomiting. Became unresponsive on the way from the lobby back to the treatment room. Found to be pulseless with no spontaneous respirations. CPR immediately initiated. Patient intubated for airway protection. Maximum rounds of resuscitative medicines given (see nursing notes). Patient remained in PEA without recovery of spontaneous circulation. Wife brought back to bedside. Bedside ultrasound demonstrates no cardiac activity. Time of death 40.      ____________________________________________   FINAL CLINICAL IMPRESSION(S) / ED DIAGNOSES  Final diagnoses:  Cardiopulmonary arrest (HCC)      NEW MEDICATIONS STARTED DURING THIS VISIT:  New Prescriptions   No medications on file     Note:  This document was prepared using Dragon voice recognition software and may include unintentional dictation errors.    Irean HongJade J Daniesha Driver, MD 06-18-17 [phone]

## 2017-01-11 NOTE — Code Documentation (Signed)
CPR continued at this time.

## 2017-01-11 NOTE — Code Documentation (Signed)
Pulse check performed at this time.  No femoral pulse noted, pt PEA on monitor.

## 2017-01-11 NOTE — Code Documentation (Signed)
Pulse check performed at this time.  Asystole noted and no pulse at this time.  EDP attempting to intubate patient at this time.

## 2017-01-11 NOTE — ED Notes (Signed)
Funeral home arrived.  Family notified.

## 2017-01-11 DEATH — deceased

## 2017-01-13 ENCOUNTER — Ambulatory Visit: Payer: Medicare Other | Admitting: Pulmonary Disease

## 2017-02-05 ENCOUNTER — Ambulatory Visit: Payer: BC Managed Care – PPO | Admitting: Family

## 2017-05-07 ENCOUNTER — Ambulatory Visit (INDEPENDENT_AMBULATORY_CARE_PROVIDER_SITE_OTHER): Payer: Medicare Other | Admitting: Vascular Surgery

## 2017-05-07 ENCOUNTER — Encounter (INDEPENDENT_AMBULATORY_CARE_PROVIDER_SITE_OTHER): Payer: Medicare Other

## 2017-06-04 ENCOUNTER — Ambulatory Visit: Payer: BC Managed Care – PPO | Admitting: Family Medicine

## 2017-10-28 ENCOUNTER — Ambulatory Visit: Payer: Medicare Other

## 2017-11-05 ENCOUNTER — Encounter: Payer: Medicare Other | Admitting: Family Medicine
